# Patient Record
Sex: Female | Born: 1975
Health system: Southern US, Community
[De-identification: ages and names within clinical notes are randomized; demographics above are authoritative.]

## PROBLEM LIST (undated history)

## (undated) DIAGNOSIS — G709 Myoneural disorder, unspecified: Secondary | ICD-10-CM

## (undated) DIAGNOSIS — N926 Irregular menstruation, unspecified: Secondary | ICD-10-CM

## (undated) DIAGNOSIS — Z8489 Family history of other specified conditions: Secondary | ICD-10-CM

## (undated) DIAGNOSIS — E282 Polycystic ovarian syndrome: Secondary | ICD-10-CM

## (undated) DIAGNOSIS — IMO0002 Reserved for concepts with insufficient information to code with codable children: Secondary | ICD-10-CM

## (undated) DIAGNOSIS — E249 Cushing's syndrome, unspecified: Secondary | ICD-10-CM

## (undated) DIAGNOSIS — R000 Tachycardia, unspecified: Secondary | ICD-10-CM

## (undated) DIAGNOSIS — L03119 Cellulitis of unspecified part of limb: Secondary | ICD-10-CM

## (undated) DIAGNOSIS — F419 Anxiety disorder, unspecified: Secondary | ICD-10-CM

## (undated) DIAGNOSIS — C541 Malignant neoplasm of endometrium: Secondary | ICD-10-CM

## (undated) DIAGNOSIS — I10 Essential (primary) hypertension: Secondary | ICD-10-CM

## (undated) HISTORY — DX: Irregular menstruation, unspecified: N92.6

## (undated) HISTORY — DX: Cushing's syndrome, unspecified: E24.9

## (undated) HISTORY — DX: Polycystic ovarian syndrome: E28.2

## (undated) HISTORY — DX: Tachycardia, unspecified: R00.0

## (undated) HISTORY — DX: Reserved for concepts with insufficient information to code with codable children: IMO0002

## (undated) HISTORY — PX: DILATION AND CURETTAGE OF UTERUS: SHX78

## (undated) HISTORY — PX: ABDOMINAL HYSTERECTOMY: SHX81

## (undated) HISTORY — DX: Malignant neoplasm of endometrium: C54.1

---

## 1998-01-09 DIAGNOSIS — R87619 Unspecified abnormal cytological findings in specimens from cervix uteri: Secondary | ICD-10-CM

## 1998-01-09 DIAGNOSIS — IMO0002 Reserved for concepts with insufficient information to code with codable children: Secondary | ICD-10-CM

## 1998-01-09 HISTORY — DX: Reserved for concepts with insufficient information to code with codable children: IMO0002

## 1998-01-09 HISTORY — DX: Unspecified abnormal cytological findings in specimens from cervix uteri: R87.619

## 2000-07-16 ENCOUNTER — Other Ambulatory Visit: Admission: RE | Admit: 2000-07-16 | Discharge: 2000-07-16 | Payer: Self-pay | Admitting: Obstetrics and Gynecology

## 2001-09-02 ENCOUNTER — Other Ambulatory Visit: Admission: RE | Admit: 2001-09-02 | Discharge: 2001-09-02 | Payer: Self-pay | Admitting: Obstetrics and Gynecology

## 2001-10-09 ENCOUNTER — Emergency Department (HOSPITAL_COMMUNITY): Admission: EM | Admit: 2001-10-09 | Discharge: 2001-10-09 | Payer: Self-pay | Admitting: Emergency Medicine

## 2003-05-25 ENCOUNTER — Other Ambulatory Visit: Admission: RE | Admit: 2003-05-25 | Discharge: 2003-05-25 | Payer: Self-pay | Admitting: Obstetrics and Gynecology

## 2003-07-24 ENCOUNTER — Inpatient Hospital Stay (HOSPITAL_COMMUNITY): Admission: AD | Admit: 2003-07-24 | Discharge: 2003-07-28 | Payer: Self-pay | Admitting: Family Medicine

## 2003-08-03 ENCOUNTER — Encounter: Admission: RE | Admit: 2003-08-03 | Discharge: 2003-08-27 | Payer: Self-pay | Admitting: Family Medicine

## 2003-08-04 ENCOUNTER — Encounter: Admission: RE | Admit: 2003-08-04 | Discharge: 2003-08-04 | Payer: Self-pay | Admitting: Family Medicine

## 2003-08-05 ENCOUNTER — Encounter: Admission: RE | Admit: 2003-08-05 | Discharge: 2003-08-05 | Payer: Self-pay | Admitting: Family Medicine

## 2003-08-17 ENCOUNTER — Encounter: Admission: RE | Admit: 2003-08-17 | Discharge: 2003-08-17 | Payer: Self-pay | Admitting: Family Medicine

## 2003-09-09 ENCOUNTER — Encounter: Admission: RE | Admit: 2003-09-09 | Discharge: 2003-09-09 | Payer: Self-pay | Admitting: Family Medicine

## 2003-12-21 ENCOUNTER — Ambulatory Visit: Payer: Self-pay | Admitting: Family Medicine

## 2004-03-22 ENCOUNTER — Ambulatory Visit: Payer: Self-pay | Admitting: Family Medicine

## 2004-05-13 ENCOUNTER — Ambulatory Visit: Payer: Self-pay | Admitting: Family Medicine

## 2004-05-14 ENCOUNTER — Encounter (INDEPENDENT_AMBULATORY_CARE_PROVIDER_SITE_OTHER): Payer: Self-pay | Admitting: *Deleted

## 2004-12-21 ENCOUNTER — Ambulatory Visit: Payer: Self-pay | Admitting: Family Medicine

## 2005-05-15 ENCOUNTER — Ambulatory Visit: Payer: Self-pay | Admitting: Family Medicine

## 2006-03-08 DIAGNOSIS — J45909 Unspecified asthma, uncomplicated: Secondary | ICD-10-CM | POA: Insufficient documentation

## 2006-03-08 DIAGNOSIS — E282 Polycystic ovarian syndrome: Secondary | ICD-10-CM | POA: Insufficient documentation

## 2006-03-08 DIAGNOSIS — E669 Obesity, unspecified: Secondary | ICD-10-CM | POA: Insufficient documentation

## 2006-03-08 DIAGNOSIS — E739 Lactose intolerance, unspecified: Secondary | ICD-10-CM | POA: Insufficient documentation

## 2006-03-09 ENCOUNTER — Encounter (INDEPENDENT_AMBULATORY_CARE_PROVIDER_SITE_OTHER): Payer: Self-pay | Admitting: *Deleted

## 2009-01-22 ENCOUNTER — Emergency Department (HOSPITAL_COMMUNITY): Admission: EM | Admit: 2009-01-22 | Discharge: 2009-01-22 | Payer: Self-pay | Admitting: Emergency Medicine

## 2009-11-15 ENCOUNTER — Encounter (INDEPENDENT_AMBULATORY_CARE_PROVIDER_SITE_OTHER): Payer: Self-pay | Admitting: Internal Medicine

## 2009-11-15 ENCOUNTER — Inpatient Hospital Stay (HOSPITAL_COMMUNITY)
Admission: EM | Admit: 2009-11-15 | Discharge: 2009-11-20 | Payer: Self-pay | Source: Home / Self Care | Admitting: Emergency Medicine

## 2009-11-16 ENCOUNTER — Encounter (INDEPENDENT_AMBULATORY_CARE_PROVIDER_SITE_OTHER): Payer: Self-pay | Admitting: Internal Medicine

## 2009-11-16 ENCOUNTER — Ambulatory Visit: Payer: Self-pay | Admitting: Surgery

## 2010-03-22 LAB — CBC
HCT: 39.4 % (ref 36.0–46.0)
HCT: 40.1 % (ref 36.0–46.0)
HCT: 40.2 % (ref 36.0–46.0)
HCT: 42.6 % (ref 36.0–46.0)
Hemoglobin: 13 g/dL (ref 12.0–15.0)
Hemoglobin: 13 g/dL (ref 12.0–15.0)
Hemoglobin: 14.5 g/dL (ref 12.0–15.0)
Hemoglobin: 15.9 g/dL — ABNORMAL HIGH (ref 12.0–15.0)
MCHC: 32 g/dL (ref 30.0–36.0)
MCHC: 32.4 g/dL (ref 30.0–36.0)
MCHC: 32.7 g/dL (ref 30.0–36.0)
MCHC: 33.1 g/dL (ref 30.0–36.0)
MCHC: 33.8 g/dL (ref 30.0–36.0)
MCV: 100 fL (ref 78.0–100.0)
MCV: 100.3 fL — ABNORMAL HIGH (ref 78.0–100.0)
MCV: 99.8 fL (ref 78.0–100.0)
Platelets: 231 10*3/uL (ref 150–400)
Platelets: 238 10*3/uL (ref 150–400)
Platelets: 297 10*3/uL (ref 150–400)
Platelets: 326 10*3/uL (ref 150–400)
RBC: 4.03 MIL/uL (ref 3.87–5.11)
RDW: 13.1 % (ref 11.5–15.5)
RDW: 13.1 % (ref 11.5–15.5)
RDW: 13.2 % (ref 11.5–15.5)
RDW: 13.3 % (ref 11.5–15.5)
RDW: 13.4 % (ref 11.5–15.5)
WBC: 10.2 10*3/uL (ref 4.0–10.5)
WBC: 11 10*3/uL — ABNORMAL HIGH (ref 4.0–10.5)
WBC: 8.3 10*3/uL (ref 4.0–10.5)

## 2010-03-22 LAB — GLUCOSE, CAPILLARY
Glucose-Capillary: 180 mg/dL — ABNORMAL HIGH (ref 70–99)
Glucose-Capillary: 203 mg/dL — ABNORMAL HIGH (ref 70–99)
Glucose-Capillary: 210 mg/dL — ABNORMAL HIGH (ref 70–99)
Glucose-Capillary: 222 mg/dL — ABNORMAL HIGH (ref 70–99)
Glucose-Capillary: 232 mg/dL — ABNORMAL HIGH (ref 70–99)
Glucose-Capillary: 233 mg/dL — ABNORMAL HIGH (ref 70–99)
Glucose-Capillary: 234 mg/dL — ABNORMAL HIGH (ref 70–99)
Glucose-Capillary: 239 mg/dL — ABNORMAL HIGH (ref 70–99)
Glucose-Capillary: 284 mg/dL — ABNORMAL HIGH (ref 70–99)
Glucose-Capillary: 288 mg/dL — ABNORMAL HIGH (ref 70–99)

## 2010-03-22 LAB — CULTURE, BLOOD (ROUTINE X 2)
Culture  Setup Time: 201111072321
Culture  Setup Time: 201111072323
Culture  Setup Time: 201111100604
Culture: NO GROWTH

## 2010-03-22 LAB — BASIC METABOLIC PANEL
BUN: 5 mg/dL — ABNORMAL LOW (ref 6–23)
BUN: 6 mg/dL (ref 6–23)
BUN: 6 mg/dL (ref 6–23)
BUN: 6 mg/dL (ref 6–23)
CO2: 29 mEq/L (ref 19–32)
Calcium: 8.7 mg/dL (ref 8.4–10.5)
Calcium: 9.8 mg/dL (ref 8.4–10.5)
Chloride: 102 mEq/L (ref 96–112)
Creatinine, Ser: 0.64 mg/dL (ref 0.4–1.2)
Creatinine, Ser: 0.7 mg/dL (ref 0.4–1.2)
Creatinine, Ser: 0.73 mg/dL (ref 0.4–1.2)
GFR calc Af Amer: >60 mL/min (ref >60)
GFR calc Af Amer: >60 mL/min (ref >60)
GFR calc non Af Amer: >60 mL/min (ref >60)
GFR calc non Af Amer: >60 mL/min (ref >60)
GFR calc non Af Amer: >60 mL/min (ref >60)
GFR calc non Af Amer: >60 mL/min (ref >60)
Glucose, Bld: 229 mg/dL — ABNORMAL HIGH (ref 70–99)
Glucose, Bld: 251 mg/dL — ABNORMAL HIGH (ref 70–99)
Glucose, Bld: 258 mg/dL — ABNORMAL HIGH (ref 70–99)
Glucose, Bld: 379 mg/dL — ABNORMAL HIGH (ref 70–99)
Potassium: 4.2 mEq/L (ref 3.5–5.1)
Potassium: 4.3 mEq/L (ref 3.5–5.1)
Potassium: 4.8 mEq/L (ref 3.5–5.1)
Sodium: 134 mEq/L — ABNORMAL LOW (ref 135–145)
Sodium: 139 mEq/L (ref 135–145)

## 2010-03-22 LAB — HEMOGLOBIN A1C
Hgb A1c MFr Bld: 10.4 % — ABNORMAL HIGH (ref <5.7)
Mean Plasma Glucose: 252 mg/dL — ABNORMAL HIGH (ref <117)

## 2010-03-22 LAB — DIFFERENTIAL
Basophils Absolute: 0 10*3/uL (ref 0.0–0.1)
Basophils Relative: 0 % (ref 0–1)
Monocytes Relative: 7 % (ref 3–12)
Neutro Abs: 9 10*3/uL — ABNORMAL HIGH (ref 1.7–7.7)
Neutrophils Relative %: 79 % — ABNORMAL HIGH (ref 43–77)

## 2010-03-22 LAB — URINALYSIS, ROUTINE W REFLEX MICROSCOPIC
Bilirubin Urine: NEGATIVE
Glucose, UA: >1000 mg/dL — AB
Ketones, ur: NEGATIVE mg/dL
Leukocytes, UA: NEGATIVE
Nitrite: NEGATIVE
Specific Gravity, Urine: 1.014 (ref 1.005–1.030)
pH: 6 (ref 5.0–8.0)

## 2010-03-22 LAB — HEPATIC FUNCTION PANEL
Bilirubin, Direct: <0.1 mg/dL (ref 0.0–0.3)
Total Bilirubin: 0.4 mg/dL (ref 0.3–1.2)

## 2010-03-22 LAB — URINE MICROSCOPIC-ADD ON

## 2010-03-22 LAB — SEDIMENTATION RATE: Sed Rate: 80 mm/hr — ABNORMAL HIGH (ref 0–22)

## 2010-03-22 LAB — LIPID PANEL
LDL Cholesterol: 123 mg/dL — ABNORMAL HIGH (ref 0–99)
Triglycerides: 231 mg/dL — ABNORMAL HIGH (ref <150)
VLDL: 46 mg/dL — ABNORMAL HIGH (ref 0–40)

## 2010-03-27 LAB — BASIC METABOLIC PANEL
Calcium: 9.4 mg/dL (ref 8.4–10.5)
Creatinine, Ser: 0.79 mg/dL (ref 0.4–1.2)
GFR calc Af Amer: >60 mL/min (ref >60)
GFR calc non Af Amer: >60 mL/min (ref >60)

## 2010-05-27 NOTE — Discharge Summary (Signed)
NAME:  Kelli Allen, NIEMAN NO.:  [id number]   MEDICAL RECORD NO.:  [id number]                   PATIENT TYPE:  INP   LOCATION:  5011                                 FACILITY:  [facility]   PHYSICIAN:  Ace Gins, MD                  DATE OF BIRTH:  20-Jul-1975   DATE OF ADMISSION:  07/24/2003  DATE OF DISCHARGE:  07/28/2003                                 DISCHARGE SUMMARY   Dictated by Towana Badger, acting intern for Dr. Larina Bras, [facility].   DISCHARGE DIAGNOSES:  1. Right lower extremity cellulitis.  2. Non-insulin-dependent diabetes mellitus.   DISCHARGE MEDICATIONS:  1. Augmentin 875 mg/125 mg p.o. q.12h. with food X7 days.  2. Metformin 500 mg p.o. twice a day.  3. Enalapril 5 mg p.o. daily.   FOLLOW UP:  1. Appointment scheduled with Dr. Larina Bras in the family medicine clinic for     2:00 P.M. on August 05, 2003.  2. Evaluation of right lower extremity cellulitis.  3. Diabetes education for newly diagnosed non-insulin-dependent diabetes     mellitus.   HOSPITAL COURSE:  This patient is a 35 year old white lady who presented to  the emergency room with right lower extremity redness and swelling x3 days.  She was diagnosed with cellulitis and treated with intravenous amoxicillin.  Incidental to this admission was the finding of elevated blood glucose.  In  discussion with the patient, it was discovered that the patient had a prior  diagnosis of pre-diabetes with recommendations from her primary medical  doctor one year ago to begin oral hypoglycemics.  At this time the patient  was started on metformin oral therapy and put on an insulin sliding scale  for coverage while in the hospital.  The patient showed improvement in the  extent of erythema and the degree of pain surrounding her right lower  extremity lesion and was changed over to p.o. antibiotics for discharge and  follow up with the family medicine clinic.  The  patient was discharged  afebrile and hemodynamically stable with no complaints.   DISCHARGE LABORATORY DATA:  CBC:  White blood cell count 7.1, hemoglobin  13.2, hematocrit 38.4, platelet count 292,000.  BMET:  Sodium 138, potassium  4.0, chloride 107, cO2 25, BUN 8, creatinine 0.8, glucose 113, calcium 9.2.   DISCHARGE INSTRUCTIONS:  1. Medications:  See discharge medications above.  2. Pain management:  Over the counter Tylenol or ibuprofen as needed, per     instructions on bottle.  3. Activity:  The patient was recommended to ambulate as able.  She may     return to work after several days of rest and recuperation, as able.  4. Diet:  Diet per diabetes educator.  5. Wound care:  No scratching or excessive manipulation of the skin.     Elevate leg if swelling increases.  6. Special instructions:  None.  7. Follow  up:  As noted above, patient has a follow up appointment with Dr.     Larina Bras for August 05, 2003 at 2:00 P.M. here at the family  medicine clinic.     Patient was further instructed to return to the emergency department if     new onset fever or increased pain, swelling or redness of the affected     limb.      Rodolph Bong, M.D.                        Ace Gins, MD    AK/MEDQ  D:  07/28/2003  T:  07/28/2003  Job:  [id number]

## 2010-10-11 ENCOUNTER — Emergency Department (HOSPITAL_BASED_OUTPATIENT_CLINIC_OR_DEPARTMENT_OTHER)
Admission: EM | Admit: 2010-10-11 | Discharge: 2010-10-11 | Disposition: A | Discharge status: Home / Self Care | Payer: Managed Care, Other (non HMO) | Attending: Emergency Medicine | Admitting: Emergency Medicine

## 2010-10-11 ENCOUNTER — Encounter: Payer: Self-pay | Admitting: *Deleted

## 2010-10-11 DIAGNOSIS — E119 Type 2 diabetes mellitus without complications: Secondary | ICD-10-CM | POA: Insufficient documentation

## 2010-10-11 DIAGNOSIS — N898 Other specified noninflammatory disorders of vagina: Secondary | ICD-10-CM | POA: Insufficient documentation

## 2010-10-11 DIAGNOSIS — I10 Essential (primary) hypertension: Secondary | ICD-10-CM

## 2010-10-11 DIAGNOSIS — N939 Abnormal uterine and vaginal bleeding, unspecified: Secondary | ICD-10-CM

## 2010-10-11 DIAGNOSIS — Z794 Long term (current) use of insulin: Secondary | ICD-10-CM | POA: Insufficient documentation

## 2010-10-11 DIAGNOSIS — R000 Tachycardia, unspecified: Secondary | ICD-10-CM | POA: Insufficient documentation

## 2010-10-11 LAB — URINALYSIS, ROUTINE W REFLEX MICROSCOPIC
Bilirubin Urine: NEGATIVE
Glucose, UA: >1000 mg/dL — AB
Hgb urine dipstick: NEGATIVE
Ketones, ur: 15 mg/dL — AB
Leukocytes, UA: NEGATIVE
Nitrite: NEGATIVE
Protein, ur: NEGATIVE mg/dL
Specific Gravity, Urine: 1.031 — ABNORMAL HIGH (ref 1.005–1.030)
Urobilinogen, UA: 0.2 mg/dL (ref 0.0–1.0)
pH: 6 (ref 5.0–8.0)

## 2010-10-11 LAB — CBC
HCT: 43.5 % (ref 36.0–46.0)
Hemoglobin: 14.7 g/dL (ref 12.0–15.0)
MCH: 32.7 pg (ref 26.0–34.0)
MCHC: 33.8 g/dL (ref 30.0–36.0)
MCV: 96.9 fL (ref 78.0–100.0)
RDW: 13.7 % (ref 11.5–15.5)

## 2010-10-11 LAB — WET PREP, GENITAL
Trich, Wet Prep: NONE SEEN
Yeast Wet Prep HPF POC: NONE SEEN

## 2010-10-11 LAB — URINE MICROSCOPIC-ADD ON

## 2010-10-11 LAB — PREGNANCY, URINE: Preg Test, Ur: NEGATIVE

## 2010-10-11 LAB — GLUCOSE, CAPILLARY

## 2010-10-11 NOTE — ED Notes (Signed)
Called to triage by registration for family complaint.

## 2010-10-11 NOTE — ED Provider Notes (Signed)
History     CSN: [id number] Arrival date & time: 10/11/2010  8:41 PM  Chief Complaint  Patient presents with  . Vaginal Bleeding    (Consider location/radiation/quality/duration/timing/severity/associated sxs/prior treatment) HPI Comments: Pt states that she has heavy bleeding for a week that ended 2 days ago:pt states that she had clots and tissue and she was talking to some friends tonight and she became worried that she may have misscarried:pt states that she has been seen having problems with her bp and heart rate and she was put on procardia last week and she is to see them again next week  Patient is a 35 y.o. female presenting with vaginal bleeding. The history is provided by the patient. No language interpreter was used.  Vaginal Bleeding This is a new problem. The current episode started in the past 7 days. The problem has been resolved. Pertinent negatives include no abdominal pain, fever or urinary symptoms. The symptoms are aggravated by nothing. She has tried nothing for the symptoms.    Past Medical History  Diagnosis Date  . Diabetes mellitus   . Polycystic ovarian syndrome     History reviewed. No pertinent past surgical history.  No family history on file.  History  Substance Use Topics  . Smoking status: Never Smoker   . Smokeless tobacco: Not on file  . Alcohol Use: No    OB History    Grav Para Term Preterm Abortions TAB SAB Ect Mult Living                  Review of Systems  Constitutional: Negative for fever.  Gastrointestinal: Negative for abdominal pain.  Genitourinary: Positive for vaginal bleeding.  All other systems reviewed and are negative.    Allergies  Review of patient's allergies indicates not on file.  Home Medications   Current Outpatient Rx  Name Route Sig Dispense Refill  . INSULIN ASPART PROT & ASPART (70-30) 100 UNIT/ML [place] SUSP Subcutaneous Inject into the skin daily with breakfast.      . INSULIN GLARGINE 100 UNIT/ML [place]  SOLN Subcutaneous Inject 55 Units into the skin at bedtime.      Marland Kitchen METFORMIN HCL 500 MG (OSM) PO TB24 Oral Take 500 mg by mouth 2 (two) times daily with a meal.      . NIFEDIPINE CR OSMOTIC 60 MG PO TB24 Oral Take 60 mg by mouth daily.        BP 154/87  Pulse 120  Temp(Src) 98.4 F (36.9 C) (Oral)  Resp 20  SpO2 100%  Physical Exam  Nursing note and vitals reviewed. Constitutional: She is oriented to person, place, and time. She appears well-developed and well-nourished.  HENT:  Head: Normocephalic and atraumatic.  Eyes: Pupils are equal, round, and reactive to light.  Neck: Normal range of motion.  Cardiovascular: Normal rate and regular rhythm.   Pulmonary/Chest: Effort normal and breath sounds normal.  Abdominal: Soft. Bowel sounds are normal.  Musculoskeletal: Normal range of motion.  Neurological: She is alert and oriented to person, place, and time.  Skin: Skin is warm and dry.  Psychiatric: She has a normal mood and affect.    ED Course  Procedures (including critical care time)  Labs Reviewed  URINALYSIS, ROUTINE W REFLEX MICROSCOPIC - Abnormal; Notable for the following:    Specific Gravity, Urine 1.031 (*)    Glucose, UA >1000 (*)    Ketones, ur 15 (*)    All other components within normal limits  WET PREP,  GENITAL - Abnormal; Notable for the following:    Clue Cells, Wet Prep MODERATE (*)    WBC, Wet Prep HPF POC MANY (*)    All other components within normal limits  GLUCOSE, CAPILLARY - Abnormal; Notable for the following:    Glucose-Capillary 265 (*)    All other components within normal limits  CBC - Abnormal; Notable for the following:    WBC 11.9 (*)    All other components within normal limits  PREGNANCY, URINE  URINE MICROSCOPIC-ADD ON  GC/CHLAMYDIA PROBE AMP, GENITAL  POCT CBG MONITORING   No results found.   No diagnosis found.    MDM  Pt heart rate has been this at baseline per pt and she has been on the procardia and is to be rechecked  by pcp next week:discussed with pt that there was no way to know if she miscarried with certainty but her pregnancy test is negative and that means that even if she did the products have cleared        Teressa Lower, NP 10/18/10 1347

## 2010-10-11 NOTE — ED Notes (Signed)
Pt. 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 Was passing "tissue" off and on with her more heavier than normal period.  Pt.reports the NSG instructor told the students/friends to bring her to the ED immediately.  Pt. In no distress and taking her meds as ordered.

## 2010-10-11 NOTE — ED Notes (Signed)
Pt. In no distress.  Pt. Vitals retaken to assure the Heart rate and B/P are WNL.  NO noted shortness of breath in Pt.  Pt. Reports to RN she has been on a trip over the wk end.  Pt. Reports her

## 2010-10-11 NOTE — ED Notes (Signed)
States she thinks she is having a miscarriage. Has not had a positive pregnancy test. Heavy vaginal bleeding x 1 week. Stopped yesterday.

## 2010-10-11 NOTE — ED Notes (Signed)
Pt. Has noted edema look to eyelids bilat.  Pt. Also has large face with noted edematous look

## 2010-10-13 LAB — GC/CHLAMYDIA PROBE AMP, GENITAL
Chlamydia, DNA Probe: NEGATIVE
GC Probe Amp, Genital: NEGATIVE

## 2010-10-20 NOTE — ED Provider Notes (Signed)
Medical screening examination/treatment/procedure(s) were performed by non-physician practitioner and as supervising physician I was immediately available for consultation/collaboration.  Cyndra Numbers, MD 10/20/10 [phone]

## 2011-02-25 ENCOUNTER — Emergency Department (HOSPITAL_BASED_OUTPATIENT_CLINIC_OR_DEPARTMENT_OTHER)
Admission: EM | Admit: 2011-02-25 | Discharge: 2011-02-25 | Disposition: A | Discharge status: Home / Self Care | Payer: PRIVATE HEALTH INSURANCE | Source: Home / Self Care | Attending: Emergency Medicine | Admitting: Emergency Medicine

## 2011-02-25 ENCOUNTER — Encounter (HOSPITAL_BASED_OUTPATIENT_CLINIC_OR_DEPARTMENT_OTHER): Payer: Self-pay | Admitting: *Deleted

## 2011-02-25 DIAGNOSIS — E669 Obesity, unspecified: Secondary | ICD-10-CM | POA: Insufficient documentation

## 2011-02-25 DIAGNOSIS — Z9119 Patient's noncompliance with other medical treatment and regimen: Secondary | ICD-10-CM | POA: Insufficient documentation

## 2011-02-25 DIAGNOSIS — L02419 Cutaneous abscess of limb, unspecified: Secondary | ICD-10-CM | POA: Insufficient documentation

## 2011-02-25 DIAGNOSIS — Z79899 Other long term (current) drug therapy: Secondary | ICD-10-CM | POA: Insufficient documentation

## 2011-02-25 DIAGNOSIS — Z91199 Patient's noncompliance with other medical treatment and regimen due to unspecified reason: Secondary | ICD-10-CM | POA: Insufficient documentation

## 2011-02-25 DIAGNOSIS — L0390 Cellulitis, unspecified: Secondary | ICD-10-CM

## 2011-02-25 DIAGNOSIS — E119 Type 2 diabetes mellitus without complications: Secondary | ICD-10-CM | POA: Insufficient documentation

## 2011-02-25 HISTORY — DX: Cellulitis of unspecified part of limb: L03.119

## 2011-02-25 LAB — URINE MICROSCOPIC-ADD ON

## 2011-02-25 LAB — URINALYSIS, ROUTINE W REFLEX MICROSCOPIC
Bilirubin Urine: NEGATIVE
Glucose, UA: >1000 mg/dL — AB
Hgb urine dipstick: NEGATIVE
Ketones, ur: 15 mg/dL — AB
Protein, ur: NEGATIVE mg/dL
Urobilinogen, UA: 0.2 mg/dL (ref 0.0–1.0)

## 2011-02-25 LAB — BASIC METABOLIC PANEL
CO2: 26 mEq/L (ref 19–32)
Chloride: 95 mEq/L — ABNORMAL LOW (ref 96–112)
Creatinine, Ser: 0.7 mg/dL (ref 0.50–1.10)
GFR calc Af Amer: >90 mL/min (ref >90)
Sodium: 132 mEq/L — ABNORMAL LOW (ref 135–145)

## 2011-02-25 LAB — PREGNANCY, URINE: Preg Test, Ur: NEGATIVE

## 2011-02-25 LAB — GLUCOSE, CAPILLARY: Glucose-Capillary: 279 mg/dL — ABNORMAL HIGH (ref 70–99)

## 2011-02-25 MED ORDER — SODIUM CHLORIDE 0.9 % IV BOLUS (SEPSIS)
1000.0000 mL | Freq: Once | INTRAVENOUS | Status: AC
  Administered 2011-02-25: 1000 mL via INTRAVENOUS

## 2011-02-25 MED ORDER — VANCOMYCIN HCL IN DEXTROSE 1-5 GM/200ML-% IV SOLN
1000.0000 mg | Freq: Once | INTRAVENOUS | Status: AC
  Administered 2011-02-25: 1000 mg via INTRAVENOUS
  Filled 2011-02-25: qty 200

## 2011-02-25 MED ORDER — MORPHINE SULFATE 4 MG/ML IJ SOLN
4.0000 mg | Freq: Once | INTRAMUSCULAR | Status: AC
  Administered 2011-02-25: 4 mg via INTRAVENOUS
  Filled 2011-02-25: qty 1

## 2011-02-25 MED ORDER — PIPERACILLIN-TAZOBACTAM 3.375 G IVPB
3.3750 g | Freq: Once | INTRAVENOUS | Status: AC
  Administered 2011-02-25: 3.375 g via INTRAVENOUS

## 2011-02-25 MED ORDER — AMOXICILLIN-POT CLAVULANATE 875-125 MG PO TABS: 1.0000 | ORAL_TABLET | Freq: Two times a day (BID) | ORAL | Status: DC

## 2011-02-25 MED ORDER — HYDROCODONE-ACETAMINOPHEN 5-325 MG PO TABS: 2.0000 | ORAL_TABLET | ORAL | Status: AC | PRN

## 2011-02-25 MED ORDER — ACETAMINOPHEN 500 MG PO TABS
1000.0000 mg | ORAL_TABLET | Freq: Once | ORAL | Status: AC
  Administered 2011-02-25: 1000 mg via ORAL
  Filled 2011-02-25: qty 2

## 2011-02-25 MED ORDER — PIPERACILLIN-TAZOBACTAM 3.375 G IVPB
3.3750 g | Freq: Once | INTRAVENOUS | Status: DC
  Filled 2011-02-25: qty 50

## 2011-02-25 NOTE — ED Notes (Signed)
Pt states she has a hx of cellulitis and "knows this is the same". Past hx of virus which led to vomiting which led to 4 day hospitalization with cellulitis. "sugar up d/t not taking Lantus for 4 days-unable to get" 256 this a.m. Right lower leg red and warm to touch.

## 2011-02-25 NOTE — ED Notes (Signed)
Dr Ethelda Chick to reassess the patient.

## 2011-02-25 NOTE — ED Notes (Signed)
MD at bedside. 

## 2011-02-25 NOTE — ED Notes (Signed)
Skin marked to show area of cellulitis inflammation

## 2011-02-25 NOTE — ED Provider Notes (Signed)
History    Scribed for Doug Sou, MD, the patient was seen in room MH06/MH06. This chart was scribed by Katha Cabal.   CSN: [id number]  Arrival date & time 02/25/11  1604   First MD Initiated Contact with Patient 02/25/11 1807      Chief Complaint  Patient presents with  . Leg Pain    (Consider location/radiation/quality/duration/timing/severity/associated sxs/prior treatment) HPI Kelli Allen is a 36 y.o. female who presents to the Emergency Department complaining of acute onset of right lower extremity cellulitis that is gradual worsening.  Patient reports tenderness down the leg. Erythema began about 2 PM this afternoon around toes.  Pain rated 5/10.   Pain described as stinging.  Symptoms are associated with chills.  Symptoms are not associated with fever.    Patient states she was hurting in groin earlier today.  ASA taken for pain.  Patient reports similar symptoms with prior cellulitis episodes.  Patient with diabetes mellitus but missed [place] dosage the last 4 days because she didn't go pick up medication.  Patient taking Metformin and beta blocker.   Patient reports blood sugar was 256 this morning and has been running about 160 fasting.     Past Medical History  Diagnosis Date  . Diabetes mellitus   . Polycystic ovarian syndrome   . Cellulitis of lower leg     History reviewed. No pertinent past surgical history.  History reviewed. No pertinent family history.  History  Substance Use Topics  . Smoking status: Never Smoker   . Smokeless tobacco: Not on file  . Alcohol Use: Yes, occasionally     OB History    Grav Para Term Preterm Abortions TAB SAB Ect Mult Living                  Review of Systems  Constitutional: Negative.   HENT: Negative.   Respiratory: Negative.   Cardiovascular: Negative.   Gastrointestinal: Negative.   Musculoskeletal: Negative.        Leg pain   Skin: Negative.   Neurological: Negative.   Hematological: Negative.     Psychiatric/Behavioral: Negative.     Allergies  Review of patient's allergies indicates no known allergies.  Home Medications   Current Outpatient Rx  Name Route Sig Dispense Refill  . INSULIN ASPART PROT & ASPART (70-30) 100 UNIT/ML [place] SUSP Subcutaneous Inject 30 Units into the skin daily with breakfast.     . INSULIN GLARGINE 100 UNIT/ML [place] SOLN Subcutaneous Inject 60 Units into the skin at bedtime.     Marland Kitchen LABETALOL HCL 200 MG PO TABS Oral Take 200 mg by mouth 2 (two) times daily.    Marland Kitchen METFORMIN HCL ER (OSM) 500 MG PO TB24 Oral Take 500 mg by mouth 2 (two) times daily with a meal.      . OVER THE COUNTER MEDICATION Oral Take 1 packet by mouth daily. Nutralite Vitamins      BP 146/63  Pulse 124  Temp(Src) 101.2 F (38.4 C) (Oral)  Resp 19  Ht 5\' 5"  (1.651 m)  Wt 290 lb (131.543 kg)  BMI 48.26 kg/m2  SpO2 96%  LMP 02/24/2011  Physical Exam  Nursing note and vitals reviewed. Constitutional: She appears well-developed and well-nourished.  HENT:  Head: Normocephalic and atraumatic.  Mouth/Throat: Mucous membranes are dry.  Eyes: Conjunctivae are normal. Pupils are equal, round, and reactive to light.  Neck: Neck supple. No tracheal deviation present. No thyromegaly present.  Cardiovascular: Regular rhythm.  Tachycardia present.  No murmur heard. Pulmonary/Chest: Effort normal and breath sounds normal. No respiratory distress. She has no wheezes. She has no rales.  Abdominal: Soft. Bowel sounds are normal. She exhibits no distension. There is no tenderness.       obese  Musculoskeletal: Normal range of motion. She exhibits no edema and no tenderness.       Right lower extremity has 20 cm area of redness and warmth, stocking glove fashion around calf and shin, thigh is minimally tender  Neurological: She is alert. Coordination normal.  Skin: Skin is warm and dry. No rash noted.  Psychiatric: She has a normal mood and affect.    ED Course  Procedures (including critical  care time)   DIAGNOSTIC STUDIES: Oxygen Saturation is 97% on room air, normal by my interpretation.     COORDINATION OF CARE: 6:20 PM  Physical exam complete.   6:30 PM  Vancomycin, Zosyn, Morphine and IV fluids.   6:45 PM  IV fluid bolus.  9:30 PM  Recheck.  Patient is feeling better.  Redness at has not worsened.  Plan to discharge patient.  Patient agrees with plan.       LABS / RADIOLOGY:   Labs Reviewed  URINALYSIS, ROUTINE W REFLEX MICROSCOPIC - Abnormal; Notable for the following:    Glucose, UA >1000 (*)    Ketones, ur 15 (*)    All other components within normal limits  GLUCOSE, CAPILLARY - Abnormal; Notable for the following:    Glucose-Capillary 349 (*)    All other components within normal limits  BASIC METABOLIC PANEL - Abnormal; Notable for the following:    Sodium 132 (*)    Chloride 95 (*)    Glucose, Bld 333 (*)    Calcium 10.7 (*)    All other components within normal limits  GLUCOSE, CAPILLARY - Abnormal; Notable for the following:    Glucose-Capillary 279 (*)    All other components within normal limits  PREGNANCY, URINE  URINE MICROSCOPIC-ADD ON   No results found.   No diagnosis found.    MDM  Note elevated pulse is baseline for this patient she reports that she has a resting tachycardia at baseline. other contributing factor likely is a fever. Patient reports she has done well with Augmentin in the past for cellulitis. She will get her prescription for insulin fell tonight and begin to take it tonight.. Plan patient to return tomorrow for reexamination. If condition worsens we'll hospitalize Diagnosis #1 cellulitis of right leg #2 hyperglycemia #3 medication noncompliance   I personally performed the services described in this documentation, which was scribed in my presence. The recorded information has been reviewed and considered.  Doug Sou, MD 02/25/11 2139

## 2011-02-25 NOTE — ED Notes (Signed)
I completed CBG check and got reading of 279 mg./dcltr.

## 2011-02-25 NOTE — Discharge Instructions (Signed)
Cellulitis Cellulitis is an infection of the tissue under the skin. The infected area is usually red and tender. This is caused by germs. These germs enter the body through cuts or sores. This usually happens in the arms or lower legs. HOME CARE   Take your medicine as told. Finish it even if you start to feel better.   If the infection is on the arm or leg, keep it raised (elevated).   Use a warm cloth on the infected area several times a day.   See your doctor for a follow-up visit as told.  GET HELP RIGHT AWAY IF:   You are tired or confused.   You throw up (vomit).   You have watery poop (diarrhea).   You feel ill and have muscle aches.   You have a fever.  MAKE SURE YOU:   Understand these instructions.   Will watch your condition.   Will get help right away if you are not doing well or get worse.  Document Released: 06/14/2007 Document Revised: 09/07/2010 Document Reviewed: 11/27/2008 [facility] Patient Information 2012 [place], [place].  Return tomorrow for reexamination. Take Tylenol for mild pain or pain the medication prescribed for bad pain. Return sooner if your condition worsens for any reason. Take your insulin as prescribed

## 2011-02-26 ENCOUNTER — Encounter (HOSPITAL_BASED_OUTPATIENT_CLINIC_OR_DEPARTMENT_OTHER): Payer: Self-pay | Admitting: *Deleted

## 2011-02-26 ENCOUNTER — Inpatient Hospital Stay (HOSPITAL_BASED_OUTPATIENT_CLINIC_OR_DEPARTMENT_OTHER)
Admission: EM | Admit: 2011-02-26 | Discharge: 2011-03-02 | DRG: 603 | Disposition: A | Discharge status: Home / Self Care | Payer: PRIVATE HEALTH INSURANCE | Attending: Internal Medicine | Admitting: Internal Medicine

## 2011-02-26 DIAGNOSIS — Z6841 Body Mass Index (BMI) 40.0 and over, adult: Secondary | ICD-10-CM

## 2011-02-26 DIAGNOSIS — E282 Polycystic ovarian syndrome: Secondary | ICD-10-CM | POA: Diagnosis present

## 2011-02-26 DIAGNOSIS — I10 Essential (primary) hypertension: Secondary | ICD-10-CM | POA: Diagnosis present

## 2011-02-26 DIAGNOSIS — L259 Unspecified contact dermatitis, unspecified cause: Secondary | ICD-10-CM | POA: Diagnosis present

## 2011-02-26 DIAGNOSIS — Z79899 Other long term (current) drug therapy: Secondary | ICD-10-CM

## 2011-02-26 DIAGNOSIS — IMO0001 Reserved for inherently not codable concepts without codable children: Secondary | ICD-10-CM | POA: Diagnosis present

## 2011-02-26 DIAGNOSIS — Z794 Long term (current) use of insulin: Secondary | ICD-10-CM

## 2011-02-26 DIAGNOSIS — L03119 Cellulitis of unspecified part of limb: Secondary | ICD-10-CM

## 2011-02-26 DIAGNOSIS — L02419 Cutaneous abscess of limb, unspecified: Principal | ICD-10-CM | POA: Diagnosis present

## 2011-02-26 DIAGNOSIS — E119 Type 2 diabetes mellitus without complications: Secondary | ICD-10-CM | POA: Diagnosis present

## 2011-02-26 DIAGNOSIS — J45909 Unspecified asthma, uncomplicated: Secondary | ICD-10-CM | POA: Diagnosis present

## 2011-02-26 DIAGNOSIS — E782 Mixed hyperlipidemia: Secondary | ICD-10-CM | POA: Diagnosis present

## 2011-02-26 HISTORY — DX: Essential (primary) hypertension: I10

## 2011-02-26 LAB — GLUCOSE, CAPILLARY: Glucose-Capillary: 245 mg/dL — ABNORMAL HIGH (ref 70–99)

## 2011-02-26 LAB — BASIC METABOLIC PANEL
BUN: 10 mg/dL (ref 6–23)
Chloride: 100 mEq/L (ref 96–112)
GFR calc Af Amer: >90 mL/min (ref >90)
GFR calc non Af Amer: >90 mL/min (ref >90)
Potassium: 4.1 mEq/L (ref 3.5–5.1)
Sodium: 136 mEq/L (ref 135–145)

## 2011-02-26 LAB — DIFFERENTIAL
Band Neutrophils: 8 % (ref 0–10)
Basophils Absolute: 0 10*3/uL (ref 0.0–0.1)
Basophils Relative: 0 % (ref 0–1)
Eosinophils Absolute: 0 10*3/uL (ref 0.0–0.7)
Eosinophils Relative: 0 % (ref 0–5)
Metamyelocytes Relative: 0 %
Myelocytes: 0 %
Neutro Abs: 15 10*3/uL — ABNORMAL HIGH (ref 1.7–7.7)
Neutrophils Relative %: 84 % — ABNORMAL HIGH (ref 43–77)
Promyelocytes Absolute: 0 %

## 2011-02-26 LAB — CBC
Hemoglobin: 14 g/dL (ref 12.0–15.0)
MCH: 33.2 pg (ref 26.0–34.0)
MCHC: 33.7 g/dL (ref 30.0–36.0)
MCV: 98.3 fL (ref 78.0–100.0)
RBC: 4.22 MIL/uL (ref 3.87–5.11)

## 2011-02-26 MED ORDER — PIPERACILLIN-TAZOBACTAM 3.375 G IVPB
3.3750 g | Freq: Once | INTRAVENOUS | Status: AC
  Administered 2011-02-26: 3.375 g via INTRAVENOUS
  Filled 2011-02-26: qty 50

## 2011-02-26 MED ORDER — SODIUM CHLORIDE 0.9 % IV SOLN
1000.0000 mL | INTRAVENOUS | Status: DC
  Administered 2011-02-26: 1000 mL via INTRAVENOUS

## 2011-02-26 MED ORDER — ONDANSETRON HCL 4 MG/2ML IJ SOLN
4.0000 mg | Freq: Once | INTRAMUSCULAR | Status: DC
  Filled 2011-02-26: qty 2

## 2011-02-26 MED ORDER — ACETAMINOPHEN 325 MG PO TABS: 650.0000 mg | ORAL_TABLET | Freq: Four times a day (QID) | ORAL | Status: DC | PRN

## 2011-02-26 MED ORDER — SODIUM CHLORIDE 0.9 % IV SOLN
INTRAVENOUS | Status: DC
  Administered 2011-02-27: 12:00:00 via INTRAVENOUS

## 2011-02-26 MED ORDER — INSULIN ASPART PROT & ASPART (70-30 MIX) 100 UNIT/ML ~~LOC~~ SUSP
30.0000 [IU] | Freq: Every day | SUBCUTANEOUS | Status: DC
  Administered 2011-02-27 – 2011-02-28 (×2): 30 [IU] via SUBCUTANEOUS
  Filled 2011-02-26: qty 3

## 2011-02-26 MED ORDER — ACETAMINOPHEN 325 MG PO TABS
650.0000 mg | ORAL_TABLET | Freq: Four times a day (QID) | ORAL | Status: DC | PRN
  Administered 2011-02-26: 650 mg via ORAL
  Filled 2011-02-26: qty 2

## 2011-02-26 MED ORDER — HYDROMORPHONE HCL PF 1 MG/ML IJ SOLN: 0.5000 mg | INTRAMUSCULAR | Status: DC | PRN

## 2011-02-26 MED ORDER — ENOXAPARIN SODIUM 40 MG/0.4ML ~~LOC~~ SOLN
40.0000 mg | SUBCUTANEOUS | Status: DC
Start: 2011-02-27 — End: 2011-03-02
  Administered 2011-02-27 – 2011-03-02 (×4): 40 mg via SUBCUTANEOUS
  Filled 2011-02-26 (×4): qty 0.4

## 2011-02-26 MED ORDER — VANCOMYCIN HCL IN DEXTROSE 1-5 GM/200ML-% IV SOLN
1000.0000 mg | Freq: Once | INTRAVENOUS | Status: AC
  Administered 2011-02-26: 1000 mg via INTRAVENOUS
  Filled 2011-02-26: qty 200

## 2011-02-26 MED ORDER — HYDRALAZINE HCL 20 MG/ML IJ SOLN
10.0000 mg | INTRAMUSCULAR | Status: DC | PRN
  Filled 2011-02-26: qty 0.5

## 2011-02-26 MED ORDER — ONDANSETRON HCL 4 MG/2ML IJ SOLN: 4.0000 mg | Freq: Four times a day (QID) | INTRAMUSCULAR | Status: DC | PRN

## 2011-02-26 MED ORDER — LABETALOL HCL 200 MG PO TABS
200.0000 mg | ORAL_TABLET | Freq: Two times a day (BID) | ORAL | Status: DC
  Administered 2011-02-27 – 2011-03-02 (×8): 200 mg via ORAL
  Filled 2011-02-26 (×9): qty 1

## 2011-02-26 MED ORDER — HYDROCODONE-ACETAMINOPHEN 5-325 MG PO TABS
2.0000 | ORAL_TABLET | ORAL | Status: DC | PRN
  Administered 2011-02-27 – 2011-03-01 (×9): 2 via ORAL
  Administered 2011-03-01 – 2011-03-02 (×3): 1 via ORAL
  Filled 2011-02-26 (×3): qty 2
  Filled 2011-02-26: qty 1
  Filled 2011-02-26 (×5): qty 2
  Filled 2011-02-26 (×2): qty 1
  Filled 2011-02-26: qty 2

## 2011-02-26 MED ORDER — OXYCODONE-ACETAMINOPHEN 5-325 MG PO TABS
2.0000 | ORAL_TABLET | Freq: Once | ORAL | Status: AC
  Administered 2011-02-26: 2 via ORAL
  Filled 2011-02-26: qty 2

## 2011-02-26 MED ORDER — ACETAMINOPHEN 650 MG RE SUPP: 650.0000 mg | Freq: Four times a day (QID) | RECTAL | Status: DC | PRN

## 2011-02-26 MED ORDER — INSULIN ASPART 100 UNIT/ML ~~LOC~~ SOLN
0.0000 [IU] | Freq: Three times a day (TID) | SUBCUTANEOUS | Status: DC
  Filled 2011-02-26: qty 3

## 2011-02-26 MED ORDER — INSULIN GLARGINE 100 UNIT/ML ~~LOC~~ SOLN
60.0000 [IU] | Freq: Every day | SUBCUTANEOUS | Status: DC
  Filled 2011-02-26: qty 3

## 2011-02-26 MED ORDER — SODIUM CHLORIDE 0.9 % IV BOLUS (SEPSIS)
1000.0000 mL | Freq: Once | INTRAVENOUS | Status: AC
  Administered 2011-02-26: 1000 mL via INTRAVENOUS

## 2011-02-26 MED ORDER — ONDANSETRON HCL 4 MG PO TABS: 4.0000 mg | ORAL_TABLET | Freq: Four times a day (QID) | ORAL | Status: DC | PRN

## 2011-02-26 MED ORDER — HYDROMORPHONE HCL PF 1 MG/ML IJ SOLN: 1.0000 mg | INTRAMUSCULAR | Status: DC | PRN

## 2011-02-26 NOTE — ED Notes (Signed)
The patient's CBG was 245. 

## 2011-02-26 NOTE — ED Provider Notes (Signed)
Presents for recheck of cellulitis of right leg. Patient seen here yesterday received treatment with vancomycin and Zosyn. Prescription for Augmentin which he had filled she presents today as leg is more reddened and reddened area has spread beyond yesterday  Kelli Sou, MD 02/26/11 1720

## 2011-02-26 NOTE — ED Provider Notes (Signed)
History     CSN: [id number]  Arrival date & time 02/26/11  1520   First MD Initiated Contact with Patient 02/26/11 1605      Chief Complaint  Patient presents with  . Wound Check    (Consider location/radiation/quality/duration/timing/severity/associated sxs/prior treatment) Patient is a 36 y.o. female presenting with leg pain. The history is provided by the patient. No language interpreter was used.  Leg Pain  The incident occurred 2 days ago. The incident occurred at home. There was no injury mechanism. The pain is present in the right leg. The quality of the pain is described as aching and burning. The pain is at a severity of 7/10. The pain is moderate. The pain has been constant since onset. Pertinent negatives include no numbness. She reports no foreign bodies present. She has tried elevation for the symptoms. The treatment provided no relief.  Pt was seen hereyesterday by Dr. Ethelda Chick.   Pt was given Iv antibiotics.  Pt here for recheck.  Pt reports increased redness. Pt reports she ha a fever last pm.  Pt reports she soaked the sheets with sweat.  Past Medical History  Diagnosis Date  . Diabetes mellitus   . Polycystic ovarian syndrome   . Cellulitis of lower leg     History reviewed. No pertinent past surgical history.  History reviewed. No pertinent family history.  History  Substance Use Topics  . Smoking status: Never Smoker   . Smokeless tobacco: Not on file  . Alcohol Use: No    OB History    Grav Para Term Preterm Abortions TAB SAB Ect Mult Living                  Review of Systems  Constitutional: Positive for fever.  Skin: Positive for wound.  Neurological: Negative for numbness.  All other systems reviewed and are negative.    Allergies  Review of patient's allergies indicates no known allergies.  Home Medications   Current Outpatient Rx  Name Route Sig Dispense Refill  . AMOXICILLIN-POT CLAVULANATE 875-125 MG PO TABS Oral Take 1 tablet  by mouth every 12 (twelve) hours. 14 tablet 0  . HYDROCODONE-ACETAMINOPHEN 5-325 MG PO TABS Oral Take 2 tablets by mouth every 4 (four) hours as needed for pain. 16 tablet 0  . INSULIN ASPART PROT & ASPART (70-30) 100 UNIT/ML [place] SUSP Subcutaneous Inject 30 Units into the skin daily with breakfast.     . INSULIN GLARGINE 100 UNIT/ML [place] SOLN Subcutaneous Inject 60 Units into the skin at bedtime.     Marland Kitchen LABETALOL HCL 200 MG PO TABS Oral Take 200 mg by mouth 2 (two) times daily.    Marland Kitchen METFORMIN HCL ER (OSM) 500 MG PO TB24 Oral Take 500 mg by mouth 2 (two) times daily with a meal.      . OVER THE COUNTER MEDICATION Oral Take 1 packet by mouth daily. Nutralite Vitamins      BP 165/82  Pulse 107  Temp(Src) 98.2 F (36.8 C) (Oral)  Resp 18  Ht 5\' 5"  (1.651 m)  Wt 290 lb (131.543 kg)  BMI 48.26 kg/m2  SpO2 98%  LMP 02/24/2011  Physical Exam  Nursing note and vitals reviewed. Constitutional: She appears well-developed and well-nourished.  HENT:  Head: Normocephalic and atraumatic.  Mouth/Throat: Oropharynx is clear and moist.  Eyes: Pupils are equal, round, and reactive to light.  Neck: Normal range of motion.  Cardiovascular: Normal rate and normal heart sounds.   Pulmonary/Chest: Effort normal.  Abdominal: Soft.  Musculoskeletal: She exhibits edema and tenderness.       celuulitis extendeding beyond marked area,  Erythematous streak going up right inner thigh.  Neurological: She is alert.  Skin: There is erythema.  Psychiatric: She has a normal mood and affect.    ED Course  Procedures (including critical care time)  Labs Reviewed  GLUCOSE, CAPILLARY - Abnormal; Notable for the following:    Glucose-Capillary 245 (*)    All other components within normal limits  CBC - Abnormal; Notable for the following:    WBC 16.3 (*)    All other components within normal limits  DIFFERENTIAL - Abnormal; Notable for the following:    Neutrophils Relative 84 (*)    Lymphocytes Relative 3 (*)     Neutro Abs 15.0 (*)    Lymphs Abs 0.5 (*)    All other components within normal limits  BASIC METABOLIC PANEL - Abnormal; Notable for the following:    Glucose, Bld 262 (*)    All other components within normal limits   No results found.   No diagnosis found.    MDM  Pt given 2nd dosage of iV antibiotics,  Labs obtained.  Dr. Ethelda Chick in to see and examine.    I spoke with Dr. Gentry Fitz to admit.     Langston Masker, [place] 02/26/11 2014  Langston Masker, [place] 02/26/11 2021

## 2011-02-26 NOTE — ED Notes (Signed)
Here yesterday for cellulitis of right lower ext. Area marked. Pt states feels better but redness has gotten worse.

## 2011-02-27 ENCOUNTER — Encounter (HOSPITAL_COMMUNITY): Payer: Self-pay | Admitting: Internal Medicine

## 2011-02-27 LAB — CBC
MCH: 33 pg (ref 26.0–34.0)
MCHC: 33.2 g/dL (ref 30.0–36.0)
MCV: 99.3 fL (ref 78.0–100.0)
Platelets: 198 10*3/uL (ref 150–400)
Platelets: 200 10*3/uL (ref 150–400)
RBC: 4.06 MIL/uL (ref 3.87–5.11)
RDW: 13.6 % (ref 11.5–15.5)
RDW: 13.7 % (ref 11.5–15.5)
WBC: 14.1 10*3/uL — ABNORMAL HIGH (ref 4.0–10.5)
WBC: 12.8 10*3/uL — ABNORMAL HIGH (ref 4.0–10.5)

## 2011-02-27 LAB — COMPREHENSIVE METABOLIC PANEL
ALT: 33 U/L (ref 0–35)
AST: 27 U/L (ref 0–37)
Albumin: 3.1 g/dL — ABNORMAL LOW (ref 3.5–5.2)
CO2: 25 mEq/L (ref 19–32)
Chloride: 101 mEq/L (ref 96–112)
Creatinine, Ser: 0.58 mg/dL (ref 0.50–1.10)
GFR calc non Af Amer: >90 mL/min (ref >90)
Sodium: 136 mEq/L (ref 135–145)
Total Bilirubin: 0.4 mg/dL (ref 0.3–1.2)

## 2011-02-27 LAB — CK: Total CK: 35 U/L (ref 7–177)

## 2011-02-27 LAB — DIFFERENTIAL
Basophils Absolute: 0 10*3/uL (ref 0.0–0.1)
Basophils Relative: 0 % (ref 0–1)
Lymphocytes Relative: 10 % — ABNORMAL LOW (ref 12–46)
Monocytes Absolute: 0.7 10*3/uL (ref 0.1–1.0)
Neutro Abs: 10.8 10*3/uL — ABNORMAL HIGH (ref 1.7–7.7)

## 2011-02-27 LAB — GLUCOSE, CAPILLARY
Glucose-Capillary: 202 mg/dL — ABNORMAL HIGH (ref 70–99)
Glucose-Capillary: 198 mg/dL — ABNORMAL HIGH (ref 70–99)
Glucose-Capillary: 252 mg/dL — ABNORMAL HIGH (ref 70–99)

## 2011-02-27 LAB — HEMOGLOBIN A1C: Hgb A1c MFr Bld: 10 % — ABNORMAL HIGH (ref <5.7)

## 2011-02-27 LAB — CREATININE, SERUM
Creatinine, Ser: 0.64 mg/dL (ref 0.50–1.10)
GFR calc Af Amer: >90 mL/min (ref >90)

## 2011-02-27 MED ORDER — INSULIN GLARGINE 100 UNIT/ML ~~LOC~~ SOLN
65.0000 [IU] | Freq: Every day | SUBCUTANEOUS | Status: DC
  Administered 2011-02-27 – 2011-02-28 (×2): 65 [IU] via SUBCUTANEOUS

## 2011-02-27 MED ORDER — PIPERACILLIN-TAZOBACTAM 3.375 G IVPB
3.3750 g | Freq: Three times a day (TID) | INTRAVENOUS | Status: DC
  Administered 2011-02-27 – 2011-03-01 (×7): 3.375 g via INTRAVENOUS
  Filled 2011-02-27 (×9): qty 50

## 2011-02-27 MED ORDER — SODIUM CHLORIDE 0.9 % IV SOLN
1250.0000 mg | Freq: Two times a day (BID) | INTRAVENOUS | Status: DC
  Administered 2011-02-27 – 2011-03-01 (×5): 1250 mg via INTRAVENOUS
  Filled 2011-02-27 (×6): qty 1250

## 2011-02-27 MED ORDER — ALBUTEROL SULFATE HFA 108 (90 BASE) MCG/ACT IN AERS
2.0000 | INHALATION_SPRAY | RESPIRATORY_TRACT | Status: DC | PRN
  Filled 2011-02-27: qty 6.7

## 2011-02-27 MED ORDER — VANCOMYCIN HCL 1000 MG IV SOLR
1500.0000 mg | Freq: Once | INTRAVENOUS | Status: AC
  Administered 2011-02-27: 1500 mg via INTRAVENOUS
  Filled 2011-02-27: qty 1500

## 2011-02-27 MED ORDER — INSULIN ASPART 100 UNIT/ML ~~LOC~~ SOLN
0.0000 [IU] | Freq: Three times a day (TID) | SUBCUTANEOUS | Status: DC
  Administered 2011-02-27: 4 [IU] via SUBCUTANEOUS
  Administered 2011-02-27: 7 [IU] via SUBCUTANEOUS
  Administered 2011-02-28: 11 [IU] via SUBCUTANEOUS
  Administered 2011-02-28: 7 [IU] via SUBCUTANEOUS
  Administered 2011-02-28: 4 [IU] via SUBCUTANEOUS
  Administered 2011-03-01 (×2): 7 [IU] via SUBCUTANEOUS
  Administered 2011-03-01: 4 [IU] via SUBCUTANEOUS
  Administered 2011-03-02: 7 [IU] via SUBCUTANEOUS
  Administered 2011-03-02: 3 [IU] via SUBCUTANEOUS

## 2011-02-27 MED ORDER — INSULIN ASPART 100 UNIT/ML ~~LOC~~ SOLN
0.0000 [IU] | Freq: Every day | SUBCUTANEOUS | Status: DC
  Administered 2011-02-27: 3 [IU] via SUBCUTANEOUS
  Administered 2011-02-28 – 2011-03-01 (×2): 2 [IU] via SUBCUTANEOUS

## 2011-02-27 NOTE — ED Provider Notes (Signed)
Medical screening examination/treatment/procedure(s) were conducted as a shared visit with non-physician practitioner(s) and myself.  I personally evaluated the patient during the encounter  Doug Sou, MD 02/27/11 [phone]

## 2011-02-27 NOTE — Progress Notes (Signed)
ANTIBIOTIC CONSULT NOTE - INITIAL  Pharmacy Consult for Vancocin/Zosyn Indication: cellulitis  No Known Allergies  Patient Measurements: Height: 5\' 5"  (165.1 cm) Weight: 295 lb 10.2 oz (134.1 kg) IBW/kg (Calculated) : 57   Vital Signs: Temp: 98.2 F (36.8 C) (02/17 2202) Temp src: Oral (02/17 1528) BP: 142/89 mmHg (02/17 2202) Pulse Rate: 109  (02/17 2202)  Labs:  Basename 02/26/11 1750 02/25/11 1830  WBC 16.3* --  HGB 14.0 --  PLT 207 --  LABCREA -- --  CREATININE 0.60 0.70   Estimated Creatinine Clearance: 133.5 ml/min (by C-G formula based on Cr of 0.6).  Microbiology: No results found for this or any previous visit (from the past 720 hour(s)).  Medical History: Past Medical History  Diagnosis Date  . Diabetes mellitus   . Polycystic ovarian syndrome   . Cellulitis of lower leg   . Hypertension   . Asthma    Medications:  Prescriptions prior to admission  Medication Sig Dispense Refill  . amoxicillin-clavulanate (AUGMENTIN) 875-125 MG per tablet Take 1 tablet by mouth every 12 (twelve) hours.  14 tablet  0  . HYDROcodone-acetaminophen (NORCO) 5-325 MG per tablet Take 2 tablets by mouth every 4 (four) hours as needed for pain.  16 tablet  0  . insulin aspart protamine-insulin aspart (NOVOLOG 70/30) (70-30) 100 UNIT/ML injection Inject 30 Units into the skin daily with breakfast.       . insulin glargine (LANTUS) 100 UNIT/ML injection Inject 60 Units into the skin at bedtime.       Marland Kitchen labetalol (NORMODYNE) 200 MG tablet Take 200 mg by mouth 2 (two) times daily.      . metformin (FORTAMET) 500 MG (OSM) 24 hr tablet Take 500 mg by mouth 2 (two) times daily with a meal.        . OVER THE COUNTER MEDICATION Take 1 packet by mouth daily. Nutralite Vitamins       Scheduled:    . enoxaparin  40 mg Subcutaneous Q24H  . insulin aspart  0-9 Units Subcutaneous TID WC  . insulin aspart protamine-insulin aspart  30 Units Subcutaneous Q breakfast  . insulin glargine  60  Units Subcutaneous QHS  . labetalol  200 mg Oral BID  . oxyCODONE-acetaminophen  2 tablet Oral Once  . piperacillin-tazobactam (ZOSYN)  IV  3.375 g Intravenous Once  . piperacillin-tazobactam (ZOSYN)  IV  3.375 g Intravenous Q8H  . sodium chloride  1,000 mL Intravenous Once  . vancomycin  1,250 mg Intravenous Q12H  . vancomycin  1,500 mg Intravenous Once  . vancomycin  1,000 mg Intravenous Once  . DISCONTD: ondansetron  4 mg Intravenous Once   Assessment: 35yo female seen yesterday in ED for cellulitis and discharged on Augmentin, PMH including recurrent cellulitis and noncompliance with DM treatment, returns today with worsening cellulitis, now to begin IV ABX.  Goal of Therapy:  Vancomycin trough level 10-15 mcg/ml  Plan:  Rec'd vanc 1g and Zosyn 3.375g IV in ED.  Will give additional 1500mg  vanc to complete load then begin vancomycin 1250mg  IV Q12H and Zosyn 3/375g IV Q8H.  Monitor CBC, Cx, levels prn.  Colleen Can PharmD BCPS 02/27/2011,12:25 AM

## 2011-02-27 NOTE — Progress Notes (Signed)
I have reviewed and discussed the care of this patient in detail with the NP including pertinent patient records, physical exam findings and data. I agree with details of this encounter.  

## 2011-02-27 NOTE — Progress Notes (Signed)
Inpatient Diabetes Program Recommendations  AACE/ADA: New Consensus Statement on Inpatient Glycemic Control (2009)  Target Ranges:  Prepandial:   less than 140 mg/dL      Peak postprandial:   less than 180 mg/dL (1-2 hours)      Critically ill patients:  140 - 180 mg/dL   Reason for Visit: Hyperglycemia  Results for KATORA, FINI (MRN [id number]) as of 02/27/2011 15:57  Ref. Range 02/27/2011 00:37  Hemoglobin A1C Latest Range: <5.7 % 10.0 (H)  Results for PRESCIOUS, HURLESS (MRN [id number]) as of 02/27/2011 15:57  Ref. Range 02/27/2011 12:36  Glucose-Capillary Latest Range: 70-99 mg/dL 272 (H)    Inpatient Diabetes Program Recommendations Outpatient Referral: OP Diabetes Educ consult for HgbA1C > 7.  Note: Will follow.

## 2011-02-27 NOTE — Plan of Care (Signed)
Problem: Phase I Progression Outcomes Goal: Initial discharge plan identified Outcome: Completed/Met Date Met:  02/27/11 Return home with spouse when medically cleared

## 2011-02-27 NOTE — Progress Notes (Signed)
INITIAL ADULT NUTRITION ASSESSMENT Date: 02/27/2011   Time: 10:10 AM Reason for Assessment: Nutrition Risk  ASSESSMENT: Female 36 y.o.  Dx: Right lower extremity pain and swelling  Hx:  Past Medical History  Diagnosis Date  . Diabetes mellitus   . Polycystic ovarian syndrome   . Cellulitis of lower leg   . Hypertension   . Asthma     Related Meds:     . enoxaparin  40 mg Subcutaneous Q24H  . insulin aspart  0-20 Units Subcutaneous TID WC  . insulin aspart  0-5 Units Subcutaneous QHS  . insulin aspart protamine-insulin aspart  30 Units Subcutaneous Q breakfast  . insulin glargine  65 Units Subcutaneous QHS  . labetalol  200 mg Oral BID  . oxyCODONE-acetaminophen  2 tablet Oral Once  . piperacillin-tazobactam (ZOSYN)  IV  3.375 g Intravenous Once  . piperacillin-tazobactam (ZOSYN)  IV  3.375 g Intravenous Q8H  . sodium chloride  1,000 mL Intravenous Once  . vancomycin  1,250 mg Intravenous Q12H  . vancomycin  1,500 mg Intravenous Once  . vancomycin  1,000 mg Intravenous Once  . DISCONTD: insulin aspart  0-9 Units Subcutaneous TID WC  . DISCONTD: insulin glargine  60 Units Subcutaneous QHS  . DISCONTD: ondansetron  4 mg Intravenous Once     Ht: 5\' 5"  (165.1 cm)  Wt: 295 lb 10.2 oz (134.1 kg)  Ideal Wt: 56.8 kg % Ideal Wt: 236%  Usual Wt:  Wt Readings from Last 3 Encounters:  02/26/11 295 lb 10.2 oz (134.1 kg)  02/25/11 290 lb (131.543 kg)    % Usual Wt: 102%  Body mass index is 49.20 kg/(m^2). This meets criteria for morbid obesity  Food/Nutrition Related Hx: Nothing identified on Nutrition Risk assessment, but RD consult needed was identified as yes, no comment. Patient reports normal appetite PTA, no weight loss, no dysphagia.   Labs:  CMP     Component Value Date/Time   NA 136 02/27/2011 0503   K 3.8 02/27/2011 0503   CL 101 02/27/2011 0503   CO2 25 02/27/2011 0503   GLUCOSE 206* 02/27/2011 0503   BUN 7 02/27/2011 0503   CREATININE 0.58 02/27/2011 0503     CALCIUM 9.5 02/27/2011 0503   PROT 6.8 02/27/2011 0503   ALBUMIN 3.1* 02/27/2011 0503   AST 27 02/27/2011 0503   ALT 33 02/27/2011 0503   ALKPHOS 66 02/27/2011 0503   BILITOT 0.4 02/27/2011 0503   GFRNONAA >90 02/27/2011 0503   GFRAA >90 02/27/2011 0503     Diet Order: Carb Control  Supplements/Tube Feeding: none  IVF:    sodium chloride Last Rate: 100 mL/hr at 02/27/11 0000  DISCONTD: sodium chloride Last Rate: 1,000 mL (02/26/11 2215)    Estimated Nutritional Needs:   Kcal: 1900-2100 Protein: 85-95 gm  Fluid: 1.9 - 2.1 L   Patient reports she does not need an RD. No questions about diet.   NUTRITION DIAGNOSIS: No Nutrition DX at this time  EDUCATION NEEDS: -No education needs identified at this time  INTERVENTION: No nutrition interventions at this time.   Dietitian [phone]  DOCUMENTATION CODES Per approved criteria  -Morbid Obesity    Kelli Allen 02/27/2011, 10:10 AM

## 2011-02-27 NOTE — H&P (Signed)
Kelli Allen is an 36 y.o. female.   PCP - Presently none. Chief Complaint: Right lower extremity pain and swelling. HPI: 36 year-old female history of diabetes mellitus type 2, hypertension, asthma and eczema and polycystic ovarian disease presented to the ER 2 days ago with a sudden onset of groin pain and erythema of the right lower extremity. Patient also had subjective feeling of fever and chills. In the ER at [facility] patient was given IV antibiotics (vancomycin and Zosyn) one dose and was sent home on by mouth antibiotics. Patient was asked to followup next day. Patient followed up last evening. Her cellulitis had worsened and had gone beyond the markings. Patient has been admitted for further workup and management. Patient denies any nausea vomiting abdominal pain dysuria discharges chest pain. Patient's asthma is usually controlled by when necessary albuterol which patient uses once or twice a month. Patient did use it yesterday once.  Past Medical History  Diagnosis Date  . Diabetes mellitus   . Polycystic ovarian syndrome   . Cellulitis of lower leg   . Hypertension   . Asthma     History reviewed. No pertinent past surgical history.  Family History  Problem Relation Age of Onset  . Diabetes type II Mother   . Coronary artery disease Mother   . Diabetes type II Father   . Coronary artery disease Father    Social History:  reports that she has never smoked. She does not have any smokeless tobacco history on file. She reports that she does not drink alcohol or use illicit drugs.  Allergies: No Known Allergies  Medications Prior to Admission  Medication Dose Route Frequency Provider Last Rate Last Dose  . 0.9 %  sodium chloride infusion   Intravenous Continuous Eduard Clos, MD      . acetaminophen (TYLENOL) tablet 650 mg  650 mg Oral Q6H PRN Eduard Clos, MD       Or  . acetaminophen (TYLENOL) suppository 650 mg  650 mg Rectal Q6H PRN Eduard Clos, MD      . acetaminophen (TYLENOL) tablet 1,000 mg  1,000 mg Oral Once Doug Sou, MD   1,000 mg at 02/25/11 2145  . enoxaparin (LOVENOX) injection 40 mg  40 mg Subcutaneous Q24H Eduard Clos, MD      . hydrALAZINE (APRESOLINE) injection 10 mg  10 mg Intravenous Q4H PRN Eduard Clos, MD      . HYDROcodone-acetaminophen (NORCO) 5-325 MG per tablet 2 tablet  2 tablet Oral Q4H PRN Eduard Clos, MD      . HYDROmorphone (DILAUDID) injection 0.5 mg  0.5 mg Intravenous Q4H PRN Eduard Clos, MD      . insulin aspart (novoLOG) injection 0-9 Units  0-9 Units Subcutaneous TID WC Eduard Clos, MD      . insulin aspart protamine-insulin aspart (NOVOLOG 70/30) injection 30 Units  30 Units Subcutaneous Q breakfast Eduard Clos, MD      . insulin glargine (LANTUS) injection 60 Units  60 Units Subcutaneous QHS Eduard Clos, MD      . labetalol (NORMODYNE) tablet 200 mg  200 mg Oral BID Eduard Clos, MD      . morphine 4 MG/ML injection 4 mg  4 mg Intravenous Once Doug Sou, MD   4 mg at 02/25/11 1836  . ondansetron (ZOFRAN) tablet 4 mg  4 mg Oral Q6H PRN Eduard Clos, MD  Or  . ondansetron (ZOFRAN) injection 4 mg  4 mg Intravenous Q6H PRN Eduard Clos, MD      . oxyCODONE-acetaminophen (PERCOCET) 5-325 MG per tablet 2 tablet  2 tablet Oral Once [place], [place]   2 tablet at 02/26/11 1814  . piperacillin-tazobactam (ZOSYN) IVPB 3.375 g  3.375 g Intravenous Once Doug Sou, MD   3.375 g at 02/25/11 1948  . piperacillin-tazobactam (ZOSYN) IVPB 3.375 g  3.375 g Intravenous Once Langston Masker, [place]   3.375 g at 02/26/11 1922  . sodium chloride 0.9 % bolus 1,000 mL  1,000 mL Intravenous Once Doug Sou, MD   1,000 mL at 02/25/11 1836  . sodium chloride 0.9 % bolus 1,000 mL  1,000 mL Intravenous Once Doug Sou, MD   1,000 mL at 02/25/11 1958  . sodium chloride 0.9 % bolus 1,000 mL  1,000 mL Intravenous Once  Langston Masker, PA   1,000 mL at 02/26/11 1756  . vancomycin (VANCOCIN) IVPB 1000 mg/200 mL premix  1,000 mg Intravenous Once Doug Sou, MD   1,000 mg at 02/25/11 1836  . vancomycin (VANCOCIN) IVPB 1000 mg/200 mL premix  1,000 mg Intravenous Once [place], [place]   1,000 mg at 02/26/11 1756  . DISCONTD: 0.9 %  sodium chloride infusion  1,000 mL Intravenous Continuous Langston Masker, [place] 125 mL/hr at 02/26/11 2215 1,000 mL at 02/26/11 2215  . DISCONTD: acetaminophen (TYLENOL) tablet 650 mg  650 mg Oral Q6H PRN Langston Masker, [place]   650 mg at 02/26/11 2211  . DISCONTD: HYDROmorphone (DILAUDID) injection 1 mg  1 mg Intravenous Q4H PRN Langston Masker, [place]      . DISCONTD: ondansetron [facility]) injection 4 mg  4 mg Intravenous Once Langston Masker, [place]      . DISCONTD: piperacillin-tazobactam (ZOSYN) IVPB 3.375 g  3.375 g Intravenous Once Doug Sou, MD       Medications Prior to Admission  Medication Sig Dispense Refill  . amoxicillin-clavulanate (AUGMENTIN) 875-125 MG per tablet Take 1 tablet by mouth every 12 (twelve) hours.  14 tablet  0  . HYDROcodone-acetaminophen (NORCO) 5-325 MG per tablet Take 2 tablets by mouth every 4 (four) hours as needed for pain.  16 tablet  0  . insulin aspart protamine-insulin aspart (NOVOLOG 70/30) (70-30) 100 UNIT/ML injection Inject 30 Units into the skin daily with breakfast.       . insulin glargine (LANTUS) 100 UNIT/ML injection Inject 60 Units into the skin at bedtime.       Marland Kitchen labetalol (NORMODYNE) 200 MG tablet Take 200 mg by mouth 2 (two) times daily.      . metformin (FORTAMET) 500 MG (OSM) 24 hr tablet Take 500 mg by mouth 2 (two) times daily with a meal.        . OVER THE COUNTER MEDICATION Take 1 packet by mouth daily. Nutralite Vitamins        Results for orders placed during the hospital encounter of 02/26/11 (from the past 48 hour(s))  GLUCOSE, CAPILLARY     Status: Abnormal   Collection Time   02/26/11  4:32 PM      Component Value Range Comment    Glucose-Capillary 245 (*) 70 - 99 (mg/dL)    Comment 1 Notify RN      Comment 2 Documented in Chart     CBC     Status: Abnormal   Collection Time   02/26/11  5:50 PM      Component Value Range Comment  WBC 16.3 (*) 4.0 - 10.5 (K/uL)    RBC 4.22  3.87 - 5.11 (MIL/uL)    Hemoglobin 14.0  12.0 - 15.0 (g/dL)    HCT 01.0  27.2 - 53.6 (%)    MCV 98.3  78.0 - 100.0 (fL)    MCH 33.2  26.0 - 34.0 (pg)    MCHC 33.7  30.0 - 36.0 (g/dL)    RDW 64.4  03.4 - 74.2 (%)    Platelets 207  150 - 400 (K/uL)   DIFFERENTIAL     Status: Abnormal   Collection Time   02/26/11  5:50 PM      Component Value Range Comment   Neutrophils Relative 84 (*) 43 - 77 (%)    Lymphocytes Relative 3 (*) 12 - 46 (%)    Monocytes Relative 5  3 - 12 (%)    Eosinophils Relative 0  0 - 5 (%)    Basophils Relative 0  0 - 1 (%)    Band Neutrophils 8  0 - 10 (%)    Metamyelocytes Relative 0      Myelocytes 0      Promyelocytes Absolute 0      Blasts 0      nRBC 0  0 (/100 WBC)    Neutro Abs 15.0 (*) 1.7 - 7.7 (K/uL)    Lymphs Abs 0.5 (*) 0.7 - 4.0 (K/uL)    Monocytes Absolute 0.8  0.1 - 1.0 (K/uL)    Eosinophils Absolute 0.0  0.0 - 0.7 (K/uL)    Basophils Absolute 0.0  0.0 - 0.1 (K/uL)    WBC Morphology MILD LEFT SHIFT (1-5% METAS, OCC MYELO, OCC BANDS)     BASIC METABOLIC PANEL     Status: Abnormal   Collection Time   02/26/11  5:50 PM      Component Value Range Comment   Sodium 136  135 - 145 (mEq/L)    Potassium 4.1  3.5 - 5.1 (mEq/L)    Chloride 100  96 - 112 (mEq/L)    CO2 24  19 - 32 (mEq/L)    Glucose, Bld 262 (*) 70 - 99 (mg/dL)    BUN 10  6 - 23 (mg/dL)    Creatinine, Ser 5.95  0.50 - 1.10 (mg/dL)    Calcium 63.8  8.4 - 10.5 (mg/dL)    GFR calc non Af Amer >90  >90 (mL/min)    GFR calc Af Amer >90  >90 (mL/min)    No results found.  Review of Systems  Constitutional: Positive for fever and chills.  HENT: Negative.   Eyes: Negative.   Respiratory: Negative.   Cardiovascular: Negative.     Gastrointestinal: Negative.   Genitourinary: Negative.   Musculoskeletal:       Right lower extremity erythema and pain and swelling.  Skin: Negative.   Neurological: Negative.   Endo/Heme/Allergies: Negative.   Psychiatric/Behavioral: Negative.     Blood pressure 142/89, pulse 109, temperature 98.2 F (36.8 C), temperature source Oral, resp. rate 18, height 5\' 5"  (1.651 m), weight 134.1 kg (295 lb 10.2 oz), last menstrual period 02/24/2011, SpO2 95.00%. Physical Exam  Constitutional: She is oriented to person, place, and time. She appears well-developed and well-nourished. No distress.  HENT:  Head: Normocephalic and atraumatic.  Right Ear: External ear normal.  Left Ear: External ear normal.  Nose: Nose normal.  Mouth/Throat: Oropharynx is clear and moist. No oropharyngeal exudate.  Eyes: Conjunctivae are normal. Pupils are equal, round, and reactive to  light. Right eye exhibits no discharge. Left eye exhibits no discharge. No scleral icterus.  Neck: Normal range of motion. Neck supple.  Cardiovascular: Normal rate, regular rhythm and normal heart sounds.   Respiratory: Effort normal and breath sounds normal. No respiratory distress. She has no wheezes. She has no rales.  GI: Soft. Bowel sounds are normal. She exhibits no distension. There is no tenderness. There is no rebound.  Musculoskeletal:       Erythema and swelling of the right lower extremity extending from the ankle to knee. Able to move extremities.   Neurological: She is alert and oriented to person, place, and time.       Moves upper and lower extremities 5/5.  Skin: Skin is warm. She is not diaphoretic. There is erythema (of the right lower extremity.).  Psychiatric: Her behavior is normal.     Assessment/Plan #1. Cellulitis of the right lower extremity - at this time we will continue with vancomycin and Zosyn. Patient has got good movements of her extremities. We will closely observe for any complications. Patient  did have a history of cellulitis in the same leg previously twice. The last one was in November 2011. #2. Diabetes mellitus type 2 - continue her home dose of medications. Check CBG with sliding scale coverage. #3. History of hypertension - continue home medications. I have placed patient on when necessary hydralazine. #4. History of asthma and eczema - continue albuterol when necessary for asthma. #5. Chronic facial rash - patient states she's been having this rash for over 3 years. Further workup as outpatient.  CODE STATUS - full code.  Nickolai Rinks N. 02/27/2011, 12:16 AM

## 2011-02-27 NOTE — Progress Notes (Signed)
PATIENT DETAILS Name: Kelli Allen Age: 36 y.o. Sex: female Date of Birth: July 31, 1975 Admit Date: 02/26/2011 PCP: none POA:   CONSULTS: None  Subjective: Feels like swelling in right leg has gone down. Several previous episodes of cellulitis in affected extremity. Pt is trying to establish primary care with [place]. Her previous PCP has moved.   Objective: Vital signs in last 24 hours: Temp:  [98 F (36.7 C)-98.2 F (36.8 C)] 98 F (36.7 C) (02/18 0542) Pulse Rate:  [98-109] 100  (02/18 0912) Resp:  [18] 18  (02/18 0542) BP: (122-165)/(81-97) 141/82 mmHg (02/18 0912) SpO2:  [95 %-98 %] 97 % (02/18 0542) Weight:  [131.543 kg (290 lb)-134.1 kg (295 lb 10.2 oz)] 134.1 kg (295 lb 10.2 oz) (02/17 2202) Weight change:  Last BM Date: 02/25/11  Intake/Output from previous day: No intake or output data in the 24 hours ending 02/27/11 0917   Physical Exam:  Gen:  Awake, alert Cardiovascular:  S1S2 RRR, no m/r/g,  Respiratory: CTAB, no w/r/c, no increased wob Gastrointestinal: abdomen obese, soft, NT, BS+ Extremities: erythema and mild swelling to right lower extremity extending just beyond marked line. 2+ DP pulse. MAE x 4.    Lab Results:  Lab 02/27/11 0503 02/27/11 0037 02/26/11 1750  HGB 13.3 13.4 14.0  HCT 40.1 40.3 41.5  WBC 12.8* 14.1* 16.3*  PLT 200 198 207     Lab 02/27/11 0503 02/27/11 0037 02/26/11 1750 02/25/11 1830  NA 136 -- 136 132*  K 3.8 -- 4.1 --  CL 101 -- 100 95*  CO2 25 -- 24 26  GLUCOSE 206* -- 262* 333*  BUN 7 -- 10 12  CREATININE 0.58 0.64 0.60 0.70  CALCIUM 9.5 -- 10.5 10.7*  MG -- -- -- --  PHOS -- -- -- --    Studies/Results: No results found.  Medications: Scheduled Meds:   . enoxaparin  40 mg Subcutaneous Q24H  . insulin aspart  0-9 Units Subcutaneous TID WC  . insulin aspart protamine-insulin aspart  30 Units Subcutaneous Q breakfast  . insulin glargine  60 Units Subcutaneous QHS  . labetalol  200 mg Oral BID  .  oxyCODONE-acetaminophen  2 tablet Oral Once  . piperacillin-tazobactam (ZOSYN)  IV  3.375 g Intravenous Once  . piperacillin-tazobactam (ZOSYN)  IV  3.375 g Intravenous Q8H  . sodium chloride  1,000 mL Intravenous Once  . vancomycin  1,250 mg Intravenous Q12H  . vancomycin  1,500 mg Intravenous Once  . vancomycin  1,000 mg Intravenous Once  . DISCONTD: ondansetron  4 mg Intravenous Once   Continuous Infusions:   . sodium chloride 100 mL/hr at 02/27/11 0000  . DISCONTD: sodium chloride 1,000 mL (02/26/11 2215)   PRN Meds:.acetaminophen, acetaminophen, albuterol, hydrALAZINE, HYDROcodone-acetaminophen, HYDROmorphone, ondansetron (ZOFRAN) IV, ondansetron, DISCONTD: acetaminophen, DISCONTD:  HYDROmorphone (DILAUDID) injection Antibiotics: Anti-infectives     Start     Dose/Rate Route Frequency Ordered Stop   02/27/11 0800   vancomycin (VANCOCIN) 1,250 mg in sodium chloride 0.9 % 250 mL IVPB        1,250 mg 166.7 mL/hr over 90 Minutes Intravenous Every 12 hours 02/27/11 0025     02/27/11 0400  piperacillin-tazobactam (ZOSYN) IVPB 3.375 g       3.375 g 12.5 mL/hr over 240 Minutes Intravenous Every 8 hours 02/27/11 0025     02/27/11 0030   vancomycin (VANCOCIN) 1,500 mg in sodium chloride 0.9 % 500 mL IVPB        1,500 mg 250 mL/hr  over 120 Minutes Intravenous  Once 02/27/11 0025 02/27/11 0237   02/26/11 1700   vancomycin (VANCOCIN) IVPB 1000 mg/200 mL premix        1,000 mg 200 mL/hr over 60 Minutes Intravenous  Once 02/26/11 1654 02/26/11 1856   02/26/11 1700  piperacillin-tazobactam (ZOSYN) IVPB 3.375 g       3.375 g 12.5 mL/hr over 240 Minutes Intravenous  Once 02/26/11 1654 02/26/11 2322           Assessment/Plan:  1. RLE cellulitis: Continue empiric Vancomycin and Zosyn. It seems like erythema has improved somewhat from admission. She is afebrile and NT appearing with decreasing WBC. Can likely narrow antibiotic coverage in the next 1-2 days.   2. DM2: prior to admit,  pt was taking 70/30 AND Lantus. Will check A1C. D/c 70/30 and increase Lantus dose for now. Will change SSI to resistant scale.   3. Hx htn: reasonable control on home meds. Continue home Labetalol with prn Hydralazine.  4. PCOS: Pt is actually on Metformin for this. Continue at discharge.  5. Hx asthma: Stable.  6. Prophylaxis: on SQ Lovenox  7. Dispo: home when medically stable. Pt is trying to arrange primary care with [place].   Cordelia Pen, NP-C [facility]  pgr [phone]    LOS: 1 day    02/27/2011, 9:17 AM

## 2011-02-28 LAB — BASIC METABOLIC PANEL
Calcium: 9.7 mg/dL (ref 8.4–10.5)
Creatinine, Ser: 0.62 mg/dL (ref 0.50–1.10)
GFR calc non Af Amer: >90 mL/min (ref >90)
Sodium: 139 mEq/L (ref 135–145)

## 2011-02-28 LAB — GLUCOSE, CAPILLARY
Glucose-Capillary: 277 mg/dL — ABNORMAL HIGH (ref 70–99)
Glucose-Capillary: 235 mg/dL — ABNORMAL HIGH (ref 70–99)

## 2011-02-28 LAB — CBC
MCH: 33.1 pg (ref 26.0–34.0)
MCHC: 33.3 g/dL (ref 30.0–36.0)
MCV: 99.5 fL (ref 78.0–100.0)
Platelets: 211 10*3/uL (ref 150–400)
RDW: 13.6 % (ref 11.5–15.5)

## 2011-02-28 MED ORDER — CAMPHOR-MENTHOL 0.5-0.5 % EX LOTN
TOPICAL_LOTION | CUTANEOUS | Status: DC | PRN
  Filled 2011-02-28: qty 222

## 2011-02-28 MED ORDER — INSULIN ASPART 100 UNIT/ML ~~LOC~~ SOLN
15.0000 [IU] | Freq: Three times a day (TID) | SUBCUTANEOUS | Status: DC
  Administered 2011-02-28 – 2011-03-02 (×6): 15 [IU] via SUBCUTANEOUS

## 2011-02-28 NOTE — Progress Notes (Signed)
Utilization review completed.  

## 2011-02-28 NOTE — Progress Notes (Signed)
   CARE MANAGEMENT NOTE 02/28/2011  Patient:  Kelli Allen, Kelli Allen   Account Number:  [id number]  Date Initiated:  02/27/2011  Documentation initiated by:  Letha Cape  Subjective/Objective Assessment:   dx cellulits of leg  admit- lives with spouse, pta independent.     Action/Plan:   continue with proression of care   Anticipated DC Date:  02/28/2011   Anticipated DC Plan:  HOME/SELF CARE      DC Planning Services  CM consult      Choice offered to / List presented to:             Status of service:  Completed, signed off Medicare Important Message given?   (If response is "NO", the following Medicare IM given date fields will be blank) Date Medicare IM given:   Date Additional Medicare IM given:    Discharge Disposition:  HOME/SELF CARE  Per UR Regulation:    Comments:  PCP NONE-Pt states she willl Call Talmage Coin and make her an appt.  02/28/11 12:20 Letha Cape RN, BSN [phone] Patient is for possible dc today, patient wanted Dr. Arthur Holms for PCP , but he is not taking any new patients at this time.  Patient then decided that she will call Talmage Coin (endocronologist) to make her an appt.  She states she will call before she is discharged.  02/27/11 14:48 Letha Cape RN, BSN [phone] Patient lives with spouse, pta independent.  Patient has medication coverage.  Patient does not have a PCP but states she would like to get an appt with Dr. Arthur Holms at [phone].

## 2011-02-28 NOTE — Progress Notes (Signed)
Patient ID: Kelli Allen, female   DOB: Oct 17, 1975, 36 y.o.   MRN: [id number]    PATIENT DETAILS Name: Kelli Allen Age: 36 y.o. Sex: female Date of Birth: 11/21/1975 Admit Date: 02/26/2011 PCP: none POA:   CONSULTS:  Subjective: RT Leg still swollen and erythematous, but feels better.  Patient having difficulty finding PCP.  Objective: Vital signs in last 24 hours: Temp:  [97.7 F (36.5 C)-98.3 F (36.8 C)] 98.3 F (36.8 C) (02/19 1345) Pulse Rate:  [94-105] 105  (02/19 1345) Resp:  [18-20] 20  (02/19 1345) BP: (121-168)/(68-84) 121/68 mmHg (02/19 1345) SpO2:  [94 %-98 %] 94 % (02/19 1345) Weight change:  Last BM Date: 02/27/11  Intake/Output from previous day:  Intake/Output Summary (Last 24 hours) at 02/28/11 1420 Last data filed at 02/28/11 1300  Gross per 24 hour  Intake 1437.34 ml  Output   3450 ml  Net -2012.66 ml     Physical Exam:  Gen:  Awake, alert, morbidly obese. Cardiovascular:  S1S2 RRR, no m/r/g,  Respiratory: CTAB, no w/r/c, no increased wob Gastrointestinal: abdomen obese, soft, NT, BS+ Extremities: erythema and mild swelling to right lower extremity extending just beyond marked line. 1+ DP pulse.   Lab Results:  Lab 02/28/11 0642 02/27/11 0503 02/27/11 0037  HGB 13.3 13.3 13.4  HCT 40.0 40.1 40.3  WBC 11.0* 12.8* 14.1*  PLT 211 200 198     Lab 02/28/11 0642 02/27/11 0503 02/27/11 0037 02/26/11 1750 02/25/11 1830  NA 139 136 -- 136 132*  K 4.1 3.8 -- -- --  CL 104 101 -- 100 95*  CO2 25 25 -- 24 26  GLUCOSE 220* 206* -- 262* 333*  BUN 6 7 -- 10 12  CREATININE 0.62 0.58 0.64 0.60 0.70  CALCIUM 9.7 9.5 -- 10.5 10.7*  MG -- -- -- -- --  PHOS -- -- -- -- --    Studies/Results: ABI pending   Medications: Scheduled Meds:    . enoxaparin  40 mg Subcutaneous Q24H  . insulin aspart  0-20 Units Subcutaneous TID WC  . insulin aspart  0-5 Units Subcutaneous QHS  . insulin aspart  15 Units Subcutaneous TID WC  . insulin glargine   65 Units Subcutaneous QHS  . labetalol  200 mg Oral BID  . piperacillin-tazobactam (ZOSYN)  IV  3.375 g Intravenous Q8H  . vancomycin  1,250 mg Intravenous Q12H  . DISCONTD: insulin aspart protamine-insulin aspart  30 Units Subcutaneous Q breakfast   Continuous Infusions:    . sodium chloride 10 mL/hr at 02/27/11 1628   PRN Meds:.acetaminophen, acetaminophen, albuterol, camphor-menthol, hydrALAZINE, HYDROcodone-acetaminophen, HYDROmorphone, ondansetron (ZOFRAN) IV, ondansetron Antibiotics: Anti-infectives     Start     Dose/Rate Route Frequency Ordered Stop   02/27/11 0800   vancomycin (VANCOCIN) 1,250 mg in sodium chloride 0.9 % 250 mL IVPB        1,250 mg 166.7 mL/hr over 90 Minutes Intravenous Every 12 hours 02/27/11 0025     02/27/11 0400   piperacillin-tazobactam (ZOSYN) IVPB 3.375 g        3.375 g 12.5 mL/hr over 240 Minutes Intravenous Every 8 hours 02/27/11 0025     02/27/11 0030   vancomycin (VANCOCIN) 1,500 mg in sodium chloride 0.9 % 500 mL IVPB        1,500 mg 250 mL/hr over 120 Minutes Intravenous  Once 02/27/11 0025 02/27/11 0237   02/26/11 1700   vancomycin (VANCOCIN) IVPB 1000 mg/200 mL premix  1,000 mg 200 mL/hr over 60 Minutes Intravenous  Once 02/26/11 1654 02/26/11 1856   02/26/11 1700   piperacillin-tazobactam (ZOSYN) IVPB 3.375 g        3.375 g 12.5 mL/hr over 240 Minutes Intravenous  Once 02/26/11 1654 02/26/11 2322           Assessment/Plan:  1. RLE cellulitis: Continue empiric Vancomycin and Zosyn. It seems like erythema has improved somewhat from admission. She is afebrile and NT appearing with decreasing WBC. Can likely narrow antibiotic coverage to Augmentin & Doxy PO on 2/20.  Checking ABIs to rule out vascular deficiency  2. DM2: Hgb A1C is 10.0.  Per DM Coordinator recommendations will adjust meal coverage Novolog and qhs Lantus and eliminate 70/30. Continue SSI - Correction.  3. Hx htn: reasonable control on home meds. Continue  home Labetalol with prn Hydralazine.  Slightly elevated today.  WIll monitor and consider adding Norvasc if not improved 2/20.  4. PCOS: Pt is actually on Metformin for this.  Check fasting lipid panel.  5. Hx asthma: Stable.  6. Prophylaxis: on SQ Lovenox  7. Dispo: home when medically stable likely Thursday (2/21). Pt is trying to arrange primary care with [facility].   [place], [place] [phone] [facility]    LOS: 2 days    02/28/2011, 2:20 PM

## 2011-02-28 NOTE — Progress Notes (Signed)
Inpatient Diabetes Program Recommendations  AACE/ADA: New Consensus Statement on Inpatient Glycemic Control (2009)  Target Ranges:  Prepandial:   less than 140 mg/dL      Peak postprandial:   less than 180 mg/dL (1-2 hours)      Critically ill patients:  140 - 180 mg/dL   Reason for Visit: Hyperglycemia  Results for DENISA, ENTERLINE (MRN [id number]) as of 02/28/2011 13:30  Ref. Range 02/27/2011 12:36 02/27/2011 17:55 02/27/2011 21:24 02/28/2011 07:37 02/28/2011 11:48  Glucose-Capillary Latest Range: 70-99 mg/dL 409 (H) 811 (H) 914 (H) 221 (H) 183 (H)    Inpatient Diabetes Program Recommendations Insulin - Basal: Lantus 65 units QHS Insulin - Meal Coverage: Add Novolog 15 units tidwc for meal coverage insulin Oral Agents: consider increasing metformin to 1000 mg bid Outpatient Referral: OP Diabetes Educ consult for HgbA1C > 7.  Note: Pt in process of finding new PCP to manage diabetes.  States she is willing to check blood sugars 4 times per day at home.  Ready to start new weight loss program and control blood sugars. Stated, "don't fuss at me about missing my Lantus" prior to hospitalization.  Continue to educate on importance of controlling blood sugars to prevent long-term complications. Discussed HgbA1C with pt.  Will follow.

## 2011-02-28 NOTE — Progress Notes (Signed)
I have reviewed and discussed the care of this patient in detail with the PA-Cincluding pertinent patient records, physical exam findings and data. I agree with details of this encounter.

## 2011-03-01 LAB — LIPID PANEL
LDL Cholesterol: 118 mg/dL — ABNORMAL HIGH (ref 0–99)
Total CHOL/HDL Ratio: 5.2 RATIO
VLDL: 41 mg/dL — ABNORMAL HIGH (ref 0–40)

## 2011-03-01 LAB — CBC
Hemoglobin: 13 g/dL (ref 12.0–15.0)
MCH: 32 pg (ref 26.0–34.0)
MCHC: 31.9 g/dL (ref 30.0–36.0)
Platelets: 251 10*3/uL (ref 150–400)
RBC: 4.06 MIL/uL (ref 3.87–5.11)

## 2011-03-01 MED ORDER — AMOXICILLIN-POT CLAVULANATE 875-125 MG PO TABS: 1.0000 | ORAL_TABLET | Freq: Two times a day (BID) | ORAL | Status: DC

## 2011-03-01 MED ORDER — OMEGA-3-ACID ETHYL ESTERS 1 G PO CAPS: 1.0000 g | ORAL_CAPSULE | Freq: Two times a day (BID) | ORAL | Status: DC

## 2011-03-01 MED ORDER — INSULIN GLARGINE 100 UNIT/ML ~~LOC~~ SOLN: 70.0000 [IU] | Freq: Every day | SUBCUTANEOUS | Status: DC

## 2011-03-01 MED ORDER — AMOXICILLIN-POT CLAVULANATE 875-125 MG PO TABS
1.0000 | ORAL_TABLET | Freq: Two times a day (BID) | ORAL | Status: DC
  Administered 2011-03-01 – 2011-03-02 (×2): 1 via ORAL
  Filled 2011-03-01 (×4): qty 1

## 2011-03-01 MED ORDER — OMEGA-3-ACID ETHYL ESTERS 1 G PO CAPS
1.0000 g | ORAL_CAPSULE | Freq: Two times a day (BID) | ORAL | Status: DC
  Administered 2011-03-01 – 2011-03-02 (×3): 1 g via ORAL
  Filled 2011-03-01 (×4): qty 1

## 2011-03-01 MED ORDER — DOXYCYCLINE HYCLATE 100 MG PO TABS
100.0000 mg | ORAL_TABLET | Freq: Two times a day (BID) | ORAL | Status: DC
  Administered 2011-03-01 – 2011-03-02 (×2): 100 mg via ORAL
  Filled 2011-03-01 (×3): qty 1

## 2011-03-01 MED ORDER — INSULIN ASPART 100 UNIT/ML ~~LOC~~ SOLN: 15.0000 [IU] | Freq: Three times a day (TID) | SUBCUTANEOUS | Status: DC

## 2011-03-01 MED ORDER — INSULIN GLARGINE 100 UNIT/ML ~~LOC~~ SOLN
70.0000 [IU] | Freq: Every day | SUBCUTANEOUS | Status: DC
  Administered 2011-03-01: 70 [IU] via SUBCUTANEOUS

## 2011-03-01 MED ORDER — DOXYCYCLINE HYCLATE 100 MG PO TABS: 100.0000 mg | ORAL_TABLET | Freq: Two times a day (BID) | ORAL | Status: AC

## 2011-03-01 NOTE — Progress Notes (Signed)
Patient ID: Kelli Allen, female   DOB: 1975/06/21, 36 y.o.   MRN: [id number]    PATIENT DETAILS Name: Kelli Allen Age: 36 y.o. Sex: female Date of Birth: 06-Nov-1975 Admit Date: 02/26/2011 PCP: none POA:   CONSULTS:  None.  Subjective: Patient's right leg is without pain.  Patient is complaining of Headache (helped with vicodin), and the start of her period.  Objective: Vital signs in last 24 hours: Temp:  [97.8 F (36.6 C)-99 F (37.2 C)] 97.8 F (36.6 C) (02/20 0608) Pulse Rate:  [91-109] 109  (02/20 0912) Resp:  [20-21] 21  (02/20 0608) BP: (120-170)/(68-77) 120/77 mmHg (02/20 0912) SpO2:  [94 %-96 %] 95 % (02/20 4098) Weight change:  Last BM Date: 02/28/11  Intake/Output from previous day:  Intake/Output Summary (Last 24 hours) at 03/01/11 1305 Last data filed at 03/01/11 0943  Gross per 24 hour  Intake    950 ml  Output      0 ml  Net    950 ml     Physical Exam:  Gen:  Awake, alert, morbidly obese. Cheerful Cardiovascular:  S1S2 RRR, no m/r/g,  Respiratory: CTAB, no w/r/c, no increased wob Gastrointestinal: abdomen obese, soft, NT, BS+ Extremities: erythema and mild swelling to right lower extremity improved today. Receeding within the original outlined area.  Lab Results:  Lab 03/01/11 0559 02/28/11 0642 02/27/11 0503  HGB 13.0 13.3 13.3  HCT 40.7 40.0 40.1  WBC 14.7* 11.0* 12.8*  PLT 251 211 200     Lab 02/28/11 0642 02/27/11 0503 02/27/11 0037 02/26/11 1750 02/25/11 1830  NA 139 136 -- 136 132*  K 4.1 3.8 -- -- --  CL 104 101 -- 100 95*  CO2 25 25 -- 24 26  GLUCOSE 220* 206* -- 262* 333*  BUN 6 7 -- 10 12  CREATININE 0.62 0.58 0.64 0.60 0.70  CALCIUM 9.7 9.5 -- 10.5 10.7*  MG -- -- -- -- --  PHOS -- -- -- -- --    Studies/Results: ABI pending   Medications: Scheduled Meds:    . amoxicillin-clavulanate  1 tablet Oral BID WC  . doxycycline  100 mg Oral Q12H  . enoxaparin  40 mg Subcutaneous Q24H  . insulin aspart  0-20 Units  Subcutaneous TID WC  . insulin aspart  0-5 Units Subcutaneous QHS  . insulin aspart  15 Units Subcutaneous TID WC  . insulin glargine  70 Units Subcutaneous QHS  . labetalol  200 mg Oral BID  . omega-3 acid ethyl esters  1 g Oral BID  . DISCONTD: insulin aspart protamine-insulin aspart  30 Units Subcutaneous Q breakfast  . DISCONTD: insulin glargine  65 Units Subcutaneous QHS  . DISCONTD: piperacillin-tazobactam (ZOSYN)  IV  3.375 g Intravenous Q8H  . DISCONTD: vancomycin  1,250 mg Intravenous Q12H   Continuous Infusions:    . DISCONTD: sodium chloride 10 mL/hr at 02/27/11 1628   PRN Meds:.acetaminophen, acetaminophen, albuterol, camphor-menthol, HYDROcodone-acetaminophen, ondansetron, DISCONTD: hydrALAZINE, DISCONTD: HYDROmorphone, DISCONTD: ondansetron (ZOFRAN) IV Antibiotics: Anti-infectives     Start     Dose/Rate Route Frequency Ordered Stop   03/01/11 2200   doxycycline (VIBRA-TABS) tablet 100 mg        100 mg Oral Every 12 hours 03/01/11 1157     03/01/11 1700  amoxicillin-clavulanate (AUGMENTIN) 875-125 MG per tablet 1 tablet       1 tablet Oral 2 times daily with meals 03/01/11 1157     02/27/11 0800   vancomycin (VANCOCIN) 1,250 mg in  sodium chloride 0.9 % 250 mL IVPB  Status:  Discontinued        1,250 mg 166.7 mL/hr over 90 Minutes Intravenous Every 12 hours 02/27/11 0025 03/01/11 1157   02/27/11 0400   piperacillin-tazobactam (ZOSYN) IVPB 3.375 g  Status:  Discontinued        3.375 g 12.5 mL/hr over 240 Minutes Intravenous Every 8 hours 02/27/11 0025 03/01/11 1157   02/27/11 0030   vancomycin (VANCOCIN) 1,500 mg in sodium chloride 0.9 % 500 mL IVPB        1,500 mg 250 mL/hr over 120 Minutes Intravenous  Once 02/27/11 0025 02/27/11 0237   02/26/11 1700   vancomycin (VANCOCIN) IVPB 1000 mg/200 mL premix        1,000 mg 200 mL/hr over 60 Minutes Intravenous  Once 02/26/11 1654 02/26/11 1856   02/26/11 1700  piperacillin-tazobactam (ZOSYN) IVPB 3.375 g       3.375  g 12.5 mL/hr over 240 Minutes Intravenous  Once 02/26/11 1654 02/26/11 2322           Assessment/Plan:  1. RLE cellulitis: Discontinue Vancomycin and Zosyn.  Erythema has improved  from admission. She is afebrile and NT appearing. Changed antibiotic coverage to Augmentin & Doxy PO on 2/20.  Checking ABIs to rule out vascular deficiency  2. DM2: Hgb A1C is 10.0.  Per DM Coordinator recommendations will adjust meal coverage Novolog and qhs Lantus and eliminate 70/30. Continue SSI - Correction.  Increased Lantus to 70 units qhs on 2/20 as cbgs are approximately 200.  3. Hx htn: reasonable control on home meds. Continue home Labetalol with prn Hydralazine.    4. PCOS: Pt is actually on Metformin for this.    5.  High Cholesterol  (triglyercides 200+) will start Lovaza.  6. Hx asthma: Stable.  7. Prophylaxis: on SQ Lovenox  8. Dispo: home when medically stable likely Thursday (2/21). Pt is trying to arrange primary care with [facility].   [place], [place] [phone] [facility]   Patient seen and examined by me.  Hope to D/C tomm after ABI and see how patient tolerates PO antibiotics.  Marlin Canary   LOS: 3 days    03/01/2011, 1:05 PM

## 2011-03-01 NOTE — Progress Notes (Signed)
In-patient Diabetes Recommendations Results for Kelli Allen, Kelli Allen (MRN [id number]) as of 03/01/2011 15:18  Ref. Range 02/28/2011 16:55 02/28/2011 21:36 03/01/2011 08:03 03/01/2011 12:15  Glucose-Capillary Latest Range: 70-99 mg/dL 086 (H) 578 (H) 469 (H) 208 (H)    Increase Novolog to 20 units tidwc for meal coverage insulin.  When metformin is restarted, increase to 1000 mg bid.  Please order OP Diabetes Education consult.  Will follow.  Thanks. Ailene Ards, RD, LDN, CDE

## 2011-03-02 DIAGNOSIS — M79609 Pain in unspecified limb: Secondary | ICD-10-CM

## 2011-03-02 DIAGNOSIS — E782 Mixed hyperlipidemia: Secondary | ICD-10-CM | POA: Diagnosis present

## 2011-03-02 LAB — CBC
HCT: 37.8 % (ref 36.0–46.0)
MCHC: 33.3 g/dL (ref 30.0–36.0)
MCV: 99 fL (ref 78.0–100.0)
Platelets: 255 10*3/uL (ref 150–400)
RDW: 13.4 % (ref 11.5–15.5)
WBC: 12.2 10*3/uL — ABNORMAL HIGH (ref 4.0–10.5)

## 2011-03-02 LAB — GLUCOSE, CAPILLARY
Glucose-Capillary: 201 mg/dL — ABNORMAL HIGH (ref 70–99)
Glucose-Capillary: 142 mg/dL — ABNORMAL HIGH (ref 70–99)

## 2011-03-02 MED ORDER — AMOXICILLIN-POT CLAVULANATE 875-125 MG PO TABS: 1.0000 | ORAL_TABLET | Freq: Two times a day (BID) | ORAL | Status: AC

## 2011-03-02 NOTE — Progress Notes (Signed)
IV previously removed.  Discharge instructions, prescriptions, and follow-up appointments given.  All belongings sent with pt and all questions answered.  Pt declined transport by wheelchair, and traveled home with friends by private vehicle.

## 2011-03-02 NOTE — Discharge Summary (Signed)
Patient ID: Kelli Allen MRN: [id number] DOB/AGE: 1975-10-24 36 y.o.  Admit date: 02/26/2011 Discharge date: 03/02/2011  Primary Care Physician: Arranging PCP follow up with [facility]. Discharge Diagnoses:    Present on Admission:  .Cellulitis, leg .Diabetes mellitus .HTN (hypertension) .POLYCYSTIC OVARY .Asthma *High Cholesterol with High Triglycerides    Medication List  As of 03/02/2011 12:19 PM   STOP taking these medications         insulin aspart protamine-insulin aspart (70-30) 100 UNIT/ML injection         TAKE these medications         amoxicillin-clavulanate 875-125 MG per tablet   Commonly known as: AUGMENTIN   Take 1 tablet by mouth every 12 (twelve) hours.      doxycycline 100 MG tablet   Commonly known as: VIBRA-TABS   Take 1 tablet (100 mg total) by mouth every 12 (twelve) hours.      HYDROcodone-acetaminophen 5-325 MG per tablet   Commonly known as: NORCO   Take 2 tablets by mouth every 4 (four) hours as needed for pain.      insulin aspart 100 UNIT/ML injection   Commonly known as: novoLOG   Inject 15 Units into the skin 3 (three) times daily with meals.      insulin glargine 100 UNIT/ML injection   Commonly known as: LANTUS   Inject 70 Units into the skin at bedtime.      labetalol 200 MG tablet   Commonly known as: NORMODYNE   Take 200 mg by mouth 2 (two) times daily.      metformin 500 MG (OSM) 24 hr tablet   Commonly known as: FORTAMET   Take 500 mg by mouth 2 (two) times daily with a meal.      omega-3 acid ethyl esters 1 G capsule   Commonly known as: LOVAZA   Take 1 capsule (1 g total) by mouth 2 (two) times daily.      OVER THE COUNTER MEDICATION   Take 1 packet by mouth daily. Nutralite Vitamins            Consults:  None.  Brief H and P: From the admission note:  HPI: 36 year-old female history of diabetes mellitus type 2, hypertension, asthma and eczema and polycystic ovarian disease presented to the ER 2  days ago with a sudden onset of groin pain and erythema of the right lower extremity. Patient also had subjective feeling of fever and chills. In the ER at [facility] patient was given IV antibiotics (vancomycin and Zosyn) one dose and was sent home on by mouth antibiotics. Patient was asked to followup next day. Patient followed up last evening. Her cellulitis had worsened and had gone beyond the markings. Patient has been admitted for further workup and management.  Right Lower Extremity Cellulitis.  This is the 3rd episode of right lower extremity cellulitis.  Ms. Arca was admitted and continued on IV Vancomycin and Zosyn for 5 days.  Slowly the erythema and swelling in her RLE began to improve and she was changed to oral augmentin and doxycycline.  She will be on antibiotics for a total of 14 days.  Pulses in her RLE were palpable but weak.  ABIs were ordered and completed just before discharge on 2/21.  She understands she needs PCP follow up for her RLE cellulitis.  Diabetes.  Ms Lites's hemoglobin A1c during this hospitalization was 10.  At the time of admission she was on both Lantus  and 70/30 as well as metformin. She was seen by the diabetic coordinators who recommended discontinuing 70/30 and instituting meal coverage with NovoLog in concert with Lantus at bedtime.  The patient's CBGs were brought down into the 150-220 range.  She will need to have focused followup to manage her diabetes long-term.  The patient was offered outpatient diabetic education but refused it politely as she had been through a course before.  Hypertension.  Blood pressures stayed on the high side during this hospitalization with a systolic blood pressure between 130 and 160. She was not started on new blood pressure medication, but rather maintained on 200 mg of labetalol twice a day.  PCOS.  She was maintained on Metformin.  Outpatient follow up with a PCP / Endocrinologist was strongly  encouraged.   Physical Exam on Discharge: General: Alert, awake, oriented x3, in no acute distress. HEENT: No bruits, no goiter. Heart: Regular rate and rhythm, without murmurs, rubs, gallops. Lungs: Clear to auscultation bilaterally. Abdomen: Soft, nontender, nondistended, positive bowel sounds. Extremities: No clubbing cyanosis or edema with positive pedal pulses. Neuro: Grossly intact, nonfocal.  Filed Vitals:   03/01/11 0912 03/01/11 1700 03/01/11 2201 03/02/11 0554  BP: 120/77 158/94 136/81 133/84  Pulse: 109 93 95 92  Temp:  97.4 F (36.3 C) 98 F (36.7 C) 97.6 F (36.4 C)  TempSrc:   Oral Oral  Resp:  18 20 22   Height:      Weight:      SpO2:  95% 97% 97%     Intake/Output Summary (Last 24 hours) at 03/02/11 1205 Last data filed at 03/02/11 1003  Gross per 24 hour  Intake   1020 ml  Output      0 ml  Net   1020 ml    Basic Metabolic Panel:  Lab 02/28/11 1308 02/27/11 0503  NA 139 136  K 4.1 3.8  CL 104 101  CO2 25 25  GLUCOSE 220* 206*  BUN 6 7  CREATININE 0.62 0.58  CALCIUM 9.7 9.5  MG -- --  PHOS -- --   Liver Function Tests:  Lab 02/27/11 0503  AST 27  ALT 33  ALKPHOS 66  BILITOT 0.4  PROT 6.8  ALBUMIN 3.1*   No results found for this basename: LIPASE:2,AMYLASE:2 in the last 168 hours No results found for this basename: AMMONIA:2 in the last 168 hours CBC:  Lab 03/02/11 0638 03/01/11 0559 02/27/11 0503 02/26/11 1750  WBC 12.2* 14.7* -- --  NEUTROABS -- -- 10.8* 15.0*  HGB 12.6 13.0 -- --  HCT 37.8 40.7 -- --  MCV 99.0 100.2* -- --  PLT 255 251 -- --   Cardiac Enzymes:  Lab 02/27/11 0037  CKTOTAL 35  CKMB --  CKMBINDEX --  TROPONINI --   CBG:  Lab 03/02/11 1159 03/02/11 0743 03/01/11 2155 03/01/11 1703 03/01/11 1215 03/01/11 0803  GLUCAP 142* 201* 235* 226* 208* 182*   Hemoglobin A1C:  Lab 02/27/11 0037  HGBA1C 10.0*   Fasting Lipid Panel:  Lab 03/01/11 0559  CHOL 197  HDL 38*  LDLCALC 118*  TRIG 206*  CHOLHDL  5.2  LDLDIRECT --    Significant Diagnostic Studies:   Ankle Brachial Indices completed 2/21.  Please follow up on results.      Disposition and Follow-up: Stable for discharge to home with PCP follow up.  [facility] has been called by this PA-c.  The patient is to be seen by Dr. Everardo All if he has an  opening available with in 30 days (as he is both a PCP and Endocrinologist).  Otherwise she is to see Dr. Yetta Allen as he has an appointment available 3/20.  The [facility] will contact the patient directly to schedule the appointment.  Discharge Orders    Future Orders Please Complete By Expires   Diet - low sodium heart healthy      Increase activity slowly      Discharge instructions      Comments:   See Physician to follow up on:   1.  Rt Leg Cellulitis 2.  Ankle Brachial Index Testing Results 3.  Diabetic control 4.  PCOS Management 5. High Cholesterol and high triglycerides     Follow-up Information    Follow up with Romero Belling, MD. (Dr. George Hugh office will call you with an appointment to be seen in 1 month or less.  If Dr. Everardo All is unavailable you may see Dr. Yetta Allen.)    Contact information:   [address] [facility] [address] [place] [address] [phone]           Time spent on Discharge: 40 min.  SignedStephani Police 03/02/2011, 12:05 PM [phone]

## 2011-03-02 NOTE — Progress Notes (Signed)
VASCULAR LAB PRELIMINARY  PRELIMINARY  PRELIMINARY  PRELIMINARY  ARTERIAL  ABI completed:    RIGHT    LEFT    PRESSURE WAVEFORM  PRESSURE WAVEFORM  BRACHIAL 156  Triphasic BRACHIAL 165 Triphasic  DP 163 Triphasic DP 186 Triphasic  PT 187 Triphasic PT 189 Triphasic                         RIGHT LEFT  ABI 1.13 1.15   ABIs and Doppler waveforms are normal bilaterally at rest   Asta Corbridge D, RVS  03/02/2011, 1:21 PM

## 2011-03-02 NOTE — Discharge Summary (Signed)
Patient seen and examined by me.  Plan to discharge today.  Patient needs close follow up to ensure resolution of cellulitis, tight BS monitoring.  Marlin Canary, DO

## 2011-09-27 ENCOUNTER — Encounter: Payer: Self-pay | Admitting: Obstetrics and Gynecology

## 2011-09-27 ENCOUNTER — Ambulatory Visit (INDEPENDENT_AMBULATORY_CARE_PROVIDER_SITE_OTHER): Payer: BC Managed Care – PPO | Admitting: Obstetrics and Gynecology

## 2011-09-27 ENCOUNTER — Telehealth: Payer: Self-pay | Admitting: Obstetrics and Gynecology

## 2011-09-27 VITALS — BP 132/76 | Ht 65.0 in | Wt 300.0 lb

## 2011-09-27 DIAGNOSIS — E282 Polycystic ovarian syndrome: Secondary | ICD-10-CM

## 2011-09-27 LAB — POCT URINE PREGNANCY: Preg Test, Ur: NEGATIVE

## 2011-09-27 LAB — POCT WET PREP (WET MOUNT)
Clue Cells Wet Prep Whiff POC: NEGATIVE
PH, VAGINAL: 5.5

## 2011-09-27 MED ORDER — FLUCONAZOLE 150 MG PO TABS: 150.0000 mg | ORAL_TABLET | Freq: Once | ORAL | Status: DC

## 2011-09-27 NOTE — Patient Instructions (Signed)
Polycystic Ovarian Syndrome Polycystic ovarian syndrome is a condition with a number of problems. One problem is with the ovaries. The ovaries are organs located in the female pelvis, on each side of the uterus. Usually, during the menstrual cycle, an egg is released from 1 ovary every month. This is called ovulation. When the egg is fertilized, it goes into the womb (uterus), which allows for the growth of a baby. The egg travels from the ovary through the fallopian tube to the uterus. The ovaries also make the hormones estrogen and progesterone. These hormones help the development of a woman's breasts, body shape, and body hair. They also regulate the menstrual cycle and pregnancy. Sometimes, cysts form in the ovaries. A cyst is a fluid-filled sac. On the ovary, different types of cysts can form. The most common type of ovarian cyst is called a functional or ovulation cyst. It is normal, and often forms during the normal menstrual cycle. Each month, a woman's ovaries grow tiny cysts that hold the eggs. When an egg is fully grown, the sac breaks open. This releases the egg. Then, the sac which released the egg from the ovary dissolves. In one type of functional cyst, called a follicle cyst, the sac does not break open to release the egg. It may actually continue to grow. This type of cyst usually disappears within 1 to 3 months.  One type of cyst problem with the ovaries is called Polycystic Ovarian Syndrome (PCOS). In this condition, many follicle cysts form, but do not rupture and produce an egg. This health problem can affect the following:  Menstrual cycle.   Heart.   Obesity.   Cancer of the uterus.   Fertility.   Blood vessels.   Hair growth (face and body) or baldness.   Hormones.   Appearance.   High blood pressure.   Stroke.   Insulin production.   Inflammation of the liver.   Elevated blood cholesterol and triglycerides.  CAUSES   No one knows the exact cause of PCOS.    Women with PCOS often have a mother or sister with PCOS. There is not yet enough proof to say this is inherited.   Many women with PCOS have a weight problem.   Researchers are looking at the relationship between PCOS and the body's ability to make insulin. Insulin is a hormone that regulates the change of sugar, starches, and other food into energy for the body's use, or for storage. Some women with PCOS make too much insulin. It is possible that the ovaries react by making too many female hormones, called androgens. This can lead to acne, excessive hair growth, weight gain, and ovulation problems.   Too much production of luteinizing hormone (LH) from the pituitary gland in the brain stimulates the ovary to produce too much female hormone (androgen).  SYMPTOMS   Infrequent or no menstrual periods, and/or irregular bleeding.   Inability to get pregnant (infertility), because of not ovulating.   Increased growth of hair on the face, chest, stomach, back, thumbs, thighs, or toes.   Acne, oily skin, or dandruff.   Pelvic pain.   Weight gain or obesity, usually carrying extra weight around the waist.   Type 2 diabetes (this is the diabetes that usually does not need insulin).   High cholesterol.   High blood pressure.   Female-pattern baldness or thinning hair.   Patches of thickened and dark brown or black skin on the neck, arms, breasts, or thighs.   Skin tags,   or tiny excess flaps of skin, in the armpits or neck area.   Sleep apnea (excessive snoring and breathing stops at times while asleep).   Deepening of the voice.   Gestational diabetes when pregnant.   Increased risk of miscarriage with pregnancy.  DIAGNOSIS  There is no single test to diagnose PCOS.   Your caregiver will:   Take a medical history.   Perform a pelvic exam.   Perform an ultrasound.   Check your female and female hormone levels.   Measure glucose or sugar levels in the blood.   Do other blood  tests.   If you are producing too many female hormones, your caregiver will make sure it is from PCOS. At the physical exam, your caregiver will want to evaluate the areas of increased hair growth. Try to allow natural hair growth for a few days before the visit.   During a pelvic exam, the ovaries may be enlarged or swollen by the increased number of small cysts. This can be seen more easily by vaginal ultrasound or screening, to examine the ovaries and lining of the uterus (endometrium) for cysts. The uterine lining may become thicker, if there has not been a regular period.  TREATMENT  Because there is no cure for PCOS, it needs to be managed to prevent problems. Treatments are based on your symptoms. Treatment is also based on whether you want to have a baby or whether you need contraception.  Treatment may include:  Progesterone hormone, to start a menstrual period.   Birth control pills, to make you have regular menstrual periods.   Medicines to make you ovulate, if you want to get pregnant.   Medicines to control your insulin.   Medicine to control your blood pressure.   Medicine and diet, to control your high cholesterol and triglycerides in your blood.   Surgery, making small holes in the ovary, to decrease the amount of female hormone production. This is done through a long, lighted tube (laparoscope), placed into the pelvis through a tiny incision in the lower abdomen.  Your caregiver will go over some of the choices with you. WOMEN WITH PCOS HAVE THESE CHARACTERISTICS:  High levels of female hormones called androgens.   An irregular or no menstrual cycle.   May have many small cysts in their ovaries.  PCOS is the most common hormonal reproductive problem in women of childbearing age. WHY DO WOMEN WITH PCOS HAVE TROUBLE WITH THEIR MENSTRUAL CYCLE? Each month, about 20 eggs start to mature in the ovaries. As one egg grows and matures, the follicle breaks open to release the egg,  so it can travel through the fallopian tube for fertilization. When the single egg leaves the follicle, ovulation takes place. In women with PCOS, the ovary does not make all of the hormones it needs for any of the eggs to fully mature. They may start to grow and accumulate fluid, but no one egg becomes large enough. Instead, some may remain as cysts. Since no egg matures or is released, ovulation does not occur and the hormone progesterone is not made. Without progesterone, a woman's menstrual cycle is irregular or absent. Also, the cysts produce female hormones, which continue to prevent ovulation.  Document Released: 04/21/2004 Document Revised: 12/15/2010 Document Reviewed: 11/13/2008 ExitCare Patient Information 2012 [employer]. 

## 2011-09-27 NOTE — Addendum Note (Signed)
Addended by: Rolla Plate on: 09/27/2011 04:20 PM   Modules accepted: Orders

## 2011-09-27 NOTE — Progress Notes (Signed)
Color: yellow Odor: no Itching:yes Thin:yes Thick:no Fever:no Dyspareunia:no Hx PID:no HX STD:no Pelvic Pain:no Desires Gc/CT:no Desires HIV,RPR,HbsAG:no  Pt states had some spotting 09/11/11 but before then no cycle for 3 months and not a normal cycle sine 10/2010.  Pt with known PCOS.  She also c/o itching.  She took diflucan but still has sxs BP 132/76  Ht 5\' 5"  (1.651 m)  Wt 300 lb (136.079 kg)  BMI 49.92 kg/m2  LMP 09/11/2011 Physical Examination: General appearance - alert, well appearing, and in no distress Heart - normal rate and regular rhythm Abdomen - soft, nontender, nondistended, no masses or organomegaly Pelvic - normal external genitalia, vulva, vagina, cervix, uterus and adnexa scant spotting, WET MOUNT done - results: vaginal pH is 5.5 with.  occ red blood cells seen PCOS Amenorrhea Pt usually does not have a withdrawal with provera.  Lo/lo estrin given.  Call with menses to schedule SHG/EMBx cytotec 4-6 hrs before procedure Check upt today

## 2011-10-25 ENCOUNTER — Telehealth: Payer: Self-pay | Admitting: Obstetrics and Gynecology

## 2011-10-25 MED ORDER — NORGESTIMATE-ETH ESTRADIOL 0.25-35 MG-MCG PO TABS: 1.0000 | ORAL_TABLET | Freq: Every day | ORAL | Status: DC

## 2011-10-25 NOTE — Telephone Encounter (Signed)
Lm on vm tcb rgd msg 

## 2011-10-25 NOTE — Telephone Encounter (Signed)
Spoke with p t rgd msg pt states on lo lo estrin still spotting wants to if she to continue lo Loestrin or schd sonohyst advised pt per ND try ortho cyclen for 7 days to see if bleeding stops in still bleeding call office to schd sonohyst pt voice understanding

## 2011-11-13 ENCOUNTER — Telehealth: Payer: Self-pay

## 2011-11-13 NOTE — Telephone Encounter (Signed)
Lm on vm tcb rgd msg 

## 2011-11-13 NOTE — Telephone Encounter (Signed)
Spoke with pt rgd msg informed pt per ND will still do sonohyt on Thursday while bleeding pt voice understanding

## 2011-11-16 ENCOUNTER — Other Ambulatory Visit: Payer: Self-pay | Admitting: Obstetrics and Gynecology

## 2011-11-16 ENCOUNTER — Ambulatory Visit (INDEPENDENT_AMBULATORY_CARE_PROVIDER_SITE_OTHER): Payer: BC Managed Care – PPO | Admitting: Obstetrics and Gynecology

## 2011-11-16 ENCOUNTER — Ambulatory Visit (INDEPENDENT_AMBULATORY_CARE_PROVIDER_SITE_OTHER): Payer: BC Managed Care – PPO

## 2011-11-16 VITALS — BP 130/90 | Ht 65.0 in | Wt 292.0 lb

## 2011-11-16 DIAGNOSIS — N926 Irregular menstruation, unspecified: Secondary | ICD-10-CM

## 2011-11-16 DIAGNOSIS — E282 Polycystic ovarian syndrome: Secondary | ICD-10-CM

## 2011-11-16 MED ORDER — NORGESTIMATE-ETH ESTRADIOL 0.25-35 MG-MCG PO TABS: 1.0000 | ORAL_TABLET | Freq: Every day | ORAL | Status: DC

## 2011-11-16 NOTE — Patient Instructions (Signed)
Endometrial Biopsy This is a test in which a tissue sample (a biopsy) is taken from inside the uterus (womb). It is then looked at by a specialist under a microscope to see if the tissue is normal or abnormal. The endometrium is the lining of the uterus. This test helps determine where you are in your menstrual cycle and how hormone levels are affecting the lining of the uterus. Another use for this test is to diagnose endometrial cancer, tuberculosis, polyps, or inflammatory conditions and to evaluate uterine bleeding. PREPARATION FOR TEST No preparation or fasting is necessary. NORMAL FINDINGS No pathologic conditions. Presence of "secretory-type" endometrium 3 to 5 days before to normal menstruation. Ranges for normal findings may vary among different laboratories and hospitals. You should always check with your doctor after having lab work or other tests done to discuss the meaning of your test results and whether your values are considered within normal limits. MEANING OF TEST  Your caregiver will go over the test results with you and discuss the importance and meaning of your results, as well as treatment options and the need for additional tests if necessary. OBTAINING THE TEST RESULTS It is your responsibility to obtain your test results. Ask the lab or department performing the test when and how you will get your results. Document Released: 04/28/2004 Document Revised: 03/20/2011 Document Reviewed: 12/06/2007 ExitCare Patient Information 2013 [employer].  

## 2011-11-16 NOTE — Progress Notes (Signed)
SHG/EMBX:  The patient was consented for both procedures.  She was placed in dorsal lithotomy position and speculum placed in the vagina.  The cervix was cleansed with three betadine swabs.  The endometrial pipet was placed in the the endometrial cavity through the cervix.  The uterus did sound to 8cm.  The pipet was removed and specimen was sent to pathology.  The sonohysterography catheter was then placed through the cervix and vaginal probe placed back in the vagina. US findings 8.04 cm by 4.35 cm with normal appearing adnexa 1.2 cm polyp seen Pt given options of obs, ocps, d&C  Pt chose to stay on orthocylen and repeat US in three months.

## 2011-12-11 ENCOUNTER — Ambulatory Visit (INDEPENDENT_AMBULATORY_CARE_PROVIDER_SITE_OTHER): Payer: BC Managed Care – PPO | Admitting: Obstetrics and Gynecology

## 2011-12-11 ENCOUNTER — Encounter: Payer: Self-pay | Admitting: Obstetrics and Gynecology

## 2011-12-11 VITALS — BP 132/82 | Wt 293.0 lb

## 2011-12-11 DIAGNOSIS — C549 Malignant neoplasm of corpus uteri, unspecified: Secondary | ICD-10-CM

## 2011-12-11 DIAGNOSIS — C541 Malignant neoplasm of endometrium: Secondary | ICD-10-CM

## 2011-12-11 NOTE — Progress Notes (Signed)
Discuss lab results  Pt told her Endometrial bx was sig for endometrial cancer She understands the dx and will be referred to GYN/ONC She does want to maintain childbearing F/u 4-6 weeks

## 2011-12-12 ENCOUNTER — Telehealth: Payer: Self-pay

## 2011-12-12 NOTE — Telephone Encounter (Signed)
Referral to gyn/onc pt has appt 12/20/11 at 11:30 pt voice understanding

## 2011-12-15 ENCOUNTER — Telehealth: Payer: Self-pay

## 2011-12-15 NOTE — Telephone Encounter (Signed)
Tc from pt c/o still having vaginal itching wants rx for diflucan pt states was told can call back to get rx if itching not resolved

## 2011-12-16 NOTE — Telephone Encounter (Signed)
Pt may have diflucan 150 mg PO times one.  Disp #1 with one refill

## 2011-12-18 ENCOUNTER — Other Ambulatory Visit: Payer: Self-pay

## 2011-12-19 ENCOUNTER — Other Ambulatory Visit: Payer: Self-pay

## 2011-12-19 MED ORDER — FLUCONAZOLE 150 MG PO TABS: 150.0000 mg | ORAL_TABLET | Freq: Once | ORAL | Status: DC

## 2011-12-19 NOTE — Telephone Encounter (Signed)
Lm for pt to call back, sent diflucan to [facility] on [address].

## 2011-12-20 ENCOUNTER — Telehealth: Payer: Self-pay

## 2011-12-20 ENCOUNTER — Encounter: Payer: Self-pay | Admitting: Gynecologic Oncology

## 2011-12-20 ENCOUNTER — Ambulatory Visit: Payer: BC Managed Care – PPO | Attending: Gynecologic Oncology | Admitting: Gynecologic Oncology

## 2011-12-20 ENCOUNTER — Ambulatory Visit: Payer: BC Managed Care – PPO | Admitting: Lab

## 2011-12-20 VITALS — BP 136/84 | HR 80 | Temp 98.2°F | Resp 16 | Ht 65.0 in | Wt 291.0 lb

## 2011-12-20 DIAGNOSIS — Z801 Family history of malignant neoplasm of trachea, bronchus and lung: Secondary | ICD-10-CM | POA: Insufficient documentation

## 2011-12-20 DIAGNOSIS — Z7982 Long term (current) use of aspirin: Secondary | ICD-10-CM | POA: Insufficient documentation

## 2011-12-20 DIAGNOSIS — C541 Malignant neoplasm of endometrium: Secondary | ICD-10-CM

## 2011-12-20 DIAGNOSIS — E282 Polycystic ovarian syndrome: Secondary | ICD-10-CM | POA: Insufficient documentation

## 2011-12-20 DIAGNOSIS — N840 Polyp of corpus uteri: Secondary | ICD-10-CM | POA: Insufficient documentation

## 2011-12-20 DIAGNOSIS — I10 Essential (primary) hypertension: Secondary | ICD-10-CM | POA: Insufficient documentation

## 2011-12-20 DIAGNOSIS — C549 Malignant neoplasm of corpus uteri, unspecified: Secondary | ICD-10-CM | POA: Insufficient documentation

## 2011-12-20 DIAGNOSIS — E119 Type 2 diabetes mellitus without complications: Secondary | ICD-10-CM | POA: Insufficient documentation

## 2011-12-20 DIAGNOSIS — Z79899 Other long term (current) drug therapy: Secondary | ICD-10-CM | POA: Insufficient documentation

## 2011-12-20 HISTORY — DX: Malignant neoplasm of endometrium: C54.1

## 2011-12-20 LAB — BUN AND CREATININE (CC13)
BUN: 13 mg/dL (ref 7.0–26.0)
Creatinine: 0.7 mg/dL (ref 0.6–1.1)

## 2011-12-20 NOTE — Telephone Encounter (Signed)
Tc to pt, advised that diflucan sent to pharmacy. Pt voiced understanding.

## 2011-12-20 NOTE — Patient Instructions (Signed)
Keep MRI appointment on 12/18 at 08:00

## 2011-12-20 NOTE — Progress Notes (Signed)
Consult Note: Gyn-Onc  Kelli Allen 36 y.o. female  CC:  Chief Complaint  Patient presents with  . Endometrial cancer    New Consult    HPI: Patient is seen today in consultation at the request of Dr. Normand Sloop.  Patient is a 36 year old gravida 0 para 0 who remembers having irregular periods in which he passed some tissue in October of 2012. She was seen by Dr. Normand Sloop and apparently Dr. Normand Sloop wanted to perform a history of sonogram but the patient declined. She was seen in followup with Dr. Normand Sloop in September was started on birth control pills. In October or November she had some spotting and it was fairly regular for a few days. She ultimately underwent a hysteroscopy D&C that Dr. Normand Sloop and 1 he to perform and underwent an endometrial biopsy on November 7. [name] 41-year-old a polyp. The uterus measures 8.04 x 4.3 cm with normal appearing adnexa. There was a 1.2 cm polyp noted. She had an endometrial biopsy that revealed a grade 1 endometrial carcinoma arising atypical complex hyperplasia. The patient wishes to retain her fertility and subsequent comes in today still see [place].  She states that this past weekend she feels that she might have had a menstrual cycle on her birth control pills and she's had no bleeding since that time. She has a change about bladder habits or any abdominal or pelvic pain. She comes comes accompanied today by a friend as her husband is working out of town. She states that starting at the age of 26 she was told she had a hormonal imbalance secondary to polycystic ovarian syndrome and gained a large amount of weight at that time. She states  that her cycles have been irregular since that time. As she became older she was told that she needed to lose weight and her cycles would become normal but that was never the case. She does have diabetes and hypertension is under the care of Dr. Doreatha Martin and Dr. Corwin Levins for this. Her last A1c is 7.9  down from 9 last year. She's lost 12 pounds in the last 3 months without trying". There have been medication changes during that time. Her sugars run about 150-180. She states that every time she tries to lose weight she gained weight. She does not exercise. However, she has made a conscious effort yourself in situations where she does get more " movement". She has to walk a quarter mouth her desk, she has a second story apartment.   Review of Systems: 10 point review of systems is negative.  Current Meds:  Outpatient Encounter Prescriptions as of 12/20/2011  Medication Sig Dispense Refill  . Canagliflozin (INVOKANA) 300 MG TABS Take by mouth.      . doxycycline (VIBRAMYCIN) 100 MG capsule Take 100 mg by mouth 2 (two) times daily.      . fluconazole (DIFLUCAN) 150 MG tablet Take 1 tablet (150 mg total) by mouth once.  1 tablet  1  . gabapentin (NEURONTIN) 100 MG capsule Take 100 mg by mouth 3 (three) times daily.      . insulin aspart (NOVOLOG) 100 UNIT/ML injection Inject 15 Units into the skin 3 (three) times daily with meals.  1 vial  0  . insulin glargine (LANTUS) 100 UNIT/ML injection Inject 70 Units into the skin at bedtime.  10 mL  0  . labetalol (NORMODYNE) 200 MG tablet Take 200 mg by mouth 2 (two) times daily.      Marland Kitchen  Liraglutide (VICTOZA) 18 MG/3ML SOLN Inject into the skin.      Marland Kitchen lisinopril-hydrochlorothiazide (PRINZIDE,ZESTORETIC) 20-12.5 MG per tablet Take 1 tablet by mouth daily.      . metformin (FORTAMET) 500 MG (OSM) 24 hr tablet Take 500 mg by mouth 2 (two) times daily with a meal.        . norgestimate-ethinyl estradiol (ORTHO-CYCLEN, 28,) 0.25-35 MG-MCG tablet Take 1 tablet by mouth daily.  1 Package  0  . OVER THE COUNTER MEDICATION Take 1 packet by mouth daily. Nutralite Vitamins      . norgestimate-ethinyl estradiol (ORTHO-CYCLEN, 28,) 0.25-35 MG-MCG tablet Take 1 tablet by mouth daily.  1 Package  11  . omega-3 acid ethyl esters (LOVAZA) 1 G capsule Take 1 capsule (1 g  total) by mouth 2 (two) times daily.  30 capsule  0    Allergy: No Known Allergies  Social Hx:  She denies tobacco. She drinks alcohol socially she has a drug use. Her husband is with an Facilities manager but she is married. She works in Clinical biochemist for [employer].  History   Social History  . Marital Status: Married    Spouse Name: N/A    Number of Children: N/A  . Years of Education: N/A   Occupational History  . Not on file.   Social History Main Topics  . Smoking status: Never Smoker   . Smokeless tobacco: Not on file  . Alcohol Use: Yes     Comment: social  . Drug Use: No  . Sexually Active: Yes    Birth Control/ Protection: Condom, Pill     Comment: orthocyclen    Other Topics Concern  . Not on file   Social History Narrative  . No narrative on file    Past Surgical Hx: No past surgical history on file.  Past Medical Hx:  Past Medical History  Diagnosis Date  . Diabetes mellitus   . Polycystic ovarian syndrome   . Cellulitis of lower leg   . Hypertension   . Asthma   . Irregular menstrual bleeding   . PCOS (polycystic ovarian syndrome)   . Tachycardia   . Abnormal Pap smear 2000  . Endometrial ca 12/20/2011    Family Hx: She is one of 7 girls. Although 2 of her sisters have depression. She has one sister with mitochondrial myopathy. Her paternal grandfather died of lung cancer she was a smoker. Maternal grandmother may have had bone "cancer" Family History  Problem Relation Age of Onset  . Diabetes type II Mother   . Coronary artery disease Mother   . Diabetes type II Father   . Coronary artery disease Father   . Heart disease Neg Hx     Vitals:  Blood pressure 136/84, pulse 80, temperature 98.2 F (36.8 C), temperature source Oral, resp. rate 16, height 5\' 5"  (1.651 m), weight 291 lb (131.997 kg), last menstrual period 12/15/2011.  Physical Exam:  Well-nourished well-developed female in no acute distress. BMI 38.5  Neck: Supple,  no lymphadenopathy, no thyromegaly.  Lungs: Distant breath sounds. Otherwise clear to auscultation.  Cardiovascular: Regular rate and rhythm.  Abdomen: Morbidly obese. Soft, nondistended, but no palpable masses or hepatosplenomegaly but exam is limited by habitus.  Groins: No lymphadenopathy.  Extremities: 1+ nonpitting edema equal bilaterally.  Pelvic: Normal external female genitalia. Vagina is well epithelialized. The cervix is nulliparous. There's no gross visible lesions. Bimanual examination is limited by her habitus. The uterus is not  Enlarged, however I cannot  distinctly palpate the uterus. There are no adnexal masses.  Assessment/Plan: 36 year old gravida 0 with a grade 1 endometrial carcinoma as well as an endometrial polyp on sonohysterogram. The patient wishes to maintain fertility. I discussed with her the standard of care for the treatment of endometrial cancer would be a hysterectomy. However should she wished to retain fertility that can be considered. However several consecutive steps need to occur:  #1 we need to obtain an MRI of the abdomen and pelvis. If there is no evidence of deep myometrial invasion on MRI and no lymphadenopathy he can then consider going to  #2 undergoing a D&C Mirena placement.  If Dr. Normand Sloop is available we would ask if she were able to do this in the operating room for easy in scheduling for the patient's benefit. She would then have to undergo endometrial biopsies every 3 months. I discussed with her that we would allow one years time for her to reverse this process prior needing to consider definitive surgical management. During that time she was told that she would need to make a concerted effort to lose weight and to begin exercise. Such that she will be healthier and potentially be a lower risk pregnancy should she be able to conceive. I discussed with her that while our success rates to reversing the cancer are quite good, pregnancy rates are  quite low as many times in women who have these conditions have a difficult time conceiving.  Her questions regarding this were elicited in addition her satisfaction. She does understand that if the MRI shows invasion or if the biopsies do not normalize that we would have to consider surgery at that time and she's willing to consider that she just wanted to proceed with any options that preserve fertility at this time. She scheduled for an MRI December 18 at 8:00. She will call if she has any questions prior to that time. We will call her with the results of her MRI.  Tenea Sens A., MD 12/20/2011, 1:49 PM

## 2011-12-27 ENCOUNTER — Ambulatory Visit (HOSPITAL_COMMUNITY)
Admission: RE | Admit: 2011-12-27 | Discharge: 2011-12-27 | Disposition: A | Discharge status: Home / Self Care | Payer: BC Managed Care – PPO | Source: Ambulatory Visit | Attending: Gynecologic Oncology | Admitting: Gynecologic Oncology

## 2011-12-27 ENCOUNTER — Telehealth: Payer: Self-pay | Admitting: Gynecologic Oncology

## 2011-12-27 DIAGNOSIS — C541 Malignant neoplasm of endometrium: Secondary | ICD-10-CM

## 2011-12-27 DIAGNOSIS — C549 Malignant neoplasm of corpus uteri, unspecified: Secondary | ICD-10-CM | POA: Insufficient documentation

## 2011-12-27 MED ORDER — GADOBENATE DIMEGLUMINE 529 MG/ML IV SOLN
20.0000 mL | Freq: Once | INTRAVENOUS | Status: AC | PRN
  Administered 2011-12-27: 20 mL via INTRAVENOUS

## 2011-12-27 NOTE — Telephone Encounter (Signed)
Spoke with patient regarding her MRI findings. There is no evidence of any extrauterine disease. In addition, there was no evidence of myometrial invasion. She very much still desires to maintain fertility an attempt conception. We will contact Dr. Redmond Baseman office regarding obtaining an appointment for a D&C with IUD placement.

## 2012-01-02 ENCOUNTER — Telehealth: Payer: Self-pay | Admitting: Obstetrics and Gynecology

## 2012-01-02 NOTE — Telephone Encounter (Signed)
LEFT MESSAGE  ON VM CONCERNING F/U ON DR. Duard Brady FINDINGS. INFORMED PT THAT ND WILL F/U THAT VISIT ON 01-22-12.

## 2012-01-08 ENCOUNTER — Telehealth: Payer: Self-pay

## 2012-01-08 NOTE — Telephone Encounter (Signed)
Message copied by Rolla Plate on Mon Jan 08, 2012  8:50 AM ------      Message from: Jaymes Graff      Created: Sun Jan 07, 2012  2:22 PM       Please schedule pt for mirena IUD in the office under US guidance with cervical block for 30 min.  Nothing double booked with it

## 2012-01-08 NOTE — Telephone Encounter (Signed)
Spoke with pt rgd scheduling  iud insertion under u/s guidance pt states she suppose to have d&c with iud insertion advised pt will consult with nd and call her back pt voic understanding

## 2012-01-11 NOTE — Telephone Encounter (Signed)
Tell her I will schedule with Adrianne

## 2012-01-11 NOTE — Telephone Encounter (Signed)
Spoke with pt rgd msg informed surgery coordinator will cal to schd d&C and iud insertion pt voice understanding

## 2012-01-16 ENCOUNTER — Telehealth: Payer: Self-pay | Admitting: Obstetrics and Gynecology

## 2012-01-16 ENCOUNTER — Encounter (HOSPITAL_COMMUNITY): Payer: Self-pay | Admitting: Pharmacist

## 2012-01-16 NOTE — Telephone Encounter (Signed)
TC to patient, to discuss surgery. Patient has concerns about about Vulva irritation. Patient states it "itches like fire" and feels like little bumps.  Skin peels a lot in that area.  Patient wanted to make sure she didn't need to be treated for ?infection prior to surgery. -Adrianne Pridgen

## 2012-01-18 ENCOUNTER — Telehealth: Payer: Self-pay | Admitting: Obstetrics and Gynecology

## 2012-01-18 ENCOUNTER — Other Ambulatory Visit: Payer: Self-pay | Admitting: Obstetrics and Gynecology

## 2012-01-18 NOTE — Telephone Encounter (Signed)
D&C Hysteroscopy and Mirena IUD insertion scheduled for 01/24/12 @ 1:00pm with ND. [employer] effective 05/10/11.  Plan pays 80/20 after a $1,000 deductible. -Adrianne Pridgen

## 2012-01-19 ENCOUNTER — Other Ambulatory Visit: Payer: Self-pay | Admitting: Obstetrics and Gynecology

## 2012-01-19 ENCOUNTER — Ambulatory Visit (INDEPENDENT_AMBULATORY_CARE_PROVIDER_SITE_OTHER): Payer: BC Managed Care – PPO | Admitting: Obstetrics and Gynecology

## 2012-01-19 ENCOUNTER — Encounter: Payer: Self-pay | Admitting: Obstetrics and Gynecology

## 2012-01-19 VITALS — BP 112/68 | HR 100 | Temp 98.0°F | Wt 283.0 lb

## 2012-01-19 DIAGNOSIS — N762 Acute vulvitis: Secondary | ICD-10-CM

## 2012-01-19 DIAGNOSIS — C549 Malignant neoplasm of corpus uteri, unspecified: Secondary | ICD-10-CM

## 2012-01-19 DIAGNOSIS — C541 Malignant neoplasm of endometrium: Secondary | ICD-10-CM

## 2012-01-19 DIAGNOSIS — N760 Acute vaginitis: Secondary | ICD-10-CM

## 2012-01-19 MED ORDER — CLOBETASOL PROPIONATE 0.05 % EX CREA: TOPICAL_CREAM | Freq: Two times a day (BID) | CUTANEOUS | Status: DC

## 2012-01-19 NOTE — Patient Instructions (Signed)
Intrauterine Device Information  An intrauterine device (IUD) is inserted into your uterus and prevents pregnancy. There are 2 types of IUDs available:  · Copper IUD. This type of IUD is wrapped in copper wire and is placed inside the uterus. Copper makes the uterus and fallopian tubes produce a fluid that kills sperm. The copper IUD can stay in place for 10 years.  · Hormone IUD. This type of IUD contains the hormone progestin (synthetic progesterone). The hormone thickens the cervical mucus and prevents sperm from entering the uterus, and it also thins the uterine lining to prevent implantation of a fertilized egg. The hormone can weaken or kill the sperm that get into the uterus. The hormone IUD can stay in place for 5 years.  Your caregiver will make sure you are a good candidate for a contraceptive IUD. Discuss with your caregiver the possible side effects.  ADVANTAGES  · It is highly effective, reversible, long-acting, and low maintenance.  · There are no estrogen-related side effects.  · An IUD can be used when breastfeeding.  · It is not associated with weight gain.  · It works immediately after insertion.  · The copper IUD does not interfere with your female hormones.  · The progesterone IUD can make heavy menstrual periods lighter.  · The progesterone IUD can be used for 5 years.  · The copper IUD can be used for 10 years.  DISADVANTAGES  · The progesterone IUD can be associated with irregular bleeding patterns.  · The copper IUD can make your menstrual flow heavier and more painful.  · You may experience cramping and vaginal bleeding after insertion.  Document Released: 11/30/2003 Document Revised: 03/20/2011 Document Reviewed: 04/30/2010  ExitCare® Patient Information ©2013 [employer].

## 2012-01-19 NOTE — Progress Notes (Signed)
Pre-op Hysteroscopy D&C scheduled for 01/24/12  Pt would like labia area checked.  Pt still c/o itching BP 112/68  Pulse 100  Temp 98 F (36.7 C) (Oral)  Wt 283 lb (128.368 kg)  LMP 01/17/2012 Physical Examination: General appearance - alert, well appearing, and in no distress Chest - clear to auscultation, no wheezes, rales or rhonchi, symmetric air entry Heart - normal rate and regular rhythm Abdomen - soft, nontender, nondistended, no masses or organomegaly Pelvic - VULVA: normal appearing vulva with no masses, tenderness or lesions, vulvar lesion on the mons about 2 cm in size, another lesion on righ labia majora c/w skin tag, VAGINA: vaginal tenderness just above the clitoral hood, vaginal erythema just above the clitoral hood, CERVIX: normal appearing cervix without discharge or lesions, UTERUS: uterus is normal size, shape, consistency and nontender, ADNEXA: normal adnexa in size, nontender and no masses Vulvar lesions plan to do bxs with surgery recuurent yeast infections.  Did a yeast cx Plan D&C hysteroscopic removal of polyp and placement of mirena.  Pt has endometrial cancer and declnes a hysterectomy.  It is controversial to do hysteroscopy but the GYN ONC recommends it to help in removing the polyp which is the most like source of the cancer.   Pt agrees with the plan

## 2012-01-20 LAB — FUNGAL STAIN: Smear Result: NONE SEEN

## 2012-01-22 ENCOUNTER — Encounter: Payer: BC Managed Care – PPO | Admitting: Obstetrics and Gynecology

## 2012-01-22 ENCOUNTER — Encounter (HOSPITAL_COMMUNITY)
Admission: RE | Admit: 2012-01-22 | Discharge: 2012-01-22 | Disposition: A | Discharge status: Home / Self Care | Payer: BC Managed Care – PPO | Source: Ambulatory Visit | Attending: Obstetrics and Gynecology | Admitting: Obstetrics and Gynecology

## 2012-01-22 ENCOUNTER — Encounter (HOSPITAL_COMMUNITY): Payer: Self-pay

## 2012-01-22 LAB — CBC
Hemoglobin: 15.3 g/dL — ABNORMAL HIGH (ref 12.0–15.0)
MCH: 32.9 pg (ref 26.0–34.0)
MCHC: 33 g/dL (ref 30.0–36.0)
MCV: 99.6 fL (ref 78.0–100.0)
Platelets: 300 10*3/uL (ref 150–400)

## 2012-01-22 LAB — BASIC METABOLIC PANEL
Calcium: 10.4 mg/dL (ref 8.4–10.5)
Creatinine, Ser: 0.73 mg/dL (ref 0.50–1.10)
GFR calc non Af Amer: >90 mL/min (ref >90)
Glucose, Bld: 132 mg/dL — ABNORMAL HIGH (ref 70–99)
Sodium: 138 mEq/L (ref 135–145)

## 2012-01-22 NOTE — Patient Instructions (Addendum)
18 Kelli Allen  01/22/2012   Your procedure is scheduled on:  01/24/12  Enter through the Main Entrance of [facility] at 1130 AM.  Pick up the phone at the desk and dial [phone].   Call this number if you have problems the morning of surgery: [phone]   Remember:   Do not eat food:After Midnight.  Do not drink clear liquids: until 9AM day of surgery  Take these medicines the morning of surgery with A SIP OF WATER: Blood pressure medication. Hold Metformin 28yrs, take usual dinner insulin, and 1/2 dose of Bedtime insulin   Do not wear jewelry, make-up or nail polish.  Do not wear lotions, powders, or perfumes. You may wear deodorant.  Do not shave 48 hours prior to surgery.  Do not bring valuables to the hospital.  Contacts, dentures or bridgework may not be worn into surgery.  Leave suitcase in the car. After surgery it may be brought to your room.  For patients admitted to the hospital, checkout time is 11:00 AM the day of discharge.   Patients discharged the day of surgery will not be allowed to drive home.  Name and phone number of your driver: undecided  Special Instructions: Shower using CHG 2 nights before surgery and the night before surgery.  If you shower the day of surgery use CHG.  Use special wash - you have one bottle of CHG for all showers.  You should use approximately 1/3 of the bottle for each shower.   Please read over the following fact sheets that you were given: Surgical Site Infection Prevention

## 2012-01-24 ENCOUNTER — Encounter (HOSPITAL_COMMUNITY)
Admission: RE | Disposition: A | Discharge status: Home / Self Care | Payer: Self-pay | Source: Ambulatory Visit | Attending: Obstetrics and Gynecology

## 2012-01-24 ENCOUNTER — Ambulatory Visit (HOSPITAL_COMMUNITY): Payer: BC Managed Care – PPO | Admitting: Anesthesiology

## 2012-01-24 ENCOUNTER — Encounter (HOSPITAL_COMMUNITY): Payer: Self-pay | Admitting: Anesthesiology

## 2012-01-24 ENCOUNTER — Ambulatory Visit (HOSPITAL_COMMUNITY)
Admission: RE | Admit: 2012-01-24 | Discharge: 2012-01-24 | Disposition: A | Discharge status: Home / Self Care | Payer: BC Managed Care – PPO | Source: Ambulatory Visit | Attending: Obstetrics and Gynecology | Admitting: Obstetrics and Gynecology

## 2012-01-24 DIAGNOSIS — C541 Malignant neoplasm of endometrium: Secondary | ICD-10-CM

## 2012-01-24 DIAGNOSIS — Z30430 Encounter for insertion of intrauterine contraceptive device: Secondary | ICD-10-CM | POA: Insufficient documentation

## 2012-01-24 DIAGNOSIS — N840 Polyp of corpus uteri: Secondary | ICD-10-CM | POA: Insufficient documentation

## 2012-01-24 DIAGNOSIS — N949 Unspecified condition associated with female genital organs and menstrual cycle: Secondary | ICD-10-CM | POA: Insufficient documentation

## 2012-01-24 DIAGNOSIS — N938 Other specified abnormal uterine and vaginal bleeding: Secondary | ICD-10-CM | POA: Insufficient documentation

## 2012-01-24 DIAGNOSIS — Z01818 Encounter for other preprocedural examination: Secondary | ICD-10-CM | POA: Insufficient documentation

## 2012-01-24 DIAGNOSIS — C549 Malignant neoplasm of corpus uteri, unspecified: Secondary | ICD-10-CM | POA: Insufficient documentation

## 2012-01-24 DIAGNOSIS — Z01812 Encounter for preprocedural laboratory examination: Secondary | ICD-10-CM | POA: Insufficient documentation

## 2012-01-24 HISTORY — PX: HYSTEROSCOPY WITH D & C: SHX1775

## 2012-01-24 HISTORY — PX: INTRAUTERINE DEVICE (IUD) INSERTION: SHX5877

## 2012-01-24 LAB — GLUCOSE, CAPILLARY: Glucose-Capillary: 136 mg/dL — ABNORMAL HIGH (ref 70–99)

## 2012-01-24 LAB — HCG, QUANTITATIVE, PREGNANCY: hCG, Beta Chain, Quant, S: <1 m[IU]/mL (ref <5)

## 2012-01-24 SURGERY — DILATATION AND CURETTAGE /HYSTEROSCOPY
Anesthesia: General | Site: Uterus | Wound class: Clean Contaminated

## 2012-01-24 MED ORDER — ONDANSETRON HCL 4 MG/2ML IJ SOLN
INTRAMUSCULAR | Status: AC
  Filled 2012-01-24: qty 2

## 2012-01-24 MED ORDER — PROPOFOL 10 MG/ML IV EMUL
INTRAVENOUS | Status: DC | PRN
  Administered 2012-01-24: 100 mg via INTRAVENOUS

## 2012-01-24 MED ORDER — LEVONORGESTREL 20 MCG/24HR IU IUD
INTRAUTERINE_SYSTEM | Freq: Once | INTRAUTERINE | Status: AC
  Administered 2012-01-24: 1 via INTRAUTERINE

## 2012-01-24 MED ORDER — FENTANYL CITRATE 0.05 MG/ML IJ SOLN: 25.0000 ug | INTRAMUSCULAR | Status: DC | PRN

## 2012-01-24 MED ORDER — LIDOCAINE HCL (CARDIAC) 20 MG/ML IV SOLN
INTRAVENOUS | Status: AC
  Filled 2012-01-24: qty 5

## 2012-01-24 MED ORDER — FENTANYL CITRATE 0.05 MG/ML IJ SOLN
INTRAMUSCULAR | Status: AC
  Filled 2012-01-24: qty 2

## 2012-01-24 MED ORDER — LIDOCAINE HCL 2 % IJ SOLN
INTRAMUSCULAR | Status: DC | PRN
  Administered 2012-01-24: 20 mL
  Administered 2012-01-24: 10 mL

## 2012-01-24 MED ORDER — LACTATED RINGERS IV SOLN
INTRAVENOUS | Status: DC | PRN
  Administered 2012-01-24 (×2): via INTRAVENOUS

## 2012-01-24 MED ORDER — MIDAZOLAM HCL 2 MG/2ML IJ SOLN
INTRAMUSCULAR | Status: AC
  Filled 2012-01-24: qty 2

## 2012-01-24 MED ORDER — LACTATED RINGERS IV SOLN
INTRAVENOUS | Status: DC
  Administered 2012-01-24: 12:00:00 via INTRAVENOUS

## 2012-01-24 MED ORDER — PROPOFOL 10 MG/ML IV EMUL
INTRAVENOUS | Status: AC
  Filled 2012-01-24: qty 20

## 2012-01-24 MED ORDER — SILVER NITRATE-POT NITRATE 75-25 % EX MISC
CUTANEOUS | Status: DC | PRN
  Administered 2012-01-24: 2

## 2012-01-24 MED ORDER — LIDOCAINE HCL 2 % IJ SOLN
INTRAMUSCULAR | Status: AC
  Filled 2012-01-24: qty 20

## 2012-01-24 MED ORDER — IBUPROFEN 600 MG PO TABS: 600.0000 mg | ORAL_TABLET | Freq: Four times a day (QID) | ORAL | Status: DC | PRN

## 2012-01-24 MED ORDER — HYDROCODONE-ACETAMINOPHEN 5-500 MG PO TABS: 1.0000 | ORAL_TABLET | Freq: Four times a day (QID) | ORAL | Status: DC | PRN

## 2012-01-24 MED ORDER — FENTANYL CITRATE 0.05 MG/ML IJ SOLN
INTRAMUSCULAR | Status: DC | PRN
  Administered 2012-01-24: 100 ug via INTRAVENOUS
  Administered 2012-01-24: 50 ug via INTRAVENOUS

## 2012-01-24 MED ORDER — SODIUM CHLORIDE 0.9 % IR SOLN
Status: DC | PRN
  Administered 2012-01-24: 3000 mL

## 2012-01-24 MED ORDER — DEXAMETHASONE SODIUM PHOSPHATE 10 MG/ML IJ SOLN
INTRAMUSCULAR | Status: AC
  Filled 2012-01-24: qty 1

## 2012-01-24 MED ORDER — LIDOCAINE HCL (CARDIAC) 20 MG/ML IV SOLN
INTRAVENOUS | Status: DC | PRN
  Administered 2012-01-24: 100 mg via INTRAVENOUS

## 2012-01-24 MED ORDER — MEPERIDINE HCL 25 MG/ML IJ SOLN: 6.2500 mg | INTRAMUSCULAR | Status: DC | PRN

## 2012-01-24 MED ORDER — KETOROLAC TROMETHAMINE 30 MG/ML IJ SOLN
15.0000 mg | Freq: Once | INTRAMUSCULAR | Status: AC | PRN
  Administered 2012-01-24: 30 mg via INTRAVENOUS

## 2012-01-24 MED ORDER — GLYCOPYRROLATE 0.2 MG/ML IJ SOLN
INTRAMUSCULAR | Status: AC
  Filled 2012-01-24: qty 1

## 2012-01-24 MED ORDER — HYDROCODONE-ACETAMINOPHEN 5-325 MG PO TABS
1.0000 | ORAL_TABLET | Freq: Once | ORAL | Status: AC
  Administered 2012-01-24: 1 via ORAL

## 2012-01-24 MED ORDER — GLYCOPYRROLATE 0.2 MG/ML IJ SOLN
INTRAMUSCULAR | Status: DC | PRN
  Administered 2012-01-24: 0.2 mg via INTRAVENOUS

## 2012-01-24 MED ORDER — HYDROCODONE-ACETAMINOPHEN 5-325 MG PO TABS
ORAL_TABLET | ORAL | Status: AC
  Filled 2012-01-24: qty 1

## 2012-01-24 MED ORDER — PROMETHAZINE HCL 25 MG/ML IJ SOLN: 6.2500 mg | INTRAMUSCULAR | Status: DC | PRN

## 2012-01-24 MED ORDER — KETOROLAC TROMETHAMINE 30 MG/ML IJ SOLN
INTRAMUSCULAR | Status: AC
  Administered 2012-01-24: 30 mg via INTRAVENOUS
  Filled 2012-01-24: qty 1

## 2012-01-24 MED ORDER — MIDAZOLAM HCL 5 MG/5ML IJ SOLN
INTRAMUSCULAR | Status: DC | PRN
  Administered 2012-01-24: 2 mg via INTRAVENOUS

## 2012-01-24 MED ORDER — MIDAZOLAM HCL 2 MG/2ML IJ SOLN: 0.5000 mg | Freq: Once | INTRAMUSCULAR | Status: DC | PRN

## 2012-01-24 SURGICAL SUPPLY — 23 items
ABLATOR ENDOMETRIAL BIPOLAR (ABLATOR) IMPLANT
BLADE INCISOR TRUC PLUS 2.9 (ABLATOR) ×1 IMPLANT
CANISTER SUCTION 2500CC (MISCELLANEOUS) ×2 IMPLANT
CATH ROBINSON RED A/P 16FR (CATHETERS) ×2 IMPLANT
CLOTH BEACON ORANGE TIMEOUT ST (SAFETY) ×2 IMPLANT
CONTAINER PREFILL 10% NBF 60ML (FORM) ×4 IMPLANT
DRESSING TELFA 8X3 (GAUZE/BANDAGES/DRESSINGS) ×2 IMPLANT
ELECT REM PT RETURN 9FT ADLT (ELECTROSURGICAL) ×2
ELECTRODE REM PT RTRN 9FT ADLT (ELECTROSURGICAL) ×1 IMPLANT
GLOVE BIO SURGEON STRL SZ 6.5 (GLOVE) ×2 IMPLANT
GLOVE BIOGEL PI IND STRL 7.0 (GLOVE) ×1 IMPLANT
GLOVE BIOGEL PI INDICATOR 7.0 (GLOVE) ×1
GOWN STRL REIN XL XLG (GOWN DISPOSABLE) ×4 IMPLANT
INCISOR TRUC PLUS BLADE 2.9 (ABLATOR) ×2
KIT HYSTEROSCOPY TRUCLEAR (ABLATOR) ×2 IMPLANT
LOOP ANGLED CUTTING 22FR (CUTTING LOOP) IMPLANT
PACK HYSTEROSCOPY LF (CUSTOM PROCEDURE TRAY) ×2 IMPLANT
PAD OB MATERNITY 4.3X12.25 (PERSONAL CARE ITEMS) ×2 IMPLANT
SUT VICRYL 3 0 RAPIDE (SUTURE) ×2 IMPLANT
SUT VICRYL RAPIDE 3 0 (SUTURE) ×2 IMPLANT
SYR 20CC LL (SYRINGE) ×2 IMPLANT
TOWEL OR 17X24 6PK STRL BLUE (TOWEL DISPOSABLE) ×4 IMPLANT
WATER STERILE IRR 1000ML POUR (IV SOLUTION) ×2 IMPLANT

## 2012-01-24 NOTE — Anesthesia Preprocedure Evaluation (Addendum)
Anesthesia Evaluation  Patient identified by MRN, date of birth, ID band Patient awake    Reviewed: Allergy & Precautions, H&P , Patient's Chart, lab work & pertinent test results, reviewed documented beta blocker date and time   History of Anesthesia Complications Negative for: history of anesthetic complications  Airway Mallampati: I TM Distance: >3 FB Neck ROM: full    Dental No notable dental hx. (+) Teeth Intact   Pulmonary asthma (last inhaler use 3-4 days ago, no hospitalizations) ,  breath sounds clear to auscultation  Pulmonary exam normal       Cardiovascular Exercise Tolerance: Good hypertension, On Home Beta Blockers Rhythm:regular Rate:Normal     Neuro/Psych Diabetic neuropathy - toes/feet negative psych ROS   GI/Hepatic negative GI ROS, Neg liver ROS,   Endo/Other  diabetes (178), Type 2, Insulin DependentMorbid obesityPCOS  Renal/GU negative Renal ROS  Female GU complaint     Musculoskeletal   Abdominal   Peds  Hematology negative hematology ROS (+)   Anesthesia Other Findings Diabetes mellitus     Polycystic ovarian syndrome        Cellulitis of lower leg     Hypertension        Asthma     Irregular menstrual bleeding        PCOS (polycystic ovarian syndrome)     Tachycardia        Abnormal Pap smear 2000   Endometrial ca 12/20/2011    Reproductive/Obstetrics negative OB ROS                          Anesthesia Physical Anesthesia Plan  ASA: III  Anesthesia Plan: General LMA   Post-op Pain Management:    Induction:   Airway Management Planned:   Additional Equipment:   Intra-op Plan:   Post-operative Plan:   Informed Consent: I have reviewed the patients History and Physical, chart, labs and discussed the procedure including the risks, benefits and alternatives for the proposed anesthesia with the patient or authorized representative who has indicated his/her  understanding and acceptance.   Dental Advisory Given  Plan Discussed with: CRNA, Surgeon and Anesthesiologist  Anesthesia Plan Comments:         Anesthesia Quick Evaluation

## 2012-01-24 NOTE — Op Note (Signed)
Pre op DX: Endometrial Cancer vulvar masses and vaginal thickening   Post Op [id number]:OZHY    PHYSICIAN : Novaleigh Kohlman Procedure:  D&C hysteroscopy removal of endometrial polyps, insertion of mirena IUD and vulvar and vaginal bxs   ASSISTANTS: none   ANESTHESIA:   General LMA and paracervical block  ESTIMATED BLOOD LOSS: minimal  LOCAL MEDICATIONS USED:  LIDOCAINE 30CC  SPECIMEN:  Source of Specimen:  endometrial curettings, vulvar and vaginal biopsies  DISPOSITION OF SPECIMEN:  PATHOLOGY  COUNTS Correct:  YES    DICTATION #: The patient was taken to the operating room and prepped and draped in a normal sterile fashion. An in out catheter was used to drain the bladder.   A bivalve speculum was placed into the vagina and anterior lip of the cervix was grasped with a single-tooth tenaculum.  20 cc of 2% lidocaine was used for cervical block.  the cervix was then dilated with Shawnie Pons dilators up to 21. The hysteroscope was placed into the uterine cavity. The  entire uterus and both ostia were visualized.  There was polyploid calcified tissue all over the uterine cavity.  True clear was used to remove the polypoid tissue.   Hyseroscope was then removed from the uterus. A sharp curettage was then done with a curette and endometrial curettings were obtained. The endometrial curettings were sent to pathology. The uterus did sound to 8 cm.  mirena IUD was placed without difficulty.  Lidocaine was placed on the left vaginal wall, both labium majorum and the mons.  The vagina and skin masses were all biopsied with the knife.  All lesions were excised entirely.  Hemostasis was obtained with interruped 3-0 vicryl suture.  there were no polyps or submucosal fibroids or endometrial masses seen. The tenaculum was removed from the cervix and hemostasis was noted.   PLAN OF CARE: discharge to home  PATIENT DISPOSITION:  PACU - hemodynamically stable.

## 2012-01-24 NOTE — H&P (Signed)
  Kelli Allen [id number] y.o. female. Who presents with heavy and irreg vaginal bleeding for over a year. Sometimes heavy and sometimes light .  She has uses 1 pads/ tampons every 2-3 hours while menstruating.  She denies any CP or SOB.  Nothing makes it better.  Nothing makes it worse.  No.dysmenorrhea.  Pt diagnosed with endometrial cancer and declined hysterectomy.  She was seen by [facility] and they recommend polypectomy with mirena placement Pertinent Gynecological History: Contraception: Education given regarding options for contraception, including barrier methods. Blood transfusions: none Sexually transmitted diseases: none Previous GYN Procedures: none Last mammogram:  Last pap: normal Date: WNL 2013 OB History: G0   Menstrual History: Menarche age: 31 No LMP recorded.    Past Medical History  Diagnosis Date  . Diabetes mellitus   . Polycystic ovarian syndrome   . Cellulitis of lower leg   . Hypertension   . Asthma   . Irregular menstrual bleeding   . PCOS (polycystic ovarian syndrome)   . Tachycardia   . Abnormal Pap smear 2000  . Endometrial ca 12/20/2011   Past Surgical History  Procedure Date  . No past surgeries    Current facility-administered medications:lactated ringers infusion, , Intravenous, Continuous, Dana Allan, MD, Last Rate: 125 mL/hr at 01/24/12 1227 No Known Allergies Review of Systems - Negative except as above   Physical Exam  BP 138/91  Pulse 96  Temp 97.9 F (36.6 C) (Oral)  Resp 18  Ht 5\' 5"  (1.651 m)  Wt 290 lb (131.543 kg)  BMI 48.26 kg/m2  SpO2 97% Constitutional: She appears well-developed and well-nourished.  HENT:  Head: Normocephalic.  Eyes: Pupils are equal, round, and reactive to light.  Neck: Normal range of motion. Neck supple.  Cardiovascular: Regular rhythm.   Respiratory: Effort normal and breath sounds normal.  GI: Soft.  Genitourinary:large lesion on the mons.  Skin tag on vagina and thickening of the skin over the  clitoral hood. Normal size uterus and adnexa Musculoskeletal: Normal range of motion.  Neurological: She is alert.  Skin: Skin is warm.  Psychiatric: She has a normal mood and affect.  Results for orders placed during the hospital encounter of 01/24/12 (from the past 72 hour(s))  HCG, QUANTITATIVE, PREGNANCY     Status: Normal   Collection Time   01/24/12 11:45 AM      Component Value Range Comment   hCG, Beta Chain, Quant, S <1  <5 mIU/mL   GLUCOSE, CAPILLARY     Status: Abnormal   Collection Time   01/24/12 12:04 PM      Component Value Range Comment   Glucose-Capillary 178 (*) 70 - 99 mg/dL    [place] [id number]  Length5 Ovaries no abnormal masses  Assessment/Plan: Plan D&C hysteroscopy, polypectomy and vulvar and vaginal bxs.   .  Pt chose surgery.  Plan D&C hysteroscopy polypectomy.  Risks are but not limited to bleeding, infection, scarring of the uterus and perforation.     Zong Mcquarrie A 11/28/2010, 11:41 AM

## 2012-01-24 NOTE — Anesthesia Postprocedure Evaluation (Signed)
Anesthesia Post Note  Patient: Kelli Allen  Procedure(s) Performed: Procedure(s) (LRB): DILATATION AND CURETTAGE /HYSTEROSCOPY (N/A) INTRAUTERINE DEVICE (IUD) INSERTION (N/A)  Anesthesia type: General  Patient location: PACU  Post pain: Pain level controlled  Post assessment: Post-op Vital signs reviewed  Last Vitals:  Filed Vitals:   01/24/12 1530  BP:   Pulse: 89  Temp: 36.5 C  Resp: 20    Post vital signs: Reviewed  Level of consciousness: sedated  Complications: No apparent anesthesia complications

## 2012-01-24 NOTE — Transfer of Care (Signed)
Immediate Anesthesia Transfer of Care Note  Patient: Kelli Allen  Procedure(s) Performed: Procedure(s) (LRB) with comments: DILATATION AND CURETTAGE /HYSTEROSCOPY (N/A) - with vulvar biopsies  INTRAUTERINE DEVICE (IUD) INSERTION (N/A)  Patient Location: PACU  Anesthesia Type:General  Level of Consciousness: awake and alert   Airway & Oxygen Therapy: Patient Spontanous Breathing and Patient connected to nasal cannula oxygen  Post-op Assessment: Report given to PACU RN and Post -op Vital signs reviewed and stable  Post vital signs: Reviewed and stable  Complications: No apparent anesthesia complications

## 2012-01-24 NOTE — Interval H&P Note (Signed)
History and Physical Interval Note:  01/24/2012 1:11 PM  Kelli Allen  has presented today for surgery, with the diagnosis of Endometrial Cancer  The various methods of treatment have been discussed with the patient and family. After consideration of risks, benefits and other options for treatment, the patient has consented to  Procedure(s) (LRB) with comments: DILATATION AND CURETTAGE /HYSTEROSCOPY (N/A) INTRAUTERINE DEVICE (IUD) INSERTION (N/A) as a surgical intervention .  The patient's history has been reviewed, patient examined, no change in status, stable for surgery.  I have reviewed the patient's chart and labs.  Questions were answered to the patient's satisfaction.     [id number] A  Date of Initial H&P: 01/2012  History reviewed, patient examined, no change in status, stable for surgery.

## 2012-01-25 ENCOUNTER — Encounter (HOSPITAL_COMMUNITY): Payer: Self-pay | Admitting: Obstetrics and Gynecology

## 2012-01-26 ENCOUNTER — Telehealth: Payer: Self-pay | Admitting: Obstetrics and Gynecology

## 2012-01-26 MED ORDER — LIDOCAINE 0.5 % EX GEL: 0.5000 "application " | CUTANEOUS | Status: DC | PRN

## 2012-01-26 NOTE — Telephone Encounter (Signed)
Tc to pt per telephone call. Pt states,"feels a stich that is causing burning and irritation inside the vagina". No fever. Consulted with ND, pt may have Lidocaine jelly. Pt agrees.

## 2012-01-26 NOTE — Telephone Encounter (Signed)
Pt is having pain during urination and wants to know what she get to relief. Pt had surgery 1/15.

## 2012-01-31 ENCOUNTER — Telehealth: Payer: Self-pay | Admitting: Obstetrics and Gynecology

## 2012-01-31 NOTE — Telephone Encounter (Signed)
Pt called, states had procedure w/ ND on 01/24/12, since then has been having some reoccuring yeast infections and had a Diflucan and went ahead and took it, has not had any problems from taking it but wanted to make a note that it was taken.  The other concern the pt had was that she looked into her My Chart and saw that her WBC count was @ 14 when the normal range is 4-10.5.  Pt states she has a hx of cellulitis on her leg and has been hospitalized in the past for it when her WBC count was @ 16.  Pt wanted to be proactive and if she needs to be put on an antibx or see her PCP about it she would like to do that to avoid hospitalization again.  Pt advised will make ND aware of concerns, will call back with any response.  Pt has a f/u appt w/ ND on Tuesday 02/06/12.

## 2012-02-01 NOTE — Telephone Encounter (Signed)
Tc to pt regarding ND comments below.  Gave pt path results, pt states she will discuss any further questions w/ ND @ her appt on Tuesday 02/06/12.

## 2012-02-01 NOTE — Telephone Encounter (Signed)
Tell pt her WBC may be elevated and nothing is wrong. If she has hany signs of infection (temperature elevation, or redness anywhere please let me know) Please give her the path results.  Thank you

## 2012-02-05 ENCOUNTER — Encounter: Payer: BC Managed Care – PPO | Admitting: Obstetrics and Gynecology

## 2012-02-06 ENCOUNTER — Ambulatory Visit: Payer: BC Managed Care – PPO | Admitting: Obstetrics and Gynecology

## 2012-02-06 ENCOUNTER — Encounter: Payer: Self-pay | Admitting: Obstetrics and Gynecology

## 2012-02-06 VITALS — BP 128/86 | Temp 97.9°F | Wt 291.0 lb

## 2012-02-06 DIAGNOSIS — C541 Malignant neoplasm of endometrium: Secondary | ICD-10-CM

## 2012-02-06 NOTE — Progress Notes (Signed)
DATE OF SURGERY: 01/24/12  TYPE OF SURGERY: Hysteroscopy D&C, mirena insertion  PAIN:Yes mild cramping VAG BLEEDING: yes spotting started today  VAG DISCHARGE: yes NORMAL GI FUNCTN: yes NORMAL GU FUNCTN: yes  Diagnosis 1. Endometrium, curettage, with polyp - ENDOMETRIOID CARCINOMA, FIGO GRADE I, ARISING IN A BACKGROUND OF ATYPICAL COMPLEX HYPERPLASIA. 2. Vagina, biopsy, left wall - FIBROEPITHELIAL POLYP. 3. Perineum, biopsy, left - BENIGN MELANOCYTIC NEVUS, COMPOUND TYPE. 4. Labium, biopsy, right majora - BENIGN MELANOCYTIC NEVUS, COMPOUND TYPE. 5. Vulva, biopsy, mons mass - GENITAL MELANOCYTIC NEVUS, COMPOUND TYPE. - BILATERAL MARGINS INVOLVED. BP 128/86  Temp 97.9 F (36.6 C) (Oral)  Wt 291 lb (131.997 kg)  LMP 01/17/2012 Physical Examination: General appearance - alert, well appearing, and in no distress Abdomen - soft, nontender, nondistended, no masses or organomegaly Pelvic - normal external genitalia, vulva, vagina, cervix, uterus and adnexa.  String is visualized Pt healing well Pathology reviewed Plan EMBX in three months Pt may consider hysterectomy

## 2012-05-08 ENCOUNTER — Ambulatory Visit: Payer: BC Managed Care – PPO | Attending: Gynecologic Oncology | Admitting: Gynecologic Oncology

## 2012-05-08 ENCOUNTER — Encounter: Payer: Self-pay | Admitting: Gynecologic Oncology

## 2012-05-08 VITALS — BP 170/88 | HR 80 | Temp 98.5°F | Resp 22 | Ht 65.0 in | Wt 292.0 lb

## 2012-05-08 DIAGNOSIS — E282 Polycystic ovarian syndrome: Secondary | ICD-10-CM | POA: Insufficient documentation

## 2012-05-08 DIAGNOSIS — Z975 Presence of (intrauterine) contraceptive device: Secondary | ICD-10-CM | POA: Insufficient documentation

## 2012-05-08 DIAGNOSIS — C549 Malignant neoplasm of corpus uteri, unspecified: Secondary | ICD-10-CM | POA: Insufficient documentation

## 2012-05-08 DIAGNOSIS — C541 Malignant neoplasm of endometrium: Secondary | ICD-10-CM

## 2012-05-08 DIAGNOSIS — I10 Essential (primary) hypertension: Secondary | ICD-10-CM | POA: Insufficient documentation

## 2012-05-08 DIAGNOSIS — E119 Type 2 diabetes mellitus without complications: Secondary | ICD-10-CM | POA: Insufficient documentation

## 2012-05-08 NOTE — Patient Instructions (Signed)
Endometrial Biopsy This is a test in which a tissue sample (a biopsy) is taken from inside the uterus (womb). It is then looked at by a specialist under a microscope to see if the tissue is normal or abnormal. The endometrium is the lining of the uterus. This test helps determine where you are in your menstrual cycle and how hormone levels are affecting the lining of the uterus. Another use for this test is to diagnose endometrial cancer, tuberculosis, polyps, or inflammatory conditions and to evaluate uterine bleeding. PREPARATION FOR TEST No preparation or fasting is necessary. NORMAL FINDINGS No pathologic conditions. Presence of "secretory-type" endometrium 3 to 5 days before to normal menstruation. Ranges for normal findings may vary among different laboratories and hospitals. You should always check with your doctor after having lab work or other tests done to discuss the meaning of your test results and whether your values are considered within normal limits. MEANING OF TEST  Your caregiver will go over the test results with you and discuss the importance and meaning of your results, as well as treatment options and the need for additional tests if necessary. OBTAINING THE TEST RESULTS It is your responsibility to obtain your test results. Ask the lab or department performing the test when and how you will get your results. Document Released: 04/28/2004 Document Revised: 03/20/2011 Document Reviewed: 12/06/2007 ExitCare Patient Information 2013 [employer].  

## 2012-05-08 NOTE — Progress Notes (Signed)
Consult Note: Gyn-Onc  Kelli Allen 37 y.o. female  CC:  Chief Complaint  Patient presents with  . endometrial cancer    HPI:  Patient is a 37 year old gravida 0 para 0 who remembers having irregular periods in which he passed some tissue in October of 2012. She was seen by Dr. Normand Sloop and apparently Dr. Normand Sloop wanted to perform a history of sonogram but the patient declined. She was seen in followup with Dr. Normand Sloop in September was started on birth control pills. In October or November she had some spotting and it was fairly regular for a few days. She ultimately underwent a hysteroscopy D&C that Dr. Normand Sloop and 1 he to perform and underwent an endometrial biopsy on November 7. [name] 69-year-old a polyp. The uterus measures 8.04 x 4.3 cm with normal appearing adnexa. There was a 1.2 cm polyp noted. She had an endometrial biopsy that revealed a grade 1 endometrial carcinoma arising atypical complex hyperplasia. The patient wishes to retain her fertility and subsequent comes in today still see [place].  She underwent a D&C IUD placement with Dr. Normand Sloop on January 15. Pathology revealed an endometrioid adenocarcinoma grade 1 arising in a background of atypical complex hyperplasia. She also had some benign melanotic nevi removed at that time.  She got a second opinion with Dr. Huntley Dec who told her that the most definitive treatment would be hysterectomy. She comes in today for followup. She continues to struggle with the decision to undergo a hysterectomy. She comes accompanied by 2 friends today. She has brought down her A1c to 7.  We spent about 10 minutes discussing hysterectomy as the most definitive treatment for her condition. Also discussed with her that she currently has an IUD in place. We could do a biopsy today as the IUD was placed approximately 3 months to see if there's been any progression of her disease. If it appears that the IUD is helping her with her endometrial  cancer she could continue this for up to a year and if there's no evidence of hyperplasia or cancer at that time she could then seek assistance of infertility specialist. Alternatively if the biopsy today again shows endometrial cancer that might help her be able to come to grips better with the decision of having a hysterectomy as she will know that she has pursued all of the conservative treatment options without success. After our discussion she wishes to proceed with an endometrial biopsy today.   Review of Systems:   Current Meds:  Outpatient Encounter Prescriptions as of 05/08/2012  Medication Sig Dispense Refill  . Albuterol Sulfate (PROAIR HFA IN) Inhale into the lungs.      Marland Kitchen atorvastatin (LIPITOR) 10 MG tablet Take 10 mg by mouth daily.      . Canagliflozin (INVOKANA) 300 MG TABS Take by mouth every morning.       . gabapentin (NEURONTIN) 100 MG capsule Take 100 mg by mouth 3 (three) times daily.      . insulin glargine (LANTUS) 100 UNIT/ML injection Inject 80 Units into the skin at bedtime.      Marland Kitchen labetalol (NORMODYNE) 200 MG tablet Take 200 mg by mouth 2 (two) times daily.      . Liraglutide (VICTOZA) 18 MG/3ML SOLN Inject 1.8 mg into the skin daily.       Marland Kitchen lisinopril-hydrochlorothiazide (PRINZIDE,ZESTORETIC) 20-12.5 MG per tablet Take 1 tablet by mouth at bedtime.       . metformin (FORTAMET) 500 MG (OSM) 24  hr tablet Take 1,000 mg by mouth 2 (two) times daily with a meal.       . clobetasol cream (TEMOVATE) 0.05 % Apply topically 2 (two) times daily.  30 g  0  . HYDROcodone-acetaminophen (VICODIN) 5-500 MG per tablet Take 1 tablet by mouth every 6 (six) hours as needed for pain.  30 tablet  0  . ibuprofen (ADVIL,MOTRIN) 600 MG tablet Take 1 tablet (600 mg total) by mouth every 6 (six) hours as needed for pain.  30 tablet  0  . insulin aspart (NOVOLOG) 100 UNIT/ML injection Inject 20 Units into the skin 3 (three) times daily with meals.      . Lidocaine 0.5 % GEL Apply 0.5  application topically as needed.  1 Tube  0  . omega-3 acid ethyl esters (LOVAZA) 1 G capsule Take 1 g by mouth 2 (two) times daily. Nutralite Brand.      Marland Kitchen OVER THE COUNTER MEDICATION Take 1 packet by mouth daily. Nutralite Vitamins      . OVER THE COUNTER MEDICATION Take 3 tablets by mouth daily. Tri-Iron  Folic Acid Vit C 30mg  Fe 10mg       . OVER THE COUNTER MEDICATION Take 6 tablets by mouth daily. Slimmetry Diet supplement Green Tea 540mg /2tabs Yerbamate Extract 150mg /day Charletta Cousin Tree extract 160mg  Cloleus Foskelii Ext 50mg       . OVER THE COUNTER MEDICATION Take 1 tablet by mouth daily. Glucose Health Cromium Piccolate       No facility-administered encounter medications on file as of 05/08/2012.    Allergy: No Known Allergies  Social Hx:  She denies tobacco. She drinks alcohol socially she has a drug use. Her husband is with an Facilities manager but she is married. She works in Clinical biochemist for [employer].  History   Social History  . Marital Status: Married    Spouse Name: N/A    Number of Children: N/A  . Years of Education: N/A   Occupational History  . Not on file.   Social History Main Topics  . Smoking status: Never Smoker   . Smokeless tobacco: Not on file  . Alcohol Use: Yes     Comment: social  . Drug Use: No  . Sexually Active: Yes    Birth Control/ Protection: IUD     Comment: mirena    Other Topics Concern  . Not on file   Social History Narrative  . No narrative on file    Past Surgical Hx:  Past Surgical History  Procedure Laterality Date  . No past surgeries    . Hysteroscopy w/d&c  01/24/2012    Procedure: DILATATION AND CURETTAGE /HYSTEROSCOPY;  Surgeon: Michael Litter, MD;  Location: [facility];  Service: Gynecology;  Laterality: N/A;  with vulvar biopsies   . Intrauterine device (iud) insertion  01/24/2012    Procedure: INTRAUTERINE DEVICE (IUD) INSERTION;  Surgeon: Michael Litter, MD;  Location: [facility];  Service:  Gynecology;  Laterality: N/A;    Past Medical Hx:  Past Medical History  Diagnosis Date  . Diabetes mellitus   . Polycystic ovarian syndrome   . Cellulitis of lower leg   . Hypertension   . Asthma   . Irregular menstrual bleeding   . PCOS (polycystic ovarian syndrome)   . Tachycardia   . Abnormal Pap smear 2000  . Endometrial ca 12/20/2011    Family Hx: She is one of 7 girls. Although 2 of her sisters have depression. She has one sister  with mitochondrial myopathy. Her paternal grandfather died of lung cancer she was a smoker. Maternal grandmother may have had bone "cancer" Family History  Problem Relation Age of Onset  . Diabetes type II Mother   . Coronary artery disease Mother   . Diabetes type II Father   . Coronary artery disease Father   . Heart disease Sister     Vitals:  Blood pressure 170/88, pulse 80, temperature 98.5 F (36.9 C), temperature source Oral, resp. rate 22, height 5\' 5"  (1.651 m), weight 292 lb (132.45 kg).  Physical Exam:  Well-nourished well-developed female in no acute distress. BMI 38.5   Abdomen: Morbidly obese. Soft, nondistended, but no palpable masses or hepatosplenomegaly but exam is limited by habitus.  Extremities: 1+ nonpitting edema equal bilaterally.  Pelvic: Normal external female genitalia. Vagina is well epithelialized. The cervix is nulliparous. There's no gross visible lesions. IUD string is visualized. After obtaining her verbal consent the anterior lip of the cervix was grasped with a single-tooth tenaculum. Endometrial biopsy x2 was performed without difficulty. The uterus sounded to 7-1/2 cm. She tolerated it well. The tenaculum was removed and there is no active bleeding from the tenaculum sites. Bimanual examination is limited by her habitus. The uterus is not  Enlarged, however I cannot distinctly palpate the uterus. There are no adnexal masses.  Assessment/Plan: 37 year old gravida 0 with a grade 1 endometrial carcinoma as  well as an endometrial polyp on sonohysterogram. The patient wishes to maintain fertility. I discussed with her the standard of care for the treatment of endometrial cancer would be a hysterectomy.   She had an MRI of the abdomen and pelvis. There was no evidence of deep myometrial invasion on MRI and no lymphadenopathy was noted.  She then underwent a D&C with IUD placement. We'll contact her with the results of the biopsy next week and discuss the results with her so that she can make a treatment decision.    Candelario Steppe A., MD 05/08/2012, 1:42 PM

## 2012-05-10 ENCOUNTER — Telehealth: Payer: Self-pay | Admitting: *Deleted

## 2012-05-10 NOTE — Telephone Encounter (Signed)
Left msg with bx results and recapped Dr Denman George recommendations. While writing this pt called back and information given personally.  She will consider her options and call us with decision next week.

## 2012-05-17 ENCOUNTER — Telehealth: Payer: Self-pay | Admitting: Gynecologic Oncology

## 2012-05-17 NOTE — Telephone Encounter (Signed)
Returned patient's call and LM. PG

## 2012-05-17 NOTE — Telephone Encounter (Signed)
TC to patient. She understands that there is hyperplasia with a focus suspicious for cancer. She may or may not want definitive surgery. She understands that she needs to loose weight. She will reconsider surgery surgery and call us back. PG

## 2012-06-04 ENCOUNTER — Ambulatory Visit: Payer: BC Managed Care – PPO | Admitting: Gynecologic Oncology

## 2012-06-20 ENCOUNTER — Encounter: Payer: Self-pay | Admitting: Gynecologic Oncology

## 2012-06-20 ENCOUNTER — Ambulatory Visit: Payer: BC Managed Care – PPO | Attending: Gynecologic Oncology | Admitting: Gynecologic Oncology

## 2012-06-20 VITALS — BP 120/80 | HR 70 | Temp 97.9°F | Resp 18 | Ht 65.0 in | Wt 289.2 lb

## 2012-06-20 DIAGNOSIS — Z79899 Other long term (current) drug therapy: Secondary | ICD-10-CM | POA: Insufficient documentation

## 2012-06-20 DIAGNOSIS — C549 Malignant neoplasm of corpus uteri, unspecified: Secondary | ICD-10-CM | POA: Insufficient documentation

## 2012-06-20 DIAGNOSIS — E119 Type 2 diabetes mellitus without complications: Secondary | ICD-10-CM | POA: Insufficient documentation

## 2012-06-20 DIAGNOSIS — E282 Polycystic ovarian syndrome: Secondary | ICD-10-CM | POA: Insufficient documentation

## 2012-06-20 DIAGNOSIS — C541 Malignant neoplasm of endometrium: Secondary | ICD-10-CM

## 2012-06-20 DIAGNOSIS — Z6838 Body mass index (BMI) 38.0-38.9, adult: Secondary | ICD-10-CM | POA: Insufficient documentation

## 2012-06-20 DIAGNOSIS — Z794 Long term (current) use of insulin: Secondary | ICD-10-CM | POA: Insufficient documentation

## 2012-06-20 DIAGNOSIS — Z975 Presence of (intrauterine) contraceptive device: Secondary | ICD-10-CM | POA: Insufficient documentation

## 2012-06-20 DIAGNOSIS — I10 Essential (primary) hypertension: Secondary | ICD-10-CM | POA: Insufficient documentation

## 2012-06-20 NOTE — Progress Notes (Signed)
Consult Note: Gyn-Onc  Kelli Allen 37 y.o. female  CC:  Chief Complaint  Patient presents with  . Endometrial Cancer    Follow up    HPI:  Patient is a 37 year old gravida 0 para 0 who remembers having irregular periods in which he passed some tissue in October of 2012. She was seen by Dr. Normand Sloop and apparently Dr. Normand Sloop wanted to perform a history of sonogram but the patient declined. She was seen in followup with Dr. Normand Sloop in September was started on birth control pills. In October or November she had some spotting and it was fairly regular for a few days. She ultimately underwent a hysteroscopy D&C that Dr. Normand Sloop and 1 he to perform and underwent an endometrial biopsy on November 7. [name] 25-year-old a polyp. The uterus measures 8.04 x 4.3 cm with normal appearing adnexa. There was a 1.2 cm polyp noted. She had an endometrial biopsy that revealed a grade 1 endometrial carcinoma arising atypical complex hyperplasia. The patient wishes to retain her fertility and subsequent comes in today still see [place].  She underwent a D&C IUD placement with Dr. Normand Sloop on January 15. Pathology revealed an endometrioid adenocarcinoma grade 1 arising in a background of atypical complex hyperplasia. She also had some benign melanotic nevi removed at that time.  She got a second opinion with Dr. Huntley Dec who told her that the most definitive treatment would be hysterectomy. She comes in today for followup. She continues to struggle with the decision to undergo a hysterectomy. She comes accompanied by 2 friends today. She has brought down her A1c to 7.  We spent about 10 minutes discussing hysterectomy as the most definitive treatment for her condition. Also discussed with her that she currently has an IUD in place. We could do a biopsy today as the IUD was placed approximately 3 months to see if there's been any progression of her disease. If it appears that the IUD is helping her with her  endometrial cancer she could continue this for up to a year and if there's no evidence of hyperplasia or cancer at that time she could then seek assistance of infertility specialist. Alternatively if the biopsy today again shows endometrial cancer that might help her be able to come to grips better with the decision of having a hysterectomy as she will know that she has pursued all of the conservative treatment options without success. After our discussion she wishes to proceed with an endometrial biopsy today.   Review of Systems: I performed an endometrial biopsy on her in April that revealed persistent adenocarcinoma. She decided she wanted to proceed with hysterectomy. She and I spoke in May and she was insistent on a hysterectomy. We discussed close regarding control of her sugars as well as weight loss in anticipation of surgery. She stated that she felt that she could lose 20 pounds in a month. She comes in today for followup she has only lost 3 pounds since I last saw her. She states that her diet was off 1500 calories and she has seen her endocrinologist was dropped to 1200 calories. He recommended that she decrease eating processed foods and increasing her exercise. She's walking but amount half per day. Her last A1c was 7.1. Her sugars are 70-115. Per her endocrinologist if her A1c was less than 7.5 it would be okay for her to move forward with surgery.  Current Meds:  Outpatient Encounter Prescriptions as of 06/20/2012  Medication Sig Dispense Refill  .  Albuterol Sulfate (PROAIR HFA IN) Inhale into the lungs.      Marland Kitchen atorvastatin (LIPITOR) 10 MG tablet Take 10 mg by mouth daily.      . Canagliflozin (INVOKANA) 300 MG TABS Take by mouth every morning.       . clobetasol cream (TEMOVATE) 0.05 % Apply topically 2 (two) times daily.  30 g  0  . gabapentin (NEURONTIN) 100 MG capsule Take 100 mg by mouth 3 (three) times daily.      Marland Kitchen HYDROcodone-acetaminophen (VICODIN) 5-500 MG per tablet Take 1  tablet by mouth every 6 (six) hours as needed for pain.  30 tablet  0  . ibuprofen (ADVIL,MOTRIN) 600 MG tablet Take 1 tablet (600 mg total) by mouth every 6 (six) hours as needed for pain.  30 tablet  0  . insulin aspart (NOVOLOG) 100 UNIT/ML injection Inject 20 Units into the skin 3 (three) times daily with meals.      . insulin glargine (LANTUS) 100 UNIT/ML injection Inject 80 Units into the skin at bedtime.      Marland Kitchen labetalol (NORMODYNE) 200 MG tablet Take 200 mg by mouth 2 (two) times daily.      . Lidocaine 0.5 % GEL Apply 0.5 application topically as needed.  1 Tube  0  . Liraglutide (VICTOZA) 18 MG/3ML SOLN Inject 1.8 mg into the skin daily.       Marland Kitchen lisinopril-hydrochlorothiazide (PRINZIDE,ZESTORETIC) 20-12.5 MG per tablet Take 1 tablet by mouth at bedtime.       . metformin (FORTAMET) 500 MG (OSM) 24 hr tablet Take 1,000 mg by mouth 2 (two) times daily with a meal.       . omega-3 acid ethyl esters (LOVAZA) 1 G capsule Take 1 g by mouth 2 (two) times daily. Nutralite Brand.      Marland Kitchen OVER THE COUNTER MEDICATION Take 1 packet by mouth daily. Nutralite Vitamins      . OVER THE COUNTER MEDICATION Take 3 tablets by mouth daily. Tri-Iron  Folic Acid Vit C 30mg  Fe 10mg       . OVER THE COUNTER MEDICATION Take 6 tablets by mouth daily. Slimmetry Diet supplement Green Tea 540mg /2tabs Yerbamate Extract 150mg /day Charletta Cousin Tree extract 160mg  Cloleus Foskelii Ext 50mg       . OVER THE COUNTER MEDICATION Take 1 tablet by mouth daily. Glucose Health Cromium Piccolate       No facility-administered encounter medications on file as of 06/20/2012.    Allergy: No Known Allergies  Social Hx:  She denies tobacco. She drinks alcohol socially she has a drug use. Her husband is with an Facilities manager but she is married. She works in Clinical biochemist for [employer].  History   Social History  . Marital Status: Married    Spouse Name: N/A    Number of Children: N/A  . Years of Education: N/A    Occupational History  . Not on file.   Social History Main Topics  . Smoking status: Never Smoker   . Smokeless tobacco: Not on file  . Alcohol Use: Yes     Comment: social  . Drug Use: No  . Sexually Active: Yes    Birth Control/ Protection: IUD     Comment: mirena    Other Topics Concern  . Not on file   Social History Narrative  . No narrative on file    Past Surgical Hx:  Past Surgical History  Procedure Laterality Date  . No past surgeries    .  Hysteroscopy w/d&c  01/24/2012    Procedure: DILATATION AND CURETTAGE /HYSTEROSCOPY;  Surgeon: Michael Litter, MD;  Location: [facility];  Service: Gynecology;  Laterality: N/A;  with vulvar biopsies   . Intrauterine device (iud) insertion  01/24/2012    Procedure: INTRAUTERINE DEVICE (IUD) INSERTION;  Surgeon: Michael Litter, MD;  Location: [facility];  Service: Gynecology;  Laterality: N/A;    Past Medical Hx:  Past Medical History  Diagnosis Date  . Diabetes mellitus   . Polycystic ovarian syndrome   . Cellulitis of lower leg   . Hypertension   . Asthma   . Irregular menstrual bleeding   . PCOS (polycystic ovarian syndrome)   . Tachycardia   . Abnormal Pap smear 2000  . Endometrial ca 12/20/2011    Family Hx: She is one of 7 girls. Although 2 of her sisters have depression. She has one sister with mitochondrial myopathy. Her paternal grandfather died of lung cancer she was a smoker. Maternal grandmother may have had bone "cancer" Family History  Problem Relation Age of Onset  . Diabetes type II Mother   . Coronary artery disease Mother   . Diabetes type II Father   . Coronary artery disease Father   . Heart disease Sister     Vitals:  Blood pressure 120/80, pulse 70, temperature 97.9 F (36.6 C), temperature source Oral, resp. rate 18, height 5\' 5"  (1.651 m), weight 289 lb 3.2 oz (131.18 kg).  Physical Exam:  Well-nourished well-developed female in no acute distress. BMI 38.5   Assessment/Plan: 37 year old  gravida 0 with a grade 1 endometrial carcinoma as well as an endometrial polyp on sonohysterogram. The patient wishes to maintain fertility. I discussed with her the standard of care for the treatment of endometrial cancer would be a hysterectomy.   She had an MRI of the abdomen and pelvis. There was no evidence of deep myometrial invasion on MRI and no lymphadenopathy was noted.  She then underwent a D&C with IUD placement. April biopsy revealed:  Diagnosis Endometrium, biopsy - COMPLEX ATYPICAL HYPERPLASIA WITH MICROSCOPIC FOCI SUSPICIOUS FOR RESIDUAL WELLDIFFERENTIATED ADENOCARCINOMA.  She was just proceed with definitive treatment. She certainly scheduled for surgery on June 24. That'll involve a robotic hysterectomy BSO and possible lymph nodes. She understands risk for laparotomy based on her morbid obesity.    Cleda Mccreedy A., MD 06/20/2012, 4:42 PM

## 2012-06-21 ENCOUNTER — Encounter (HOSPITAL_COMMUNITY): Payer: Self-pay | Admitting: Pharmacy Technician

## 2012-06-27 NOTE — Patient Instructions (Addendum)
20 Kelli Allen  06/27/2012   Your procedure is scheduled on: 07/02/12  Report to [facility] at 0515 AM.  Call this number if you have problems the morning of surgery [phone]: [phone]   Remember: clear liquids for 24 hours pre-op   Do not eat food or drink liquids After Midnight.     Take these medicines the morning of surgery with A SIP OF WATER: lipitor, labetalol   Do not wear jewelry, make-up or nail polish.  Do not wear lotions, powders, or perfumes. You may wear deodorant.  Do not shave 48 hours prior to surgery. Men may shave face and neck.  Do not bring valuables to the hospital.  Contacts, dentures or bridgework may not be worn into surgery.  Leave suitcase in the car. After surgery it may be brought to your room.  For patients admitted to the hospital, checkout time is 11:00 AM the day of discharge.    Please read over the following fact sheets that you were given: MRSA Information, incentive spirometry fact sheet, blood fact sheet, clear liquids fact sheet  Birdie Sons, RN  pre op nurse call if needed [phone]    FAILURE TO FOLLOW THESE INSTRUCTIONS MAY RESULT IN CANCELLATION OF YOUR SURGERY   Patient Signature: ___________________________________________

## 2012-06-28 ENCOUNTER — Encounter (HOSPITAL_COMMUNITY): Payer: Self-pay

## 2012-06-28 ENCOUNTER — Encounter: Payer: Self-pay | Admitting: Gynecologic Oncology

## 2012-06-28 ENCOUNTER — Encounter (HOSPITAL_COMMUNITY)
Admission: RE | Admit: 2012-06-28 | Discharge: 2012-06-28 | Disposition: A | Discharge status: Home / Self Care | Payer: BC Managed Care – PPO | Source: Ambulatory Visit | Attending: Gynecologic Oncology | Admitting: Gynecologic Oncology

## 2012-06-28 ENCOUNTER — Ambulatory Visit (HOSPITAL_COMMUNITY)
Admission: RE | Admit: 2012-06-28 | Discharge: 2012-06-28 | Disposition: A | Discharge status: Home / Self Care | Payer: BC Managed Care – PPO | Source: Ambulatory Visit | Attending: Gynecologic Oncology | Admitting: Gynecologic Oncology

## 2012-06-28 DIAGNOSIS — Z01810 Encounter for preprocedural cardiovascular examination: Secondary | ICD-10-CM | POA: Insufficient documentation

## 2012-06-28 DIAGNOSIS — C549 Malignant neoplasm of corpus uteri, unspecified: Secondary | ICD-10-CM | POA: Insufficient documentation

## 2012-06-28 DIAGNOSIS — Z01818 Encounter for other preprocedural examination: Secondary | ICD-10-CM | POA: Insufficient documentation

## 2012-06-28 HISTORY — DX: Family history of other specified conditions: Z84.89

## 2012-06-28 LAB — COMPREHENSIVE METABOLIC PANEL
AST: 18 U/L (ref 0–37)
Albumin: 4.2 g/dL (ref 3.5–5.2)
Alkaline Phosphatase: 45 U/L (ref 39–117)
Chloride: 100 mEq/L (ref 96–112)
Potassium: 4.3 mEq/L (ref 3.5–5.1)
Sodium: 140 mEq/L (ref 135–145)
Total Bilirubin: 0.3 mg/dL (ref 0.3–1.2)
Total Protein: 7.3 g/dL (ref 6.0–8.3)

## 2012-06-28 LAB — CBC WITH DIFFERENTIAL/PLATELET
Basophils Absolute: 0.1 10*3/uL (ref 0.0–0.1)
Basophils Relative: 1 % (ref 0–1)
Hemoglobin: 15.3 g/dL — ABNORMAL HIGH (ref 12.0–15.0)
MCHC: 33.5 g/dL (ref 30.0–36.0)
Neutro Abs: 8 10*3/uL — ABNORMAL HIGH (ref 1.7–7.7)
Neutrophils Relative %: 70 % (ref 43–77)
Platelets: 312 10*3/uL (ref 150–400)
RDW: 13.6 % (ref 11.5–15.5)

## 2012-06-28 LAB — ABO/RH: ABO/RH(D): A POS

## 2012-06-28 NOTE — Progress Notes (Signed)
06/28/12 0835  OBSTRUCTIVE SLEEP APNEA  Have you ever been diagnosed with sleep apnea through a sleep study? No  Do you snore loudly (loud enough to be heard through closed doors)?  1  Do you often feel tired, fatigued, or sleepy during the daytime? 1  Has anyone observed you stop breathing during your sleep? 1  Do you have, or are you being treated for high blood pressure? 1  BMI more than 35 kg/m2? 1  Age over 39 years old? 0  Neck circumference greater than 40 cm/18 inches? 1  Gender: 0  Obstructive Sleep Apnea Score 6  Score 4 or greater  Results sent to PCP

## 2012-07-01 ENCOUNTER — Telehealth: Payer: Self-pay | Admitting: Gynecologic Oncology

## 2012-07-01 NOTE — Telephone Encounter (Signed)
Telephone call to check on pre-operative status.  Patient complaint with pre-operative instructions.  Reinforced NPO after midnight.  No questions or concerns voiced.  Instructed to call for any needs. 

## 2012-07-02 ENCOUNTER — Encounter (HOSPITAL_COMMUNITY)
Admission: RE | Disposition: A | Discharge status: Home / Self Care | Payer: Self-pay | Source: Ambulatory Visit | Attending: Obstetrics & Gynecology

## 2012-07-02 ENCOUNTER — Encounter (HOSPITAL_COMMUNITY): Payer: Self-pay | Admitting: *Deleted

## 2012-07-02 ENCOUNTER — Ambulatory Visit (HOSPITAL_COMMUNITY)
Admission: RE | Admit: 2012-07-02 | Discharge: 2012-07-03 | Disposition: A | Discharge status: Home / Self Care | Payer: BC Managed Care – PPO | Source: Ambulatory Visit | Attending: Obstetrics & Gynecology | Admitting: Obstetrics & Gynecology

## 2012-07-02 ENCOUNTER — Encounter (HOSPITAL_COMMUNITY): Payer: Self-pay | Admitting: Certified Registered Nurse Anesthetist

## 2012-07-02 ENCOUNTER — Ambulatory Visit (HOSPITAL_COMMUNITY): Payer: BC Managed Care – PPO | Admitting: Certified Registered Nurse Anesthetist

## 2012-07-02 DIAGNOSIS — Z794 Long term (current) use of insulin: Secondary | ICD-10-CM | POA: Insufficient documentation

## 2012-07-02 DIAGNOSIS — Z79899 Other long term (current) drug therapy: Secondary | ICD-10-CM | POA: Insufficient documentation

## 2012-07-02 DIAGNOSIS — C541 Malignant neoplasm of endometrium: Secondary | ICD-10-CM

## 2012-07-02 DIAGNOSIS — Z8542 Personal history of malignant neoplasm of other parts of uterus: Secondary | ICD-10-CM | POA: Diagnosis present

## 2012-07-02 DIAGNOSIS — D259 Leiomyoma of uterus, unspecified: Secondary | ICD-10-CM | POA: Insufficient documentation

## 2012-07-02 DIAGNOSIS — C549 Malignant neoplasm of corpus uteri, unspecified: Secondary | ICD-10-CM | POA: Insufficient documentation

## 2012-07-02 DIAGNOSIS — E119 Type 2 diabetes mellitus without complications: Secondary | ICD-10-CM | POA: Insufficient documentation

## 2012-07-02 DIAGNOSIS — I10 Essential (primary) hypertension: Secondary | ICD-10-CM | POA: Insufficient documentation

## 2012-07-02 DIAGNOSIS — J45909 Unspecified asthma, uncomplicated: Secondary | ICD-10-CM | POA: Insufficient documentation

## 2012-07-02 HISTORY — PX: ROBOTIC ASSISTED TOTAL HYSTERECTOMY WITH BILATERAL SALPINGO OOPHERECTOMY: SHX6086

## 2012-07-02 LAB — GLUCOSE, CAPILLARY
Glucose-Capillary: 145 mg/dL — ABNORMAL HIGH (ref 70–99)
Glucose-Capillary: 117 mg/dL — ABNORMAL HIGH (ref 70–99)

## 2012-07-02 LAB — TYPE AND SCREEN: ABO/RH(D): A POS

## 2012-07-02 SURGERY — ROBOTIC ASSISTED TOTAL HYSTERECTOMY WITH BILATERAL SALPINGO OOPHORECTOMY
Anesthesia: General | Laterality: Bilateral | Wound class: Clean Contaminated

## 2012-07-02 MED ORDER — HYDROMORPHONE HCL PF 1 MG/ML IJ SOLN
0.5000 mg | INTRAMUSCULAR | Status: DC | PRN
  Administered 2012-07-02 (×2): 0.5 mg via INTRAVENOUS

## 2012-07-02 MED ORDER — MEPERIDINE HCL 50 MG/ML IJ SOLN: 6.2500 mg | INTRAMUSCULAR | Status: DC | PRN

## 2012-07-02 MED ORDER — ZOLPIDEM TARTRATE 5 MG PO TABS: 5.0000 mg | ORAL_TABLET | Freq: Every evening | ORAL | Status: DC | PRN

## 2012-07-02 MED ORDER — PROMETHAZINE HCL 25 MG/ML IJ SOLN: 6.2500 mg | INTRAMUSCULAR | Status: DC | PRN

## 2012-07-02 MED ORDER — STERILE WATER FOR IRRIGATION IR SOLN
Status: DC | PRN
  Administered 2012-07-02: 3000 mL

## 2012-07-02 MED ORDER — GABAPENTIN 300 MG PO CAPS
300.0000 mg | ORAL_CAPSULE | Freq: Every day | ORAL | Status: DC
  Administered 2012-07-02: 300 mg via ORAL
  Filled 2012-07-02 (×2): qty 1

## 2012-07-02 MED ORDER — GLYCOPYRROLATE 0.2 MG/ML IJ SOLN
INTRAMUSCULAR | Status: DC | PRN
  Administered 2012-07-02: .8 mg via INTRAVENOUS

## 2012-07-02 MED ORDER — KCL IN DEXTROSE-NACL 20-5-0.45 MEQ/L-%-% IV SOLN
INTRAVENOUS | Status: DC
  Administered 2012-07-02 – 2012-07-03 (×2): via INTRAVENOUS
  Filled 2012-07-02 (×4): qty 1000

## 2012-07-02 MED ORDER — OXYCODONE HCL 5 MG/5ML PO SOLN: 5.0000 mg | Freq: Once | ORAL | Status: DC | PRN

## 2012-07-02 MED ORDER — LIRAGLUTIDE 18 MG/3ML ~~LOC~~ SOLN: 1.8000 mg | Freq: Every day | SUBCUTANEOUS | Status: DC

## 2012-07-02 MED ORDER — INSULIN ASPART 100 UNIT/ML ~~LOC~~ SOLN
30.0000 [IU] | Freq: Three times a day (TID) | SUBCUTANEOUS | Status: DC
  Administered 2012-07-02 – 2012-07-03 (×3): 30 [IU] via SUBCUTANEOUS

## 2012-07-02 MED ORDER — FENTANYL CITRATE 0.05 MG/ML IJ SOLN
INTRAMUSCULAR | Status: DC | PRN
  Administered 2012-07-02 (×5): 50 ug via INTRAVENOUS

## 2012-07-02 MED ORDER — ACETAMINOPHEN 10 MG/ML IV SOLN
1000.0000 mg | Freq: Four times a day (QID) | INTRAVENOUS | Status: AC
  Administered 2012-07-02 – 2012-07-03 (×3): 1000 mg via INTRAVENOUS
  Filled 2012-07-02 (×3): qty 100

## 2012-07-02 MED ORDER — ONDANSETRON HCL 4 MG PO TABS: 4.0000 mg | ORAL_TABLET | Freq: Four times a day (QID) | ORAL | Status: DC | PRN

## 2012-07-02 MED ORDER — DEXAMETHASONE SODIUM PHOSPHATE 10 MG/ML IJ SOLN
INTRAMUSCULAR | Status: DC | PRN
  Administered 2012-07-02: 10 mg via INTRAVENOUS

## 2012-07-02 MED ORDER — OXYCODONE HCL 5 MG PO TABS: 5.0000 mg | ORAL_TABLET | Freq: Once | ORAL | Status: DC | PRN

## 2012-07-02 MED ORDER — METFORMIN HCL ER 500 MG PO TB24: 1000.0000 mg | ORAL_TABLET | Freq: Two times a day (BID) | ORAL | Status: DC

## 2012-07-02 MED ORDER — ROCURONIUM BROMIDE 100 MG/10ML IV SOLN
INTRAVENOUS | Status: DC | PRN
  Administered 2012-07-02: 40 mg via INTRAVENOUS
  Administered 2012-07-02: 10 mg via INTRAVENOUS

## 2012-07-02 MED ORDER — NEOSTIGMINE METHYLSULFATE 1 MG/ML IJ SOLN
INTRAMUSCULAR | Status: DC | PRN
Start: 2012-07-02 — End: 2012-07-02
  Administered 2012-07-02: 5 mg via INTRAVENOUS

## 2012-07-02 MED ORDER — ONDANSETRON HCL 4 MG/2ML IJ SOLN
INTRAMUSCULAR | Status: DC | PRN
  Administered 2012-07-02: 4 mg via INTRAVENOUS

## 2012-07-02 MED ORDER — KETAMINE HCL 10 MG/ML IJ SOLN
INTRAMUSCULAR | Status: DC | PRN
  Administered 2012-07-02: 20 mg via INTRAVENOUS

## 2012-07-02 MED ORDER — LISINOPRIL 20 MG PO TABS
20.0000 mg | ORAL_TABLET | Freq: Every day | ORAL | Status: DC
  Administered 2012-07-02: 20 mg via ORAL
  Filled 2012-07-02 (×2): qty 1

## 2012-07-02 MED ORDER — ACETAMINOPHEN 10 MG/ML IV SOLN
INTRAVENOUS | Status: DC | PRN
  Administered 2012-07-02: 1000 mg via INTRAVENOUS

## 2012-07-02 MED ORDER — PROPOFOL 10 MG/ML IV BOLUS
INTRAVENOUS | Status: DC | PRN
  Administered 2012-07-02: 150 mg via INTRAVENOUS

## 2012-07-02 MED ORDER — ONDANSETRON HCL 4 MG/2ML IJ SOLN: 4.0000 mg | Freq: Four times a day (QID) | INTRAMUSCULAR | Status: DC | PRN

## 2012-07-02 MED ORDER — HYDROMORPHONE HCL PF 1 MG/ML IJ SOLN
INTRAMUSCULAR | Status: DC | PRN
  Administered 2012-07-02 (×2): 1 mg via INTRAVENOUS

## 2012-07-02 MED ORDER — LABETALOL HCL 200 MG PO TABS
200.0000 mg | ORAL_TABLET | Freq: Two times a day (BID) | ORAL | Status: DC
  Administered 2012-07-02: 200 mg via ORAL
  Filled 2012-07-02 (×3): qty 1

## 2012-07-02 MED ORDER — SUCCINYLCHOLINE CHLORIDE 20 MG/ML IJ SOLN
INTRAMUSCULAR | Status: DC | PRN
  Administered 2012-07-02: 100 mg via INTRAVENOUS

## 2012-07-02 MED ORDER — LACTATED RINGERS IV SOLN
INTRAVENOUS | Status: DC | PRN
  Administered 2012-07-02: 07:00:00 via INTRAVENOUS

## 2012-07-02 MED ORDER — METFORMIN HCL ER 500 MG PO TB24
1000.0000 mg | ORAL_TABLET | Freq: Two times a day (BID) | ORAL | Status: DC
  Administered 2012-07-03: 1000 mg via ORAL
  Filled 2012-07-02 (×3): qty 2

## 2012-07-02 MED ORDER — MIDAZOLAM HCL 5 MG/5ML IJ SOLN
INTRAMUSCULAR | Status: DC | PRN
  Administered 2012-07-02: 2 mg via INTRAVENOUS

## 2012-07-02 MED ORDER — CANAGLIFLOZIN 300 MG PO TABS: ORAL_TABLET | Freq: Every morning | ORAL | Status: DC

## 2012-07-02 MED ORDER — LISINOPRIL-HYDROCHLOROTHIAZIDE 20-12.5 MG PO TABS: 1.0000 | ORAL_TABLET | Freq: Every day | ORAL | Status: DC

## 2012-07-02 MED ORDER — OXYCODONE-ACETAMINOPHEN 5-325 MG PO TABS
1.0000 | ORAL_TABLET | ORAL | Status: DC | PRN
  Administered 2012-07-02 – 2012-07-03 (×3): 2 via ORAL
  Filled 2012-07-02 (×3): qty 2

## 2012-07-02 MED ORDER — HYDROMORPHONE HCL PF 1 MG/ML IJ SOLN: 0.2500 mg | INTRAMUSCULAR | Status: DC | PRN

## 2012-07-02 MED ORDER — ATORVASTATIN CALCIUM 10 MG PO TABS
10.0000 mg | ORAL_TABLET | Freq: Every morning | ORAL | Status: DC
  Filled 2012-07-02: qty 1

## 2012-07-02 MED ORDER — LACTATED RINGERS IR SOLN
Status: DC | PRN
  Administered 2012-07-02: 1000 mL

## 2012-07-02 MED ORDER — DEXTROSE 5 % IV SOLN
3.0000 g | INTRAVENOUS | Status: AC
  Administered 2012-07-02: 3 g via INTRAVENOUS

## 2012-07-02 MED ORDER — INSULIN GLARGINE 100 UNIT/ML ~~LOC~~ SOLN
80.0000 [IU] | Freq: Every day | SUBCUTANEOUS | Status: DC
  Administered 2012-07-02: 80 [IU] via SUBCUTANEOUS
  Filled 2012-07-02 (×2): qty 0.8

## 2012-07-02 MED ORDER — ACETAMINOPHEN 10 MG/ML IV SOLN: 1000.0000 mg | Freq: Once | INTRAVENOUS | Status: DC | PRN

## 2012-07-02 MED ORDER — LIDOCAINE HCL (CARDIAC) 20 MG/ML IV SOLN
INTRAVENOUS | Status: DC | PRN
  Administered 2012-07-02: 100 mg via INTRAVENOUS

## 2012-07-02 MED ORDER — LABETALOL HCL 5 MG/ML IV SOLN
INTRAVENOUS | Status: DC | PRN
  Administered 2012-07-02: 5 mg via INTRAVENOUS

## 2012-07-02 MED ORDER — HYDROCHLOROTHIAZIDE 12.5 MG PO CAPS
12.5000 mg | ORAL_CAPSULE | Freq: Every day | ORAL | Status: DC
  Administered 2012-07-02: 12.5 mg via ORAL
  Filled 2012-07-02 (×2): qty 1

## 2012-07-02 MED ORDER — INSULIN ASPART 100 UNIT/ML ~~LOC~~ SOLN
0.0000 [IU] | Freq: Three times a day (TID) | SUBCUTANEOUS | Status: DC
  Administered 2012-07-02 – 2012-07-03 (×2): 4 [IU] via SUBCUTANEOUS

## 2012-07-02 MED ORDER — INSULIN ASPART 100 UNIT/ML ~~LOC~~ SOLN
0.0000 [IU] | SUBCUTANEOUS | Status: DC
  Administered 2012-07-02: 3 [IU] via SUBCUTANEOUS

## 2012-07-02 MED ORDER — LIRAGLUTIDE 18 MG/3ML ~~LOC~~ SOPN: 1.8000 mg | PEN_INJECTOR | Freq: Every morning | SUBCUTANEOUS | Status: DC

## 2012-07-02 SURGICAL SUPPLY — 54 items
BENZOIN TINCTURE PRP APPL 2/3 (GAUZE/BANDAGES/DRESSINGS) ×2 IMPLANT
CHLORAPREP W/TINT 26ML (MISCELLANEOUS) ×2 IMPLANT
CLOTH BEACON ORANGE TIMEOUT ST (SAFETY) ×2 IMPLANT
CORD HIGH FREQUENCY UNIPOLAR (ELECTROSURGICAL) ×2 IMPLANT
CORDS BIPOLAR (ELECTRODE) ×2 IMPLANT
COVER MAYO STAND STRL (DRAPES) ×2 IMPLANT
COVER SURGICAL LIGHT HANDLE (MISCELLANEOUS) ×2 IMPLANT
COVER TIP SHEARS 8 DVNC (MISCELLANEOUS) ×1 IMPLANT
COVER TIP SHEARS 8MM DA VINCI (MISCELLANEOUS) ×1
DRAPE LG THREE QUARTER DISP (DRAPES) ×4 IMPLANT
DRAPE SURG IRRIG POUCH 19X23 (DRAPES) ×2 IMPLANT
DRAPE TABLE BACK 44X90 PK DISP (DRAPES) ×4 IMPLANT
DRAPE UTILITY XL STRL (DRAPES) ×2 IMPLANT
DRAPE WARM FLUID 44X44 (DRAPE) ×2 IMPLANT
DRSG TEGADERM 2-3/8X2-3/4 SM (GAUZE/BANDAGES/DRESSINGS) ×2 IMPLANT
DRSG TEGADERM 4X4.75 (GAUZE/BANDAGES/DRESSINGS) ×2 IMPLANT
DRSG TEGADERM 6X8 (GAUZE/BANDAGES/DRESSINGS) ×4 IMPLANT
ELECT REM PT RETURN 9FT ADLT (ELECTROSURGICAL) ×2
ELECTRODE REM PT RTRN 9FT ADLT (ELECTROSURGICAL) ×1 IMPLANT
GLOVE BIO SURGEON STRL SZ 6.5 (GLOVE) ×4 IMPLANT
GLOVE BIO SURGEON STRL SZ7.5 (GLOVE) IMPLANT
GLOVE BIOGEL PI IND STRL 6 (GLOVE) ×1 IMPLANT
GLOVE BIOGEL PI IND STRL 7.0 (GLOVE) ×2 IMPLANT
GLOVE BIOGEL PI INDICATOR 6 (GLOVE) ×1
GLOVE BIOGEL PI INDICATOR 7.0 (GLOVE) ×2
GLOVE ECLIPSE 6.5 STRL STRAW (GLOVE) ×4 IMPLANT
GLOVE SURG SS PI 6.0 STRL IVOR (GLOVE) ×2 IMPLANT
GOWN PREVENTION PLUS XLARGE (GOWN DISPOSABLE) IMPLANT
GOWN STRL NON-REIN LRG LVL3 (GOWN DISPOSABLE) ×10 IMPLANT
HOLDER FOLEY CATH W/STRAP (MISCELLANEOUS) ×2 IMPLANT
KIT ACCESSORY DA VINCI DISP (KITS) ×1
KIT ACCESSORY DVNC DISP (KITS) ×1 IMPLANT
MANIPULATOR UTERINE 4.5 ZUMI (MISCELLANEOUS) ×2 IMPLANT
OCCLUDER COLPOPNEUMO (BALLOONS) ×2 IMPLANT
PACK LAPAROSCOPY W LONG (CUSTOM PROCEDURE TRAY) ×2 IMPLANT
POUCH SPECIMEN RETRIEVAL 10MM (ENDOMECHANICALS) IMPLANT
SET TUBE IRRIG SUCTION NO TIP (IRRIGATION / IRRIGATOR) ×2 IMPLANT
SHEET LAVH (DRAPES) ×2 IMPLANT
SOLUTION ELECTROLUBE (MISCELLANEOUS) ×2 IMPLANT
SPONGE LAP 18X18 X RAY DECT (DISPOSABLE) IMPLANT
STRIP CLOSURE SKIN 1/2X4 (GAUZE/BANDAGES/DRESSINGS) ×4 IMPLANT
SUT VIC AB 0 CT1 27 (SUTURE) ×2
SUT VIC AB 0 CT1 27XBRD ANTBC (SUTURE) ×2 IMPLANT
SUT VIC AB 4-0 PS2 27 (SUTURE) ×4 IMPLANT
SUT VICRYL 0 UR6 27IN ABS (SUTURE) ×2 IMPLANT
SYR 50ML LL SCALE MARK (SYRINGE) ×2 IMPLANT
SYR BULB IRRIGATION 50ML (SYRINGE) IMPLANT
TOWEL OR 17X26 10 PK STRL BLUE (TOWEL DISPOSABLE) ×4 IMPLANT
TRAP SPECIMEN MUCOUS 40CC (MISCELLANEOUS) IMPLANT
TRAY FOLEY CATH 14FRSI W/METER (CATHETERS) ×2 IMPLANT
TROCAR 12M 150ML BLUNT (TROCAR) ×2 IMPLANT
TROCAR XCEL 12X100 BLDLESS (ENDOMECHANICALS) ×2 IMPLANT
TROCAR XCEL BLADELESS 5X75MML (TROCAR) ×2 IMPLANT
WATER STERILE IRR 1500ML POUR (IV SOLUTION) IMPLANT

## 2012-07-02 NOTE — Preoperative (Signed)
Beta Blockers   Reason not to administer Beta Blockers:Not Applicable Pt took Beta Blocker this morning 07-02-12

## 2012-07-02 NOTE — Anesthesia Postprocedure Evaluation (Signed)
Anesthesia Post Note  Patient: Kelli Allen  Procedure(s) Performed: Procedure(s) (LRB): ROBOTIC ASSISTED TOTAL HYSTERECTOMY WITH BILATERAL SALPINGO OOPHORECTOMY  (Bilateral)  Anesthesia type: General  Patient location: PACU  Post pain: Pain level controlled  Post assessment: Post-op Vital signs reviewed  Last Vitals: BP 148/83  Pulse 81  Temp(Src) 36.7 C (Oral)  Resp 14  SpO2 95%  Post vital signs: Reviewed  Level of consciousness: sedated  Complications: No apparent anesthesia complications

## 2012-07-02 NOTE — Transfer of Care (Signed)
Immediate Anesthesia Transfer of Care Note  Patient: Kelli Allen  Procedure(s) Performed: Procedure(s) (LRB): ROBOTIC ASSISTED TOTAL HYSTERECTOMY WITH BILATERAL SALPINGO OOPHORECTOMY  (Bilateral)  Patient Location: PACU  Anesthesia Type: General  Level of Consciousness: sedated, patient cooperative and responds to stimulaton  Airway & Oxygen Therapy: Patient Spontanous Breathing and Patient connected to face mask oxgen  Post-op Assessment: Report given to PACU RN and Post -op Vital signs reviewed and stable  Post vital signs: Reviewed and stable  Complications: No apparent anesthesia complications

## 2012-07-02 NOTE — Interval H&P Note (Signed)
History and Physical Interval Note:  07/02/2012 7:09 AM  Kelli Allen  has presented today for surgery, with the diagnosis of Endometrial cancer  The various methods of treatment have been discussed with the patient and family. After consideration of risks, benefits and other options for treatment, the patient has consented to  Procedure(s): ROBOTIC ASSISTED TOTAL HYSTERECTOMY WITH BILATERAL SALPINGO OOPHORECTOMY POSSIBLE LYMPH NODE DISECTION (Bilateral) as a surgical intervention .  The patient's history has been reviewed, patient examined, no change in status, stable for surgery.  I have reviewed the patient's chart and labs.  Questions were answered to the patient's satisfaction.  She understands the risks of surgery and the risks of needing to proceed with laparotomy.   Taiven Greenley A.

## 2012-07-02 NOTE — Anesthesia Preprocedure Evaluation (Addendum)
Anesthesia Evaluation  Patient identified by MRN, date of birth, ID band Patient awake    Reviewed: Allergy & Precautions, H&P , Patient's Chart, lab work & pertinent test results, reviewed documented beta blocker date and time   History of Anesthesia Complications Negative for: history of anesthetic complications  Airway Mallampati: I TM Distance: >3 FB Neck ROM: full    Dental no notable dental hx. (+) Teeth Intact and Dental Advisory Given   Pulmonary asthma (last inhaler use 3-4 days ago, no hospitalizations) ,  breath sounds clear to auscultation  Pulmonary exam normal       Cardiovascular Exercise Tolerance: Good hypertension, On Home Beta Blockers and Pt. on medications Rhythm:regular Rate:Normal     Neuro/Psych Diabetic neuropathy - toes/feet negative psych ROS   GI/Hepatic negative GI ROS, Neg liver ROS,   Endo/Other  diabetes (178), Type 2, Insulin DependentMorbid obesityPCOS  Renal/GU negative Renal ROS  Female GU complaint     Musculoskeletal   Abdominal   Peds  Hematology negative hematology ROS (+)   Anesthesia Other Findings Diabetes mellitus     Polycystic ovarian syndrome        Cellulitis of lower leg     Hypertension        Asthma     Irregular menstrual bleeding        PCOS (polycystic ovarian syndrome)     Tachycardia        Abnormal Pap smear 2000   Endometrial ca 12/20/2011    Reproductive/Obstetrics negative OB ROS                           Anesthesia Physical  Anesthesia Plan  ASA: III  Anesthesia Plan: General   Post-op Pain Management:    Induction: Intravenous  Airway Management Planned: Oral ETT  Additional Equipment:   Intra-op Plan:   Post-operative Plan: Extubation in OR  Informed Consent: I have reviewed the patients History and Physical, chart, labs and discussed the procedure including the risks, benefits and alternatives for the proposed  anesthesia with the patient or authorized representative who has indicated his/her understanding and acceptance.   Dental advisory given  Plan Discussed with: CRNA  Anesthesia Plan Comments:         Anesthesia Quick Evaluation

## 2012-07-02 NOTE — Op Note (Signed)
PATIENT: Rateel Beldin Bonk DATE OF BIRTH: 03/28/1975 ENCOUNTER DATE: 07/02/2012   Preop Diagnosis: grade 1 endometrioid adenocarcinoma.   Postoperative Diagnosis: same.   Surgery: Total robotic hysterectomy, bilateral salpingo-oophorectomy  Surgeons:  Jiovanny Burdell A. Duard Brady, MD; Antionette Char, MD   Assistant: None  Anesthesia: General   Estimated blood loss: 50 ml   IVF: 1500 ml   Urine output: 100 ml   Complications: None   Pathology: Uterus, cervix, bilateral tubes and ovaries  Operative findings: Significant intraabdominal and mesenteric adiposity. Normal uterus and adnexa otherwise.  Frozen section revealed a grade 1 endometrial adenocarcinoma with minimal myometrial invasion.   Procedure: The patient was identified in the preoperative holding area. Informed consent was signed on the chart. Patient was seen history was reviewed and exam was performed.   The patient was then taken to the operating room and placed in the supine position with SCD hose on. General anesthesia was then induced without difficulty. She was then placed in the dorsolithotomy position. Her arms were tucked at her side with appropriate precautions on the gel pad and sleds. Shoulder blocks were then placed in the usual fashion with appropriate precautions. A OG-tube was placed to suction. First timeout was performed to confirm the patient, procedure, antibiotic, allergy status, estimated blood loss and OR time. The perineum was then prepped in the usual fashion with Betadine. A 14 French Foley was inserted into the bladder under sterile conditions. A sterile speculum was placed in the vagina. The cervix was without lesions. The cervix was grasped with a single-tooth tenaculum. The dilator without difficulty. A ZUMI with a medium Koe ring was placed without difficulty. The abdomen was then prepped with 2 Chlor prep sponges per protocol.   Patient was then draped after the prep was dried. Second timeout was  performed to confirm the above. After again confirming OG tube placement and it was to suction. A stab-wound was made in left upper quadrant 2 cm below the costal margin on the left in the midclavicular line. A 5 mm operative report was used to assure intra-abdominal placement. The abdomen was insufflated. At this point all points during the procedure the patient's intra-abdominal pressure was not increased over 13 mm of mercury. After insufflation was complete, the patient was placed in deep Trendelenburg position. 25 cm above the pubic symphysis that area was marked the camera port. Bilateral robotic ports were marked 10 cm from the midline incision at approximately 5 angle. The fourth arm was marked medial and superior to the ASIS.  Under direct visualization each of the trochars was placed into the abdomen. The small bowel was folded on its mesentery to allow visualization to the pelvis. However, due to obesity, visualization was very difficult. The 5 mm LUQ port was then converted to a 10/12 port under direct visualization.  After assuring adequate visualization, the robot was then docked in the usual fashion. Under direct visualization the robotic instruments replaced.   The round ligament on the patient's right side was transected with monopolar cautery. The anterior and posterior leaves of the broad ligament were then taken down in the usual fashion.  A window was made between the IP and the ureter. The IP was coagulated with bipolar cautery and transected. The posterior leaf of the broad ligament was taken down to the level of the KOH ring. The bladder flap was created using meticulous dissection and pinpoint cautery. The uterine vessels were coagulated with bipolar cautery. The uterine vessels were then transected and the C  loop was created. The same procedure was performed on the patient's left side.   The pneumo-occulder in the vagina was then insufflated. The colpotomy was then created in the usual  fashion. The specimen was then delivered to the vagina and sent for frozen section there was some bleeding noted on the uterine vessels on her left side. These were coagulated with bipolar cautery.  The frozen section returned as minimal myometrial invasion of grade 1 lesion. The decision was made to stop the procedure is no lymphadenectomy was indicated and visualization was very limited to perform the lymphadenectomy.  The vaginal cuff was closed with a running 0 Vicryl on CT 1 suture x2 met in the midline. The abdomen and pelvis were copiously irrigated and noted to be hemostatic. The robotic instruments were removed under direct visualization as were the robotic trochars. The pneumoperitoneum was removed. The patient was then taken out of the Trendelenburg position. Using of 0 Vicryl on a UR 6 needle the midline port fascia was closed after grasping it with allis clamps. The subcutaneous tissues of the port in the left upper quadrant ports were reapproximated. The skin was closed using 4-0 Vicryl. Steri-Strips and benzoin were applied. The pneumo occluded balloon was removed from the vagina. The vagina was swabbed and noted to be hemostatic.   All instrument needle and Ray-Tec counts were correct x2. The patient tolerated the procedure well and was taken to the recovery room in stable condition. This is Cleda Mccreedy dictating an operative note on patient SESILIA POUCHER.

## 2012-07-02 NOTE — H&P (View-Only) (Signed)
Consult Note: Gyn-Onc  Kelli Allen 37 y.o. female  CC:  Chief Complaint  Patient presents with  . Endometrial Cancer    Follow up    HPI:  Patient is a 37-year-old gravida 0 para 0 who remembers having irregular periods in which he passed some tissue in October of 2012. She was seen by Dr. Dillard and apparently Dr. Dillard wanted to perform a history of sonogram but the patient declined. She was seen in followup with Dr. Dillard in September was started on birth control pills. In October or November she had some spotting and it was fairly regular for a few days. She ultimately underwent a hysteroscopy D&C that Dr. Dillard and 1 he to perform and underwent an endometrial biopsy on November 7. [name] 37-year-old a polyp. The uterus measures 8.04 x 4.3 cm with normal appearing adnexa. There was a 1.2 cm polyp noted. She had an endometrial biopsy that revealed a grade 1 endometrial carcinoma arising atypical complex hyperplasia. The patient wishes to retain her fertility and subsequent comes in today still see us.  She underwent a D&C IUD placement with Dr. Dillard on January 15. Pathology revealed an endometrioid adenocarcinoma grade 1 arising in a background of atypical complex hyperplasia. She also had some benign melanotic nevi removed at that time.  She got a second opinion with Dr. Tomlin who told her that the most definitive treatment would be hysterectomy. She comes in today for followup. She continues to struggle with the decision to undergo a hysterectomy. She comes accompanied by 2 friends today. She has brought down her A1c to 7.  We spent about 10 minutes discussing hysterectomy as the most definitive treatment for her condition. Also discussed with her that she currently has an IUD in place. We could do a biopsy today as the IUD was placed approximately 3 months to see if there's been any progression of her disease. If it appears that the IUD is helping her with her  endometrial cancer she could continue this for up to a year and if there's no evidence of hyperplasia or cancer at that time she could then seek assistance of infertility specialist. Alternatively if the biopsy today again shows endometrial cancer that might help her be able to come to grips better with the decision of having a hysterectomy as she will know that she has pursued all of the conservative treatment options without success. After our discussion she wishes to proceed with an endometrial biopsy today.   Review of Systems: I performed an endometrial biopsy on her in April that revealed persistent adenocarcinoma. She decided she wanted to proceed with hysterectomy. She and I spoke in May and she was insistent on a hysterectomy. We discussed close regarding control of her sugars as well as weight loss in anticipation of surgery. She stated that she felt that she could lose 20 pounds in a month. She comes in today for followup she has only lost 3 pounds since I last saw her. She states that her diet was off 1500 calories and she has seen her endocrinologist was dropped to 1200 calories. He recommended that she decrease eating processed foods and increasing her exercise. She's walking but amount half per day. Her last A1c was 7.1. Her sugars are 70-115. Per her endocrinologist if her A1c was less than 7.5 it would be okay for her to move forward with surgery.  Current Meds:  Outpatient Encounter Prescriptions as of 06/20/2012  Medication Sig Dispense Refill  .   Albuterol Sulfate (PROAIR HFA IN) Inhale into the lungs.      . atorvastatin (LIPITOR) 10 MG tablet Take 10 mg by mouth daily.      . Canagliflozin (INVOKANA) 300 MG TABS Take by mouth every morning.       . clobetasol cream (TEMOVATE) 0.05 % Apply topically 2 (two) times daily.  30 g  0  . gabapentin (NEURONTIN) 100 MG capsule Take 100 mg by mouth 3 (three) times daily.      . HYDROcodone-acetaminophen (VICODIN) 5-500 MG per tablet Take 1  tablet by mouth every 6 (six) hours as needed for pain.  30 tablet  0  . ibuprofen (ADVIL,MOTRIN) 600 MG tablet Take 1 tablet (600 mg total) by mouth every 6 (six) hours as needed for pain.  30 tablet  0  . insulin aspart (NOVOLOG) 100 UNIT/ML injection Inject 20 Units into the skin 3 (three) times daily with meals.      . insulin glargine (LANTUS) 100 UNIT/ML injection Inject 80 Units into the skin at bedtime.      . labetalol (NORMODYNE) 200 MG tablet Take 200 mg by mouth 2 (two) times daily.      . Lidocaine 0.5 % GEL Apply 0.5 application topically as needed.  1 Tube  0  . Liraglutide (VICTOZA) 18 MG/3ML SOLN Inject 1.8 mg into the skin daily.       . lisinopril-hydrochlorothiazide (PRINZIDE,ZESTORETIC) 20-12.5 MG per tablet Take 1 tablet by mouth at bedtime.       . metformin (FORTAMET) 500 MG (OSM) 24 hr tablet Take 1,000 mg by mouth 2 (two) times daily with a meal.       . omega-3 acid ethyl esters (LOVAZA) 1 G capsule Take 1 g by mouth 2 (two) times daily. Nutralite Brand.      . OVER THE COUNTER MEDICATION Take 1 packet by mouth daily. Nutralite Vitamins      . OVER THE COUNTER MEDICATION Take 3 tablets by mouth daily. Tri-Iron  Folic Acid 200mcg Vit C 30mg Fe 10mg      . OVER THE COUNTER MEDICATION Take 6 tablets by mouth daily. Slimmetry Diet supplement Green Tea 540mg/2tabs Yerbamate Extract 150mg/day Birch Tree extract 160mg Cloleus Foskelii Ext 50mg      . OVER THE COUNTER MEDICATION Take 1 tablet by mouth daily. Glucose Health Cromium Piccolate       No facility-administered encounter medications on file as of 06/20/2012.    Allergy: No Known Allergies  Social Hx:  She denies tobacco. She drinks alcohol socially she has a drug use. Her husband is with an accountant electrician but she is married. 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  History   Social History  . Marital Status: Married    Spouse Name: N/A    Number of Children: N/A  . Years of Education: N/A    Occupational History  . Not on file.   Social History Main Topics  . Smoking status: Never Smoker   . Smokeless tobacco: Not on file  . Alcohol Use: Yes     Comment: social  . Drug Use: No  . Sexually Active: Yes    Birth Control/ Protection: IUD     Comment: mirena    Other Topics Concern  . Not on file   Social History Narrative  . No narrative on file    Past Surgical Hx:  Past Surgical History  Procedure Laterality Date  . No past surgeries    .   Hysteroscopy w/d&c  01/24/2012    Procedure: DILATATION AND CURETTAGE /HYSTEROSCOPY;  Surgeon: Naima A Dillard, MD;  Location: [facility];  Service: Gynecology;  Laterality: N/A;  with vulvar biopsies   . Intrauterine device (iud) insertion  01/24/2012    Procedure: INTRAUTERINE DEVICE (IUD) INSERTION;  Surgeon: Naima A Dillard, MD;  Location: [facility];  Service: Gynecology;  Laterality: N/A;    Past Medical Hx:  Past Medical History  Diagnosis Date  . Diabetes mellitus   . Polycystic ovarian syndrome   . Cellulitis of lower leg   . Hypertension   . Asthma   . Irregular menstrual bleeding   . PCOS (polycystic ovarian syndrome)   . Tachycardia   . Abnormal Pap smear 2000  . Endometrial ca 12/20/2011    Family Hx: She is one of 7 girls. Although 2 of her sisters have depression. She has one sister with mitochondrial myopathy. Her paternal grandfather died of lung cancer she was a smoker. Maternal grandmother may have had bone "cancer" Family History  Problem Relation Age of Onset  . Diabetes type II Mother   . Coronary artery disease Mother   . Diabetes type II Father   . Coronary artery disease Father   . Heart disease Sister     Vitals:  Blood pressure 120/80, pulse 70, temperature 97.9 F (36.6 C), temperature source Oral, resp. rate 18, height 5' 5" (1.651 m), weight 289 lb 3.2 oz (131.18 kg).  Physical Exam:  Well-nourished well-developed female in no acute distress. BMI 38.5   Assessment/Plan: 36-year-old  gravida 0 with a grade 1 endometrial carcinoma as well as an endometrial polyp on sonohysterogram. The patient wishes to maintain fertility. I discussed with her the standard of care for the treatment of endometrial cancer would be a hysterectomy.   She had an MRI of the abdomen and pelvis. There was no evidence of deep myometrial invasion on MRI and no lymphadenopathy was noted.  She then underwent a D&C with IUD placement. April biopsy revealed:  Diagnosis Endometrium, biopsy - COMPLEX ATYPICAL HYPERPLASIA WITH MICROSCOPIC FOCI SUSPICIOUS FOR RESIDUAL WELLDIFFERENTIATED ADENOCARCINOMA.  She was just proceed with definitive treatment. She certainly scheduled for surgery on June 24. That'll involve a robotic hysterectomy BSO and possible lymph nodes. She understands risk for laparotomy based on her morbid obesity.    Samyia Motter A., MD 06/20/2012, 4:42 PM   

## 2012-07-03 ENCOUNTER — Encounter (HOSPITAL_COMMUNITY): Payer: Self-pay | Admitting: Gynecologic Oncology

## 2012-07-03 DIAGNOSIS — Z8542 Personal history of malignant neoplasm of other parts of uterus: Secondary | ICD-10-CM | POA: Diagnosis present

## 2012-07-03 LAB — BASIC METABOLIC PANEL
Chloride: 103 mEq/L (ref 96–112)
Creatinine, Ser: 0.7 mg/dL (ref 0.50–1.10)
GFR calc Af Amer: >90 mL/min (ref >90)
Potassium: 3.8 mEq/L (ref 3.5–5.1)
Sodium: 139 mEq/L (ref 135–145)

## 2012-07-03 LAB — CBC
HCT: 43.3 % (ref 36.0–46.0)
Hemoglobin: 14 g/dL (ref 12.0–15.0)
MCH: 32.4 pg (ref 26.0–34.0)
MCHC: 32.3 g/dL (ref 30.0–36.0)
MCV: 100.2 fL — ABNORMAL HIGH (ref 78.0–100.0)
Platelets: 257 10*3/uL (ref 150–400)
RBC: 4.32 MIL/uL (ref 3.87–5.11)
RDW: 13.3 % (ref 11.5–15.5)
WBC: 13.4 10*3/uL — ABNORMAL HIGH (ref 4.0–10.5)

## 2012-07-03 LAB — GLUCOSE, CAPILLARY: Glucose-Capillary: 188 mg/dL — ABNORMAL HIGH (ref 70–99)

## 2012-07-03 MED ORDER — OXYCODONE-ACETAMINOPHEN 5-325 MG PO TABS: 1.0000 | ORAL_TABLET | ORAL | Status: DC | PRN

## 2012-07-03 NOTE — Discharge Summary (Signed)
Physician Discharge Summary  Patient ID: Kelli Allen MRN: [id number] DOB/AGE: 1975-01-12 37 y.o.  Admit date: 07/02/2012 Discharge date: 07/03/2012  Admission Diagnoses: Active Problems:   Endometrial cancer  Discharge Diagnoses:  Active Problems:   Endometrial cancer   Discharged Condition: good  Hospital Course: On 07/02/2012, the patient underwent the following: Procedure(s): ROBOTIC ASSISTED TOTAL HYSTERECTOMY WITH BILATERAL SALPINGO OOPHORECTOMY .   The postoperative course was uneventful.  She was discharged to home on postoperative day 1 tolerating a regular diet.  Consults: None  Significant Diagnostic Studies: none  Treatments: surgery: see above  Discharge Exam: Blood pressure 123/75, pulse 89, temperature 97.7 F (36.5 C), temperature source Oral, resp. rate 18, height 5\' 5"  (1.651 m), weight 290 lb 0.6 oz (131.561 kg), SpO2 95.00%. General appearance: alert GI: soft, non-tender; bowel sounds normal; no masses,  no organomegaly Extremities: extremities normal, atraumatic, no cyanosis or edema Incision/Wound:  C/D/I  Disposition: 01-Home or Self Care  Discharge Orders   Future Orders Complete By Expires     Call MD for:  extreme fatigue  As directed     Call MD for:  persistant dizziness or light-headedness  As directed     Call MD for:  persistant nausea and vomiting  As directed     Call MD for:  redness, tenderness, or signs of infection (pain, swelling, redness, odor or green/yellow discharge around incision site)  As directed     Call MD for:  severe uncontrolled pain  As directed     Call MD for:  temperature >100.4  As directed     Diet Carb Modified  As directed     Discharge wound care:  As directed     Comments:      Keep clean and dry    Driving Restrictions  As directed     Comments:      No driving while taking narcotics    Increase activity slowly  As directed     Lifting restrictions  As directed     Comments:      No lifting > 5 lbs  for 6 weeks    May shower / Bathe  As directed     Comments:      No tub baths for 6 weeks    May walk up steps  As directed     Sexual Activity Restrictions  As directed     Comments:      No intercourse for 8 weeks        Medication List    TAKE these medications       oxyCODONE-acetaminophen 5-325 MG per tablet  Commonly known as:  PERCOCET/ROXICET  Take 1-2 tablets by mouth every 4 (four) hours as needed.      ASK your doctor about these medications       atorvastatin 10 MG tablet  Commonly known as:  LIPITOR  Take 10 mg by mouth every morning.     gabapentin 100 MG capsule  Commonly known as:  NEURONTIN  Take 300 mg by mouth at bedtime.     insulin aspart 100 UNIT/ML injection  Commonly known as:  novoLOG  Inject 30 Units into the skin 3 (three) times daily with meals.     insulin glargine 100 UNIT/ML injection  Commonly known as:  LANTUS  Inject 80 Units into the skin at bedtime.     INVOKANA 300 MG Tabs  Generic drug:  Canagliflozin  Take by mouth every morning.  labetalol 200 MG tablet  Commonly known as:  NORMODYNE  Take 200 mg by mouth 2 (two) times daily.     lisinopril-hydrochlorothiazide 20-12.5 MG per tablet  Commonly known as:  PRINZIDE,ZESTORETIC  Take 1 tablet by mouth at bedtime.     metformin 500 MG (OSM) 24 hr tablet  Commonly known as:  FORTAMET  Take 1,000 mg by mouth 2 (two) times daily with a meal.     NON FORMULARY  Take 1 capsule by mouth 2 (two) times daily. nutralite omega 3     NON FORMULARY  Take 1 tablet by mouth 2 (two) times daily. nutralite clear now to clear up rosacia     OVER THE COUNTER MEDICATION  Take 1 packet by mouth 2 (two) times daily. Nutralite multi  Vitamins     OVER THE COUNTER MEDICATION  Take 3 tablets by mouth 2 (two) times daily. Tri-Iron   Folic Acid  Vit C 30mg   Fe 10mg      OVER THE COUNTER MEDICATION  Take 1 tablet by mouth 2 (two) times daily. Glucose Health  Cromium Piccolate      VICTOZA 18 MG/3ML Soln injection  Generic drug:  Liraglutide  Inject 1.8 mg into the skin daily.           Follow-up Information   Call GEHRIG,PAOLA A., MD.   Contact information:   [address] [place] [address] [phone]       Signed: Roseanna Rainbow 07/03/2012, 8:37 AM

## 2012-07-04 MED FILL — Heparin Sodium (Porcine) Inj 1000 Unit/ML: INTRAMUSCULAR | Qty: 1 | Status: AC

## 2012-07-08 ENCOUNTER — Telehealth: Payer: Self-pay | Admitting: Gynecologic Oncology

## 2012-07-08 NOTE — Telephone Encounter (Signed)
Message left asking the patient to please call the office with an update on her post-operative status.

## 2012-07-10 ENCOUNTER — Telehealth: Payer: Self-pay | Admitting: Gynecologic Oncology

## 2012-07-10 DIAGNOSIS — R109 Unspecified abdominal pain: Secondary | ICD-10-CM

## 2012-07-10 MED ORDER — OXYCODONE-ACETAMINOPHEN 5-325 MG PO TABS: 1.0000 | ORAL_TABLET | Freq: Four times a day (QID) | ORAL | Status: DC | PRN

## 2012-07-10 NOTE — Telephone Encounter (Signed)
Returned call to patient about requesting additional pain medication.  Reporting moderate abdominal pain intermittently.  Stating "I strained a muscle in my stomach last night."  Patient to come to the office to pick up a new prescription for Percocet one to two tablets Q6H as needed for moderate to severe pain, #30, 0 refills.  No other concerns voiced.  Instructed to call for any issues or concerns.

## 2012-07-18 ENCOUNTER — Ambulatory Visit: Payer: BC Managed Care – PPO | Attending: Gynecologic Oncology | Admitting: Gynecologic Oncology

## 2012-07-18 ENCOUNTER — Encounter: Payer: Self-pay | Admitting: Gynecologic Oncology

## 2012-07-18 VITALS — BP 108/74 | HR 68 | Temp 98.5°F | Resp 16 | Ht 65.0 in | Wt 285.6 lb

## 2012-07-18 DIAGNOSIS — C541 Malignant neoplasm of endometrium: Secondary | ICD-10-CM

## 2012-07-18 NOTE — Progress Notes (Signed)
Follow Up Note: Gyn-Onc  Kelli Allen 37 y.o. female  CC:  Chief Complaint  Patient presents with  . Endometrial cancer    Follow up    HPI:  Kelli Allen is a 37 year old, gravida 0 para 0, who was referred by Dr. Normand Sloop for endometrial cancer.  On 07/02/12, she underwent a total robotic hysterectomy, bilateral salpingo-oophorectomy with Dr. Duard Brady.  Final pathology revealed - ENDOMETRIOID CARCINOMA, FIGO GRADE I.  - BACKGROUND ATYPICAL COMPLEX HYPERPLASIA AND A ENDOMETRIAL POLYP. PLEASE SEE COMMENT. - BILATERAL OVARIES AND FALLOPIAN TUBES: NO EVIDENCE OF ATYPIA OR MALIGNANCY.  - CERVIX: BENIGN SQUAMOUS MUCOSA AND ENDOCERVICAL MUCOSA, NO DYSPLASIA OR MALIGNANCY.  - LEIOMYOMA with microsatellite stability.  Her post-operative course was uneventful.    Interval History:  She presents today for post-operative follow up.  She reports doing very well since surgery.  She describes the expected post operative status.  Adequate PO intake reported.  Bowels and bladder functioning without difficulty.  Pain minimal.  She plans to return to work at 6 weeks post-op but may call to go to work earlier as part-time.    Review of Systems  Constitutional: Feels well.  No fever, chills, weakness, or fatigue.  Cardiovascular: No chest pain, shortness of breath, or edema.  Pulmonary: No cough or wheeze.  Gastrointestinal: No nausea, vomiting, or diarrhea. No bright red blood per rectum or change in bowel movement.  No abdominal pain.  Genitourinary: No frequency, urgency, or dysuria. No vaginal bleeding or discharge.  Musculoskeletal: No myalgia or joint pain. Neurologic: No weakness, numbness, or change in gait.  Psychology: No depression, anxiety, or insomnia.  Current Meds:  Outpatient Encounter Prescriptions as of 07/18/2012  Medication Sig Dispense Refill  . atorvastatin (LIPITOR) 10 MG tablet Take 10 mg by mouth  every morning.       . Canagliflozin (INVOKANA) 300 MG TABS Take by mouth every morning.       . gabapentin (NEURONTIN) 100 MG capsule Take 300 mg by mouth at bedtime.       . insulin aspart (NOVOLOG) 100 UNIT/ML injection Inject 30 Units into the skin 3 (three) times daily with meals.      . insulin glargine (LANTUS) 100 UNIT/ML injection Inject 80 Units into the skin at bedtime.      Marland Kitchen labetalol (NORMODYNE) 200 MG tablet Take 200 mg by mouth 2 (two) times daily.      . Liraglutide (VICTOZA) 18 MG/3ML SOLN Inject 1.8 mg into the skin daily.       Marland Kitchen lisinopril-hydrochlorothiazide (PRINZIDE,ZESTORETIC) 20-12.5 MG per tablet Take 1 tablet by mouth at bedtime.       . metformin (FORTAMET) 500 MG (OSM) 24 hr tablet Take 1,000 mg by mouth 2 (two) times daily with a meal.       . NON FORMULARY Take 1 capsule by mouth 2 (two) times daily. nutralite omega 3      . NON FORMULARY Take 1 tablet by mouth 2 (two) times daily. nutralite clear now to clear up rosacia      . OVER THE COUNTER MEDICATION Take 1 packet by mouth 2 (two) times daily. Nutralite multi  Vitamins      . OVER THE COUNTER MEDICATION Take 3 tablets by mouth 2 (two) times daily. Tri-Iron  Folic Acid Vit C 30mg  Fe 10mg       . OVER THE COUNTER MEDICATION Take 1 tablet by mouth 2 (two) times daily. Glucose Health Cromium Piccolate      .  oxyCODONE-acetaminophen (PERCOCET/ROXICET) 5-325 MG per tablet Take 1-2 tablets by mouth every 6 (six) hours as needed (moderate to severe pain).  30 tablet  0   No facility-administered encounter medications on file as of 07/18/2012.    Allergy:  Allergies  Allergen Reactions  . Antibacterial Hand Soap (Triclosan)     Dry, itchy patches on skin    Social Hx:   History   Social History  . Marital Status: Married    Spouse Name: N/A    Number of Children: N/A  . Years of Education: N/A   Occupational History  . Not on file.   Social History Main Topics  . Smoking status: Never Smoker    . Smokeless tobacco: Never Used  . Alcohol Use: Yes     Comment: rare  . Drug Use: No  . Sexually Active: Yes    Birth Control/ Protection: IUD     Comment: mirena    Other Topics Concern  . Not on file   Social History Narrative  . No narrative on file    Past Surgical Hx:  Past Surgical History  Procedure Laterality Date  . Hysteroscopy w/d&c  01/24/2012    Procedure: DILATATION AND CURETTAGE /HYSTEROSCOPY;  Surgeon: Michael Litter, MD;  Location: [facility];  Service: Gynecology;  Laterality: N/A;  with vulvar biopsies   . Intrauterine device (iud) insertion  01/24/2012    Procedure: INTRAUTERINE DEVICE (IUD) INSERTION;  Surgeon: Michael Litter, MD;  Location: [facility];  Service: Gynecology;  Laterality: N/A;  . Dilation and curettage of uterus    . Robotic assisted total hysterectomy with bilateral salpingo oopherectomy Bilateral 07/02/2012    Procedure: ROBOTIC ASSISTED TOTAL HYSTERECTOMY WITH BILATERAL SALPINGO OOPHORECTOMY ;  Surgeon: Rejeana Brock A. Duard Brady, MD;  Location: [facility];  Service: Gynecology;  Laterality: Bilateral;    Past Medical Hx:  Past Medical History  Diagnosis Date  . Diabetes mellitus   . Cellulitis of lower leg     x3 on right leg  . Hypertension   . Irregular menstrual bleeding   . PCOS (polycystic ovarian syndrome)   . Tachycardia   . Abnormal Pap smear 2000  . Endometrial ca 12/20/2011  . Asthma     no issues in years  . Family history of anesthesia complication     sister had hard time waking up and problems breathing    Family Hx:  Family History  Problem Relation Age of Onset  . Diabetes type II Mother   . Coronary artery disease Mother   . Diabetes type II Father   . Coronary artery disease Father   . Heart disease Sister     Vitals:  Blood pressure 108/74, pulse 68, temperature 98.5 F (36.9 C), temperature source Oral, resp. rate 16, height 5\' 5"  (1.651 m), weight 285 lb 9.6 oz (129.547 kg), last menstrual period  01/17/2012.  Physical Exam: General: Well developed, well nourished female in no acute distress. Alert and oriented x 3.  Neck: Supple without any enlargements.  Lymph node survey: No cervical, supraclavicular, or inguinal adenopathy.  Cardiovascular: Regular rate and rhythm. S1 and S2 normal.  Lungs: Clear to auscultation bilaterally. No wheezes/crackles/rhonchi noted.  Skin: No rashes or lesions present. Back: No CVA tenderness.  Abdomen: Abdomen soft, non-tender and obese. Active bowel sounds in all quadrants. No evidence of a fluid wave or abdominal masses.  Lap sites well healed.  Exposed suture removed from right lateral lap site with difficulty.  No drainage or  erythema noted.  No evidence of herniation.  Extremities: No bilateral cyanosis or clubbing.  1+ non-pitting edema noted-not a new finding for patient.   Assessment/Plan:  37 year old s/p total robotic hysterectomy, bilateral salpingo-oophorectomy with Dr. Duard Brady on 07/02/12.  Final pathology revealed - ENDOMETRIOID CARCINOMA, FIGO GRADE I.  - BACKGROUND ATYPICAL COMPLEX HYPERPLASIA AND A ENDOMETRIAL POLYP. PLEASE SEE COMMENT. - BILATERAL OVARIES AND FALLOPIAN TUBES: NO EVIDENCE OF ATYPIA OR MALIGNANCY.  - CERVIX: BENIGN SQUAMOUS MUCOSA AND ENDOCERVICAL MUCOSA, NO DYSPLASIA OR MALIGNANCY.  - LEIOMYOMA with microsatellite stability.  She is doing well post-operatively.  She is advised to follow up with Dr. Duard Brady in August as scheduled for 8 week post-operative check.  She is advised to call for any questions or concerns.  Reportable signs and symptoms reviewed.  The patient was reviewed with Dr. Duard Brady.    Deidre Carino DEAL, NP 07/18/2012, 4:21 PM

## 2012-07-18 NOTE — Patient Instructions (Signed)
Doing well.  Plan to follow up as scheduled.  Please contact the office for any questions or concerns.

## 2012-07-26 ENCOUNTER — Encounter: Payer: Self-pay | Admitting: Gynecologic Oncology

## 2012-08-13 ENCOUNTER — Telehealth: Payer: Self-pay | Admitting: Gynecologic Oncology

## 2012-08-13 NOTE — Telephone Encounter (Signed)
Patient called with complaints of light vaginal spotting.  She reports picking up her family members small child last pm and noticed light vaginal spotting.  Instructed to monitor and call the office if the spotting persisted or increased over the next one to two days.  Reportable signs and symptoms reviewed.

## 2012-08-22 ENCOUNTER — Encounter: Payer: Self-pay | Admitting: Gynecologic Oncology

## 2012-08-22 ENCOUNTER — Ambulatory Visit: Payer: BC Managed Care – PPO | Attending: Gynecologic Oncology | Admitting: Gynecologic Oncology

## 2012-08-22 VITALS — BP 128/60 | HR 88 | Temp 97.5°F | Resp 18 | Ht 65.0 in | Wt 286.9 lb

## 2012-08-22 DIAGNOSIS — C549 Malignant neoplasm of corpus uteri, unspecified: Secondary | ICD-10-CM | POA: Insufficient documentation

## 2012-08-22 DIAGNOSIS — Z9079 Acquired absence of other genital organ(s): Secondary | ICD-10-CM | POA: Insufficient documentation

## 2012-08-22 DIAGNOSIS — C541 Malignant neoplasm of endometrium: Secondary | ICD-10-CM

## 2012-08-22 DIAGNOSIS — Z79899 Other long term (current) drug therapy: Secondary | ICD-10-CM | POA: Insufficient documentation

## 2012-08-22 DIAGNOSIS — E119 Type 2 diabetes mellitus without complications: Secondary | ICD-10-CM | POA: Insufficient documentation

## 2012-08-22 DIAGNOSIS — Z794 Long term (current) use of insulin: Secondary | ICD-10-CM | POA: Insufficient documentation

## 2012-08-22 DIAGNOSIS — Z90710 Acquired absence of both cervix and uterus: Secondary | ICD-10-CM | POA: Insufficient documentation

## 2012-08-22 DIAGNOSIS — I10 Essential (primary) hypertension: Secondary | ICD-10-CM | POA: Insufficient documentation

## 2012-08-22 NOTE — Progress Notes (Signed)
Consult Note: Gyn-Onc  Kelli Allen 37 y.o. female  CC:  Chief Complaint  Patient presents with  . Endometrial cancer    Follow up    HPI:  Patient is a 37 year old gravida 0 para 0 who remembers having irregular periods in which he passed some tissue in October of 2012. She was seen by Dr. Normand Sloop and apparently Dr. Normand Sloop wanted to perform a history of sonogram but the patient declined. She was seen in followup with Dr. Normand Sloop in September was started on birth control pills. In October or November she had some spotting and it was fairly regular for a few days. She ultimately underwent a hysteroscopy D&C that Dr. Normand Sloop and 1 he to perform and underwent an endometrial biopsy on November 7. [name] 45-year-old a polyp. The uterus measures 8.04 x 4.3 cm with normal appearing adnexa. There was a 1.2 cm polyp noted. She had an endometrial biopsy that revealed a grade 1 endometrial carcinoma arising atypical complex hyperplasia. The patient wished initially to retain her fertility and subsequent comes in today still see [place].  She underwent a D&C IUD placement with Dr. Normand Sloop on January 15. Pathology revealed an endometrioid adenocarcinoma grade 1 arising in a background of atypical complex hyperplasia. She also had some benign melanotic nevi removed at that time.  She got a second opinion with Dr. Huntley Dec who told her that the most definitive treatment would be hysterectomy. She comes in today for followup. She continues to struggle with the decision to undergo a hysterectomy. She comes accompanied by 2 friends today. She has brought down her A1c to 7.  Endometrial biopsy was performed April 2014 seconds initial complex atypical hyperplasia with her with a questionable small focus of grade 1 endometrial cancer. She opted for definitive surgery and on 07/02/2012 showed a total robotic hysterectomy bilateral salpingo-oophorectomy. Operative findings included significant  intra-abdominal and mesenteric adiposity. The uterus and adnexa otherwise were normal. Frozen section revealed a grade 1 lesion with minimal myometrial invasion.   Final pathology revealed: ADDITIONAL INFORMATION: The tumor block was sent to [facility] for MSI testing. IHC report: No loss of expression of MSH2, MSH6, PMS2 and MLH1 with appropriate control.  FINAL DIAGNOSIS Diagnosis Uterus +/- tubes/ovaries, neoplastic, with cervix - ENDOMETRIOID CARCINOMA, FIGO GRADE I. - BACKGROUND ATYPICAL COMPLEX HYPERPLASIA AND A ENDOMETRIAL POLYP. PLEASE SEE COMMENT. - BILATERAL OVARIES AND FALLOPIAN TUBES: NO EVIDENCE OF ATYPIA OR MALIGNANCY. - CERVIX: BENIGN SQUAMOUS MUCOSA AND ENDOCERVICAL MUCOSA, NO DYSPLASIA OR MALIGNANCY. - LEIOMYOMA. Microscopic Comment UTERUS Specimen: Uterus, bilateral ovaries and fallopian tubes. Procedure: Total hysterectomy and bilateral salpingo-oophorectomy Lymph node sampling performed: No. Specimen integrity: Intact. Maximum tumor size (cm): Multiple foci, largest one measures 0.8 cm. Histologic type: Endometrioid carcinoma Grade: FIGO grade I. Myometrial invasion: 0 cm where myometrium is 2.6 cm in thickness   Microscopic Comment(continued) Cervical stromal involvement: No. Extent of involvement of other organs: No. Lymph vascular invasion: Not identified. Peritoneal washings: N/A Lymph nodes: number examined N/A ; number positive - N/A TNM code: pT1a, pNX FIGO Stage (based on pathologic findings, needs clinical correlation): IA  Review of Systems: She comes in today for her postoperative check. She will do spotting. She's been back to work for 4 weeks. The first 3 weeks she worked half a day and this past week still working every day all day. Her stepchildren her been with her for the past month. She's had no pain no change in her bowel bladder habits. She does complain of  some external genitalia irritation.  That is a long-standing problem.  Current Meds:   Outpatient Encounter Prescriptions as of 08/22/2012  Medication Sig Dispense Refill  . atorvastatin (LIPITOR) 10 MG tablet Take 10 mg by mouth every morning.       . Canagliflozin (INVOKANA) 300 MG TABS Take by mouth every morning.       . gabapentin (NEURONTIN) 100 MG capsule Take 300 mg by mouth at bedtime.       . insulin aspart (NOVOLOG) 100 UNIT/ML injection Inject 30 Units into the skin 3 (three) times daily with meals.      . insulin glargine (LANTUS) 100 UNIT/ML injection Inject 80 Units into the skin at bedtime.      Marland Kitchen labetalol (NORMODYNE) 200 MG tablet Take 200 mg by mouth 2 (two) times daily.      . Liraglutide (VICTOZA) 18 MG/3ML SOLN Inject 1.8 mg into the skin daily.       Marland Kitchen lisinopril-hydrochlorothiazide (PRINZIDE,ZESTORETIC) 20-12.5 MG per tablet Take 1 tablet by mouth at bedtime.       . metformin (FORTAMET) 500 MG (OSM) 24 hr tablet Take 1,000 mg by mouth 2 (two) times daily with a meal.       . NON FORMULARY Take 1 capsule by mouth 2 (two) times daily. nutralite omega 3      . NON FORMULARY Take 1 tablet by mouth 2 (two) times daily. nutralite clear now to clear up rosacia      . OVER THE COUNTER MEDICATION Take 1 packet by mouth 2 (two) times daily. Nutralite multi  Vitamins      . OVER THE COUNTER MEDICATION Take 3 tablets by mouth 2 (two) times daily. Tri-Iron  Folic Acid Vit C 30mg  Fe 10mg       . OVER THE COUNTER MEDICATION Take 1 tablet by mouth 2 (two) times daily. Glucose Health Cromium Piccolate      . oxyCODONE-acetaminophen (PERCOCET/ROXICET) 5-325 MG per tablet Take 1-2 tablets by mouth every 6 (six) hours as needed (moderate to severe pain).  30 tablet  0   No facility-administered encounter medications on file as of 08/22/2012.    Allergy:  Allergies  Allergen Reactions  . Antibacterial Hand Soap [Triclosan]     Dry, itchy patches on skin    Social Hx:  She denies tobacco. She drinks alcohol socially she has a drug use. Her husband is with an  Facilities manager but she is married. She works in Clinical biochemist for [employer].  History   Social History  . Marital Status: Married    Spouse Name: N/A    Number of Children: N/A  . Years of Education: N/A   Occupational History  . Not on file.   Social History Main Topics  . Smoking status: Never Smoker   . Smokeless tobacco: Never Used  . Alcohol Use: Yes     Comment: rare  . Drug Use: No  . Sexual Activity: Yes    Birth Control/ Protection: IUD     Comment: mirena    Other Topics Concern  . Not on file   Social History Narrative  . No narrative on file    Past Surgical Hx:  Past Surgical History  Procedure Laterality Date  . Hysteroscopy w/d&c  01/24/2012    Procedure: DILATATION AND CURETTAGE /HYSTEROSCOPY;  Surgeon: Michael Litter, MD;  Location: [facility];  Service: Gynecology;  Laterality: N/A;  with vulvar biopsies   . Intrauterine device (iud) insertion  01/24/2012    Procedure: INTRAUTERINE DEVICE (IUD) INSERTION;  Surgeon: Michael Litter, MD;  Location: [facility];  Service: Gynecology;  Laterality: N/A;  . Dilation and curettage of uterus    . Robotic assisted total hysterectomy with bilateral salpingo oopherectomy Bilateral 07/02/2012    Procedure: ROBOTIC ASSISTED TOTAL HYSTERECTOMY WITH BILATERAL SALPINGO OOPHORECTOMY ;  Surgeon: Rejeana Brock A. Duard Brady, MD;  Location: [facility];  Service: Gynecology;  Laterality: Bilateral;    Past Medical Hx:  Past Medical History  Diagnosis Date  . Diabetes mellitus   . Cellulitis of lower leg     x3 on right leg  . Hypertension   . Irregular menstrual bleeding   . PCOS (polycystic ovarian syndrome)   . Tachycardia   . Abnormal Pap smear 2000  . Endometrial ca 12/20/2011  . Asthma     no issues in years  . Family history of anesthesia complication     sister had hard time waking up and problems breathing    Family Hx: She is one of 7 girls. Although 2 of her sisters have depression. She has one sister with  mitochondrial myopathy. Her paternal grandfather died of lung cancer she was a smoker. Maternal grandmother may have had bone "cancer" Family History  Problem Relation Age of Onset  . Diabetes type II Mother   . Coronary artery disease Mother   . Diabetes type II Father   . Coronary artery disease Father   . Heart disease Sister     Vitals:  Blood pressure 128/60, pulse 88, temperature 97.5 F (36.4 C), temperature source Oral, resp. rate 18, height 5\' 5"  (1.651 m), weight 286 lb 14.4 oz (130.137 kg), last menstrual period 01/17/2012.  Physical Exam:  Well-nourished well-developed female in no acute distress.   Abdomen: Morbidly obese. Well-healed surgical incisions. Abdomen is soft and nontender.  Pelvic: External genitalia notable shows minimal erythema along the labia majora and the midline. There's no visible lesions or discharge. Vaginal cuff is visualized is intact is healing well. Bimanual examination reveals no masses nodularity or fluctuance. However, exam is limited by habitus.   Assessment/Plan: 37 year old gravida 0 with a grade 1 endometrial carcinoma as well as an endometrial polyp on sonohysterogram. She initially wanted to preserve fertility and underwent a D&C and IUD placement. Repeat biopsy showed persistent disease and she underwent definitive treatment June 24. She's doing very well postoperatively. She released from her postoperative restrictions. Return to see me in 6 months. After that we'll begin alternating appointments and visits with Dr. Eduardo Osier A., MD 08/22/2012, 12:25 PM

## 2012-08-22 NOTE — Patient Instructions (Signed)
Hysterectomy °Care After °Refer to this sheet in the next few weeks. These instructions provide you with information on caring for yourself after your procedure. Your caregiver may also give you more specific instructions. Your treatment has been planned according to current medical practices, but problems sometimes occur. Call your caregiver if you have any problems or questions after your procedure. °HOME CARE INSTRUCTIONS  °Healing will take time. You may have discomfort, tenderness, swelling, and bruising at the surgical site for about 2 weeks. This is normal and will get better as time goes on. °· Only take over-the-counter or prescription medicines for pain, discomfort, or fever as directed by your caregiver. °· Do not take aspirin. It can cause bleeding. °· Do not drive when taking pain medicine. °· Follow your caregiver's advice regarding exercise, lifting, driving, and general activities. °· Resume your usual diet as directed and allowed. °· Get plenty of rest and sleep. °· Do not douche, use tampons, or have sexual intercourse for at least 6 weeks or until your caregiver gives you permission. °· Change your bandages (dressings) as directed by your caregiver. °· Monitor your temperature. °· Take showers instead of baths for 2 to 3 weeks. °· Do not drink alcohol until your caregiver gives you permission. °· If you are constipated, you may take a mild laxative with your caregiver's permission. Bran foods may help with constipation problems. Drinking enough fluids to keep your urine clear or pale yellow may help as well. °· Try to have someone home with you for 1 or 2 weeks to help around the house. °· Keep all of your follow-up appointments as directed by your caregiver. °SEEK MEDICAL CARE IF:  °· You have swelling, redness, or increasing pain in the surgical cut (incision) area. °· You have pus coming from the incision. °· You notice a bad smell coming from the incision or dressing. °· You have swelling,  redness, or pain around the intravenous (IV) site. °· Your incision breaks open. °· You feel dizzy or lightheaded. °· You have pain or bleeding when you urinate. °· You have persistent diarrhea. °· You have persistent nausea and vomiting. °· You have abnormal vaginal discharge. °· You have a rash. °· You have any type of abnormal reaction or develop an allergy to your medicine. °· Your pain is not controlled with your prescribed medicine. °SEEK IMMEDIATE MEDICAL CARE IF:  °· You have a fever. °· You have severe abdominal pain. °· You have chest pain. °· You have shortness of breath. °· You faint. °· You have pain, swelling, or redness of your leg. °· You have heavy vaginal bleeding with blood clots. °MAKE SURE YOU: °· Understand these instructions. °· Will watch your condition. °· Will get help right away if you are not doing well or get worse. °Document Released: 07/15/2004 Document Revised: 03/20/2011 Document Reviewed: 08/12/2010 °Exit[employer]® Patient Information ©2014 [employer]. ° °

## 2012-08-28 ENCOUNTER — Ambulatory Visit: Payer: BC Managed Care – PPO | Admitting: Gynecologic Oncology

## 2012-10-24 ENCOUNTER — Emergency Department (HOSPITAL_BASED_OUTPATIENT_CLINIC_OR_DEPARTMENT_OTHER)
Admission: EM | Admit: 2012-10-24 | Discharge: 2012-10-24 | Disposition: A | Discharge status: Home / Self Care | Payer: BC Managed Care – PPO | Attending: Emergency Medicine | Admitting: Emergency Medicine

## 2012-10-24 ENCOUNTER — Encounter (HOSPITAL_BASED_OUTPATIENT_CLINIC_OR_DEPARTMENT_OTHER): Payer: Self-pay | Admitting: Emergency Medicine

## 2012-10-24 DIAGNOSIS — Z8542 Personal history of malignant neoplasm of other parts of uterus: Secondary | ICD-10-CM | POA: Insufficient documentation

## 2012-10-24 DIAGNOSIS — L02419 Cutaneous abscess of limb, unspecified: Secondary | ICD-10-CM | POA: Insufficient documentation

## 2012-10-24 DIAGNOSIS — E119 Type 2 diabetes mellitus without complications: Secondary | ICD-10-CM | POA: Insufficient documentation

## 2012-10-24 DIAGNOSIS — Z794 Long term (current) use of insulin: Secondary | ICD-10-CM | POA: Insufficient documentation

## 2012-10-24 DIAGNOSIS — Z79899 Other long term (current) drug therapy: Secondary | ICD-10-CM | POA: Insufficient documentation

## 2012-10-24 DIAGNOSIS — L0390 Cellulitis, unspecified: Secondary | ICD-10-CM

## 2012-10-24 DIAGNOSIS — IMO0001 Reserved for inherently not codable concepts without codable children: Secondary | ICD-10-CM | POA: Insufficient documentation

## 2012-10-24 DIAGNOSIS — E282 Polycystic ovarian syndrome: Secondary | ICD-10-CM | POA: Insufficient documentation

## 2012-10-24 DIAGNOSIS — I10 Essential (primary) hypertension: Secondary | ICD-10-CM | POA: Insufficient documentation

## 2012-10-24 DIAGNOSIS — J45909 Unspecified asthma, uncomplicated: Secondary | ICD-10-CM | POA: Insufficient documentation

## 2012-10-24 LAB — CBC WITH DIFFERENTIAL/PLATELET
Basophils Absolute: 0.1 10*3/uL (ref 0.0–0.1)
Eosinophils Relative: 2 % (ref 0–5)
Lymphocytes Relative: 17 % (ref 12–46)
Neutro Abs: 8.5 10*3/uL — ABNORMAL HIGH (ref 1.7–7.7)
Platelets: 269 10*3/uL (ref 150–400)
RDW: 13.5 % (ref 11.5–15.5)
WBC: 11.7 10*3/uL — ABNORMAL HIGH (ref 4.0–10.5)

## 2012-10-24 LAB — BASIC METABOLIC PANEL
CO2: 27 mEq/L (ref 19–32)
Calcium: 10.5 mg/dL (ref 8.4–10.5)
Chloride: 101 mEq/L (ref 96–112)
GFR calc Af Amer: >90 mL/min (ref >90)
Sodium: 139 mEq/L (ref 135–145)

## 2012-10-24 MED ORDER — CLINDAMYCIN HCL 150 MG PO CAPS: 300.0000 mg | ORAL_CAPSULE | Freq: Four times a day (QID) | ORAL | Status: DC

## 2012-10-24 MED ORDER — CLINDAMYCIN PHOSPHATE 900 MG/50ML IV SOLN
900.0000 mg | Freq: Once | INTRAVENOUS | Status: AC
  Administered 2012-10-24: 900 mg via INTRAVENOUS
  Filled 2012-10-24: qty 50

## 2012-10-24 NOTE — ED Notes (Signed)
Pt reports redness on right lower leg and states she thinks she has cellulitis.

## 2012-10-24 NOTE — ED Provider Notes (Signed)
CSN: [id number]     Arrival date & time 10/24/12  1258 History   First MD Initiated Contact with Patient 10/24/12 1315     Chief Complaint  Patient presents with  . Rash   (Consider location/radiation/quality/duration/timing/severity/associated sxs/prior Treatment) HPI Comments: Patient presents to the ER for evaluation of cellulitis. Patient reports that she has had multiple episodes of cellulitis on the leg in the past. She had onset of flulike myalgias this morning followed by outbreak of erythematous rash on the right ankle. She says this is the area that the cellulitis always starts on and symptoms currently are identical. She has not documented any fevers. Pain in the area is mild.  Patient is a 37 y.o. female presenting with rash.  Rash Associated symptoms: myalgias     Past Medical History  Diagnosis Date  . Diabetes mellitus   . Cellulitis of lower leg     x3 on right leg  . Hypertension   . Irregular menstrual bleeding   . PCOS (polycystic ovarian syndrome)   . Tachycardia   . Abnormal Pap smear 2000  . Endometrial ca 12/20/2011  . Asthma     no issues in years  . Family history of anesthesia complication     sister had hard time waking up and problems breathing   Past Surgical History  Procedure Laterality Date  . Hysteroscopy w/d&c  01/24/2012    Procedure: DILATATION AND CURETTAGE /HYSTEROSCOPY;  Surgeon: Michael Litter, MD;  Location: [facility];  Service: Gynecology;  Laterality: N/A;  with vulvar biopsies   . Intrauterine device (iud) insertion  01/24/2012    Procedure: INTRAUTERINE DEVICE (IUD) INSERTION;  Surgeon: Michael Litter, MD;  Location: [facility];  Service: Gynecology;  Laterality: N/A;  . Dilation and curettage of uterus    . Robotic assisted total hysterectomy with bilateral salpingo oopherectomy Bilateral 07/02/2012    Procedure: ROBOTIC ASSISTED TOTAL HYSTERECTOMY WITH BILATERAL SALPINGO OOPHORECTOMY ;  Surgeon: Rejeana Brock A. Duard Brady, MD;  Location: [facility];   Service: Gynecology;  Laterality: Bilateral;   Family History  Problem Relation Age of Onset  . Diabetes type II Mother   . Coronary artery disease Mother   . Diabetes type II Father   . Coronary artery disease Father   . Heart disease Sister    History  Substance Use Topics  . Smoking status: Never Smoker   . Smokeless tobacco: Never Used  . Alcohol Use: Yes     Comment: rare   OB History   Grav Para Term Preterm Abortions TAB SAB Ect Mult Living   0              Review of Systems  Musculoskeletal: Positive for myalgias.  Skin: Positive for rash.  All other systems reviewed and are negative.    Allergies  Antibacterial hand soap  Home Medications   Current Outpatient Rx  Name  Route  Sig  Dispense  Refill  . atorvastatin (LIPITOR) 10 MG tablet   Oral   Take 10 mg by mouth every morning.          . Canagliflozin (INVOKANA) 300 MG TABS   Oral   Take by mouth every morning.          . gabapentin (NEURONTIN) 100 MG capsule   Oral   Take 300 mg by mouth at bedtime.          . insulin aspart (NOVOLOG) 100 UNIT/ML injection   Subcutaneous   Inject 30 Units  into the skin 3 (three) times daily with meals.         . insulin glargine (LANTUS) 100 UNIT/ML injection   Subcutaneous   Inject 80 Units into the skin at bedtime.         Marland Kitchen labetalol (NORMODYNE) 200 MG tablet   Oral   Take 200 mg by mouth 2 (two) times daily.         . Liraglutide (VICTOZA) 18 MG/3ML SOLN   Subcutaneous   Inject 1.8 mg into the skin daily.          Marland Kitchen lisinopril-hydrochlorothiazide (PRINZIDE,ZESTORETIC) 20-12.5 MG per tablet   Oral   Take 1 tablet by mouth at bedtime.          . metformin (FORTAMET) 500 MG (OSM) 24 hr tablet   Oral   Take 1,000 mg by mouth 2 (two) times daily with a meal.          . NON FORMULARY   Oral   Take 1 capsule by mouth 2 (two) times daily. nutralite omega 3         . NON FORMULARY   Oral   Take 1 tablet by mouth 2 (two) times  daily. nutralite clear now to clear up rosacia         . OVER THE COUNTER MEDICATION   Oral   Take 1 packet by mouth 2 (two) times daily. Nutralite multi  Vitamins         . OVER THE COUNTER MEDICATION   Oral   Take 3 tablets by mouth 2 (two) times daily. Tri-Iron  Folic Acid Vit C 30mg  Fe 10mg          . OVER THE COUNTER MEDICATION   Oral   Take 1 tablet by mouth 2 (two) times daily. Glucose Health Cromium Piccolate          BP 145/104  Pulse 108  Temp(Src) 98.4 F (36.9 C) (Oral)  Resp 18  Ht 5\' 5"  (1.651 m)  Wt 289 lb (131.09 kg)  BMI 48.09 kg/m2  SpO2 98%  LMP 01/17/2012 Physical Exam  Constitutional: She is oriented to person, place, and time. She appears well-developed and well-nourished. No distress.  HENT:  Head: Normocephalic and atraumatic.  Right Ear: Hearing normal.  Left Ear: Hearing normal.  Nose: Nose normal.  Mouth/Throat: Oropharynx is clear and moist and mucous membranes are normal.  Eyes: Conjunctivae and EOM are normal. Pupils are equal, round, and reactive to light.  Neck: Normal range of motion. Neck supple.  Cardiovascular: Regular rhythm, S1 normal and S2 normal.  Exam reveals no gallop and no friction rub.   No murmur heard. Pulmonary/Chest: Effort normal and breath sounds normal. No respiratory distress. She exhibits no tenderness.  Abdominal: Soft. Normal appearance and bowel sounds are normal. There is no hepatosplenomegaly. There is no tenderness. There is no rebound, no guarding, no tenderness at McBurney's point and negative Murphy's sign. No hernia.  Musculoskeletal: Normal range of motion.  Neurological: She is alert and oriented to person, place, and time. She has normal strength. No cranial nerve deficit or sensory deficit. Coordination normal. GCS eye subscore is 4. GCS verbal subscore is 5. GCS motor subscore is 6.  Skin: Skin is warm, dry and intact. No rash noted. No cyanosis.     Psychiatric: She has a normal mood  and affect. Her speech is normal and behavior is normal. Thought content normal.    ED Course  Procedures (including critical  care time) Labs Review Labs Reviewed  CBC WITH DIFFERENTIAL  BASIC METABOLIC PANEL   Imaging Review No results found.  EKG Interpretation   None       MDM  Diagnosis: Cellulitis  Patient presents with what appears to be very early cellulitis of the leg. She is a very small area of erythema which just came out this morning. She is afebrile. Patient has had multiple episodes of this in the past. It is reasonable to initiate the patient on antibiotic coverage and watch closely. She is very familiar with the cellulitis process and will return immediately if her symptoms worsen.    Gilda Crease, MD 10/24/12 [phone]

## 2012-10-24 NOTE — ED Notes (Signed)
Waiting for IV anitibiotics to infuse

## 2012-10-25 ENCOUNTER — Emergency Department (HOSPITAL_BASED_OUTPATIENT_CLINIC_OR_DEPARTMENT_OTHER)
Admission: EM | Admit: 2012-10-25 | Discharge: 2012-10-25 | Disposition: A | Discharge status: Home / Self Care | Payer: BC Managed Care – PPO | Attending: Emergency Medicine | Admitting: Emergency Medicine

## 2012-10-25 ENCOUNTER — Encounter (HOSPITAL_BASED_OUTPATIENT_CLINIC_OR_DEPARTMENT_OTHER): Payer: Self-pay | Admitting: Emergency Medicine

## 2012-10-25 DIAGNOSIS — R42 Dizziness and giddiness: Secondary | ICD-10-CM | POA: Insufficient documentation

## 2012-10-25 DIAGNOSIS — E119 Type 2 diabetes mellitus without complications: Secondary | ICD-10-CM | POA: Insufficient documentation

## 2012-10-25 DIAGNOSIS — L02419 Cutaneous abscess of limb, unspecified: Secondary | ICD-10-CM | POA: Insufficient documentation

## 2012-10-25 DIAGNOSIS — R21 Rash and other nonspecific skin eruption: Secondary | ICD-10-CM | POA: Insufficient documentation

## 2012-10-25 DIAGNOSIS — J45909 Unspecified asthma, uncomplicated: Secondary | ICD-10-CM | POA: Insufficient documentation

## 2012-10-25 DIAGNOSIS — R5381 Other malaise: Secondary | ICD-10-CM | POA: Insufficient documentation

## 2012-10-25 DIAGNOSIS — Z79899 Other long term (current) drug therapy: Secondary | ICD-10-CM | POA: Insufficient documentation

## 2012-10-25 DIAGNOSIS — Z8542 Personal history of malignant neoplasm of other parts of uterus: Secondary | ICD-10-CM | POA: Insufficient documentation

## 2012-10-25 DIAGNOSIS — L0390 Cellulitis, unspecified: Secondary | ICD-10-CM

## 2012-10-25 DIAGNOSIS — I10 Essential (primary) hypertension: Secondary | ICD-10-CM | POA: Insufficient documentation

## 2012-10-25 DIAGNOSIS — Z794 Long term (current) use of insulin: Secondary | ICD-10-CM | POA: Insufficient documentation

## 2012-10-25 DIAGNOSIS — Z792 Long term (current) use of antibiotics: Secondary | ICD-10-CM | POA: Insufficient documentation

## 2012-10-25 DIAGNOSIS — Z8742 Personal history of other diseases of the female genital tract: Secondary | ICD-10-CM | POA: Insufficient documentation

## 2012-10-25 MED ORDER — VANCOMYCIN HCL IN DEXTROSE 1-5 GM/200ML-% IV SOLN
1000.0000 mg | Freq: Once | INTRAVENOUS | Status: AC
  Administered 2012-10-25: 1000 mg via INTRAVENOUS
  Filled 2012-10-25: qty 200

## 2012-10-25 NOTE — ED Notes (Signed)
Pt reports that cellulitis site is worse, more red, larger, and now itching.

## 2012-10-25 NOTE — ED Notes (Signed)
Recheck of cellulitis on right ankle per DC orders from yesterday.

## 2012-10-25 NOTE — ED Provider Notes (Signed)
CSN: [id number]     Arrival date & time 10/25/12  1454 History   First MD Initiated Contact with Patient 10/25/12 1528     Chief Complaint  Patient presents with  . Follow-up   (Consider location/radiation/quality/duration/timing/severity/associated sxs/prior Treatment) HPI Comments: Patient presents with a recheck of a cellulitis in her right ankle. She was seen here yesterday for the same and started on clindamycin. She is an insulin-dependent diabetic and has had multiple episodes of cellulitis to her right lower leg in the past. She's had to be hospitalized in the past IV antibiotics. She states that the area is slightly more painful than it was yesterday and there is a very slight extension of the redness. She denies any streaking up her leg. She denies he fevers. She denies any chills. She denies he nausea or vomiting. She has been taking the clindamycin. Her tetanus shot is up-to-date.   Past Medical History  Diagnosis Date  . Diabetes mellitus   . Cellulitis of lower leg     x3 on right leg  . Hypertension   . Irregular menstrual bleeding   . PCOS (polycystic ovarian syndrome)   . Tachycardia   . Abnormal Pap smear 2000  . Endometrial ca 12/20/2011  . Asthma     no issues in years  . Family history of anesthesia complication     sister had hard time waking up and problems breathing   Past Surgical History  Procedure Laterality Date  . Hysteroscopy w/d&c  01/24/2012    Procedure: DILATATION AND CURETTAGE /HYSTEROSCOPY;  Surgeon: Michael Litter, MD;  Location: [facility];  Service: Gynecology;  Laterality: N/A;  with vulvar biopsies   . Intrauterine device (iud) insertion  01/24/2012    Procedure: INTRAUTERINE DEVICE (IUD) INSERTION;  Surgeon: Michael Litter, MD;  Location: [facility];  Service: Gynecology;  Laterality: N/A;  . Dilation and curettage of uterus    . Robotic assisted total hysterectomy with bilateral salpingo oopherectomy Bilateral 07/02/2012    Procedure: ROBOTIC  ASSISTED TOTAL HYSTERECTOMY WITH BILATERAL SALPINGO OOPHORECTOMY ;  Surgeon: Rejeana Brock A. Duard Brady, MD;  Location: [facility];  Service: Gynecology;  Laterality: Bilateral;   Family History  Problem Relation Age of Onset  . Diabetes type II Mother   . Coronary artery disease Mother   . Diabetes type II Father   . Coronary artery disease Father   . Heart disease Sister    History  Substance Use Topics  . Smoking status: Never Smoker   . Smokeless tobacco: Never Used  . Alcohol Use: Yes     Comment: rare   OB History   Grav Para Term Preterm Abortions TAB SAB Ect Mult Living   0              Review of Systems  Constitutional: Positive for fatigue. Negative for fever, chills and diaphoresis.  HENT: Negative for congestion, rhinorrhea and sneezing.   Eyes: Negative.   Respiratory: Negative for cough, chest tightness and shortness of breath.   Cardiovascular: Negative for chest pain and leg swelling.  Gastrointestinal: Negative for nausea, vomiting, abdominal pain, diarrhea and blood in stool.  Genitourinary: Negative for frequency, hematuria, flank pain and difficulty urinating.  Musculoskeletal: Negative for arthralgias and back pain.  Skin: Positive for rash.  Neurological: Positive for light-headedness. Negative for dizziness, speech difficulty, weakness, numbness and headaches.    Allergies  Antibacterial hand soap  Home Medications   Current Outpatient Rx  Name  Route  Sig  Dispense  Refill  . clindamycin (CLEOCIN) 150 MG capsule   Oral   Take 2 capsules (300 mg total) by mouth every 6 (six) hours.   80 capsule   0   . atorvastatin (LIPITOR) 10 MG tablet   Oral   Take 10 mg by mouth every morning.          . Canagliflozin (INVOKANA) 300 MG TABS   Oral   Take by mouth every morning.          . gabapentin (NEURONTIN) 100 MG capsule   Oral   Take 300 mg by mouth at bedtime.          . insulin aspart (NOVOLOG) 100 UNIT/ML injection   Subcutaneous   Inject 30  Units into the skin 3 (three) times daily with meals.         . insulin glargine (LANTUS) 100 UNIT/ML injection   Subcutaneous   Inject 80 Units into the skin at bedtime.         Marland Kitchen labetalol (NORMODYNE) 200 MG tablet   Oral   Take 200 mg by mouth 2 (two) times daily.         . Liraglutide (VICTOZA) 18 MG/3ML SOLN   Subcutaneous   Inject 1.8 mg into the skin daily.          Marland Kitchen lisinopril-hydrochlorothiazide (PRINZIDE,ZESTORETIC) 20-12.5 MG per tablet   Oral   Take 1 tablet by mouth at bedtime.          . metformin (FORTAMET) 500 MG (OSM) 24 hr tablet   Oral   Take 1,000 mg by mouth 2 (two) times daily with a meal.          . NON FORMULARY   Oral   Take 1 capsule by mouth 2 (two) times daily. nutralite omega 3         . NON FORMULARY   Oral   Take 1 tablet by mouth 2 (two) times daily. nutralite clear now to clear up rosacia         . OVER THE COUNTER MEDICATION   Oral   Take 1 packet by mouth 2 (two) times daily. Nutralite multi  Vitamins         . OVER THE COUNTER MEDICATION   Oral   Take 3 tablets by mouth 2 (two) times daily. Tri-Iron  Folic Acid Vit C 30mg  Fe 10mg          . OVER THE COUNTER MEDICATION   Oral   Take 1 tablet by mouth 2 (two) times daily. Glucose Health Cromium Piccolate          BP 124/62  Pulse 103  Temp(Src) 97.8 F (36.6 C) (Oral)  Resp 16  Ht 5\' 5"  (1.651 m)  Wt 289 lb (131.09 kg)  BMI 48.09 kg/m2  SpO2 97%  LMP 01/17/2012 Physical Exam  Constitutional: She is oriented to person, place, and time. She appears well-developed and well-nourished.  HENT:  Head: Normocephalic and atraumatic.  Eyes: Pupils are equal, round, and reactive to light.  Neck: Normal range of motion. Neck supple.  Cardiovascular: Normal rate, regular rhythm and normal heart sounds.   Pulmonary/Chest: Effort normal and breath sounds normal. No respiratory distress. She has no wheezes. She has no rales. She exhibits no tenderness.   Abdominal: Soft. Bowel sounds are normal. There is no tenderness. There is no rebound and no guarding.  Musculoskeletal: Normal range of motion. She exhibits no edema.  Lymphadenopathy:  She has no cervical adenopathy.  Neurological: She is alert and oriented to person, place, and time.  Skin: Skin is warm and dry. No rash noted.  There is a 3-4 cm area of erythema over the medial aspect of the right lower leg. I really don't see any extension past the marked area from yesterday.. There is a little bit of the redness abutting the marked line and patient states that this is a little bit more progressed than yesterday. There is no underlying induration or fluctuance. There is no streaking up the leg.  Psychiatric: She has a normal mood and affect.    ED Course  Procedures (including critical care time) Labs Review Labs Reviewed - No data to display Imaging Review No results found.  EKG Interpretation   None       MDM   1. Cellulitis     Patient has a small area of cellulitis to the right lower leg. He don't see any significant development from yesterday. She states it's a little bit worse than yesterday. Given this I did give her one dose of IV vancomycin and. I certainly do not feel that she currently needs hospitalization for ongoing IV antibiotics. There is no signs of underlying abscess. She doesn't have any systemic signs of illness or symptoms suggestive of a more significant underlying soft tissue infection. She is very familiar with the cellulitis and I feel a she's very appropriate to go home and return if she has any worsening symptoms. I advised her if she has ongoing improvement to continue antibiotics at home and to arrange follow with her primary care physician. Otherwise return here if she has any progression of the cellulitis.    Rolan Bucco, MD 10/25/12 [phone]

## 2013-02-25 ENCOUNTER — Telehealth: Payer: Self-pay | Admitting: *Deleted

## 2013-02-25 NOTE — Telephone Encounter (Signed)
Call to pt, lmovm pt has appt on 2/18.

## 2013-02-26 ENCOUNTER — Ambulatory Visit: Payer: BC Managed Care – PPO | Attending: Gynecologic Oncology | Admitting: Gynecologic Oncology

## 2013-02-26 ENCOUNTER — Encounter: Payer: Self-pay | Admitting: Gynecologic Oncology

## 2013-02-26 VITALS — BP 150/82 | HR 96 | Temp 98.2°F | Resp 18 | Ht 64.57 in | Wt 288.3 lb

## 2013-02-26 DIAGNOSIS — C541 Malignant neoplasm of endometrium: Secondary | ICD-10-CM

## 2013-02-26 DIAGNOSIS — B373 Candidiasis of vulva and vagina: Secondary | ICD-10-CM

## 2013-02-26 DIAGNOSIS — B3731 Acute candidiasis of vulva and vagina: Secondary | ICD-10-CM

## 2013-02-26 MED ORDER — FLUCONAZOLE 100 MG PO TABS: 100.0000 mg | ORAL_TABLET | Freq: Once | ORAL | Status: DC

## 2013-02-26 NOTE — Patient Instructions (Signed)
Doing well.  Plan to follow up in six months, August 2015, with a pap smear at that time or sooner if problems arise.  Please call in June or July to schedule an appointment for August 2015.  Please contact the office for any development of vaginal bleeding, abdominal pain, etc.

## 2013-02-28 ENCOUNTER — Encounter: Payer: Self-pay | Admitting: Gynecologic Oncology

## 2013-02-28 NOTE — Progress Notes (Addendum)
Follow Up Note: Gyn-Onc  Kelli Allen 38 y.o. female  CC:  Chief Complaint  Patient presents with  . Endometrial Cancer    HPI:  Kelli Allen is a 38 year old, gravida 0 para 0, who was referred by Dr. Charlesetta Garibaldi for endometrial cancer.  She reported having irregular periods in which she passed some tissue in October of 2012. She was seen by Dr. Charlesetta Garibaldi and apparently the patient declined testing recommended by Dr. Charlesetta Garibaldi. She was seen in followup with Dr. Charlesetta Garibaldi in September and was started on birth control pills. In October or November, she had some spotting and it was fairly regular for a few days. She ultimately underwent a hysteroscopy D&C by Dr. Charlesetta Garibaldi and underwent an endometrial biopsy on November 7.  The uterus measures 8.04 x 4.3 cm with normal appearing adnexa. There was a 1.2 cm polyp noted. She had an endometrial biopsy that revealed a grade 1 endometrial carcinoma arising atypical complex hyperplasia. The patient wished initially to retain her fertility and saw Dr. Alycia Rossetti initially on 12/20/11.   She underwent a D&C, IUD placement with Dr. Charlesetta Garibaldi on January 15. Pathology revealed an endometrioid adenocarcinoma grade 1 arising in a background of atypical complex hyperplasia. She also had some benign melanotic nevi removed at that time. She got a second opinion with Dr. Gertie Fey who told her that the most definitive treatment would be hysterectomy.  Endometrial biopsy was performed April 2014 resulting COMPLEX ATYPICAL HYPERPLASIA WITH MICROSCOPIC FOCI SUSPICIOUS FOR RESIDUAL WELL DIFFERENTIATED ADENOCARCINOMA.  She opted for definitive surgery and on 07/02/2012, she underwent a total robotic hysterectomy bilateral salpingo-oophorectomy. Operative findings included significant intra-abdominal and mesenteric adiposity. The uterus and adnexa otherwise were normal. Frozen section revealed a grade 1 lesion with minimal myometrial invasion.  Final pathology revealed:  ADDITIONAL INFORMATION:   The tumor block was sent to [facility] for MSI testing.  [facility] report: No loss of expression of MSH2, MSH6, PMS2 and MLH1 with appropriate control.  FINAL DIAGNOSIS  Diagnosis  Uterus +/- tubes/ovaries, neoplastic, with cervix  - ENDOMETRIOID CARCINOMA, FIGO GRADE I.  - BACKGROUND ATYPICAL COMPLEX HYPERPLASIA AND A ENDOMETRIAL POLYP. PLEASE SEE COMMENT.  - BILATERAL OVARIES AND FALLOPIAN TUBES: NO EVIDENCE OF ATYPIA OR MALIGNANCY.  - CERVIX: BENIGN SQUAMOUS MUCOSA AND ENDOCERVICAL MUCOSA, NO DYSPLASIA OR MALIGNANCY.  - LEIOMYOMA.  Microscopic Comment  UTERUS  Specimen: Uterus, bilateral ovaries and fallopian tubes. Procedure: Total hysterectomy and bilateral salpingo-oophorectomy.  Lymph node sampling performed: No. Specimen integrity: Intact. Maximum tumor size (cm): Multiple foci, largest one measures 0.8 cm. Histologic type: Endometrioid carcinoma.  Grade: FIGO grade I. Myometrial invasion: 0 cm where myometrium is 2.6 cm in thickness  Microscopic Comment(continued):  Cervical stromal involvement: No. Extent of involvement of other organs: No. Lymph vascular invasion: Not identified. Peritoneal washings: N/A.  Lymph nodes: number examined N/A ; number positive - N/A. TNM code: pT1a, pNX.  FIGO Stage (based on pathologic findings, needs clinical correlation): IA  Interval History:  She presents today for follow up.  She reports doing very well since her last visit.  She has been working and has plans for her step children to spend the summer with her and her husband.  Bowels and bladder functioning without difficulty.  She denies vaginal bleeding.  During review of systems, she reports "I know it is not going to come back."  She states she has tried to lose weight but when she begins exercising, etc her weight increases, which discourages her.  She has been seen  her endocrinologist for her increasing Hgb A1C, currently 7.7 from 7.4, but reports no change in her diet.  She will be seeing a new PCP  since her previous provider left the practice.  It has been over a year since having a physician breast examination and she voices concerns about an abrasion on her nipple that she would like to have assessed.  She states the area develops a scab and she picks it off.  She also feels that she may have a yeast infection but she is not sure how she could have one since she has had a hysterectomy.  She reports minimal external genitalia irritation as well.  No other concerns voiced.   Review of Systems  Constitutional: Feels well.  No fever, chills, weakness, change in appetite, unintentional weight loss or gain, or fatigue.  Cardiovascular: No chest pain, shortness of breath, or edema.  Pulmonary: No cough or wheeze.  Gastrointestinal: No nausea, vomiting, or diarrhea. No bright red blood per rectum or change in bowel movement.  No abdominal pain.  Genitourinary: No frequency, urgency, or dysuria. No vaginal bleeding.  Positive for white discharge at times that causes itching.  Musculoskeletal: No myalgia or joint pain. Neurologic: No weakness, numbness, or change in gait.  Psychology: No depression, anxiety, or insomnia.  Current Meds:  Outpatient Encounter Prescriptions as of 02/26/2013  Medication Sig  . atorvastatin (LIPITOR) 10 MG tablet Take 10 mg by mouth every morning.   . Canagliflozin (INVOKANA) 300 MG TABS Take by mouth every morning.   . clindamycin (CLEOCIN) 150 MG capsule Take 2 capsules (300 mg total) by mouth every 6 (six) hours.  . gabapentin (NEURONTIN) 100 MG capsule Take 300 mg by mouth at bedtime.   . insulin aspart (NOVOLOG) 100 UNIT/ML injection Inject 30 Units into the skin 3 (three) times daily with meals.  . insulin glargine (LANTUS) 100 UNIT/ML injection Inject 80 Units into the skin at bedtime.  Marland Kitchen labetalol (NORMODYNE) 200 MG tablet Take 200 mg by mouth 2 (two) times daily.  . Liraglutide (VICTOZA) 18 MG/3ML SOLN Inject 1.8 mg into the skin daily.   Marland Kitchen  lisinopril-hydrochlorothiazide (PRINZIDE,ZESTORETIC) 20-12.5 MG per tablet Take 1 tablet by mouth at bedtime.   . metformin (FORTAMET) 500 MG (OSM) 24 hr tablet Take 1,000 mg by mouth 2 (two) times daily with a meal.   . NON FORMULARY Take 1 capsule by mouth 2 (two) times daily. nutralite omega 3  . NON FORMULARY Take 1 tablet by mouth 2 (two) times daily. nutralite clear now to clear up rosacia  . ONE TOUCH ULTRA TEST test strip   . OVER THE COUNTER MEDICATION Take 1 packet by mouth 2 (two) times daily. Nutralite multi  Vitamins  . OVER THE COUNTER MEDICATION Take 3 tablets by mouth 2 (two) times daily. Tri-Iron  Folic Acid 734KAJ Vit C 71m Fe 123m . OVER THE COUNTER MEDICATION Take 1 tablet by mouth 2 (two) times daily. Glucose Health Cromium Piccolate  . PROAIR HFA 108 (90 BASE) MCG/ACT inhaler   . triamcinolone cream (KENALOG) 0.1 %   . fluconazole (DIFLUCAN) 100 MG tablet Take 1 tablet (100 mg total) by mouth once.  . [DISCONTINUED] azithromycin (ZITHROMAX) 250 MG tablet     Allergy:  Allergies  Allergen Reactions  . Antibacterial Hand Soap [Triclosan]     Dry, itchy patches on skin    Social Hx:   History   Social History  . Marital Status: Married    Spouse  Name: N/A    Number of Children: N/A  . Years of Education: N/A   Occupational History  . Not on file.   Social History Main Topics  . Smoking status: Never Smoker   . Smokeless tobacco: Never Used  . Alcohol Use: Yes     Comment: rare  . Drug Use: No  . Sexual Activity: Yes    Birth Control/ Protection: IUD     Comment: mirena    Other Topics Concern  . Not on file   Social History Narrative  . No narrative on file    Past Surgical Hx:  Past Surgical History  Procedure Laterality Date  . Hysteroscopy w/d&c  01/24/2012    Procedure: DILATATION AND CURETTAGE /HYSTEROSCOPY;  Surgeon: Betsy Coder, MD;  Location: [facility];  Service: Gynecology;  Laterality: N/A;  with vulvar biopsies   .  Intrauterine device (iud) insertion  01/24/2012    Procedure: INTRAUTERINE DEVICE (IUD) INSERTION;  Surgeon: Betsy Coder, MD;  Location: [facility];  Service: Gynecology;  Laterality: N/A;  . Dilation and curettage of uterus    . Robotic assisted total hysterectomy with bilateral salpingo oopherectomy Bilateral 07/02/2012    Procedure: ROBOTIC ASSISTED TOTAL HYSTERECTOMY WITH BILATERAL SALPINGO OOPHORECTOMY ;  Surgeon: Imagene Gurney A. Alycia Rossetti, MD;  Location: [facility];  Service: Gynecology;  Laterality: Bilateral;    Past Medical Hx:  Past Medical History  Diagnosis Date  . Diabetes mellitus   . Cellulitis of lower leg     x3 on right leg  . Hypertension   . Irregular menstrual bleeding   . PCOS (polycystic ovarian syndrome)   . Tachycardia   . Abnormal Pap smear 2000  . Endometrial ca 12/20/2011  . Asthma     no issues in years  . Family history of anesthesia complication     sister had hard time waking up and problems breathing    Family Hx:  Family History  Problem Relation Age of Onset  . Diabetes type II Mother   . Coronary artery disease Mother   . Diabetes type II Father   . Coronary artery disease Father   . Heart disease Sister     Vitals:  Blood pressure 150/82, pulse 96, temperature 98.2 F (36.8 C), temperature source Oral, resp. rate 18, height 5' 4.57" (1.64 m), weight 288 lb 4.8 oz (130.772 kg), last menstrual period 01/17/2012.  Physical Exam: General: Well developed, well nourished female in no acute distress. Alert and oriented x 3.  Head/Neck: Oropharynx clear.  Sclerae anicteric.  Supple without any enlargements.  Lymph node survey: No cervical, supraclavicular, or inguinal adenopathy.  Cardiovascular: Regular rate and rhythm. S1 and S2 normal.  Lungs: Clear to auscultation bilaterally. No wheezes/crackles/rhonchi noted.  Skin: No rashes or lesions present. Back: No CVA tenderness. Breasts: Inspection negative with no nodularity, masses, erythema, or discharge  noted bilaterally.  Healing abrasion to the right nipple at 3:00pm.  No signs of infection.  Advised to let the area heal and avoid removing the scab. Abdomen: Abdomen soft, non-tender and obese. Active bowel sounds in all quadrants. No evidence of a fluid wave or abdominal masses but limited due to habitus.  Lap sites well healed with no evidence of herniation.   Genitourinary (exam limited due to habitus):    Vulva/vagina: Mild erythema noted along the labia majora bilaterally.  No lesions or discharge.    Urethra: No lesions or masses.    Vagina: Vaginal cuff well healed, no lesions. No  palpable masses. No vaginal bleeding or drainage noted.  Rectal: Good tone, no masses, no cul de sac nodularity.  Extremities: No bilateral cyanosis or clubbing.  1+ non-pitting edema noted-not a new finding for patient.   Assessment/Plan:  38 year old with grade 1 endometrial carcinoma.  She initially wanted to preserve her fertility and opted for a D&C with IUD placement.  On repeat biopsy, persistent disease was still identified.  She underwent definitive treatment consisting of a total robotic hysterectomy, bilateral salpingo-oophorectomy with Dr. Alycia Rossetti on 07/02/12.  Final pathology revealed - ENDOMETRIOID CARCINOMA, FIGO GRADE I.  - BACKGROUND ATYPICAL COMPLEX HYPERPLASIA AND A ENDOMETRIAL POLYP. PLEASE SEE COMMENT. - BILATERAL OVARIES AND FALLOPIAN TUBES: NO EVIDENCE OF ATYPIA OR MALIGNANCY.  - CERVIX: BENIGN SQUAMOUS MUCOSA AND ENDOCERVICAL MUCOSA, NO DYSPLASIA OR MALIGNANCY.  - LEIOMYOMA with microsatellite stability.  She is advised to follow up in August 2015 with a pap smear at that time or sooner if needed.  She is given a refill on diflucan per her request and states that she has monistat external cream at home.  She is advised to call for any questions or concerns.  Encouraged to continue her weight loss efforts even if her weight increases in the beginning.  Reportable signs and symptoms  reviewed.   CROSS, MELISSA DEAL, NP 02/28/2013, 3:32 PM

## 2013-06-06 ENCOUNTER — Ambulatory Visit: Payer: BC Managed Care – PPO | Attending: Gynecology | Admitting: Gynecology

## 2013-06-06 ENCOUNTER — Encounter: Payer: Self-pay | Admitting: Gynecology

## 2013-06-06 VITALS — BP 147/75 | HR 92 | Temp 97.2°F | Resp 19 | Ht 65.0 in | Wt 293.4 lb

## 2013-06-06 DIAGNOSIS — I10 Essential (primary) hypertension: Secondary | ICD-10-CM | POA: Insufficient documentation

## 2013-06-06 DIAGNOSIS — E119 Type 2 diabetes mellitus without complications: Secondary | ICD-10-CM | POA: Insufficient documentation

## 2013-06-06 DIAGNOSIS — N898 Other specified noninflammatory disorders of vagina: Secondary | ICD-10-CM | POA: Insufficient documentation

## 2013-06-06 DIAGNOSIS — Z794 Long term (current) use of insulin: Secondary | ICD-10-CM | POA: Insufficient documentation

## 2013-06-06 DIAGNOSIS — C549 Malignant neoplasm of corpus uteri, unspecified: Secondary | ICD-10-CM | POA: Insufficient documentation

## 2013-06-06 DIAGNOSIS — C541 Malignant neoplasm of endometrium: Secondary | ICD-10-CM

## 2013-06-06 DIAGNOSIS — N939 Abnormal uterine and vaginal bleeding, unspecified: Secondary | ICD-10-CM

## 2013-06-06 DIAGNOSIS — Z79899 Other long term (current) drug therapy: Secondary | ICD-10-CM | POA: Insufficient documentation

## 2013-06-06 NOTE — Patient Instructions (Signed)
Follow up with Dr. Gehrig in 6 months. 

## 2013-06-06 NOTE — Progress Notes (Signed)
Consult Note: Gyn-Onc   Kelli Allen 38 y.o. female  Chief Complaint  Patient presents with  . lEndo ca    Follow up     Assessment : Recent history of brief episode of vaginal bleeding. I see no source on examination today.  Plan: The patient will return in 6 months for routine followup. She'll contact us if she has any further bleeding.  Interval History: The patient presents today because of recent onset of vaginal bleeding. This occurred shortly after having sexual relations. The bleeding stopped within 12 hours. She has not had any pain or any other pelvic symptoms.  Otherwise the patient's been her usual self.  HPI: Stage IA grade 1 endometrial carcinoma undergoing robotic assisted hysterectomy bilateral salpingo-oophorectomy on 07/02/2012. Patient an uncomplicated postoperative course. No adjuvant therapy was recommended.  Review of Systems:10 point review of systems is negative except as noted in interval history.   Vitals: Blood pressure 147/75, pulse 92, temperature 97.2 F (36.2 C), temperature source Oral, resp. rate 19, height 5\' 5"  (1.651 m), weight 293 lb 6.4 oz (133.085 kg), last menstrual period 01/17/2012.  Physical Exam: General : The patient is a healthy woman in no acute distress.  HEENT: normocephalic, extraoccular movements normal; neck is supple without thyromegally  Lynphnodes: Supraclavicular and inguinal nodes not enlarged  Abdomen: Soft, non-tender, no ascites, no organomegally, no masses, no hernias  Pelvic:  EGBUS: Normal female no evidence of trauma or bleeding site. Vagina: Normal, no lesions , there is no bleeding. The vagina is carefully inspected. The cuff is well healed. Urethra and Bladder: Normal, non-tender  Cervix: Surgically absent  Uterus: Surgically absent  Bi-manual examination: Non-tender; no adenxal masses or nodularity  Rectal: normal sphincter tone, no masses, no blood  Lower extremities: No edema or varicosities. Normal range  of motion      Allergies  Allergen Reactions  . Antibacterial Hand Soap [Triclosan]     Dry, itchy patches on skin    Past Medical History  Diagnosis Date  . Diabetes mellitus   . Cellulitis of lower leg     x3 on right leg  . Hypertension   . Irregular menstrual bleeding   . PCOS (polycystic ovarian syndrome)   . Tachycardia   . Abnormal Pap smear 2000  . Endometrial ca 12/20/2011  . Asthma     no issues in years  . Family history of anesthesia complication     sister had hard time waking up and problems breathing    Past Surgical History  Procedure Laterality Date  . Hysteroscopy w/d&c  01/24/2012    Procedure: DILATATION AND CURETTAGE /HYSTEROSCOPY;  Surgeon: Betsy Coder, MD;  Location: [facility];  Service: Gynecology;  Laterality: N/A;  with vulvar biopsies   . Intrauterine device (iud) insertion  01/24/2012    Procedure: INTRAUTERINE DEVICE (IUD) INSERTION;  Surgeon: Betsy Coder, MD;  Location: [facility];  Service: Gynecology;  Laterality: N/A;  . Dilation and curettage of uterus    . Robotic assisted total hysterectomy with bilateral salpingo oopherectomy Bilateral 07/02/2012    Procedure: ROBOTIC ASSISTED TOTAL HYSTERECTOMY WITH BILATERAL SALPINGO OOPHORECTOMY ;  Surgeon: Imagene Gurney A. Alycia Rossetti, MD;  Location: [facility];  Service: Gynecology;  Laterality: Bilateral;    Current Outpatient Prescriptions  Medication Sig Dispense Refill  . atorvastatin (LIPITOR) 10 MG tablet Take 10 mg by mouth every morning.       . Canagliflozin (INVOKANA) 300 MG TABS Take by mouth every morning.       Marland Kitchen  clindamycin (CLEOCIN) 150 MG capsule Take 2 capsules (300 mg total) by mouth every 6 (six) hours.  80 capsule  0  . fluconazole (DIFLUCAN) 100 MG tablet Take 1 tablet (100 mg total) by mouth once.  1 tablet  2  . gabapentin (NEURONTIN) 100 MG capsule Take 300 mg by mouth at bedtime.       . insulin aspart (NOVOLOG) 100 UNIT/ML injection Inject 30 Units into the skin 3 (three) times daily with  meals.      . insulin glargine (LANTUS) 100 UNIT/ML injection Inject 80 Units into the skin at bedtime.      Marland Kitchen labetalol (NORMODYNE) 200 MG tablet Take 200 mg by mouth 2 (two) times daily.      . Liraglutide (VICTOZA) 18 MG/3ML SOLN Inject 1.8 mg into the skin daily.       Marland Kitchen lisinopril-hydrochlorothiazide (PRINZIDE,ZESTORETIC) 20-12.5 MG per tablet Take 1 tablet by mouth at bedtime.       . metformin (FORTAMET) 500 MG (OSM) 24 hr tablet Take 1,000 mg by mouth 2 (two) times daily with a meal.       . NON FORMULARY Take 1 capsule by mouth 2 (two) times daily. nutralite omega 3      . NON FORMULARY Take 1 tablet by mouth 2 (two) times daily. nutralite clear now to clear up rosacia      . ONE TOUCH ULTRA TEST test strip       . OVER THE COUNTER MEDICATION Take 1 packet by mouth 2 (two) times daily. Nutralite multi  Vitamins      . OVER THE COUNTER MEDICATION Take 3 tablets by mouth 2 (two) times daily. Tri-Iron  Folic Acid [id number] Vit C 30mg  Fe 10mg       . OVER THE COUNTER MEDICATION Take 1 tablet by mouth 2 (two) times daily. Glucose Health Cromium Piccolate      . PROAIR HFA 108 (90 BASE) MCG/ACT inhaler       . triamcinolone cream (KENALOG) 0.1 %        No current facility-administered medications for this visit.    History   Social History  . Marital Status: Married    Spouse Name: N/A    Number of Children: N/A  . Years of Education: N/A   Occupational History  . Not on file.   Social History Main Topics  . Smoking status: Never Smoker   . Smokeless tobacco: Never Used  . Alcohol Use: Yes     Comment: rare  . Drug Use: No  . Sexual Activity: Yes    Birth Control/ Protection: IUD     Comment: mirena    Other Topics Concern  . Not on file   Social History Narrative  . No narrative on file    Family History  Problem Relation Age of Onset  . Diabetes type II Mother   . Coronary artery disease Mother   . Diabetes type II Father   . Coronary artery disease Father   .  Heart disease Sister       Alvino Chapel, MD 06/06/2013, 1:41 PM

## 2013-08-21 ENCOUNTER — Emergency Department (HOSPITAL_BASED_OUTPATIENT_CLINIC_OR_DEPARTMENT_OTHER): Payer: BC Managed Care – PPO

## 2013-08-21 ENCOUNTER — Inpatient Hospital Stay (HOSPITAL_BASED_OUTPATIENT_CLINIC_OR_DEPARTMENT_OTHER)
Admission: EM | Admit: 2013-08-21 | Discharge: 2013-08-26 | DRG: 871 | Disposition: A | Discharge status: Home / Self Care | Payer: BC Managed Care – PPO | Attending: Internal Medicine | Admitting: Internal Medicine

## 2013-08-21 ENCOUNTER — Encounter (HOSPITAL_BASED_OUTPATIENT_CLINIC_OR_DEPARTMENT_OTHER): Payer: Self-pay | Admitting: Emergency Medicine

## 2013-08-21 DIAGNOSIS — A419 Sepsis, unspecified organism: Principal | ICD-10-CM | POA: Diagnosis present

## 2013-08-21 DIAGNOSIS — S5010XA Contusion of unspecified forearm, initial encounter: Secondary | ICD-10-CM | POA: Diagnosis present

## 2013-08-21 DIAGNOSIS — W540XXA Bitten by dog, initial encounter: Secondary | ICD-10-CM | POA: Diagnosis present

## 2013-08-21 DIAGNOSIS — L03116 Cellulitis of left lower limb: Secondary | ICD-10-CM

## 2013-08-21 DIAGNOSIS — Z8249 Family history of ischemic heart disease and other diseases of the circulatory system: Secondary | ICD-10-CM | POA: Diagnosis not present

## 2013-08-21 DIAGNOSIS — IMO0001 Reserved for inherently not codable concepts without codable children: Secondary | ICD-10-CM | POA: Diagnosis present

## 2013-08-21 DIAGNOSIS — E282 Polycystic ovarian syndrome: Secondary | ICD-10-CM | POA: Diagnosis present

## 2013-08-21 DIAGNOSIS — E872 Acidosis, unspecified: Secondary | ICD-10-CM | POA: Diagnosis present

## 2013-08-21 DIAGNOSIS — Z794 Long term (current) use of insulin: Secondary | ICD-10-CM | POA: Diagnosis not present

## 2013-08-21 DIAGNOSIS — D72829 Elevated white blood cell count, unspecified: Secondary | ICD-10-CM | POA: Diagnosis present

## 2013-08-21 DIAGNOSIS — Z8542 Personal history of malignant neoplasm of other parts of uterus: Secondary | ICD-10-CM | POA: Diagnosis not present

## 2013-08-21 DIAGNOSIS — Z23 Encounter for immunization: Secondary | ICD-10-CM

## 2013-08-21 DIAGNOSIS — I890 Lymphedema, not elsewhere classified: Secondary | ICD-10-CM | POA: Diagnosis present

## 2013-08-21 DIAGNOSIS — J45909 Unspecified asthma, uncomplicated: Secondary | ICD-10-CM | POA: Diagnosis present

## 2013-08-21 DIAGNOSIS — L02419 Cutaneous abscess of limb, unspecified: Secondary | ICD-10-CM | POA: Diagnosis present

## 2013-08-21 DIAGNOSIS — E1165 Type 2 diabetes mellitus with hyperglycemia: Secondary | ICD-10-CM | POA: Diagnosis present

## 2013-08-21 DIAGNOSIS — L03119 Cellulitis of unspecified part of limb: Secondary | ICD-10-CM

## 2013-08-21 DIAGNOSIS — E739 Lactose intolerance, unspecified: Secondary | ICD-10-CM

## 2013-08-21 DIAGNOSIS — IMO0002 Reserved for concepts with insufficient information to code with codable children: Secondary | ICD-10-CM | POA: Diagnosis present

## 2013-08-21 DIAGNOSIS — Z833 Family history of diabetes mellitus: Secondary | ICD-10-CM

## 2013-08-21 DIAGNOSIS — Z9109 Other allergy status, other than to drugs and biological substances: Secondary | ICD-10-CM

## 2013-08-21 DIAGNOSIS — A46 Erysipelas: Secondary | ICD-10-CM | POA: Diagnosis present

## 2013-08-21 DIAGNOSIS — L03115 Cellulitis of right lower limb: Secondary | ICD-10-CM

## 2013-08-21 DIAGNOSIS — Z6841 Body Mass Index (BMI) 40.0 and over, adult: Secondary | ICD-10-CM | POA: Diagnosis not present

## 2013-08-21 DIAGNOSIS — E782 Mixed hyperlipidemia: Secondary | ICD-10-CM

## 2013-08-21 DIAGNOSIS — I10 Essential (primary) hypertension: Secondary | ICD-10-CM | POA: Diagnosis present

## 2013-08-21 DIAGNOSIS — M7989 Other specified soft tissue disorders: Secondary | ICD-10-CM | POA: Diagnosis present

## 2013-08-21 DIAGNOSIS — S51851D Open bite of right forearm, subsequent encounter: Secondary | ICD-10-CM

## 2013-08-21 DIAGNOSIS — E669 Obesity, unspecified: Secondary | ICD-10-CM

## 2013-08-21 DIAGNOSIS — W540XXD Bitten by dog, subsequent encounter: Secondary | ICD-10-CM

## 2013-08-21 DIAGNOSIS — R6520 Severe sepsis without septic shock: Secondary | ICD-10-CM

## 2013-08-21 DIAGNOSIS — S51851A Open bite of right forearm, initial encounter: Secondary | ICD-10-CM

## 2013-08-21 DIAGNOSIS — R6521 Severe sepsis with septic shock: Secondary | ICD-10-CM

## 2013-08-21 DIAGNOSIS — E1169 Type 2 diabetes mellitus with other specified complication: Secondary | ICD-10-CM

## 2013-08-21 DIAGNOSIS — C541 Malignant neoplasm of endometrium: Secondary | ICD-10-CM

## 2013-08-21 LAB — BASIC METABOLIC PANEL
Anion gap: 15 (ref 5–15)
BUN: 22 mg/dL (ref 6–23)
CALCIUM: 10.1 mg/dL (ref 8.4–10.5)
CO2: 24 meq/L (ref 19–32)
CREATININE: 0.8 mg/dL (ref 0.50–1.10)
Chloride: 101 mEq/L (ref 96–112)
GFR calc Af Amer: >90 mL/min (ref >90)
GLUCOSE: 293 mg/dL — AB (ref 70–99)
Potassium: 4 mEq/L (ref 3.7–5.3)
Sodium: 140 mEq/L (ref 137–147)

## 2013-08-21 LAB — URINE MICROSCOPIC-ADD ON

## 2013-08-21 LAB — CBC
HCT: 42.7 % (ref 36.0–46.0)
HEMATOCRIT: 41.3 % (ref 36.0–46.0)
HEMOGLOBIN: 14.5 g/dL (ref 12.0–15.0)
Hemoglobin: 13.1 g/dL (ref 12.0–15.0)
MCH: 34.5 pg — AB (ref 26.0–34.0)
MCH: 32.5 pg (ref 26.0–34.0)
MCHC: 34 g/dL (ref 30.0–36.0)
MCHC: 31.7 g/dL (ref 30.0–36.0)
MCV: 101.7 fL — AB (ref 78.0–100.0)
MCV: 102.5 fL — ABNORMAL HIGH (ref 78.0–100.0)
PLATELETS: 256 10*3/uL (ref 150–400)
Platelets: 236 10*3/uL (ref 150–400)
RBC: 4.2 MIL/uL (ref 3.87–5.11)
RBC: 4.03 MIL/uL (ref 3.87–5.11)
RDW: 13.7 % (ref 11.5–15.5)
RDW: 13.9 % (ref 11.5–15.5)
WBC: 17.1 10*3/uL — ABNORMAL HIGH (ref 4.0–10.5)
WBC: 20 10*3/uL — AB (ref 4.0–10.5)

## 2013-08-21 LAB — I-STAT CG4 LACTIC ACID, ED: LACTIC ACID, VENOUS: 4.16 mmol/L — AB (ref 0.5–2.2)

## 2013-08-21 LAB — CBG MONITORING, ED: Glucose-Capillary: 242 mg/dL — ABNORMAL HIGH (ref 70–99)

## 2013-08-21 LAB — LACTIC ACID, PLASMA: LACTIC ACID, VENOUS: 1.7 mmol/L (ref 0.5–2.2)

## 2013-08-21 LAB — GLUCOSE, CAPILLARY: Glucose-Capillary: 246 mg/dL — ABNORMAL HIGH (ref 70–99)

## 2013-08-21 LAB — URINALYSIS, ROUTINE W REFLEX MICROSCOPIC
BILIRUBIN URINE: NEGATIVE
HGB URINE DIPSTICK: NEGATIVE
KETONES UR: NEGATIVE mg/dL
Leukocytes, UA: NEGATIVE
Nitrite: NEGATIVE
PROTEIN: NEGATIVE mg/dL
Specific Gravity, Urine: 1.036 — ABNORMAL HIGH (ref 1.005–1.030)
Urobilinogen, UA: 0.2 mg/dL (ref 0.0–1.0)
pH: 6 (ref 5.0–8.0)

## 2013-08-21 LAB — CREATININE, SERUM
Creatinine, Ser: 0.86 mg/dL (ref 0.50–1.10)
GFR calc non Af Amer: 84 mL/min — ABNORMAL LOW (ref >90)

## 2013-08-21 LAB — PROCALCITONIN: Procalcitonin: 11.26 ng/mL

## 2013-08-21 LAB — MRSA PCR SCREENING: MRSA by PCR: NEGATIVE

## 2013-08-21 MED ORDER — SODIUM CHLORIDE 0.9 % IV SOLN
250.0000 mL | INTRAVENOUS | Status: DC | PRN
  Administered 2013-08-26: 250 mL via INTRAVENOUS

## 2013-08-21 MED ORDER — SODIUM CHLORIDE 0.9 % IV BOLUS (SEPSIS)
1000.0000 mL | Freq: Once | INTRAVENOUS | Status: AC
  Administered 2013-08-21: 1000 mL via INTRAVENOUS

## 2013-08-21 MED ORDER — INSULIN ASPART 100 UNIT/ML ~~LOC~~ SOLN: 0.0000 [IU] | Freq: Every day | SUBCUTANEOUS | Status: DC

## 2013-08-21 MED ORDER — ALBUTEROL SULFATE (2.5 MG/3ML) 0.083% IN NEBU: 2.5000 mg | INHALATION_SOLUTION | RESPIRATORY_TRACT | Status: DC | PRN

## 2013-08-21 MED ORDER — INSULIN ASPART 100 UNIT/ML ~~LOC~~ SOLN: 0.0000 [IU] | Freq: Three times a day (TID) | SUBCUTANEOUS | Status: DC

## 2013-08-21 MED ORDER — ACETAMINOPHEN 325 MG PO TABS
650.0000 mg | ORAL_TABLET | ORAL | Status: DC | PRN
  Administered 2013-08-21 – 2013-08-24 (×7): 650 mg via ORAL
  Filled 2013-08-21 (×7): qty 2

## 2013-08-21 MED ORDER — VANCOMYCIN HCL 10 G IV SOLR
1250.0000 mg | Freq: Two times a day (BID) | INTRAVENOUS | Status: DC
  Administered 2013-08-22 – 2013-08-24 (×5): 1250 mg via INTRAVENOUS
  Filled 2013-08-21 (×8): qty 1250

## 2013-08-21 MED ORDER — MORPHINE SULFATE 4 MG/ML IJ SOLN
6.0000 mg | Freq: Once | INTRAMUSCULAR | Status: DC
  Filled 2013-08-21: qty 2

## 2013-08-21 MED ORDER — TETANUS-DIPHTH-ACELL PERTUSSIS 5-2.5-18.5 LF-MCG/0.5 IM SUSP
0.5000 mL | Freq: Once | INTRAMUSCULAR | Status: DC
  Filled 2013-08-21: qty 0.5

## 2013-08-21 MED ORDER — VANCOMYCIN HCL 10 G IV SOLR
2000.0000 mg | Freq: Once | INTRAVENOUS | Status: DC
  Filled 2013-08-21: qty 2000

## 2013-08-21 MED ORDER — PIPERACILLIN-TAZOBACTAM 3.375 G IVPB
3.3750 g | Freq: Three times a day (TID) | INTRAVENOUS | Status: DC
  Administered 2013-08-22 – 2013-08-23 (×4): 3.375 g via INTRAVENOUS
  Filled 2013-08-21 (×7): qty 50

## 2013-08-21 MED ORDER — SODIUM CHLORIDE 0.9 % IV SOLN
INTRAVENOUS | Status: DC
  Administered 2013-08-21 – 2013-08-22 (×2): via INTRAVENOUS

## 2013-08-21 MED ORDER — INSULIN ASPART 100 UNIT/ML ~~LOC~~ SOLN
0.0000 [IU] | SUBCUTANEOUS | Status: DC
Start: 2013-08-21 — End: 2013-08-24
  Administered 2013-08-21: 7 [IU] via SUBCUTANEOUS
  Administered 2013-08-22 (×2): 4 [IU] via SUBCUTANEOUS
  Administered 2013-08-22: 7 [IU] via SUBCUTANEOUS
  Administered 2013-08-22 (×2): 4 [IU] via SUBCUTANEOUS
  Administered 2013-08-23: 3 [IU] via SUBCUTANEOUS
  Administered 2013-08-23: 7 [IU] via SUBCUTANEOUS
  Administered 2013-08-23: 3 [IU] via SUBCUTANEOUS
  Administered 2013-08-23 – 2013-08-24 (×4): 4 [IU] via SUBCUTANEOUS
  Administered 2013-08-24: 3 [IU] via SUBCUTANEOUS
  Administered 2013-08-24: 7 [IU] via SUBCUTANEOUS

## 2013-08-21 MED ORDER — ALBUTEROL SULFATE (2.5 MG/3ML) 0.083% IN NEBU
2.5000 mg | INHALATION_SOLUTION | Freq: Once | RESPIRATORY_TRACT | Status: AC
  Administered 2013-08-21: 2.5 mg via RESPIRATORY_TRACT
  Filled 2013-08-21: qty 3

## 2013-08-21 MED ORDER — HEPARIN SODIUM (PORCINE) 5000 UNIT/ML IJ SOLN
5000.0000 [IU] | Freq: Three times a day (TID) | INTRAMUSCULAR | Status: DC
  Administered 2013-08-21 – 2013-08-26 (×15): 5000 [IU] via SUBCUTANEOUS
  Filled 2013-08-21 (×20): qty 1

## 2013-08-21 MED ORDER — VANCOMYCIN HCL 500 MG IV SOLR
INTRAVENOUS | Status: AC
  Administered 2013-08-21: 500 mg
  Filled 2013-08-21: qty 2000

## 2013-08-21 MED ORDER — PIPERACILLIN-TAZOBACTAM 3.375 G IVPB 30 MIN
3.3750 g | Freq: Once | INTRAVENOUS | Status: AC
  Administered 2013-08-21: 3.375 g via INTRAVENOUS
  Filled 2013-08-21 (×2): qty 50

## 2013-08-21 NOTE — ED Notes (Signed)
Pt HR 117, BP 87/49. Dr. Mingo Amber notified. NS 1L Bolus infusing. Morphine not given at this time due to BP.

## 2013-08-21 NOTE — Progress Notes (Signed)
ANTIBIOTIC CONSULT NOTE - INITIAL  Pharmacy Consult:  Vancomycin / Zosyn Indication:  Sepsis  Allergies  Allergen Reactions  . Antibacterial Hand Soap [Triclosan]     Dry, itchy patches on skin    Patient Measurements: Height: 5' 4.5" (163.8 cm) Weight: 287 lb (130.182 kg) IBW/kg (Calculated) : 55.85  Vital Signs: Temp: 99.2 F (37.3 C) (08/13 1843) Temp src: Oral (08/13 1843) BP: 86/48 mmHg (08/13 2000) Pulse Rate: 111 (08/13 2000)  Labs:  Recent Labs  08/21/13 1650  WBC 17.1*  HGB 14.5  PLT 256  CREATININE 0.80   Estimated Creatinine Clearance: 127.6 ml/min (by C-G formula based on Cr of 0.8). No results found for this basename: VANCOTROUGH, VANCOPEAK, VANCORANDOM, GENTTROUGH, GENTPEAK, GENTRANDOM, TOBRATROUGH, TOBRAPEAK, TOBRARND, AMIKACINPEAK, AMIKACINTROU, AMIKACIN,  in the last 72 hours   Microbiology: No results found for this or any previous visit (from the past 720 hour(s)).  Medical History: Past Medical History  Diagnosis Date  . Diabetes mellitus   . Cellulitis of lower leg     x3 on right leg  . Hypertension   . Irregular menstrual bleeding   . PCOS (polycystic ovarian syndrome)   . Tachycardia   . Abnormal Pap smear 2000  . Endometrial ca 12/20/2011  . Asthma     no issues in years  . Family history of anesthesia complication     sister had hard time waking up and problems breathing      Assessment: 72 YOF presented to [facility] ED with recurrent right leg cellulitis, found to have severe sepsis and transferred to [facility].  Patient received vancomycin 2gm IV and Zosyn 3.375gm IV prior to transfer and to continue here.  Baseline labs reviewed.   Goal of Therapy:  Vancomycin trough level 15-20 mcg/ml   Plan:  - Vanc 1250mg  IV Q12H - Zosyn 3.375gm IV Q8H, 4 hr infusion, start tomorrow - Monitor renal fxn, clinical course, vanc trough at [facility]    Gibson Lad D. Mina Marble, PharmD, BCPS Pager:  [phone] 08/21/2013, 9:32 PM

## 2013-08-21 NOTE — ED Notes (Signed)
[facility] pharmacist contacted by charge nurse Sunday Spillers and dose 2,000mg  Vancomycin in 562mL NS via peripheral IV was OK'ed and rate was given as 2 1/2 hours to 3 hours. Vancoymycin will be initiated at 141mL/hr.

## 2013-08-21 NOTE — ED Notes (Signed)
Not given.  Will need to get at [facility] please

## 2013-08-21 NOTE — ED Notes (Signed)
Reports redness to right leg. Reports hx of cellulitis. Reports pain in groin as well

## 2013-08-21 NOTE — H&P (Signed)
PULMONARY / CRITICAL CARE MEDICINE   Name: Kelli Allen MRN: [id number] DOB: [birth year]    ADMISSION DATE:  08/21/2013 CONSULTATION DATE:  08/21/2013  REFERRING MD :  EDP  CHIEF COMPLAINT:  Recurrent cellulitis of right leg  INITIAL PRESENTATION: 38 y.o. F with hx of cellulitis in right leg brought to [facility] ED on 8/13 with recurrent cellulitis on same leg.  In ED, found to have severe sepsis.  PCCM was consulted for transfer to [facility].  STUDIES:  LE Duplex 8/13 >>> No DVT.  SIGNIFICANT EVENTS: 8/13 - presented to [facility] with redness and swelling of right leg and severe sepsis.   HISTORY OF PRESENT ILLNESS:  Kelli Allen is a 38 y.o. F with PMH as outlined below, which includes a history of recurrent cellulitis of the right lower extremity.  She presented to [facility] on 8/13 with redness and swelling of the right lower extremity again.  Symptoms began earlier in the day  Reported that this was the same presentation from last episode of cellulitis (August 2014, treated with Clindamycin at that time).  She also has hx of lymphedema. Of note, she reports that she was bitten by a puppy at the pet store 1 day ago.  Bite is on right hand, non-tender and asymptomatic. She denies fevers/chills/sweats, chest pain, SOB, N/V/D, abdominal pain. In ED, found to have severe sepsis (BP 86/48, WBC 17, lactate 4.16).   PCCM was consulted for request to transfer to [facility] for further evaluation.  PAST MEDICAL HISTORY :  Past Medical History  Diagnosis Date  . Diabetes mellitus   . Cellulitis of lower leg     x3 on right leg  . Hypertension   . Irregular menstrual bleeding   . PCOS (polycystic ovarian syndrome)   . Tachycardia   . Abnormal Pap smear 2000  . Endometrial ca 12/20/2011  . Asthma     no issues in years  . Family history of anesthesia complication     sister had hard time waking up and problems breathing   Past Surgical History  Procedure Laterality Date  . Hysteroscopy w/d&c  01/24/2012     Procedure: DILATATION AND CURETTAGE /HYSTEROSCOPY;  Surgeon: Betsy Coder, MD;  Location[facility];  Service: Gynecology;  Laterality: N/A;  with vulvar biopsies   . Intrauterine device (iud) insertion  01/24/2012    Procedure: INTRAUTERINE DEVICE (IUD) INSERTION;  Surgeon: Betsy Coder, MD;  Location: [facility];  Service: Gynecology;  Laterality: N/A;  . Dilation and curettage of uterus    . Robotic assisted total hysterectomy with bilateral salpingo oopherectomy Bilateral 07/02/2012    Procedure: ROBOTIC ASSISTED TOTAL HYSTERECTOMY WITH BILATERAL SALPINGO OOPHORECTOMY ;  Surgeon: Imagene Gurney A. Alycia Rossetti, MD;  Location: [facility];  Service: Gynecology;  Laterality: Bilateral;   Prior to Admission medications   Medication Sig Start Date End Date Taking? Authorizing Provider  atorvastatin (LIPITOR) 10 MG tablet Take 10 mg by mouth every morning.     Historical Provider, MD  Canagliflozin (INVOKANA) 300 MG TABS Take by mouth every morning.     Historical Provider, MD  clindamycin (CLEOCIN) 150 MG capsule Take 2 capsules (300 mg total) by mouth every 6 (six) hours. 10/24/12   Orpah Greek, MD  fluconazole (DIFLUCAN) 100 MG tablet Take 1 tablet (100 mg total) by mouth once. 02/26/13   Dorothyann Gibbs, NP  gabapentin (NEURONTIN) 100 MG capsule Take 300 mg by mouth at bedtime.     Historical Provider, MD  insulin aspart (NOVOLOG) 100 UNIT/ML injection Inject 30 Units into the skin 3 (three) times daily with meals.    Historical Provider, MD  insulin glargine (LANTUS) 100 UNIT/ML injection Inject 80 Units into the skin at bedtime. 03/01/11   Bobby Rumpf York, PA-C  labetalol (NORMODYNE) 200 MG tablet Take 200 mg by mouth 2 (two) times daily.    Historical Provider, MD  Liraglutide (VICTOZA) 18 MG/3ML SOLN Inject 1.8 mg into the skin daily.     Historical Provider, MD  lisinopril-hydrochlorothiazide (PRINZIDE,ZESTORETIC) 20-12.5 MG per tablet Take 1 tablet by mouth at bedtime.     Historical Provider, MD   metformin (FORTAMET) 500 MG (OSM) 24 hr tablet Take 1,000 mg by mouth 2 (two) times daily with a meal.     Historical Provider, MD  NON FORMULARY Take 1 capsule by mouth 2 (two) times daily. nutralite omega 3    Historical Provider, MD  NON FORMULARY Take 1 tablet by mouth 2 (two) times daily. nutralite clear now to clear up rosacia    Historical Provider, MD  ONE TOUCH ULTRA TEST test strip  02/05/13   Historical Provider, MD  OVER THE COUNTER MEDICATION Take 1 packet by mouth 2 (two) times daily. Nutralite multi  Vitamins    Historical Provider, MD  OVER THE COUNTER MEDICATION Take 3 tablets by mouth 2 (two) times daily. Tri-Iron  Folic Acid [id number] Vit C 30mg  Fe 10mg     Historical Provider, MD  OVER THE COUNTER MEDICATION Take 1 tablet by mouth 2 (two) times daily. Glucose Kelly    Historical Provider, MD  PROAIR HFA 108 [id number] BASE) MCG/ACT inhaler  11/21/12   Historical Provider, MD  triamcinolone cream (KENALOG) 0.1 %  02/01/13   Historical Provider, MD   Allergies  Allergen Reactions  . Antibacterial Hand Soap [Triclosan]     Dry, itchy patches on skin    FAMILY HISTORY:  Family History  Problem Relation Age of Onset  . Diabetes type II Mother   . Coronary artery disease Mother   . Diabetes type II Father   . Coronary artery disease Father   . Heart disease Sister    SOCIAL HISTORY:  reports that she has never smoked. She has never used smokeless tobacco. She reports that she drinks alcohol. She reports that she does not use illicit drugs.  REVIEW OF SYSTEMS:   All negative; except for those that are bolded, which indicate positives.  Constitutional: weight loss, weight gain, night sweats, fevers, chills, fatigue, weakness.  HEENT: headaches, sore throat, sneezing, nasal congestion, post nasal drip, difficulty swallowing, tooth/dental problems, visual complaints, visual changes, ear aches. Neuro: difficulty with speech, weakness, numbness, ataxia. CV:   chest pain, orthopnea, PND, swelling in lower extremities, dizziness, palpitations, syncope.  Resp: cough, hemoptysis, dyspnea, wheezing. GI  heartburn, indigestion, abdominal pain, nausea, vomiting, diarrhea, constipation, change in bowel habits, loss of appetite, hematemesis, melena, hematochezia.  GU: dysuria, change in color of urine, urgency or frequency, flank pain, hematuria. MSK: joint pain or swelling, decreased range of motion. Psych: change in mood or affect, depression, anxiety, suicidal ideations, homicidal ideations. Skin: rash RLE, itching, bruising.  SUBJECTIVE:   VITAL SIGNS: Temp:  [97.8 F (36.6 C)-99.2 F (37.3 C)] 99.2 F (37.3 C) (08/13 1843) Pulse Rate:  [108-124] 111 (08/13 2000) Resp:  [20-24] 20 (08/13 1843) BP: (82-103)/(33-61) 86/48 mmHg (08/13 2000) SpO2:  [96 %-98 %] 97 % (08/13 2000) Weight:  [130.182 kg (287 lb)] 130.182 kg (287 lb) (  08/13 1618) HEMODYNAMICS:   VENTILATOR SETTINGS:   INTAKE / OUTPUT: Intake/Output     08/13 0701 - 08/14 0700   I.V. (mL/kg) 1000 (7.7)   Total Intake(mL/kg) 1000 (7.7)   Net +1000         PHYSICAL EXAMINATION: General: Morbidly obese female, resting in bed, in NAD. Neuro: A&O x 3, non-focal.  HEENT: [place]/AT. PERRL, sclerae anicteric. Cardiovascular: RRR, no M/R/G.  Lungs: Respirations even and unlabored.  CTA bilaterally, No W/R/R.  Abdomen: BS x 4, soft, NT/ND.  Musculoskeletal: No gross deformities, RLE findings as below.  Skin: Warm.  Right lower extremity erythema, induration, edema.  Tender to palpation.  Right groin and thigh also tender.  LABS: - updated 23.35 by CCM MD  PULMONARY No results found for this basename: PHART, PCO2, PCO2ART, PO2, PO2ART, HCO3, TCO2, O2SAT,  in the last 168 hours  CBC  Recent Labs Lab 08/21/13 1650 08/21/13 2120  HGB 14.5 13.1  HCT 42.7 41.3  WBC 17.1* 20.0*  PLT 256 236    COAGULATION No results found for this basename: INR,  in the last 168 hours  CARDIAC   No results found for this basename: TROPONINI,  in the last 168 hours No results found for this basename: PROBNP,  in the last 168 hours   CHEMISTRY  Recent Labs Lab 08/21/13 1650 08/21/13 2120  NA 140  --   K 4.0  --   CL 101  --   CO2 24  --   GLUCOSE 293*  --   BUN 22  --   CREATININE 0.80 0.86  CALCIUM 10.1  --    Estimated Creatinine Clearance: 117.6 ml/min (by C-G formula based on Cr of 0.86).   LIVER No results found for this basename: AST, ALT, ALKPHOS, BILITOT, PROT, ALBUMIN, INR,  in the last 168 hours   INFECTIOUS  Recent Labs Lab 08/21/13 1736 08/21/13 2124 08/21/13 2215  LATICACIDVEN 4.16* 1.7  --   PROCALCITON  --   --  11.26     ENDOCRINE CBG (last 3)   Recent Labs  08/21/13 1623 08/21/13 2107  GLUCAP 242* 246*         IMAGING x48h No results found.  US Venous Img Lower Unilateral Right  08/21/2013   CLINICAL DATA:  Red hot area on the anterior and medial aspect of the right calf. Evaluate for deep venous thrombosis.  EXAM: RIGHT LOWER EXTREMITY VENOUS DOPPLER ULTRASOUND  TECHNIQUE: Gray-scale sonography with graded compression, as well as color Doppler and duplex ultrasound were performed to evaluate the lower extremity deep venous systems from the level of the common femoral vein and including the common femoral, femoral, profunda femoral, popliteal and calf veins including the posterior tibial, peroneal and gastrocnemius veins when visible. The superficial great saphenous vein was also interrogated. Spectral Doppler was utilized to evaluate flow at rest and with distal augmentation maneuvers in the common femoral, femoral and popliteal veins.  COMPARISON:  05/08/2013.  FINDINGS: Common Femoral Vein: No evidence of thrombus. Normal compressibility, respiratory phasicity and response to augmentation.  Saphenofemoral Junction: No evidence of thrombus. Normal compressibility and flow on color Doppler imaging.  Profunda Femoral Vein: No evidence  of thrombus. Normal compressibility and flow on color Doppler imaging.  Femoral Vein: No evidence of thrombus. Normal compressibility, respiratory phasicity and response to augmentation.  Popliteal Vein: No evidence of thrombus. Normal compressibility, respiratory phasicity and response to augmentation.  Calf Veins: No evidence of thrombus. Normal compressibility and flow on  color Doppler imaging.  Superficial Great Saphenous Vein: No evidence of thrombus. Normal compressibility and flow on color Doppler imaging.  Venous Reflux:  None.  Other Findings:  None.  IMPRESSION: No evidence of right lower extremity deep venous thrombosis.   Electronically Signed   By: [name] M.D.   On: 08/21/2013 19:50     ASSESSMENT / PLAN:  INFECTIOUS A:   Severe Sepsis and occult septic shock in setting of recurrent right lower extremity cellulitis, She is at risk for this due to obesity, chronic edem and saphenous venous incompetency   Associated puppy dog bite at pet store 08/20/13 with small < 1cm abrasion right forearm without cellulitis; dog vaccination history unknown   -- low risk for rabies  - no evidence of cellulitis or soft tissue infection     - But, last tetanus was > 10 years ago  P:   BCx2 8/13 UCx 8/13 Abx: Vanc, start date 8/13, day 1/x Abx: Zosyn, start date 8/13, day 1/x PCT algorithm to limit exposure.  PULMONARY A: Asthma P:   Albuterol PRN.  CARDIOVASCULAR A:  Severe sepsis Hx HTN P:  IVF x 4L. Goal MAP > 55 with sbp > 100 May need CVL and pressors.; can try neo first Trend lactate. Hold outpatient atorvastatin, lisinopril, labetalol for now.  RENAL A:   No acute issues P:   NS @ 100. BMP in AM.  GASTROINTESTINAL A:   Nutrition Obesity P:   Carb modified diet. No role SUP.  HEMATOLOGIC A:   VTE Prophylaxis Hx Endometrial CA - s/p total hysterectomy with b/l salpingooophorectomy P:  SCD's / Heparin. CBC in AM.  ENDOCRINE A:   DM   P:   CBG's  q4hr. SSI. Hold outpatient invokana, novolog, lantus, victoza, metformin for now.  NEUROLOGIC A:   No acute issues P:   Hold outpatient neurontin for now.    Montey Hora, [facility] Pgr: [phone]  or [phone] 08/21/2013, 8:13 PM   STAFF NOTE: I, Dr Ann Lions have personally reviewed patient's available data, including medical history, events of note, physical examination and test results as part of my evaluation. I have discussed with resident/NP and other care providers such as pharmacist, RN and RRT.  In addition,  I personally evaluated patient and elicited key findings of RLE cellulitis - no evidence of nec fasc. Rx with vac and zosyn. SHe is diabetic and high risk for this as well. In addition p puppy bite in pet store 24h ago with small abrasion in left forearm. Risk for rabies is low; she washed her hand immediately after. Will watch without Rabies Ig. Will try to get hold of pet store in AM. Will consider tetanus vaccine; last ?> 10 years ago.  Rest per NP/medical resident whose note is outlined above and that I agree with  The patient is critically ill with multiple organ systems failure and requires high complexity decision making for assessment and support, frequent evaluation and titration of therapies, application of advanced monitoring technologies and extensive interpretation of multiple databases.   Critical Care Time devoted to patient care services described in this note is  35  Minutes.  Dr. Brand Males, M.D., [facility].P Pulmonary and Critical Care Medicine Staff Physician [facility] Pager: [phone], If no answer or between  15:00h - 7:00h: call [phone]  08/21/2013 11:49 PM

## 2013-08-21 NOTE — ED Provider Notes (Signed)
CSN: [id number]     Arrival date & time 08/21/13  1604 History   First MD Initiated Contact with Patient 08/21/13 1623     Chief Complaint  Patient presents with  . Leg Swelling     (Consider location/radiation/quality/duration/timing/severity/associated sxs/prior Treatment) Patient is a 38 y.o. female presenting with rash. The history is provided by the patient.  Rash Quality: painful and redness   Pain details:    Quality:  Hot and sharp   Severity:  Moderate   Onset quality:  Gradual   Timing:  Constant   Progression:  Worsening Severity:  Mild Onset quality:  Gradual Timing:  Constant Associated symptoms: nausea   Associated symptoms: no abdominal pain, no fever, no shortness of breath and not vomiting     Past Medical History  Diagnosis Date  . Diabetes mellitus   . Cellulitis of lower leg     x3 on right leg  . Hypertension   . Irregular menstrual bleeding   . PCOS (polycystic ovarian syndrome)   . Tachycardia   . Abnormal Pap smear 2000  . Endometrial ca 12/20/2011  . Asthma     no issues in years  . Family history of anesthesia complication     sister had hard time waking up and problems breathing   Past Surgical History  Procedure Laterality Date  . Hysteroscopy w/d&c  01/24/2012    Procedure: DILATATION AND CURETTAGE /HYSTEROSCOPY;  Surgeon: Betsy Coder, MD;  Location: [facility];  Service: Gynecology;  Laterality: N/A;  with vulvar biopsies   . Intrauterine device (iud) insertion  01/24/2012    Procedure: INTRAUTERINE DEVICE (IUD) INSERTION;  Surgeon: Betsy Coder, MD;  Location: [facility];  Service: Gynecology;  Laterality: N/A;  . Dilation and curettage of uterus    . Robotic assisted total hysterectomy with bilateral salpingo oopherectomy Bilateral 07/02/2012    Procedure: ROBOTIC ASSISTED TOTAL HYSTERECTOMY WITH BILATERAL SALPINGO OOPHORECTOMY ;  Surgeon: Imagene Gurney A. Alycia Rossetti, MD;  Location: [facility];  Service: Gynecology;  Laterality: Bilateral;   Family  History  Problem Relation Age of Onset  . Diabetes type II Mother   . Coronary artery disease Mother   . Diabetes type II Father   . Coronary artery disease Father   . Heart disease Sister    History  Substance Use Topics  . Smoking status: Never Smoker   . Smokeless tobacco: Never Used  . Alcohol Use: Yes     Comment: rare   OB History   Grav Para Term Preterm Abortions TAB SAB Ect Mult Living   0              Review of Systems  Constitutional: Negative for fever.  Respiratory: Negative for cough and shortness of breath.   Gastrointestinal: Positive for nausea. Negative for vomiting and abdominal pain.  Skin: Positive for rash.  All other systems reviewed and are negative.     Allergies  Antibacterial hand soap  Home Medications   Prior to Admission medications   Medication Sig Start Date End Date Taking? Authorizing Provider  atorvastatin (LIPITOR) 10 MG tablet Take 10 mg by mouth every morning.     Historical Provider, MD  Canagliflozin (INVOKANA) 300 MG TABS Take by mouth every morning.     Historical Provider, MD  clindamycin (CLEOCIN) 150 MG capsule Take 2 capsules (300 mg total) by mouth every 6 (six) hours. 10/24/12   Orpah Greek, MD  fluconazole (DIFLUCAN) 100 MG tablet Take 1 tablet (100  mg total) by mouth once. 02/26/13   Dorothyann Gibbs, NP  gabapentin (NEURONTIN) 100 MG capsule Take 300 mg by mouth at bedtime.     Historical Provider, MD  insulin aspart (NOVOLOG) 100 UNIT/ML injection Inject 30 Units into the skin 3 (three) times daily with meals.    Historical Provider, MD  insulin glargine (LANTUS) 100 UNIT/ML injection Inject 80 Units into the skin at bedtime. 03/01/11   Bobby Rumpf [place], PA-C  labetalol (NORMODYNE) 200 MG tablet Take 200 mg by mouth 2 (two) times daily.    Historical Provider, MD  Liraglutide (VICTOZA) 18 MG/3ML SOLN Inject 1.8 mg into the skin daily.     Historical Provider, MD  lisinopril-hydrochlorothiazide  (PRINZIDE,ZESTORETIC) 20-12.5 MG per tablet Take 1 tablet by mouth at bedtime.     Historical Provider, MD  metformin (FORTAMET) 500 MG (OSM) 24 hr tablet Take 1,000 mg by mouth 2 (two) times daily with a meal.     Historical Provider, MD  NON FORMULARY Take 1 capsule by mouth 2 (two) times daily. nutralite omega 3    Historical Provider, MD  NON FORMULARY Take 1 tablet by mouth 2 (two) times daily. nutralite clear now to clear up rosacia    Historical Provider, MD  ONE TOUCH ULTRA TEST test strip  02/05/13   Historical Provider, MD  OVER THE COUNTER MEDICATION Take 1 packet by mouth 2 (two) times daily. Nutralite multi  Vitamins    Historical Provider, MD  OVER THE COUNTER MEDICATION Take 3 tablets by mouth 2 (two) times daily. Tri-Iron  Folic Acid 671IWP Vit C 30mg  Fe 10mg     Historical Provider, MD  OVER THE COUNTER MEDICATION Take 1 tablet by mouth 2 (two) times daily. Glucose Secor    Historical Provider, MD  PROAIR HFA 108 [phone] BASE) MCG/ACT inhaler  11/21/12   Historical Provider, MD  triamcinolone cream (KENALOG) 0.1 %  02/01/13   Historical Provider, MD   BP 103/47  Pulse 124  Temp(Src) 97.8 F (36.6 C) (Oral)  Resp 22  Ht 5' 4.5" (1.638 m)  Wt 287 lb (130.182 kg)  BMI 48.52 kg/m2  SpO2 97%  LMP 01/17/2012 Physical Exam  Nursing note and vitals reviewed. Constitutional: She is oriented to person, place, and time. She appears well-developed and well-nourished. No distress.  HENT:  Head: Normocephalic and atraumatic.  Mouth/Throat: Oropharynx is clear and moist.  Eyes: EOM are normal. Pupils are equal, round, and reactive to light.  Neck: Normal range of motion. Neck supple.  Cardiovascular: Normal rate and regular rhythm.  Exam reveals no friction rub.   No murmur heard. Pulmonary/Chest: Effort normal and breath sounds normal. No respiratory distress. She has no wheezes. She has no rales.  Abdominal: Soft. She exhibits no distension. There is no tenderness.  There is no rebound.  Musculoskeletal: Normal range of motion. She exhibits no edema.       Legs: Neurological: She is alert and oriented to person, place, and time.  Skin: She is not diaphoretic.    ED Course  Procedures (including critical care time) Labs Review Labs Reviewed  CBG MONITORING, ED - Abnormal; Notable for the following:    Glucose-Capillary 242 (*)    All other components within normal limits  CULTURE, BLOOD (ROUTINE X 2)  CULTURE, BLOOD (ROUTINE X 2)  CBC  BASIC METABOLIC PANEL    Imaging Review US Venous Img Lower Unilateral Right  08/21/2013   CLINICAL DATA:  Red hot area on  the anterior and medial aspect of the right calf. Evaluate for deep venous thrombosis.  EXAM: RIGHT LOWER EXTREMITY VENOUS DOPPLER ULTRASOUND  TECHNIQUE: Gray-scale sonography with graded compression, as well as color Doppler and duplex ultrasound were performed to evaluate the lower extremity deep venous systems from the level of the common femoral vein and including the common femoral, femoral, profunda femoral, popliteal and calf veins including the posterior tibial, peroneal and gastrocnemius veins when visible. The superficial great saphenous vein was also interrogated. Spectral Doppler was utilized to evaluate flow at rest and with distal augmentation maneuvers in the common femoral, femoral and popliteal veins.  COMPARISON:  05/08/2013.  FINDINGS: Common Femoral Vein: No evidence of thrombus. Normal compressibility, respiratory phasicity and response to augmentation.  Saphenofemoral Junction: No evidence of thrombus. Normal compressibility and flow on color Doppler imaging.  Profunda Femoral Vein: No evidence of thrombus. Normal compressibility and flow on color Doppler imaging.  Femoral Vein: No evidence of thrombus. Normal compressibility, respiratory phasicity and response to augmentation.  Popliteal Vein: No evidence of thrombus. Normal compressibility, respiratory phasicity and response to  augmentation.  Calf Veins: No evidence of thrombus. Normal compressibility and flow on color Doppler imaging.  Superficial Great Saphenous Vein: No evidence of thrombus. Normal compressibility and flow on color Doppler imaging.  Venous Reflux:  None.  Other Findings:  None.  IMPRESSION: No evidence of right lower extremity deep venous thrombosis.   Electronically Signed   By: [name] M.D.   On: 08/21/2013 19:50     EKG Interpretation None      CRITICAL CARE Performed by: Osvaldo Shipper   Total critical care time: 45 minutes  Critical care time was exclusive of separately billable procedures and treating other patients.  Critical care was necessary to treat or prevent imminent or life-threatening deterioration.  Critical care was time spent personally by me on the following activities: development of treatment plan with patient and/or surrogate as well as nursing, discussions with consultants, evaluation of patient's response to treatment, examination of patient, obtaining history from patient or surrogate, ordering and performing treatments and interventions, ordering and review of laboratory studies, ordering and review of radiographic studies, pulse oximetry and re-evaluation of patient's condition.  MDM   Final diagnoses:  Septic shock  Cellulitis of right lower extremity    75F here with R leg redness, pain, swelling. Began this morning. No fevers. Hx of R leg cellulitis and chronic lymphedema. Hx of cellulitis requiring hospitalizations. R leg cellulitis here, no major erythema or induration.   labs show white count of 17. Lactate is 4. Pressures in the 80s despite fluid resuscitation. Heart rates are improving. Antibodies widened to include Zosyn. Urine done to look for other source of infection. Tetanus given for her dog bite. With persistent pressures in the 80s, admitted to the ICU for further antibiotics and treatment for her severe sepsis.  Evelina Bucy,  MD 08/21/13 [phone]

## 2013-08-22 DIAGNOSIS — E669 Obesity, unspecified: Secondary | ICD-10-CM

## 2013-08-22 LAB — CK TOTAL AND CKMB (NOT AT ARMC)
CK, MB: 1.1 ng/mL (ref 0.3–4.0)
Relative Index: INVALID (ref 0.0–2.5)
Total CK: 38 U/L (ref 7–177)

## 2013-08-22 LAB — MAGNESIUM: Magnesium: 1.9 mg/dL (ref 1.5–2.5)

## 2013-08-22 LAB — HEPATIC FUNCTION PANEL
ALK PHOS: 37 U/L — AB (ref 39–117)
ALT: 24 U/L (ref 0–35)
AST: 16 U/L (ref 0–37)
Albumin: 3.4 g/dL — ABNORMAL LOW (ref 3.5–5.2)
Bilirubin, Direct: <0.2 mg/dL (ref 0.0–0.3)
Total Bilirubin: 0.4 mg/dL (ref 0.3–1.2)
Total Protein: 5.9 g/dL — ABNORMAL LOW (ref 6.0–8.3)

## 2013-08-22 LAB — BASIC METABOLIC PANEL
ANION GAP: 14 (ref 5–15)
BUN: 20 mg/dL (ref 6–23)
CO2: 20 mEq/L (ref 19–32)
Calcium: 9.1 mg/dL (ref 8.4–10.5)
Chloride: 106 mEq/L (ref 96–112)
Creatinine, Ser: 0.71 mg/dL (ref 0.50–1.10)
GFR calc non Af Amer: >90 mL/min (ref >90)
Glucose, Bld: 201 mg/dL — ABNORMAL HIGH (ref 70–99)
POTASSIUM: 4.2 meq/L (ref 3.7–5.3)
SODIUM: 140 meq/L (ref 137–147)

## 2013-08-22 LAB — GLUCOSE, CAPILLARY
GLUCOSE-CAPILLARY: 170 mg/dL — AB (ref 70–99)
Glucose-Capillary: 244 mg/dL — ABNORMAL HIGH (ref 70–99)
Glucose-Capillary: 114 mg/dL — ABNORMAL HIGH (ref 70–99)
Glucose-Capillary: 161 mg/dL — ABNORMAL HIGH (ref 70–99)
Glucose-Capillary: 187 mg/dL — ABNORMAL HIGH (ref 70–99)
Glucose-Capillary: 127 mg/dL — ABNORMAL HIGH (ref 70–99)

## 2013-08-22 LAB — URINE CULTURE
Colony Count: 30000
SPECIAL REQUESTS: NORMAL

## 2013-08-22 LAB — CBC
HCT: 41.7 % (ref 36.0–46.0)
HEMOGLOBIN: 13.4 g/dL (ref 12.0–15.0)
MCH: 33.3 pg (ref 26.0–34.0)
MCHC: 32.1 g/dL (ref 30.0–36.0)
MCV: 103.7 fL — ABNORMAL HIGH (ref 78.0–100.0)
Platelets: 235 10*3/uL (ref 150–400)
RBC: 4.02 MIL/uL (ref 3.87–5.11)
RDW: 13.9 % (ref 11.5–15.5)
WBC: 20.5 10*3/uL — ABNORMAL HIGH (ref 4.0–10.5)

## 2013-08-22 LAB — LACTIC ACID, PLASMA
LACTIC ACID, VENOUS: 1.4 mmol/L (ref 0.5–2.2)
Lactic Acid, Venous: 1.6 mmol/L (ref 0.5–2.2)
Lactic Acid, Venous: 1.7 mmol/L (ref 0.5–2.2)

## 2013-08-22 LAB — PHOSPHORUS: PHOSPHORUS: 3.6 mg/dL (ref 2.3–4.6)

## 2013-08-22 LAB — PROCALCITONIN: Procalcitonin: 10.34 ng/mL

## 2013-08-22 MED ORDER — MORPHINE SULFATE 2 MG/ML IJ SOLN
1.0000 mg | INTRAMUSCULAR | Status: DC | PRN
  Administered 2013-08-22 (×2): 4 mg via INTRAVENOUS
  Administered 2013-08-22: 2 mg via INTRAVENOUS
  Filled 2013-08-22: qty 2
  Filled 2013-08-22: qty 1
  Filled 2013-08-22: qty 2

## 2013-08-22 MED ORDER — TETANUS-DIPHTH-ACELL PERTUSSIS 5-2.5-18.5 LF-MCG/0.5 IM SUSP: 0.5000 mL | INTRAMUSCULAR | Status: DC | PRN

## 2013-08-22 MED ORDER — ONDANSETRON HCL 4 MG/2ML IJ SOLN
4.0000 mg | Freq: Four times a day (QID) | INTRAMUSCULAR | Status: DC | PRN
Start: 2013-08-22 — End: 2013-08-26
  Administered 2013-08-22 – 2013-08-23 (×2): 4 mg via INTRAVENOUS
  Filled 2013-08-22 (×2): qty 2

## 2013-08-22 NOTE — H&P (Signed)
PULMONARY / CRITICAL CARE MEDICINE   Name: Kelli Allen MRN: [id number] DOB: 10/11/1975    ADMISSION DATE:  08/21/2013 CONSULTATION DATE:  08/22/2013  REFERRING MD :  EDP  CHIEF COMPLAINT:  Recurrent cellulitis of right leg  INITIAL PRESENTATION:   Kelli Allen is a 38 y.o. F with PMH as outlined below, which includes a history of recurrent cellulitis of the right lower extremity.  She presented to [facility] on 8/13 with redness and swelling of the right lower extremity again.  Symptoms began earlier in the day  Reported that this was the same presentation from last episode of cellulitis (August 2014, treated with Clindamycin at that time).  She also has hx of lymphedema. Of note, she reports that she was bitten by a puppy at the pet store 1 day ago.  Bite is on right hand, non-tender and asymptomatic. She denies fevers/chills/sweats, chest pain, SOB, N/V/D, abdominal pain. In ED, found to have severe sepsis (BP 86/48, WBC 17, lactate 4.16).   PCCM was consulted for request to transfer to [facility] for further evaluation. ADmitted 08/21/2013    EVENT/STUDIES:  LE Duplex 8/13 >>> No DVT. 8/13 - presented to [facility] with redness and swelling of right leg and severe sepsis.   SUBJECTIVE:   08/22/13: never needed pressors. BP improved significantly. Feels better. However , RLE redness might be progressing v stabiizing  VITAL SIGNS: Temp:  [97.7 F (36.5 C)-99.2 F (37.3 C)] 98.8 F (37.1 C) (08/14 0904) Pulse Rate:  [90-124] 99 (08/14 1100) Resp:  [14-31] 19 (08/14 1100) BP: (82-133)/(33-75) 122/63 mmHg (08/14 1100) SpO2:  [93 %-99 %] 97 % (08/14 1100) Weight:  [130 kg (286 lb 9.6 oz)-132 kg (291 lb 0.1 oz)] 132 kg (291 lb 0.1 oz) (08/14 0500) HEMODYNAMICS:   VENTILATOR SETTINGS:   INTAKE / OUTPUT: Intake/Output     08/13 0701 - 08/14 0700 08/14 0701 - 08/15 0700   P.O. 2300 480   I.V. (mL/kg) 2900 (22) 500 (3.8)   IV Piggyback 300 50   Total Intake(mL/kg) 5500 (41.7) 1030 (7.8)   Urine (mL/kg/hr) 4900    Total Output 4900     Net +600 +1030        Urine Occurrence  2 x     PHYSICAL EXAMINATION: General: Morbidly obese female, resting in bed, in NAD. Neuro: A&O x 3, non-focal.  HEENT: [place]/AT. PERRL, sclerae anicteric. Cardiovascular: RRR, no M/R/G.  Lungs: Respirations even and unlabored.  CTA bilaterally, No W/R/R.  Abdomen: BS x 4, soft, NT/ND.  Musculoskeletal: No gross deformities, RLE findings as below.  Skin: Warm.  Right lower extremity erythema, induration, edema.  Tender to palpation.  Right groin and thigh also tender. Area marked at noon 08/22/13  LABS: - updated 23.35 by CCM MD  PULMONARY No results found for this basename: PHART, PCO2, PCO2ART, PO2, PO2ART, HCO3, TCO2, O2SAT,  in the last 168 hours  CBC  Recent Labs Lab 08/21/13 1650 08/21/13 2120 08/22/13 0215  HGB 14.5 13.1 13.4  HCT 42.7 41.3 41.7  WBC 17.1* 20.0* 20.5*  PLT 256 236 235    COAGULATION No results found for this basename: INR,  in the last 168 hours  CARDIAC  No results found for this basename: TROPONINI,  in the last 168 hours No results found for this basename: PROBNP,  in the last 168 hours   [place] Lab 08/21/13 1650 08/21/13 2120 08/22/13 0215  NA 140  --  140  K 4.0  --  4.2  CL 101  --  106  CO2 24  --  20  GLUCOSE 293*  --  201*  BUN 22  --  20  CREATININE 0.80 0.86 0.71  CALCIUM 10.1  --  9.1  MG  --   --  1.9  PHOS  --   --  3.6   Estimated Creatinine Clearance: 127.6 ml/min (by C-G formula based on Cr of 0.71).   LIVER  Recent Labs Lab 08/22/13 0215  AST 16  ALT 24  ALKPHOS 37*  BILITOT 0.4  PROT 5.9*  ALBUMIN 3.4*     INFECTIOUS  Recent Labs Lab 08/21/13 2124 08/21/13 2215 08/22/13 0030 08/22/13 0215 08/22/13 1025  LATICACIDVEN 1.7  --  1.7 1.6 1.4  PROCALCITON  --  11.26  --  10.34  --      ENDOCRINE CBG (last 3)   Recent Labs  08/22/13 0100 08/22/13 0345 08/22/13 0854  GLUCAP 244* 170*  114*         IMAGING x48h US Venous Img Lower Unilateral Right  08/21/2013   CLINICAL DATA:  Red hot area on the anterior and medial aspect of the right calf. Evaluate for deep venous thrombosis.  EXAM: RIGHT LOWER EXTREMITY VENOUS DOPPLER ULTRASOUND  TECHNIQUE: Gray-scale sonography with graded compression, as well as color Doppler and duplex ultrasound were performed to evaluate the lower extremity deep venous systems from the level of the common femoral vein and including the common femoral, femoral, profunda femoral, popliteal and calf veins including the posterior tibial, peroneal and gastrocnemius veins when visible. The superficial great saphenous vein was also interrogated. Spectral Doppler was utilized to evaluate flow at rest and with distal augmentation maneuvers in the common femoral, femoral and popliteal veins.  COMPARISON:  05/08/2013.  FINDINGS: Common Femoral Vein: No evidence of thrombus. Normal compressibility, respiratory phasicity and response to augmentation.  Saphenofemoral Junction: No evidence of thrombus. Normal compressibility and flow on color Doppler imaging.  Profunda Femoral Vein: No evidence of thrombus. Normal compressibility and flow on color Doppler imaging.  Femoral Vein: No evidence of thrombus. Normal compressibility, respiratory phasicity and response to augmentation.  Popliteal Vein: No evidence of thrombus. Normal compressibility, respiratory phasicity and response to augmentation.  Calf Veins: No evidence of thrombus. Normal compressibility and flow on color Doppler imaging.  Superficial Great Saphenous Vein: No evidence of thrombus. Normal compressibility and flow on color Doppler imaging.  Venous Reflux:  None.  Other Findings:  None.  IMPRESSION: No evidence of right lower extremity deep venous thrombosis.   Electronically Signed   By: [name] M.D.   On: 08/21/2013 19:50    US Venous Img Lower Unilateral Right  08/21/2013   CLINICAL DATA:  Red hot  area on the anterior and medial aspect of the right calf. Evaluate for deep venous thrombosis.  EXAM: RIGHT LOWER EXTREMITY VENOUS DOPPLER ULTRASOUND  TECHNIQUE: Gray-scale sonography with graded compression, as well as color Doppler and duplex ultrasound were performed to evaluate the lower extremity deep venous systems from the level of the common femoral vein and including the common femoral, femoral, profunda femoral, popliteal and calf veins including the posterior tibial, peroneal and gastrocnemius veins when visible. The superficial great saphenous vein was also interrogated. Spectral Doppler was utilized to evaluate flow at rest and with distal augmentation maneuvers in the common femoral, femoral and popliteal veins.  COMPARISON:  05/08/2013.  FINDINGS: Common Femoral Vein: No evidence of thrombus. Normal compressibility, respiratory phasicity and response to  augmentation.  Saphenofemoral Junction: No evidence of thrombus. Normal compressibility and flow on color Doppler imaging.  Profunda Femoral Vein: No evidence of thrombus. Normal compressibility and flow on color Doppler imaging.  Femoral Vein: No evidence of thrombus. Normal compressibility, respiratory phasicity and response to augmentation.  Popliteal Vein: No evidence of thrombus. Normal compressibility, respiratory phasicity and response to augmentation.  Calf Veins: No evidence of thrombus. Normal compressibility and flow on color Doppler imaging.  Superficial Great Saphenous Vein: No evidence of thrombus. Normal compressibility and flow on color Doppler imaging.  Venous Reflux:  None.  Other Findings:  None.  IMPRESSION: No evidence of right lower extremity deep venous thrombosis.   Electronically Signed   By: [name] M.D.   On: 08/21/2013 19:50     ASSESSMENT / PLAN:  INFECTIOUS A:   Severe Sepsis and occult septic shock in setting of recurrent right lower extremity cellulitis, She is at risk for this due to obesity, chronic  edem and saphenous venous incompetency   Associated unprovoked puppy dog bite at [employer] 08/20/13 with small < 1cm abrasion right forearm without cellulitis; dog vaccination history unknown. She washed it immediately with soap and water   -- low risk for rabies due to above factors  - no evidence of cellulitis or soft tissue infection     - But, last tetanus was > 10 years ago BCx2 8/13 UCx 8/13  P #Cellulitis Abx: Vanc, start date 8/13, day 1/x Abx: Zosyn, start date 8/13, day 1/x PCT algorithm   #Puppy Dog Bite - -hold off rabies IgG and antibody due to low risk - will [facility] and have them observe mini pinscher pup for 10 days -tetanus toxoid; giave when patient better (uptodate says precaution in sick people nos)  PULMONARY A: Asthma - not active P:   Albuterol PRN.  CARDIOVASCULAR A:  Severe sepsis Hx HTN   - improved bp, never needed pressors  P:  IVF @ 75cc/h. Goal MAP > 55 with sbp > 100 May need CVL and pressors.; can try neo first Trend lactate. Hold outpatient atorvastatin, lisinopril, labetalol for now.  RENAL A:   No acute issues P:   NS @ 75 BMP in AM.  GASTROINTESTINAL A:   Nutrition Obesity P:   Carb modified diet. No role SUP.  HEMATOLOGIC A:   VTE Prophylaxis Hx Endometrial CA - s/p total hysterectomy with b/l salpingooophorectomy P:  SCD's / Heparin. CBC in AM.  ENDOCRINE A:   DM   P:   CBG's q4hr. SSI. Hold outpatient invokana, novolog, lantus, victoza, metformin for now.  NEUROLOGIC A:   No acute issues but has pain RLE area of cellulitis P:   Hold outpatient neurontin for now. Morphine prn (she says this only med that works)  GLOBAL 08/22/13: move to med surg / TRH to be primary from 08/23/13 and pccm off   Dr. Brand Males, M.D., [facility].C.P Pulmonary and Critical Care Medicine Staff Physician [place] [facility] Pager: [phone], If no answer or  between  15:00h - 7:00h: call [phone]  08/22/2013 12:34 PM

## 2013-08-22 NOTE — Significant Event (Signed)
1830pm-Patient has been transferred to 6N27 safely, via wheelchair. VS stable prior and during the transfer. All personal belongings taken with patient's spouse. Patient settled in chair to eat dinner. Receiving staff in room. Report given to receiving RN. Sharad Vaneaton, Therapist, sports.

## 2013-08-22 NOTE — Progress Notes (Signed)
Utilization review completed. Saryna Kneeland, RN, BSN. 

## 2013-08-22 NOTE — Plan of Care (Signed)
Problem: Phase I Progression Outcomes Goal: OOB as tolerated unless otherwise ordered Outcome: Progressing Patient got OOB to bathroom with standby assist. Able to bear full weight on affected extremity. Patient states there is discomfort when ambulating but it is tolerable. Gait is steady. Goal: Voiding-avoid urinary catheter unless indicated Outcome: Progressing Patient has no difficulty voiding. Goal: Hemodynamically stable Outcome: Progressing Goal SBP >100 and MAP >65. BP within parameters this evening. HR is improved; now low 100s, occasionally SR in the 90s. Afebrile.

## 2013-08-23 ENCOUNTER — Inpatient Hospital Stay (HOSPITAL_COMMUNITY): Payer: BC Managed Care – PPO

## 2013-08-23 DIAGNOSIS — J45909 Unspecified asthma, uncomplicated: Secondary | ICD-10-CM

## 2013-08-23 DIAGNOSIS — R6520 Severe sepsis without septic shock: Secondary | ICD-10-CM

## 2013-08-23 DIAGNOSIS — R6521 Severe sepsis with septic shock: Secondary | ICD-10-CM

## 2013-08-23 DIAGNOSIS — L02419 Cutaneous abscess of limb, unspecified: Secondary | ICD-10-CM

## 2013-08-23 DIAGNOSIS — L03119 Cellulitis of unspecified part of limb: Secondary | ICD-10-CM

## 2013-08-23 DIAGNOSIS — A419 Sepsis, unspecified organism: Principal | ICD-10-CM

## 2013-08-23 LAB — GLUCOSE, CAPILLARY
GLUCOSE-CAPILLARY: 142 mg/dL — AB (ref 70–99)
GLUCOSE-CAPILLARY: 170 mg/dL — AB (ref 70–99)
GLUCOSE-CAPILLARY: 177 mg/dL — AB (ref 70–99)
GLUCOSE-CAPILLARY: 203 mg/dL — AB (ref 70–99)
Glucose-Capillary: 133 mg/dL — ABNORMAL HIGH (ref 70–99)
Glucose-Capillary: 148 mg/dL — ABNORMAL HIGH (ref 70–99)

## 2013-08-23 LAB — PHOSPHORUS: Phosphorus: 3.1 mg/dL (ref 2.3–4.6)

## 2013-08-23 LAB — BASIC METABOLIC PANEL
Anion gap: 11 (ref 5–15)
BUN: 10 mg/dL (ref 6–23)
CO2: 26 mEq/L (ref 19–32)
CREATININE: 0.81 mg/dL (ref 0.50–1.10)
Calcium: 9.6 mg/dL (ref 8.4–10.5)
Chloride: 103 mEq/L (ref 96–112)
Glucose, Bld: 176 mg/dL — ABNORMAL HIGH (ref 70–99)
Potassium: 4.3 mEq/L (ref 3.7–5.3)
Sodium: 140 mEq/L (ref 137–147)

## 2013-08-23 LAB — PROCALCITONIN: Procalcitonin: 3.99 ng/mL

## 2013-08-23 LAB — LACTIC ACID, PLASMA: LACTIC ACID, VENOUS: 0.9 mmol/L (ref 0.5–2.2)

## 2013-08-23 LAB — TROPONIN I
Troponin I: <0.3 ng/mL (ref <0.30)
Troponin I: <0.3 ng/mL (ref <0.30)
Troponin I: <0.3 ng/mL (ref <0.30)

## 2013-08-23 LAB — MAGNESIUM: Magnesium: 2.2 mg/dL (ref 1.5–2.5)

## 2013-08-23 LAB — HEMOGLOBIN A1C
HEMOGLOBIN A1C: 8.2 % — AB (ref <5.7)
Mean Plasma Glucose: 189 mg/dL — ABNORMAL HIGH (ref <117)

## 2013-08-23 MED ORDER — ATORVASTATIN CALCIUM 10 MG PO TABS
10.0000 mg | ORAL_TABLET | Freq: Every day | ORAL | Status: DC
  Administered 2013-08-23 – 2013-08-25 (×3): 10 mg via ORAL
  Filled 2013-08-23 (×4): qty 1

## 2013-08-23 MED ORDER — LABETALOL HCL 200 MG PO TABS
200.0000 mg | ORAL_TABLET | Freq: Two times a day (BID) | ORAL | Status: DC
  Administered 2013-08-23 – 2013-08-26 (×7): 200 mg via ORAL
  Filled 2013-08-23: qty 1
  Filled 2013-08-23: qty 2
  Filled 2013-08-23 (×3): qty 1
  Filled 2013-08-23: qty 2
  Filled 2013-08-23 (×4): qty 1

## 2013-08-23 MED ORDER — MORPHINE SULFATE 2 MG/ML IJ SOLN
1.0000 mg | INTRAMUSCULAR | Status: DC | PRN
  Administered 2013-08-23 – 2013-08-24 (×5): 4 mg via INTRAVENOUS
  Filled 2013-08-23 (×5): qty 2

## 2013-08-23 MED ORDER — LEVALBUTEROL HCL 1.25 MG/0.5ML IN NEBU
1.2500 mg | INHALATION_SOLUTION | RESPIRATORY_TRACT | Status: DC | PRN
  Filled 2013-08-23: qty 0.5

## 2013-08-23 MED ORDER — GABAPENTIN 300 MG PO CAPS
300.0000 mg | ORAL_CAPSULE | Freq: Every day | ORAL | Status: DC
  Administered 2013-08-23 – 2013-08-25 (×3): 300 mg via ORAL
  Filled 2013-08-23 (×4): qty 1

## 2013-08-23 MED ORDER — PIPERACILLIN-TAZOBACTAM 3.375 G IVPB
3.3750 g | Freq: Three times a day (TID) | INTRAVENOUS | Status: DC
  Administered 2013-08-23 – 2013-08-26 (×9): 3.375 g via INTRAVENOUS
  Filled 2013-08-23 (×11): qty 50

## 2013-08-23 MED ORDER — OXYCODONE-ACETAMINOPHEN 5-325 MG PO TABS
1.0000 | ORAL_TABLET | ORAL | Status: DC | PRN
  Administered 2013-08-24 – 2013-08-26 (×10): 2 via ORAL
  Filled 2013-08-23 (×10): qty 2

## 2013-08-23 NOTE — Progress Notes (Signed)
Patient feels better. Still tachycardic at 107/min. No complaints of chest pain.  Increased redness to R leg noted and marked. Will continue to monitor. KYoung RN

## 2013-08-23 NOTE — Progress Notes (Signed)
[facility] PROGRESS NOTE  Kelli Allen HYQ:[id number] DOB: 1975-12-23 DOA: 08/21/2013 PCP: Idamae Schuller, MD  Assessment/Plan  Severe sepsis due to recurrent RLE cellulitis, now appears to be improving.  Had low-grade fever last night of persistent tachycardia. -  Replaced peripheral IV -  Continue vancomycin and Zosyn, day 2 -  Continue lower extremity elevation  Dog bite, no evidence of infection -  [facility] to ask them to observe the dog for signs of rabies -  Tetanus toxoid at time of discharge  Chest pressure, ddx includes ACS (less likely), asthma, acute diastolic heart failure from fluid resuscitation.  PE less likely given lack of VTE on lower extremity duplex and she has been receiving DVT proph since admission -  Tele:  Sinus tach, no arrhythmias -  Cycle troponins -  Check CXR -  Consider CT angio chest to rule out PE if persistent tachycardia and chest pressure  Sinus tachycardia, likely due to intermittent fevers and severe sepsis and holding beta blocker  -  Resume beta blocker -  Defer TSH for now due to severity of acute illness  Asthma without cough but with reduced breath sounds bilaterally -  Schedule xopenex -  D/c albuterol  Obesity, being counseled and followed by [facility]  Diabetes mellitus type 2, being counseled and followed by [facility] -  CBG well controlled on current SSI  Diet:  Diabetic Access:  None IVF:  Off Proph:  Heparin  Code Status:  Full Family Communication:  Patient and husband Disposition Plan:  Pending improvement in cellulitis and vital signs   Consultants:   [facility] M.  Procedures:   Duplex right lower extremity  Antibiotics:   Vancomycin 8/13 >>  Zosyn 8/13 >>   HPI/Subjective:  Had episode of substernal chest pressure with radiation to bilateral chest associated with nausea and vomiting and shortness of breath overnight. Her symptoms improved with  morphine and Zofran. She feels that her symptoms are associated with worsening right lower extremity pain. Her fever has come down since last night, and the erythema which had expanded is now starting to recede.  Objective: Filed Vitals:   08/22/13 1856 08/22/13 2108 08/23/13 0206 08/23/13 0637  BP: 125/63 131/63 135/77 129/70  Pulse: 95 119 104 101  Temp: 100.2 F (37.9 C) 99 F (37.2 C) 98.1 F (36.7 C) 98.3 F (36.8 C)  TempSrc: Oral Oral    Resp: 18 19 18 19   Height:      Weight:    134.129 kg (295 lb 11.2 oz)  SpO2: 100% 96% 99% 97%    Intake/Output Summary (Last 24 hours) at 08/23/13 0814 Last data filed at 08/23/13 0400  Gross per 24 hour  Intake   1620 ml  Output   3150 ml  Net  -1530 ml   Filed Weights   08/21/13 2100 08/22/13 0500 08/23/13 0637  Weight: 130 kg (286 lb 9.6 oz) 132 kg (291 lb 0.1 oz) 134.129 kg (295 lb 11.2 oz)    Exam:   General:  WF, No acute distress  HEENT:  NCAT, MMM  Cardiovascular:  Tachycardic RR, nl S1, S2 no mrg, 2+ pulses, warm extremities  Respiratory:  Diminished bilateral breath sounds, no wheezes, rhonchi, or focal rales, no increased WOB  Abdomen:   NABS, soft, NT/ND  MSK:   Normal tone and bulk,  trace bilater left LEE.  2+ right lower extremity edema with erythema that extends from just below the knees  to the top of the foot, warm to touch, it darkening area on the anterior right shin. Small area located along the medial right thigh that is pink and receding from the line from last night.   Neuro:  Grossly intact  Data Reviewed: Basic Metabolic Panel:  Recent Labs Lab 08/21/13 1650 08/21/13 2120 08/22/13 0215 08/23/13 0132  NA 140  --  140 140  K 4.0  --  4.2 4.3  CL 101  --  106 103  CO2 24  --  20 26  GLUCOSE 293*  --  201* 176*  BUN 22  --  20 10  CREATININE 0.80 0.86 0.71 0.81  CALCIUM 10.1  --  9.1 9.6  MG  --   --  1.9 2.2  PHOS  --   --  3.6 3.1   Liver Function Tests:  Recent Labs Lab  08/22/13 0215  AST 16  ALT 24  ALKPHOS 37*  BILITOT 0.4  PROT 5.9*  ALBUMIN 3.4*   No results found for this basename: LIPASE, AMYLASE,  in the last 168 hours No results found for this basename: AMMONIA,  in the last 168 hours CBC:  Recent Labs Lab 08/21/13 1650 08/21/13 2120 08/22/13 0215  WBC 17.1* 20.0* 20.5*  HGB 14.5 13.1 13.4  HCT 42.7 41.3 41.7  MCV 101.7* 102.5* 103.7*  PLT 256 236 235   Cardiac Enzymes:  Recent Labs Lab 08/22/13 0215  CKTOTAL 38  CKMB 1.1   BNP (last 3 results) No results found for this basename: PROBNP,  in the last 8760 hours CBG:  Recent Labs Lab 08/22/13 1233 08/22/13 1526 08/22/13 2036 08/23/13 0002 08/23/13 0350  GLUCAP 161* 187* 127* 170* 142*    Recent Results (from the past 240 hour(s))  CULTURE, BLOOD (ROUTINE X 2)     Status: None   Collection Time    08/21/13  4:50 PM      Result Value Ref Range Status   Specimen Description BLOOD RIGHT ARM   Final   Special Requests     Final   Value: Normal BOTTLES DRAWN AEROBIC AND ANAEROBIC [facility] EACH   Culture  Setup Time     Final   Value: 08/21/2013 20:21     Performed at [facility]   Culture     Final   Value:        BLOOD CULTURE RECEIVED NO GROWTH TO DATE CULTURE WILL BE HELD FOR 5 DAYS BEFORE ISSUING A FINAL NEGATIVE REPORT     Performed at [facility]   Report Status PENDING   Incomplete  CULTURE, BLOOD (ROUTINE X 2)     Status: None   Collection Time    08/21/13  5:05 PM      Result Value Ref Range Status   Specimen Description BLOOD LEFT ARM   Final   Special Requests BOTTLES DRAWN AEROBIC AND ANAEROBIC [facility] EACH   Final   Culture  Setup Time     Final   Value: 08/21/2013 20:22     Performed at [facility]   Culture     Final   Value:        BLOOD CULTURE RECEIVED NO GROWTH TO DATE CULTURE WILL BE HELD FOR 5 DAYS BEFORE ISSUING A FINAL NEGATIVE REPORT     Performed at [facility]   Report Status PENDING   Incomplete  URINE  CULTURE     Status: None   Collection Time  08/21/13  6:00 PM      Result Value Ref Range Status   Specimen Description URINE, CLEAN CATCH   Final   Special Requests Normal   Final   Culture  Setup Time     Final   Value: 08/21/2013 22:57     Performed at [facility]     Final   Value: 30,000 COLONIES/ML     Performed at [facility]   Culture     Final   Value: Multiple bacterial morphotypes present, none predominant. Suggest appropriate recollection if clinically indicated.     Performed at [facility]   Report Status 08/22/2013 FINAL   Final  MRSA PCR SCREENING     Status: None   Collection Time    08/21/13  9:12 PM      Result Value Ref Range Status   MRSA by PCR NEGATIVE  NEGATIVE Final   Comment:            The GeneXpert MRSA Assay (FDA     approved for NASAL specimens     only), is one component of a     comprehensive MRSA colonization     surveillance program. It is not     intended to diagnose MRSA     infection nor to guide or     monitor treatment for     MRSA infections.     Studies: US Venous Img Lower Unilateral Right  08/21/2013   CLINICAL DATA:  Red hot area on the anterior and medial aspect of the right calf. Evaluate for deep venous thrombosis.  EXAM: RIGHT LOWER EXTREMITY VENOUS DOPPLER ULTRASOUND  TECHNIQUE: Gray-scale sonography with graded compression, as well as color Doppler and duplex ultrasound were performed to evaluate the lower extremity deep venous systems from the level of the common femoral vein and including the common femoral, femoral, profunda femoral, popliteal and calf veins including the posterior tibial, peroneal and gastrocnemius veins when visible. The superficial great saphenous vein was also interrogated. Spectral Doppler was utilized to evaluate flow at rest and with distal augmentation maneuvers in the common femoral, femoral and popliteal veins.  COMPARISON:  05/08/2013.  FINDINGS: Common  Femoral Vein: No evidence of thrombus. Normal compressibility, respiratory phasicity and response to augmentation.  Saphenofemoral Junction: No evidence of thrombus. Normal compressibility and flow on color Doppler imaging.  Profunda Femoral Vein: No evidence of thrombus. Normal compressibility and flow on color Doppler imaging.  Femoral Vein: No evidence of thrombus. Normal compressibility, respiratory phasicity and response to augmentation.  Popliteal Vein: No evidence of thrombus. Normal compressibility, respiratory phasicity and response to augmentation.  Calf Veins: No evidence of thrombus. Normal compressibility and flow on color Doppler imaging.  Superficial Great Saphenous Vein: No evidence of thrombus. Normal compressibility and flow on color Doppler imaging.  Venous Reflux:  None.  Other Findings:  None.  IMPRESSION: No evidence of right lower extremity deep venous thrombosis.   Electronically Signed   By: [name] M.D.   On: 08/21/2013 19:50    Scheduled Meds: . atorvastatin  10 mg Oral q1800  . gabapentin  300 mg Oral QHS  . heparin  5,000 Units Subcutaneous 3 times per day  . insulin aspart  0-20 Units Subcutaneous 6 times per day  . labetalol  200 mg Oral BID  .  morphine injection  6 mg Intravenous Once  . piperacillin-tazobactam (ZOSYN)  IV  3.375 g Intravenous Q8H  . vancomycin  1,250 mg Intravenous Q12H   Continuous Infusions: . sodium chloride 75 mL/hr at 08/22/13 1200    Active Problems:   Severe sepsis   Lactic acidosis   Dog bite of right forearm without complication    Time spent: 30 min    Ken Bonn, [place] [facility] Pager [phone]. If 7PM-7AM, please contact night-coverage at [email or web address], password [facility] 08/23/2013, 8:14 AM  LOS: 2 days

## 2013-08-23 NOTE — Progress Notes (Signed)
Patient complaining of nausea and chest pain and heaviness. VS as follows: T=99, BP=131/63 HR=119 O2 sat=97% on 2L. Paged and talked to Walden Field NP. Orders given and carried out. Will continue to monitor. KYoung RN

## 2013-08-24 DIAGNOSIS — Z5189 Encounter for other specified aftercare: Secondary | ICD-10-CM

## 2013-08-24 DIAGNOSIS — S51809A Unspecified open wound of unspecified forearm, initial encounter: Secondary | ICD-10-CM

## 2013-08-24 LAB — BASIC METABOLIC PANEL
Anion gap: 13 (ref 5–15)
BUN: 10 mg/dL (ref 6–23)
CALCIUM: 9.6 mg/dL (ref 8.4–10.5)
CHLORIDE: 101 meq/L (ref 96–112)
CO2: 24 meq/L (ref 19–32)
Creatinine, Ser: 0.58 mg/dL (ref 0.50–1.10)
GFR calc Af Amer: >90 mL/min (ref >90)
GFR calc non Af Amer: >90 mL/min (ref >90)
GLUCOSE: 157 mg/dL — AB (ref 70–99)
POTASSIUM: 4.1 meq/L (ref 3.7–5.3)
SODIUM: 138 meq/L (ref 137–147)

## 2013-08-24 LAB — GLUCOSE, CAPILLARY
GLUCOSE-CAPILLARY: 143 mg/dL — AB (ref 70–99)
GLUCOSE-CAPILLARY: 197 mg/dL — AB (ref 70–99)
Glucose-Capillary: 181 mg/dL — ABNORMAL HIGH (ref 70–99)
Glucose-Capillary: 203 mg/dL — ABNORMAL HIGH (ref 70–99)
Glucose-Capillary: 222 mg/dL — ABNORMAL HIGH (ref 70–99)
Glucose-Capillary: 242 mg/dL — ABNORMAL HIGH (ref 70–99)

## 2013-08-24 LAB — VANCOMYCIN, TROUGH: Vancomycin Tr: 7.1 ug/mL — ABNORMAL LOW (ref 10.0–20.0)

## 2013-08-24 MED ORDER — IBUPROFEN 800 MG PO TABS
800.0000 mg | ORAL_TABLET | Freq: Four times a day (QID) | ORAL | Status: DC | PRN
  Administered 2013-08-26: 800 mg via ORAL
  Filled 2013-08-24: qty 1

## 2013-08-24 MED ORDER — INSULIN ASPART 100 UNIT/ML ~~LOC~~ SOLN
0.0000 [IU] | Freq: Every day | SUBCUTANEOUS | Status: DC
  Administered 2013-08-24 – 2013-08-25 (×2): 2 [IU] via SUBCUTANEOUS

## 2013-08-24 MED ORDER — VANCOMYCIN HCL IN DEXTROSE 1-5 GM/200ML-% IV SOLN
1000.0000 mg | Freq: Three times a day (TID) | INTRAVENOUS | Status: DC
  Administered 2013-08-24 – 2013-08-26 (×7): 1000 mg via INTRAVENOUS
  Filled 2013-08-24 (×9): qty 200

## 2013-08-24 MED ORDER — INSULIN GLARGINE 100 UNIT/ML ~~LOC~~ SOLN
20.0000 [IU] | Freq: Every day | SUBCUTANEOUS | Status: DC
  Administered 2013-08-24: 20 [IU] via SUBCUTANEOUS
  Filled 2013-08-24 (×2): qty 0.2

## 2013-08-24 MED ORDER — INSULIN ASPART 100 UNIT/ML ~~LOC~~ SOLN
0.0000 [IU] | Freq: Three times a day (TID) | SUBCUTANEOUS | Status: DC
  Administered 2013-08-24: 7 [IU] via SUBCUTANEOUS
  Administered 2013-08-24: 4 [IU] via SUBCUTANEOUS
  Administered 2013-08-25 (×2): 11 [IU] via SUBCUTANEOUS
  Administered 2013-08-25 – 2013-08-26 (×2): 7 [IU] via SUBCUTANEOUS
  Administered 2013-08-26: 11 [IU] via SUBCUTANEOUS

## 2013-08-24 NOTE — Progress Notes (Signed)
[facility] PROGRESS NOTE  Margeart Allender NTZ:[id number] DOB: 10-Aug-1975 DOA: 08/21/2013 PCP: Idamae Schuller, MD  Assessment/Plan  Severe sepsis due to recurrent RLE cellulitis, now appears to be improving. Fevers and tachycardia resolving, cellulitis improving -  Continue vancomycin and Zosyn, day 3 -  If continuing to have significant clinical improvement, consider transitioning to oral abx tomorrow -  Continue lower extremity elevation  Dog bite, no evidence of infection -  [facility] to ask them to observe the dog for signs of rabies -  Tetanus toxoid at time of discharge  Chest pressure, improving -  Tele:  NSR, no arrhythmias  -  Okay to d/c telemetry -  Troponins neg -  CXR without acute disease  Sinus tachycardia, resolved after restarting labetalol -  Defer TSH for now due to severity of acute illness  Asthma without cough but with reduced breath sounds bilaterally -  Schedule xopenex -  D/c albuterol  Obesity, being counseled and followed by [facility] End[facility].    Diabetes mellitus type 2, being counseled and followed by [facility] -  CBG well controlled on current SSI  Diet:  Diabetic Access:  None IVF:  Off Proph:  Heparin  Code Status:  Full Family Communication:  Patient and husband Disposition Plan:  Pending improvement in cellulitis, possibly home tomorrow   Consultants:   [facility] M.  Procedures:   Duplex right lower extremity  Antibiotics:   Vancomycin 8/13 >>  Zosyn 8/13 >>   HPI/Subjective:  CP resolved.  Continuing ot have sweats and nausea after eating, had not previously noticed that association.  Leg is less swollen adn painful and red.    Objective: Filed Vitals:   08/23/13 1700 08/23/13 2148 08/24/13 0208 08/24/13 0702  BP: 134/74 142/68 114/59 114/65  Pulse: 101 107 96 99  Temp:  98.5 F (36.9 C) 98.2 F (36.8 C) 98 F (36.7 C)  TempSrc:  Oral    Resp:  21 19 18   Height:       Weight:    134.875 kg (297 lb 5.5 oz)  SpO2:  95% 94% 96%    Intake/Output Summary (Last 24 hours) at 08/24/13 0942 Last data filed at 08/23/13 1300  Gross per 24 hour  Intake    240 ml  Output      0 ml  Net    240 ml   Filed Weights   08/22/13 0500 08/23/13 0637 08/24/13 0702  Weight: 132 kg (291 lb 0.1 oz) 134.129 kg (295 lb 11.2 oz) 134.875 kg (297 lb 5.5 oz)    Exam:   General:  WF, No acute distress  HEENT:  NCAT, MMM  Cardiovascular:  RRR, nl S1, S2 no mrg, 2+ pulses, warm extremities  Respiratory:  Diminished bilateral breath sounds, no wheezes, rhonchi, or focal rales, no increased WOB  Abdomen:   NABS, soft, NT/ND  MSK:   Normal tone and bulk,  trace left LEE.  1+ right lower extremity edema with receding pink erythema now several inches above the ankle, less warm to touch, persistent darkened area on the anterior right shin that does not appear necrotic. Small area located along the medial right thigh no longer pink  Neuro:  Grossly intact  Data Reviewed: Basic Metabolic Panel:  Recent Labs Lab 08/21/13 1650 08/21/13 2120 08/22/13 0215 08/23/13 0132 08/24/13 0815  NA 140  --  140 140 138  K 4.0  --  4.2 4.3 4.1  CL 101  --  106 103 101  CO2 24  --  20 26 24   GLUCOSE 293*  --  201* 176* 157*  BUN 22  --  20 10 10   CREATININE 0.80 0.86 0.71 0.81 0.58  CALCIUM 10.1  --  9.1 9.6 9.6  MG  --   --  1.9 2.2  --   PHOS  --   --  3.6 3.1  --    Liver Function Tests:  Recent Labs Lab 08/22/13 0215  AST 16  ALT 24  ALKPHOS 37*  BILITOT 0.4  PROT 5.9*  ALBUMIN 3.4*   No results found for this basename: LIPASE, AMYLASE,  in the last 168 hours No results found for this basename: AMMONIA,  in the last 168 hours CBC:  Recent Labs Lab 08/21/13 1650 08/21/13 2120 08/22/13 0215  WBC 17.1* 20.0* 20.5*  HGB 14.5 13.1 13.4  HCT 42.7 41.3 41.7  MCV 101.7* 102.5* 103.7*  PLT 256 236 235   Cardiac Enzymes:  Recent Labs Lab 08/22/13 0215  08/23/13 0937 08/23/13 1405 08/23/13 2045  CKTOTAL 38  --   --   --   CKMB 1.1  --   --   --   TROPONINI  --  <0.30 <0.30 <0.30   BNP (last 3 results) No results found for this basename: PROBNP,  in the last 8760 hours CBG:  Recent Labs Lab 08/23/13 1646 08/23/13 1948 08/24/13 0008 08/24/13 0413 08/24/13 0757  GLUCAP 177* 203* 181* 203* 143*    Recent Results (from the past 240 hour(s))  CULTURE, BLOOD (ROUTINE X 2)     Status: None   Collection Time    08/21/13  4:50 PM      Result Value Ref Range Status   Specimen Description BLOOD RIGHT ARM   Final   Special Requests     Final   Value: Normal BOTTLES DRAWN AEROBIC AND ANAEROBIC [facility] EACH   Culture  Setup Time     Final   Value: 08/21/2013 20:21     Performed at [facility]   Culture     Final   Value:        BLOOD CULTURE RECEIVED NO GROWTH TO DATE CULTURE WILL BE HELD FOR 5 DAYS BEFORE ISSUING A FINAL NEGATIVE REPORT     Performed at [facility]   Report Status PENDING   Incomplete  CULTURE, BLOOD (ROUTINE X 2)     Status: None   Collection Time    08/21/13  5:05 PM      Result Value Ref Range Status   Specimen Description BLOOD LEFT ARM   Final   Special Requests BOTTLES DRAWN AEROBIC AND ANAEROBIC [place]   Final   Culture  Setup Time     Final   Value: 08/21/2013 20:22     Performed at [facility]   Culture     Final   Value:        BLOOD CULTURE RECEIVED NO GROWTH TO DATE CULTURE WILL BE HELD FOR 5 DAYS BEFORE ISSUING A FINAL NEGATIVE REPORT     Performed at [facility]   Report Status PENDING   Incomplete  URINE CULTURE     Status: None   Collection Time    08/21/13  6:00 PM      Result Value Ref Range Status   Specimen Description URINE, CLEAN CATCH   Final   Special Requests Normal   Final   Culture  Setup Time  Final   Value: 08/21/2013 22:57     Performed at [place]     Final   Value: 30,000 COLONIES/ML     Performed at [facility]   Culture     Final   Value: Multiple bacterial morphotypes present, none predominant. Suggest appropriate recollection if clinically indicated.     Performed at [facility]   Report Status 08/22/2013 FINAL   Final  MRSA PCR SCREENING     Status: None   Collection Time    08/21/13  9:12 PM      Result Value Ref Range Status   MRSA by PCR NEGATIVE  NEGATIVE Final   Comment:            The GeneXpert MRSA Assay (FDA     approved for NASAL specimens     only), is one component of a     comprehensive MRSA colonization     surveillance program. It is not     intended to diagnose MRSA     infection nor to guide or     monitor treatment for     MRSA infections.     Studies: Dg Chest 2 View  08/23/2013   CLINICAL DATA:  Chest pressure and hypertension.  EXAM: CHEST  2 VIEW  COMPARISON:  06/28/2012  FINDINGS: Midline trachea. Borderline cardiomegaly. Mediastinal contours otherwise within normal limits. No pleural effusion or pneumothorax. Clear lungs.  IMPRESSION: Borderline cardiomegaly, without acute disease.   Electronically Signed   By: Abigail Miyamoto M.D.   On: 08/23/2013 09:44    Scheduled Meds: . atorvastatin  10 mg Oral q1800  . gabapentin  300 mg Oral QHS  . heparin  5,000 Units Subcutaneous 3 times per day  . insulin aspart  0-20 Units Subcutaneous 6 times per day  . labetalol  200 mg Oral BID  .  morphine injection  6 mg Intravenous Once  . piperacillin-tazobactam (ZOSYN)  IV  3.375 g Intravenous Q8H  . vancomycin  1,250 mg Intravenous Q12H   Continuous Infusions:    Active Problems:   Severe sepsis   Lactic acidosis   Dog bite of right forearm without complication    Time spent: 30 min    Latrish Mogel, [place] [facility] Pager [phone]. If 7PM-7AM, please contact night-coverage at [email or web address], password [facility] 08/24/2013, 9:42 AM  LOS: 3 days

## 2013-08-24 NOTE — Progress Notes (Signed)
ANTIBIOTIC CONSULT NOTE  Pharmacy Consult:  Vancomycin / Zosyn Indication:  Sepsis from cellulitis  Allergies  Allergen Reactions  . Antibacterial Hand Soap [Triclosan] Other (See Comments)    Dry, itchy patches on skin    Patient Measurements: Height: 5\' 4"  (162.6 cm) Weight: 297 lb 5.5 oz (134.875 kg) IBW/kg (Calculated) : 54.7  Vital Signs: Temp: 98 F (36.7 C) (08/16 0702) BP: 114/65 mmHg (08/16 0702) Pulse Rate: 99 (08/16 0702)  Labs:  Recent Labs  08/21/13 1650 08/21/13 2120 08/22/13 0215 08/23/13 0132 08/24/13 0815  WBC 17.1* 20.0* 20.5*  --   --   HGB 14.5 13.1 13.4  --   --   PLT 256 236 235  --   --   CREATININE 0.80 0.86 0.71 0.81 0.58   Estimated Creatinine Clearance: 129.4 ml/min (by C-G formula based on Cr of 0.58).  Recent Labs  08/24/13 0815  [place] 7.1*     Microbiology: Recent Results (from the past 720 hour(s))  CULTURE, BLOOD (ROUTINE X 2)     Status: None   Collection Time    08/21/13  4:50 PM      Result Value Ref Range Status   Specimen Description BLOOD RIGHT ARM   Final   Special Requests     Final   Value: Normal BOTTLES DRAWN AEROBIC AND ANAEROBIC [facility] EACH   Culture  Setup Time     Final   Value: 08/21/2013 20:21     Performed at [facility]   Culture     Final   Value:        BLOOD CULTURE RECEIVED NO GROWTH TO DATE CULTURE WILL BE HELD FOR 5 DAYS BEFORE ISSUING A FINAL NEGATIVE REPORT     Performed at [facility]   Report Status PENDING   Incomplete  CULTURE, BLOOD (ROUTINE X 2)     Status: None   Collection Time    08/21/13  5:05 PM      Result Value Ref Range Status   Specimen Description BLOOD LEFT ARM   Final   Special Requests BOTTLES DRAWN AEROBIC AND ANAEROBIC [facility] EACH   Final   Culture  Setup Time     Final   Value: 08/21/2013 20:22     Performed at [facility]   Culture     Final   Value:        BLOOD CULTURE RECEIVED NO GROWTH TO DATE CULTURE WILL BE HELD FOR 5 DAYS BEFORE  ISSUING A FINAL NEGATIVE REPORT     Performed at [facility]   Report Status PENDING   Incomplete  URINE CULTURE     Status: None   Collection Time    08/21/13  6:00 PM      Result Value Ref Range Status   Specimen Description URINE, CLEAN CATCH   Final   Special Requests Normal   Final   Culture  Setup Time     Final   Value: 08/21/2013 22:57     Performed at [place]     Final   Value: 30,000 COLONIES/ML     Performed at [facility]   Culture     Final   Value: Multiple bacterial morphotypes present, none predominant. Suggest appropriate recollection if clinically indicated.     Performed at [facility]   Report Status 08/22/2013 FINAL   Final  MRSA PCR SCREENING     Status: None  Collection Time    08/21/13  9:12 PM      Result Value Ref Range Status   MRSA by PCR NEGATIVE  NEGATIVE Final   Comment:            The GeneXpert MRSA Assay (FDA     approved for NASAL specimens     only), is one component of a     comprehensive MRSA colonization     surveillance program. It is not     intended to diagnose MRSA     infection nor to guide or     monitor treatment for     MRSA infections.   Assessment: 64 obese female with recurrent right leg cellulitis, found to have severe sepsis and transferred to [facility]. She continues on vancomycin and Zosyn- MD noted improvement in cellulitis this morning. A vancomycin trough drawn this morning was low at 7.53mcg/mL. No doses of vancomycin have been missed. SCr improved to 0.58 this morning.  Noted MD plan to switch to PO antibiotics tomorrow if she continues to improve. WBC were 20.5 on 8/14, she remains afebrile.  Goal of Therapy:  Vancomycin trough level 15-20 mcg/ml  Plan:  1. Change  Vancomycin to 1000mg  IV q8h 2. Zosyn 3.375gm IV Q8H, 4 hr infusion 3. Monitor renal fxn, clinical course, LOT  Kaylany Tesoriero D. Orlin Kann, PharmD, BCPS Clinical Pharmacist Pager: [phone] 08/24/2013 10:51  AM

## 2013-08-25 LAB — CBC
HEMATOCRIT: 38.4 % (ref 36.0–46.0)
Hemoglobin: 12.3 g/dL (ref 12.0–15.0)
MCH: 33.1 pg (ref 26.0–34.0)
MCHC: 32 g/dL (ref 30.0–36.0)
MCV: 103.2 fL — AB (ref 78.0–100.0)
PLATELETS: 253 10*3/uL (ref 150–400)
RBC: 3.72 MIL/uL — ABNORMAL LOW (ref 3.87–5.11)
RDW: 13.7 % (ref 11.5–15.5)
WBC: 8.7 10*3/uL (ref 4.0–10.5)

## 2013-08-25 LAB — GLUCOSE, CAPILLARY
GLUCOSE-CAPILLARY: 210 mg/dL — AB (ref 70–99)
GLUCOSE-CAPILLARY: 261 mg/dL — AB (ref 70–99)
Glucose-Capillary: 297 mg/dL — ABNORMAL HIGH (ref 70–99)
Glucose-Capillary: 236 mg/dL — ABNORMAL HIGH (ref 70–99)

## 2013-08-25 LAB — BASIC METABOLIC PANEL
Anion gap: 14 (ref 5–15)
BUN: 10 mg/dL (ref 6–23)
CALCIUM: 9.3 mg/dL (ref 8.4–10.5)
CO2: 25 meq/L (ref 19–32)
Chloride: 103 mEq/L (ref 96–112)
Creatinine, Ser: 0.56 mg/dL (ref 0.50–1.10)
GFR calc Af Amer: >90 mL/min (ref >90)
GLUCOSE: 231 mg/dL — AB (ref 70–99)
Potassium: 4.1 mEq/L (ref 3.7–5.3)
Sodium: 142 mEq/L (ref 137–147)

## 2013-08-25 MED ORDER — INSULIN GLARGINE 100 UNIT/ML ~~LOC~~ SOLN
30.0000 [IU] | Freq: Every day | SUBCUTANEOUS | Status: DC
  Administered 2013-08-25: 30 [IU] via SUBCUTANEOUS
  Filled 2013-08-25 (×2): qty 0.3

## 2013-08-25 NOTE — Progress Notes (Addendum)
[facility] PROGRESS NOTE  Kelli Allen YTK:[id number] DOB: [birth year] DOA: 08/21/2013 PCP: Idamae Schuller, MD  Assessment/Plan  Severe sepsis due to recurrent RLE cellulitis, now appears to be improving. Fevers and tachycardia resolving, cellulitis improving.  Anterior shin is  -  Continue vancomycin and Zosyn, day 4 -  If continuing to have significant clinical improvement, consider transitioning to oral abx tomorrow -  Continue lower extremity elevation  Dog bite, no evidence of infection -  [facility] was called to ask them to observe the dog for signs of rabies -  Tetanus toxoid at time of discharge  Chest pressure, improving -  Tele:  NSR, no arrhythmias  -  Okay to d/c telemetry -  Troponins neg -  CXR without acute disease  Sinus tachycardia, resolved after restarting labetalol -  Defer TSH for now due to severity of acute illness  Asthma without cough but with reduced breath sounds bilaterally -  Schedule xopenex -  D/c albuterol  Obesity, being counseled and followed by [facility]  Diabetes mellitus type 2, uncontrolled, A1c 8.2, being counseled and followed by [facility] -  CBG well controlled on current SSI -  Increase to lantus 30 units   Leukocytosis, resolved on abx  Diet:  Diabetic Access:  None IVF:  Off Proph:  Heparin  Code Status:  Full Family Communication:  Patient and husband Disposition Plan:  Pending improvement in cellulitis, possibly home tomorrow   Consultants:   [facility].  Procedures:   Duplex right lower extremity  Antibiotics:   Vancomycin 8/13 >>   Zosyn 8/13 >>   HPI/Subjective:  Leg got very swollen last night so she has been elevating it and now the swelling and redness are better.    Objective: Filed Vitals:   08/24/13 0702 08/24/13 1450 08/24/13 2215 08/25/13 0523  BP: 114/65 145/74 136/76 124/68  Pulse: 99 89 89 79  Temp: 98 F (36.7 C) 98 F (36.7 C) 98.2 F  (36.8 C) 97.6 F (36.4 C)  TempSrc:  Oral Oral   Resp: 18 18 18 18   Height:      Weight: 134.875 kg (297 lb 5.5 oz)   134.587 kg (296 lb 11.4 oz)  SpO2: 96% 95% 94% 97%    Intake/Output Summary (Last 24 hours) at 08/25/13 1140 Last data filed at 08/25/13 0549  Gross per 24 hour  Intake    890 ml  Output      0 ml  Net    890 ml   Filed Weights   08/23/13 0637 08/24/13 0702 08/25/13 0523  Weight: 134.129 kg (295 lb 11.2 oz) 134.875 kg (297 lb 5.5 oz) 134.587 kg (296 lb 11.4 oz)    Exam:   General:  WF, No acute distress  HEENT:  NCAT, MMM  Cardiovascular:  RRR, nl S1, S2 no mrg, 2+ pulses, warm extremities  Respiratory:  Diminished bilateral breath sounds, no wheezes, rhonchi, or focal rales, no increased WOB  Abdomen:   NABS, soft, NT/ND  MSK:   Normal tone and bulk,  trace left LEE.  Reverse S-shaped area on anterior shin that is purplish, but not discreet area of duskiness.  1+ right lower extremity edema with receding pink erythema.  Small area located along the medial right thigh no longer pink  Neuro:  Grossly intact  Data Reviewed: Basic Metabolic Panel:  Recent Labs Lab 08/21/13 1650 08/21/13 2120 08/22/13 0215 08/23/13 0132 08/24/13 0815 08/25/13 0631  NA 140  --  140 140 138 142  K 4.0  --  4.2 4.3 4.1 4.1  CL 101  --  106 103 101 103  CO2 24  --  20 26 24 25   GLUCOSE 293*  --  201* 176* 157* 231*  BUN 22  --  20 10 10 10   CREATININE 0.80 0.86 0.71 0.81 0.58 0.56  CALCIUM 10.1  --  9.1 9.6 9.6 9.3  MG  --   --  1.9 2.2  --   --   PHOS  --   --  3.6 3.1  --   --    Liver Function Tests:  Recent Labs Lab 08/22/13 0215  AST 16  ALT 24  ALKPHOS 37*  BILITOT 0.4  PROT 5.9*  ALBUMIN 3.4*   No results found for this basename: LIPASE, AMYLASE,  in the last 168 hours No results found for this basename: AMMONIA,  in the last 168 hours CBC:  Recent Labs Lab 08/21/13 1650 08/21/13 2120 08/22/13 0215 08/25/13 0631  WBC 17.1* 20.0* 20.5*  8.7  HGB 14.5 13.1 13.4 12.3  HCT 42.7 41.3 41.7 38.4  MCV 101.7* 102.5* 103.7* 103.2*  PLT 256 236 235 253   Cardiac Enzymes:  Recent Labs Lab 08/22/13 0215 08/23/13 0937 08/23/13 1405 08/23/13 2045  CKTOTAL 38  --   --   --   CKMB 1.1  --   --   --   TROPONINI  --  <0.30 <0.30 <0.30   BNP (last 3 results) No results found for this basename: PROBNP,  in the last 8760 hours CBG:  Recent Labs Lab 08/24/13 0757 08/24/13 1133 08/24/13 1627 08/24/13 2201 08/25/13 0741  GLUCAP 143* 197* 222* 242* 210*    Recent Results (from the past 240 hour(s))  CULTURE, BLOOD (ROUTINE X 2)     Status: None   Collection Time    08/21/13  4:50 PM      Result Value Ref Range Status   Specimen Description BLOOD RIGHT ARM   Final   Special Requests     Final   Value: Normal BOTTLES DRAWN AEROBIC AND ANAEROBIC [facility] EACH   Culture  Setup Time     Final   Value: 08/21/2013 20:21     Performed at [facility]   Culture     Final   Value:        BLOOD CULTURE RECEIVED NO GROWTH TO DATE CULTURE WILL BE HELD FOR 5 DAYS BEFORE ISSUING A FINAL NEGATIVE REPORT     Performed at [facility]   Report Status PENDING   Incomplete  CULTURE, BLOOD (ROUTINE X 2)     Status: None   Collection Time    08/21/13  5:05 PM      Result Value Ref Range Status   Specimen Description BLOOD LEFT ARM   Final   Special Requests BOTTLES DRAWN AEROBIC AND ANAEROBIC [facility] EACH   Final   Culture  Setup Time     Final   Value: 08/21/2013 20:22     Performed at [facility]   Culture     Final   Value:        BLOOD CULTURE RECEIVED NO GROWTH TO DATE CULTURE WILL BE HELD FOR 5 DAYS BEFORE ISSUING A FINAL NEGATIVE REPORT     Performed at [facility]   Report Status PENDING   Incomplete  URINE CULTURE     Status: None   Collection Time  08/21/13  6:00 PM      Result Value Ref Range Status   Specimen Description URINE, CLEAN CATCH   Final   Special Requests Normal   Final   Culture   Setup Time     Final   Value: 08/21/2013 22:57     Performed at [facility]     Final   Value: 30,000 COLONIES/ML     Performed at [facility]   Culture     Final   Value: Multiple bacterial morphotypes present, none predominant. Suggest appropriate recollection if clinically indicated.     Performed at [facility]   Report Status 08/22/2013 FINAL   Final  MRSA PCR SCREENING     Status: None   Collection Time    08/21/13  9:12 PM      Result Value Ref Range Status   MRSA by PCR NEGATIVE  NEGATIVE Final   Comment:            The GeneXpert MRSA Assay (FDA     approved for NASAL specimens     only), is one component of a     comprehensive MRSA colonization     surveillance program. It is not     intended to diagnose MRSA     infection nor to guide or     monitor treatment for     MRSA infections.     Studies: No results found.  Scheduled Meds: . atorvastatin  10 mg Oral q1800  . gabapentin  300 mg Oral QHS  . heparin  5,000 Units Subcutaneous 3 times per day  . insulin aspart  0-20 Units Subcutaneous TID WC  . insulin aspart  0-5 Units Subcutaneous QHS  . insulin glargine  30 Units Subcutaneous QHS  . labetalol  200 mg Oral BID  .  morphine injection  6 mg Intravenous Once  . piperacillin-tazobactam (ZOSYN)  IV  3.375 g Intravenous Q8H  . vancomycin  1,000 mg Intravenous Q8H   Continuous Infusions:    Active Problems:   Severe sepsis   Lactic acidosis   Dog bite of right forearm without complication    Time spent: 30 min    Hilmer Aliberti, [facility] Pager [phone]. If 7PM-7AM, please contact night-coverage at [email or web address], password [facility] 08/25/2013, 11:40 AM  LOS: 4 days

## 2013-08-25 NOTE — Progress Notes (Signed)
Pt complains of a noted new knot in RT upper leg. Pt concerned of DVT. Requested to have "d-dimer and [place] to r/o DVT. DrKilby paged. Informed to reassure pt that heparin is being used and to keep leg elevated for the night. Pt informed and compliant.

## 2013-08-25 NOTE — Clinical Documentation Improvement (Signed)
Possible Clinical Conditions?   Diabetes Type 2: _______Controlled or Uncontrolled  Manifestations:  _______DM retinopathy  _______DM PVD _______DM neuropathy   _______DM nephropathy  Associated conditions: _______DM cellulitis _______DM gangrene _______DM osteomyelitis _______DM skin ulcer  _______Other Condition _______Cannot Clinically determine    Risk Factors: Patient with a history of diabetes mellitus type II per 8/13 progress notes.  Diagnostics: 8/15:  HgbA1c: 8.2. 8/15:  Mean plasma glucose: 189. 8/15:  Glucose, blood: 176.  Thank You, Theron Arista, Clinical Documentation Specialist:  [phone]  [place] Information Management

## 2013-08-26 DIAGNOSIS — IMO0001 Reserved for inherently not codable concepts without codable children: Secondary | ICD-10-CM

## 2013-08-26 DIAGNOSIS — E1165 Type 2 diabetes mellitus with hyperglycemia: Secondary | ICD-10-CM

## 2013-08-26 LAB — GLUCOSE, CAPILLARY
Glucose-Capillary: 267 mg/dL — ABNORMAL HIGH (ref 70–99)
Glucose-Capillary: 243 mg/dL — ABNORMAL HIGH (ref 70–99)

## 2013-08-26 MED ORDER — CLINDAMYCIN HCL 300 MG PO CAPS: 300.0000 mg | ORAL_CAPSULE | Freq: Four times a day (QID) | ORAL | Status: DC

## 2013-08-26 MED ORDER — SACCHAROMYCES BOULARDII 250 MG PO CAPS: 250.0000 mg | ORAL_CAPSULE | Freq: Two times a day (BID) | ORAL | Status: DC

## 2013-08-26 NOTE — Discharge Summary (Signed)
Physician Discharge Summary  Kelli Allen CVE:[id number] DOB: 1975/08/07 DOA: 08/21/2013  PCP: Idamae Schuller, MD  Admit date: 08/21/2013 Discharge date: 08/26/2013  Recommendations for Outpatient Follow-up:  1.  Increase to lantus 50 units and 20 units AC tomorrow.  If blood sugars still elevated, anticipate resuming home insulin regimen following day 2.  Patient instructed to contact Endocrinology office tomorrow 3.  Personally discussed puppy with staff at [employer] today and they stated the puppy had not had any signs of rabies, was too young for rabies vaccine, and had not had any exposure to wild animals.   4.  Tetanus vaccine given prior to discharge 5.  Clindamycin with florastor.  Given how rapidly her infection progressed, most likely this is Strep or Staph infection.  Given instructions to contact general surgery office if she notices areas of necrosis or her PCP's office if her cellulitis worsens.    Discharge Diagnoses:  Principal Problem:   Severe sepsis due to cellulitis/erysipelas of lower extremity Active Problems:   Diabetes mellitus type 2, uncontrolled, without complications   HTN (hypertension)   Lactic acidosis   Dog bite of right forearm without complication   Erysipelas of lower extremity   Discharge Condition: stable, improved  Diet recommendation: diabetic  Wt Readings from Last 3 Encounters:  08/26/13 132.087 kg (291 lb 3.2 oz)  06/06/13 133.085 kg (293 lb 6.4 oz)  02/26/13 130.772 kg (288 lb 4.8 oz)    History of present illness:   38 yo F with diabetes mellitus, PCOS, HTN, recurrent cellulitis who presented with rapidly spreading pain and erythema of the right leg.  She also had a recent bite by a puppy in a pet store day prior to admission.  In ER, BP 86/48, WBC 17, lactate 4.16. PCCM was consulted for request to transfer to [facility] for further evaluation. ADmitted 08/21/2013.  Duplex on date of admission negative for DVT.     Hospital  Course:   Severe sepsis due to recurrent RLE cellulitis.  Her blood pressure improved with IVF and she did not require vasopressors.  She was started on empiric vancomycin and zosyn and her cellulitis, fevers and tachycardia resolved.  She completed almost 5 days of IV antibiotics while in the hospital.  Given how rapidly her cellulitis progressed, she most likely had Strep or Staph infection (erysipelas), possibly with toxin production given her hypotension.  Diabetes is moderately controlled so doubt pseudomonas.  Recommend she complete a total of 10 days of antibiotics with clindamycin.  She has a discreet area of dark red on her anterior shin with a subtle purplish hugh centrally but no areas of necrosis, however, she was given information for the general surgery clinic if she does notice some areas of necrosis forming in the next week.  She should talk to her primary care doctor immediately or return to the hospital if she has worsening cellulitis.  Continue elevation of extremity.    Dog bite, no evidence of infection.  Dog without evidence of rabies.  Tetanus toxoid at time of discharge.  Chest pressure was likely related to anxiety and IV fluids.  ECG without ischemic changes.  Tele: NSR, no arrhythmias.  Troponins negative.  CXR without acute disease.  Sinus tachycardia, resolved after restarting labetalol.  Has history of tachycardia per records.  Deferred TSH for now due to severity of acute illness.    Asthma without cough but with reduced breath sounds bilaterally, stable.  Given xopenex and may resume home medications.  Obesity, being counseled and followed by [facility].   Diabetes mellitus type 2, uncontrolled, A1c 8.2, being counseled and followed by [facility].  She was very anxious about her blood sugars, particularly about hypoglycemia and was reluctant to have her insulin increased as she recovered from her sepsis.  Her CBGs have been very elevated (mid to  high 200s).  She is instructed to use SSI at home and lantus 50 units tonight, check CBG tomorrow and if they are still elevated, resume her previous insulin regimen.  She may resume her other home diabetes medications also.    Leukocytosis, resolved on abx  Consultants:  [facility] M. Procedures:  Duplex right lower extremity 08/21/13:  No evidence of DVT Antibiotics:  Vancomycin 8/13 >>  Zosyn 8/13 >>    Discharge Exam: Filed Vitals:   08/26/13 1348  BP: 138/73  Pulse: 88  Temp: 97.6 F (36.4 C)  Resp: 18   Filed Vitals:   08/25/13 2119 08/26/13 0514 08/26/13 0525 08/26/13 1348  BP: 129/73  138/76 138/73  Pulse: 97  82 88  Temp: 97.9 F (36.6 C)  97.5 F (36.4 C) 97.6 F (36.4 C)  TempSrc: Oral  Oral Oral  Resp: 20  20 18   Height:      Weight:  132.087 kg (291 lb 3.2 oz)    SpO2: 95%  95% 97%    General: WF, No acute distress  HEENT: NCAT, MMM  Cardiovascular: RRR, nl S1, S2 no mrg, 2+ pulses, warm extremities  Respiratory: Diminished bilateral breath sounds, no wheezes, rhonchi, or focal rales, no increased WOB  Abdomen: NABS, soft, NT/ND  MSK: Normal tone and bulk, trace left LEE. discreet area of dark red, hand-sized, on her anterior shin with a subtle purplish hugh centrally but no areas of necrosis.  Surrounding skin with markedly improved injection, induration, and erythema. Small area located along the medial right thigh no longer pink at all.   Neuro: Grossly intact   Discharge Instructions      Discharge Instructions   Call MD for:  difficulty breathing, headache or visual disturbances    Complete by:  As directed      Call MD for:  extreme fatigue    Complete by:  As directed      Call MD for:  hives    Complete by:  As directed      Call MD for:  persistant dizziness or light-headedness    Complete by:  As directed      Call MD for:  persistant nausea and vomiting    Complete by:  As directed      Call MD for:  severe uncontrolled pain    Complete by:   As directed      Call MD for:  temperature >100.4    Complete by:  As directed      Diet Carb Modified    Complete by:  As directed      Discharge instructions    Complete by:  As directed   You were hospitalized with cellulitis of the right leg, probably erysipelas.  I have included information about this condition in your discharge instructions.  Please take clindamycin four times a day for the next 5 days until all your tabs are gone.  Clindamycin and prolonged exposure to antibiotics increases your risk of infectious diarrhea.  C. Diff diarrhea causes very watery frequent stools oftentimes with incontinence of stool.  If you experience similar symptoms, please return to  the hospital right away.  Clindamycin also can soften your stools and make them more frequent just as a side effect, but should not cause water-like stools.  Please take florastor twice a day to reduce your risk of infectious diarrhea.  For your diabetes.  Please take lantus 50 units tonight and I have printed a sliding scale for you to use.  If your blood sugars are still in the 200s or higher on this regimen tomorrow, then the following day I would go back to your home insulin regimen.  Discuss your diabetes medications with your endocrinologist.     Increase activity slowly    Complete by:  As directed             Medication List         atorvastatin 10 MG tablet  Commonly known as:  LIPITOR  Take 10 mg by mouth every morning.     clindamycin 300 MG capsule  Commonly known as:  CLEOCIN  Take 1 capsule (300 mg total) by mouth 4 (four) times daily.     gabapentin 100 MG capsule  Commonly known as:  NEURONTIN  Take 300 mg by mouth at bedtime.     insulin aspart 100 UNIT/ML injection  Commonly known as:  novoLOG  Inject 35 Units into the skin 3 (three) times daily with meals.     insulin glargine 100 UNIT/ML injection  Commonly known as:  LANTUS  Inject 80 Units into the skin at bedtime.     INVOKANA 300 MG  Tabs  Generic drug:  Canagliflozin  Take by mouth every morning.     labetalol 200 MG tablet  Commonly known as:  NORMODYNE  Take 200 mg by mouth 2 (two) times daily.     lisinopril-hydrochlorothiazide 20-12.5 MG per tablet  Commonly known as:  PRINZIDE,ZESTORETIC  Take 1 tablet by mouth every morning.     metformin 500 MG (OSM) 24 hr tablet  Commonly known as:  FORTAMET  Take 1,000 mg by mouth 2 (two) times daily with a meal.     NON FORMULARY  Take 1 capsule by mouth 2 (two) times daily. nutralite omega 3     OVER THE COUNTER MEDICATION  Take 1 packet by mouth 2 (two) times daily. Nutralite multi  Vitamins     OVER THE COUNTER MEDICATION  - Take 1 tablet by mouth 2 (two) times daily. Glucose Health  - Cromium Piccolate     PROAIR HFA 108 (90 BASE) MCG/ACT inhaler  Generic drug:  albuterol  Inhale 2 puffs into the lungs every 4 (four) hours as needed for shortness of breath.     saccharomyces boulardii 250 MG capsule  Commonly known as:  FLORASTOR  Take 1 capsule (250 mg total) by mouth 2 (two) times daily.     VICTOZA 18 MG/3ML Soln injection  Generic drug:  Liraglutide  Inject 1.8 mg into the skin daily.       Follow-up Information   Follow up with [facility], MD. Schedule an appointment as soon as possible for a visit in 1 week. (reevaluate leg)    Specialty:  Internal Medicine   Contact information:   [address] [place] [address] [phone]       Follow up with [place]. (if you notice necrosis forming on your shin.)    Contact information:   [place] [address] [phone]      The results of  significant diagnostics from this hospitalization (including imaging, microbiology, ancillary and laboratory) are listed below for reference.    Significant Diagnostic Studies: Dg Chest 2 View  08/23/2013   CLINICAL DATA:  Chest pressure and hypertension.  EXAM: CHEST  2 VIEW   COMPARISON:  06/28/2012  FINDINGS: Midline trachea. Borderline cardiomegaly. Mediastinal contours otherwise within normal limits. No pleural effusion or pneumothorax. Clear lungs.  IMPRESSION: Borderline cardiomegaly, without acute disease.   Electronically Signed   By: Abigail Miyamoto M.D.   On: 08/23/2013 09:44   US Venous Img Lower Unilateral Right  08/21/2013   CLINICAL DATA:  Red hot area on the anterior and medial aspect of the right calf. Evaluate for deep venous thrombosis.  EXAM: RIGHT LOWER EXTREMITY VENOUS DOPPLER ULTRASOUND  TECHNIQUE: Gray-scale sonography with graded compression, as well as color Doppler and duplex ultrasound were performed to evaluate the lower extremity deep venous systems from the level of the common femoral vein and including the common femoral, femoral, profunda femoral, popliteal and calf veins including the posterior tibial, peroneal and gastrocnemius veins when visible. The superficial great saphenous vein was also interrogated. Spectral Doppler was utilized to evaluate flow at rest and with distal augmentation maneuvers in the common femoral, femoral and popliteal veins.  COMPARISON:  05/08/2013.  FINDINGS: Common Femoral Vein: No evidence of thrombus. Normal compressibility, respiratory phasicity and response to augmentation.  Saphenofemoral Junction: No evidence of thrombus. Normal compressibility and flow on color Doppler imaging.  Profunda Femoral Vein: No evidence of thrombus. Normal compressibility and flow on color Doppler imaging.  Femoral Vein: No evidence of thrombus. Normal compressibility, respiratory phasicity and response to augmentation.  Popliteal Vein: No evidence of thrombus. Normal compressibility, respiratory phasicity and response to augmentation.  Calf Veins: No evidence of thrombus. Normal compressibility and flow on color Doppler imaging.  Superficial Great Saphenous Vein: No evidence of thrombus. Normal compressibility and flow on color Doppler  imaging.  Venous Reflux:  None.  Other Findings:  None.  IMPRESSION: No evidence of right lower extremity deep venous thrombosis.   Electronically Signed   By: [name] M.D.   On: 08/21/2013 19:50    Microbiology: Recent Results (from the past 240 hour(s))  CULTURE, BLOOD (ROUTINE X 2)     Status: None   Collection Time    08/21/13  4:50 PM      Result Value Ref Range Status   Specimen Description BLOOD RIGHT ARM   Final   Special Requests     Final   Value: Normal BOTTLES DRAWN AEROBIC AND ANAEROBIC [facility] EACH   Culture  Setup Time     Final   Value: 08/21/2013 20:21     Performed at [facility]   Culture     Final   Value:        BLOOD CULTURE RECEIVED NO GROWTH TO DATE CULTURE WILL BE HELD FOR 5 DAYS BEFORE ISSUING A FINAL NEGATIVE REPORT     Performed at [facility]   Report Status PENDING   Incomplete  CULTURE, BLOOD (ROUTINE X 2)     Status: None   Collection Time    08/21/13  5:05 PM      Result Value Ref Range Status   Specimen Description BLOOD LEFT ARM   Final   Special Requests BOTTLES DRAWN AEROBIC AND ANAEROBIC [place]   Final   Culture  Setup Time     Final   Value: 08/21/2013 20:22     Performed  at [facility]     Final   Value:        BLOOD CULTURE RECEIVED NO GROWTH TO DATE CULTURE WILL BE HELD FOR 5 DAYS BEFORE ISSUING A FINAL NEGATIVE REPORT     Performed at [facility]   Report Status PENDING   Incomplete  URINE CULTURE     Status: None   Collection Time    08/21/13  6:00 PM      Result Value Ref Range Status   Specimen Description URINE, CLEAN CATCH   Final   Special Requests Normal   Final   Culture  Setup Time     Final   Value: 08/21/2013 22:57     Performed at [place]     Final   Value: 30,000 COLONIES/ML     Performed at [facility]   Culture     Final   Value: Multiple bacterial morphotypes present, none predominant. Suggest appropriate recollection if  clinically indicated.     Performed at [facility]   Report Status 08/22/2013 FINAL   Final  MRSA PCR SCREENING     Status: None   Collection Time    08/21/13  9:12 PM      Result Value Ref Range Status   MRSA by PCR NEGATIVE  NEGATIVE Final   Comment:            The GeneXpert MRSA Assay (FDA     approved for NASAL specimens     only), is one component of a     comprehensive MRSA colonization     surveillance program. It is not     intended to diagnose MRSA     infection nor to guide or     monitor treatment for     MRSA infections.     Labs: Basic Metabolic Panel:  Recent Labs Lab 08/21/13 1650 08/21/13 2120 08/22/13 0215 08/23/13 0132 08/24/13 0815 08/25/13 0631  NA 140  --  140 140 138 142  K 4.0  --  4.2 4.3 4.1 4.1  CL 101  --  106 103 101 103  CO2 24  --  20 26 24 25   GLUCOSE 293*  --  201* 176* 157* 231*  BUN 22  --  20 10 10 10   CREATININE 0.80 0.86 0.71 0.81 0.58 0.56  CALCIUM 10.1  --  9.1 9.6 9.6 9.3  MG  --   --  1.9 2.2  --   --   PHOS  --   --  3.6 3.1  --   --    Liver Function Tests:  Recent Labs Lab 08/22/13 0215  AST 16  ALT 24  ALKPHOS 37*  BILITOT 0.4  PROT 5.9*  ALBUMIN 3.4*   No results found for this basename: LIPASE, AMYLASE,  in the last 168 hours No results found for this basename: AMMONIA,  in the last 168 hours CBC:  Recent Labs Lab 08/21/13 1650 08/21/13 2120 08/22/13 0215 08/25/13 0631  WBC 17.1* 20.0* 20.5* 8.7  HGB 14.5 13.1 13.4 12.3  HCT 42.7 41.3 41.7 38.4  MCV 101.7* 102.5* 103.7* 103.2*  PLT 256 236 235 253   Cardiac Enzymes:  Recent Labs Lab 08/22/13 0215 08/23/13 0937 08/23/13 1405 08/23/13 2045  CKTOTAL 38  --   --   --   CKMB 1.1  --   --   --   TROPONINI  --  <  0.30 <0.30 <0.30   BNP: BNP (last 3 results) No results found for this basename: PROBNP,  in the last 8760 hours CBG:  Recent Labs Lab 08/25/13 1225 08/25/13 1638 08/25/13 2241 08/26/13 0816 08/26/13 1159  GLUCAP 261*  297* 236* 267* 243*    Time coordinating discharge: 45 minutes  Signed:  Miangel Flom  [facility] 08/26/2013, 3:13 PM

## 2013-08-27 LAB — CULTURE, BLOOD (ROUTINE X 2)
CULTURE: NO GROWTH
Culture: NO GROWTH
SPECIAL REQUESTS: NORMAL

## 2013-08-28 ENCOUNTER — Ambulatory Visit (INDEPENDENT_AMBULATORY_CARE_PROVIDER_SITE_OTHER): Payer: BC Managed Care – PPO | Admitting: Family Medicine

## 2013-08-28 VITALS — BP 170/90 | HR 95 | Temp 98.6°F | Resp 16 | Ht 64.75 in | Wt 288.4 lb

## 2013-08-28 DIAGNOSIS — L03119 Cellulitis of unspecified part of limb: Secondary | ICD-10-CM

## 2013-08-28 DIAGNOSIS — A419 Sepsis, unspecified organism: Secondary | ICD-10-CM

## 2013-08-28 DIAGNOSIS — IMO0001 Reserved for inherently not codable concepts without codable children: Secondary | ICD-10-CM

## 2013-08-28 DIAGNOSIS — E1165 Type 2 diabetes mellitus with hyperglycemia: Secondary | ICD-10-CM

## 2013-08-28 DIAGNOSIS — L03115 Cellulitis of right lower limb: Secondary | ICD-10-CM

## 2013-08-28 DIAGNOSIS — I10 Essential (primary) hypertension: Secondary | ICD-10-CM

## 2013-08-28 DIAGNOSIS — L02419 Cutaneous abscess of limb, unspecified: Secondary | ICD-10-CM

## 2013-08-28 DIAGNOSIS — M7989 Other specified soft tissue disorders: Secondary | ICD-10-CM

## 2013-08-28 LAB — POCT CBC
Granulocyte percent: 73.2 %G (ref 37–80)
HCT, POC: 43.2 % (ref 37.7–47.9)
HEMOGLOBIN: 13.6 g/dL (ref 12.2–16.2)
Lymph, poc: 2.2 (ref 0.6–3.4)
MCH: 31.9 pg — AB (ref 27–31.2)
MCHC: 31.4 g/dL — AB (ref 31.8–35.4)
MCV: 101.7 fL — AB (ref 80–97)
MID (CBC): 0.6 (ref 0–0.9)
MPV: 7.1 fL (ref 0–99.8)
PLATELET COUNT, POC: 387 10*3/uL (ref 142–424)
POC GRANULOCYTE: 7.5 — AB (ref 2–6.9)
POC LYMPH PERCENT: 21.3 %L (ref 10–50)
POC MID %: 5.5 % (ref 0–12)
RBC: 4.25 M/uL (ref 4.04–5.48)
RDW, POC: 15.5 %
WBC: 10.3 10*3/uL — AB (ref 4.6–10.2)

## 2013-08-28 LAB — GLUCOSE, POCT (MANUAL RESULT ENTRY): POC GLUCOSE: 135 mg/dL — AB (ref 70–99)

## 2013-08-28 MED ORDER — OXYCODONE-ACETAMINOPHEN 5-325 MG PO TABS: 1.0000 | ORAL_TABLET | ORAL | Status: DC | PRN

## 2013-08-28 MED ORDER — CLINDAMYCIN HCL 300 MG PO CAPS: 300.0000 mg | ORAL_CAPSULE | Freq: Four times a day (QID) | ORAL | Status: DC

## 2013-08-28 NOTE — Patient Instructions (Signed)
1.  INCREASE LANTUS TO 54 UNITS DAILY.   2. ELEVATE LEG AS MUCH AS POSSIBLE BUT AMBULATE FREQUENTLY TO THE RESTROOM.

## 2013-08-28 NOTE — Progress Notes (Signed)
Subjective:  This chart was scribed for Reginia Forts, MD by Mercy Moore, Medial Scribe. This patient was seen in room 13 and the patient's care was started at 8:05 PM.    Patient ID: Kelli Allen, female    DOB: August 12, 1975, 38 y.o.   MRN: [id number]  08/28/2013  Cellulitis and Hospitalization Follow-up   HPI HPI Comments: Kelli Allen is a 38 y.o. female who presents to the [facility] for hospital follow-up for RLE cellulitis, sepsis.  Patient is complaining of recurrent cellulitis to her right lower leg for 10 years. Tonight patient presents for follow up of right lower leg cellulitis and sepsis. Patient was admitted 8/13 - 8/18. She was just released two days ago. Patient has history of right lower extremity cellulitis. Treated with Vancomycin and zosyn for five days during admission and discharged on oral Clindamycin. Patient had a dog bite on her right arm that she suffered one day before onset of symptoms. The dog was not found to have rabies. She was given updated Tetanus during admission. Patient has history of Diabetes with elevated blood sugar in 200s during admission. Her blood cultures were negative x 2. MRSA screen was  negative. Hemoglobin A1C 8.2. Patient was tachycardic during admission as well. There she had a lower extremity doppler to rule out DVT. Patient hypotensive upon admission with blood pressure of 80/40 during admission and was admitted to ICU for management.   Tonight patient reports that this spring and summer she developed separate occurrences of rash/cellulitis to her left ankle and her groin/genital region which have since resolved. Her current episode is located at her right anterior lower leg. Patient reports improved redness since onset seven days ago. But as the day has progressed she reports appearance of white fluid in the lower center of the erythematous rash. Patient reports showering tonight; she states that this is her longest  instance of standing. She usually sits with her right leg elevated. Her recent standing could account for the swelling tonight. She reports that usually with standing she experiences immediate lower leg swelling. She denies any drainage. She reports increased pain in her leg since her release from the hospital. Patient was on IV morphine during admission and was not discharged with pain medication. Patient reports that the rash is painful to touch.   Additionally patient reports noticing "knots" in her right leg medially and proximally. She reports awareness that the "knots" could be her lymph nodes but she still expresses concern. She developed these knots on the night of discharge from the hospital while she was receiving Lovenox injections. She underwent LE doppler upon admission but she had not noticed these knots at admission.    Patient denies fevers, chills, sweats, or cramping pain behind her knee where she locates the nodules since her release from the hospital. She denies SOB or chest pain.    Upon discharge, hospitalist discharged her no 50units of Lantus daily instead of her usual 80 units of Lantus. She was also discharged on a SSI at meals.  Her sugars have been in the 200s since discharge except for yesterday when they finally were in the 100s.  Last night, pt ate [place] food and 1/2 a two liter of diet soda; her sugar last night two hours after eating did increase to the 300s; she took her SSI as advised. She is followed by endocrinology for diabetes; she is currently undergoing work up for [place].   Review of Systems  Constitutional: Negative  for fever, chills, diaphoresis and fatigue.  Respiratory: Negative for shortness of breath.   Cardiovascular: Positive for leg swelling. Negative for chest pain.  Gastrointestinal: Negative for nausea, vomiting and diarrhea.  Endocrine: Negative for polydipsia, polyphagia and polyuria.  Skin: Positive for color change and rash. Negative for  wound.    Past Medical History  Diagnosis Date  . Diabetes mellitus   . Cellulitis of lower leg     x3 on right leg  . Hypertension   . Irregular menstrual bleeding   . PCOS (polycystic ovarian syndrome)   . Tachycardia   . Abnormal Pap smear 2000  . Endometrial ca 12/20/2011  . Asthma     no issues in years  . Family history of anesthesia complication     sister had hard time waking up and problems breathing   Past Surgical History  Procedure Laterality Date  . Hysteroscopy w/d&c  01/24/2012    Procedure: DILATATION AND CURETTAGE /HYSTEROSCOPY;  Surgeon: Betsy Coder, MD;  Location: [facility];  Service: Gynecology;  Laterality: N/A;  with vulvar biopsies   . Intrauterine device (iud) insertion  01/24/2012    Procedure: INTRAUTERINE DEVICE (IUD) INSERTION;  Surgeon: Betsy Coder, MD;  Location: [facility];  Service: Gynecology;  Laterality: N/A;  . Dilation and curettage of uterus    . Robotic assisted total hysterectomy with bilateral salpingo oopherectomy Bilateral 07/02/2012    Procedure: ROBOTIC ASSISTED TOTAL HYSTERECTOMY WITH BILATERAL SALPINGO OOPHORECTOMY ;  Surgeon: Imagene Gurney A. Alycia Rossetti, MD;  Location: [facility];  Service: Gynecology;  Laterality: Bilateral;  . Abdominal hysterectomy     Allergies  Allergen Reactions  . Antibacterial Hand Soap [Triclosan] Other (See Comments)    Dry, itchy patches on skin   Current Outpatient Prescriptions  Medication Sig Dispense Refill  . atorvastatin (LIPITOR) 10 MG tablet Take 10 mg by mouth every morning.       . clindamycin (CLEOCIN) 300 MG capsule Take 1 capsule (300 mg total) by mouth 4 (four) times daily.  20 capsule  0  . gabapentin (NEURONTIN) 100 MG capsule Take 300 mg by mouth at bedtime.       . insulin aspart (NOVOLOG) 100 UNIT/ML injection Inject 35 Units into the skin 3 (three) times daily with meals.       . insulin glargine (LANTUS) 100 UNIT/ML injection Inject 80 Units into the skin at bedtime.      Marland Kitchen labetalol (NORMODYNE)  200 MG tablet Take 200 mg by mouth 2 (two) times daily.      . Liraglutide (VICTOZA) 18 MG/3ML SOLN Inject 1.8 mg into the skin daily.       Marland Kitchen lisinopril-hydrochlorothiazide (PRINZIDE,ZESTORETIC) 20-12.5 MG per tablet Take 1 tablet by mouth every morning.       . metformin (FORTAMET) 500 MG (OSM) 24 hr tablet Take 1,000 mg by mouth 2 (two) times daily with a meal.       . NON FORMULARY Take 1 capsule by mouth 2 (two) times daily. nutralite omega 3      . OVER THE COUNTER MEDICATION Take 1 packet by mouth 2 (two) times daily. Nutralite multi  Vitamins      . OVER THE COUNTER MEDICATION Take 1 tablet by mouth 2 (two) times daily. Glucose Health Cromium Piccolate      . PROAIR HFA 108 (90 BASE) MCG/ACT inhaler Inhale 2 puffs into the lungs every 4 (four) hours as needed for shortness of breath.       Marland Kitchen  saccharomyces boulardii (FLORASTOR) 250 MG capsule Take 1 capsule (250 mg total) by mouth 2 (two) times daily.  60 capsule  0  . Canagliflozin (INVOKANA) 300 MG TABS Take by mouth every morning.       Marland Kitchen oxyCODONE-acetaminophen (PERCOCET/ROXICET) 5-325 MG per tablet Take 1 tablet by mouth every 4 (four) hours as needed for severe pain.  30 tablet  0   No current facility-administered medications for this visit.       Objective:    BP 170/90  Pulse 95  Temp(Src) 98.6 F (37 C) (Oral)  Resp 16  Ht 5' 4.75" (1.645 m)  Wt 288 lb 6.4 oz (130.817 kg)  BMI 48.34 kg/m2  SpO2 97%  LMP 01/17/2012  Physical Exam  Nursing note and vitals reviewed. Constitutional: She is oriented to person, place, and time. She appears well-developed and well-nourished. No distress.  Obese.  HENT:  Head: Normocephalic and atraumatic.  Eyes: EOM are normal.  Neck: Neck supple.  Cardiovascular: Normal rate, regular rhythm, normal heart sounds and intact distal pulses.  Exam reveals no gallop and no friction rub.   No murmur heard. 1+ pitting and non-pitting edema to R>L leg.    Pulmonary/Chest: Effort normal and  breath sounds normal. She has no wheezes. She has no rales.  Musculoskeletal: Normal range of motion. She exhibits tenderness.  Nodular subcutaneous densities in the right medial leg.    Neurological: She is alert and oriented to person, place, and time.  Skin: Skin is warm and dry. Rash noted. She is not diaphoretic. There is erythema.  Right lower extremity with 12x10cm area of deep, red erythema with lateral inferior aspect of erythema with superficial yellow, clear fluid collection. Erythema is warm to touch. No streaking.  Psychiatric: She has a normal mood and affect. Her behavior is normal.    Results for orders placed in visit on 08/28/13  D-DIMER, QUANTITATIVE      Result Value Ref Range   D-Dimer, Quant 0.84 (*) 0.00 - 0.48 ug/mL-FEU  POCT CBC      Result Value Ref Range   WBC 10.3 (*) 4.6 - 10.2 K/uL   Lymph, poc 2.2  0.6 - 3.4   POC LYMPH PERCENT 21.3  10 - 50 %L   MID (cbc) 0.6  0 - 0.9   POC MID % 5.5  0 - 12 %M   POC Granulocyte 7.5 (*) 2 - 6.9   Granulocyte percent 73.2  37 - 80 %G   RBC 4.25  4.04 - 5.48 M/uL   Hemoglobin 13.6  12.2 - 16.2 g/dL   HCT, POC 43.2  37.7 - 47.9 %   MCV 101.7 (*) 80 - 97 fL   MCH, POC 31.9 (*) 27 - 31.2 pg   MCHC 31.4 (*) 31.8 - 35.4 g/dL   RDW, POC 15.5     Platelet Count, POC 387  142 - 424 K/uL   MPV 7.1  0 - 99.8 fL  GLUCOSE, POCT (MANUAL RESULT ENTRY)      Result Value Ref Range   POC Glucose 135 (*) 70 - 99 mg/dl       Assessment & Plan:   1. Cellulitis of leg, right   2. Right leg swelling   3. Type II or unspecified type diabetes mellitus without mention of complication, uncontrolled   4. Essential hypertension, benign   5. Sepsis(995.91)    1. RLE cellulitis: improving and stable on oral Clindamycin; extend Clindamycin rx for ten total days.  No evidence of acutely worsening cellulitis at this time. No associated systemic symptoms with stable WBC count since discharge.  RTC immediately for increasing redness,  recurrent fever.  Rx for Oxycodone provided. 2.  R leg swelling:  Persistent with recent worsening since discharge which is likely due to increased mobility/ambulation and increased salt intake in diet. Pt requested D-Dimer which I expect to be elevated considering recent sepsis and acute cellulitis.  Refer for repeat doppler due to recent sedentary lifestyle and morbid obesity. 3.  Sepsis: resolved; stable blood pressure; hemodynamically stable at this time. 4.  HTN: elevated which is likely due to pain and excessive sodium intake.  Recommend sodium restriction and treatment of pain.  Recommend monitoring BP at home daily.  Follow-up with PCP in six days. 5. DMII: uncontrolled; increase Lantus to 54 units daily; continue SSI as prescribed by hospitalist.  Meds ordered this encounter  Medications  . oxyCODONE-acetaminophen (PERCOCET/ROXICET) 5-325 MG per tablet    Sig: Take 1 tablet by mouth every 4 (four) hours as needed for severe pain.    Dispense:  30 tablet    Refill:  0  . clindamycin (CLEOCIN) 300 MG capsule    Sig: Take 1 capsule (300 mg total) by mouth 4 (four) times daily.    Dispense:  20 capsule    Refill:  0    No Follow-up on file.    I personally performed the services described in this documentation, which was scribed in my presence. The recorded information has been reviewed and is accurate.  Reginia Forts, M.D.  [facility] [address] [place], [place]  [address] [phone] phone [phone] fax

## 2013-08-29 ENCOUNTER — Telehealth: Payer: Self-pay | Admitting: *Deleted

## 2013-08-29 ENCOUNTER — Other Ambulatory Visit (HOSPITAL_COMMUNITY): Payer: Self-pay | Admitting: Family Medicine

## 2013-08-29 ENCOUNTER — Ambulatory Visit (HOSPITAL_COMMUNITY)
Admission: RE | Admit: 2013-08-29 | Discharge: 2013-08-29 | Disposition: A | Discharge status: Home / Self Care | Payer: BC Managed Care – PPO | Source: Ambulatory Visit | Attending: Family Medicine | Admitting: Family Medicine

## 2013-08-29 DIAGNOSIS — R599 Enlarged lymph nodes, unspecified: Secondary | ICD-10-CM | POA: Diagnosis not present

## 2013-08-29 DIAGNOSIS — M7989 Other specified soft tissue disorders: Secondary | ICD-10-CM

## 2013-08-29 DIAGNOSIS — M79609 Pain in unspecified limb: Secondary | ICD-10-CM

## 2013-08-29 DIAGNOSIS — E669 Obesity, unspecified: Secondary | ICD-10-CM

## 2013-08-29 DIAGNOSIS — C541 Malignant neoplasm of endometrium: Secondary | ICD-10-CM

## 2013-08-29 LAB — D-DIMER, QUANTITATIVE: D-Dimer, Quant: 0.84 ug/mL-FEU — ABNORMAL HIGH (ref 0.00–0.48)

## 2013-08-29 NOTE — Telephone Encounter (Signed)
Noted  

## 2013-08-29 NOTE — Telephone Encounter (Signed)
Pt has been scheduled for a R Lower Extremity duplex at noon at [facility] today. Pt has been advised.  Pt will be calling to schedule an appt with Dr. Tamala Julian to establish care as her PCP.

## 2013-08-29 NOTE — Progress Notes (Signed)
Right lower extremity venous duplex completed.  Right:  No evidence of DVT, superficial thrombosis, or Baker's cyst.  Left:  Negative for DVT in the common femoral vein.  

## 2013-09-01 ENCOUNTER — Ambulatory Visit (INDEPENDENT_AMBULATORY_CARE_PROVIDER_SITE_OTHER): Payer: BC Managed Care – PPO | Admitting: Family Medicine

## 2013-09-01 VITALS — BP 162/88 | HR 85 | Temp 97.9°F | Resp 18 | Ht 65.0 in | Wt 286.0 lb

## 2013-09-01 DIAGNOSIS — L03119 Cellulitis of unspecified part of limb: Secondary | ICD-10-CM

## 2013-09-01 DIAGNOSIS — L02419 Cutaneous abscess of limb, unspecified: Secondary | ICD-10-CM

## 2013-09-01 DIAGNOSIS — L03115 Cellulitis of right lower limb: Secondary | ICD-10-CM

## 2013-09-01 DIAGNOSIS — E119 Type 2 diabetes mellitus without complications: Secondary | ICD-10-CM

## 2013-09-01 NOTE — Patient Instructions (Signed)

## 2013-09-01 NOTE — Progress Notes (Signed)
Chief Complaint:  Chief Complaint  Patient presents with  . Cellulitis    On right leg, it is leaking alot of fluid, she noticed it at midnight last night    HPI: Kelli Allen is a 38 y.o. female who is here for  Recheck of right leg cellulitis. She was in hospital from 8/13-8/18. Improved. No fevers or chills. Just wanted to make sure that blister has not gotten infected, was draining yellow fluid. No blood. She was seen here after dc from hospital on 08/29/13 and DR Tamala Julian gave her Clindamycin 300 mg QID x 20 pills for 5 more days. So her total days on clindamycin as an outpatient is from 8/18-8/28.   PLease see Dr Thompson Caul note below from [place] 08/29/2013 Kelli Allen is a 37 y.o. female who presents to the [facility] for hospital follow-up for RLE cellulitis, sepsis. Patient is complaining of recurrent cellulitis to her right lower leg for 10 years. Tonight patient presents for follow up of right lower leg cellulitis and sepsis. Patient was admitted 8/13 - 8/18. She was just released two days ago. Patient has history of right lower extremity cellulitis. Treated with Vancomycin and zosyn for five days during admission and discharged on oral Clindamycin. Patient had a dog bite on her right arm that she suffered one day before onset of symptoms. The dog was not found to have rabies. She was given updated Tetanus during admission. Patient has history of Diabetes with elevated blood sugar in 200s during admission. Her blood cultures were negative x 2. MRSA screen was negative. Hemoglobin A1C 8.2. Patient was tachycardic during admission as well. There she had a lower extremity doppler to rule out DVT. Patient hypotensive upon admission with blood pressure of 80/40 during admission and was admitted to ICU for management.  Tonight patient reports that this spring and summer she developed separate occurrences of rash/cellulitis to her left ankle and her groin/genital region which  have since resolved. Her current episode is located at her right anterior lower leg. Patient reports improved redness since onset seven days ago. But as the day has progressed she reports appearance of white fluid in the lower center of the erythematous rash. Patient reports showering tonight; she states that this is her longest instance of standing. She usually sits with her right leg elevated. Her recent standing could account for the swelling tonight. She reports that usually with standing she experiences immediate lower leg swelling. She denies any drainage. She reports increased pain in her leg since her release from the hospital. Patient was on IV morphine during admission and was not discharged with pain medication. Patient reports that the rash is painful to touch.  Additionally patient reports noticing "knots" in her right leg medially and proximally. She reports awareness that the "knots" could be her lymph nodes but she still expresses concern. She developed these knots on the night of discharge from the hospital while she was receiving Lovenox injections. She underwent LE doppler upon admission but she had not noticed these knots at admission.  Patient denies fevers, chills, sweats, or cramping pain behind her knee where she locates the nodules since her release from the hospital. She denies SOB or chest pain.  Upon discharge, hospitalist discharged her no 50units of Lantus daily instead of her usual 80 units of Lantus. She was also discharged on a SSI at meals. Her sugars have been in the 200s since discharge except for yesterday when they finally  were in the 100s. Last night, pt ate [place] food and 1/2 a two liter of diet soda; her sugar last night two hours after eating did increase to the 300s; she took her SSI as advised. She is followed by endocrinology for diabetes; she is currently undergoing work up for [place].   Past Medical History  Diagnosis Date  . Diabetes mellitus   .  Cellulitis of lower leg     x3 on right leg  . Hypertension   . Irregular menstrual bleeding   . PCOS (polycystic ovarian syndrome)   . Tachycardia   . Abnormal Pap smear 2000  . Endometrial ca 12/20/2011  . Asthma     no issues in years  . Family history of anesthesia complication     sister had hard time waking up and problems breathing   Past Surgical History  Procedure Laterality Date  . Hysteroscopy w/d&c  01/24/2012    Procedure: DILATATION AND CURETTAGE /HYSTEROSCOPY;  Surgeon: Betsy Coder, MD;  Location: [facility];  Service: Gynecology;  Laterality: N/A;  with vulvar biopsies   . Intrauterine device (iud) insertion  01/24/2012    Procedure: INTRAUTERINE DEVICE (IUD) INSERTION;  Surgeon: Betsy Coder, MD;  Location: [facility];  Service: Gynecology;  Laterality: N/A;  . Dilation and curettage of uterus    . Robotic assisted total hysterectomy with bilateral salpingo oopherectomy Bilateral 07/02/2012    Procedure: ROBOTIC ASSISTED TOTAL HYSTERECTOMY WITH BILATERAL SALPINGO OOPHORECTOMY ;  Surgeon: Imagene Gurney A. Alycia Rossetti, MD;  Location: [facility];  Service: Gynecology;  Laterality: Bilateral;  . Abdominal hysterectomy     History   Social History  . Marital Status: Married    Spouse Name: N/A    Number of Children: N/A  . Years of Education: N/A   Social History Main Topics  . Smoking status: Never Smoker   . Smokeless tobacco: Never Used  . Alcohol Use: Yes     Comment: rare  . Drug Use: No  . Sexual Activity: Yes    Birth Control/ Protection: IUD     Comment: mirena    Other Topics Concern  . None   Social History Narrative  . None   Family History  Problem Relation Age of Onset  . Diabetes type II Mother   . Coronary artery disease Mother   . Diabetes Mother   . Heart disease Mother   . Hyperlipidemia Mother   . Hypertension Mother   . Diabetes type II Father   . Coronary artery disease Father   . Diabetes Father   . Heart disease Father   . Hyperlipidemia  Father   . Heart disease Sister   . Diabetes Sister    Allergies  Allergen Reactions  . Antibacterial Hand Soap [Triclosan] Other (See Comments)    Dry, itchy patches on skin   Prior to Admission medications   Medication Sig Start Date End Date Taking? Authorizing Provider  atorvastatin (LIPITOR) 10 MG tablet Take 10 mg by mouth every morning.    Yes Historical Provider, MD  Canagliflozin (INVOKANA) 300 MG TABS Take by mouth every morning.    Yes Historical Provider, MD  clindamycin (CLEOCIN) 300 MG capsule Take 1 capsule (300 mg total) by mouth 4 (four) times daily. 08/28/13  Yes Wardell Honour, MD  gabapentin (NEURONTIN) 100 MG capsule Take 300 mg by mouth at bedtime.    Yes Historical Provider, MD  insulin aspart (NOVOLOG) 100 UNIT/ML injection Inject 35 Units into the skin 3 (  three) times daily with meals.    Yes Historical Provider, MD  insulin glargine (LANTUS) 100 UNIT/ML injection Inject 80 Units into the skin at bedtime. 03/01/11  Yes Bobby Rumpf [place], PA-C  labetalol (NORMODYNE) 200 MG tablet Take 200 mg by mouth 2 (two) times daily.   Yes Historical Provider, MD  Liraglutide (VICTOZA) 18 MG/3ML SOLN Inject 1.8 mg into the skin daily.    Yes Historical Provider, MD  lisinopril-hydrochlorothiazide (PRINZIDE,ZESTORETIC) 20-12.5 MG per tablet Take 1 tablet by mouth every morning.    Yes Historical Provider, MD  metformin (FORTAMET) 500 MG (OSM) 24 hr tablet Take 1,000 mg by mouth 2 (two) times daily with a meal.    Yes Historical Provider, MD  NON FORMULARY Take 1 capsule by mouth 2 (two) times daily. nutralite omega 3   Yes Historical Provider, MD  OVER THE COUNTER MEDICATION Take 1 packet by mouth 2 (two) times daily. Nutralite multi  Vitamins   Yes Historical Provider, MD  OVER THE COUNTER MEDICATION Take 1 tablet by mouth 2 (two) times daily. Glucose Health Cromium Piccolate   Yes Historical Provider, MD  oxyCODONE-acetaminophen (PERCOCET/ROXICET) 5-325 MG per tablet Take 1 tablet by  mouth every 4 (four) hours as needed for severe pain. 08/28/13  Yes Wardell Honour, MD  PROAIR HFA 108 [phone] BASE) MCG/ACT inhaler Inhale 2 puffs into the lungs every 4 (four) hours as needed for shortness of breath.  11/21/12  Yes Historical Provider, MD  saccharomyces boulardii (FLORASTOR) 250 MG capsule Take 1 capsule (250 mg total) by mouth 2 (two) times daily. 08/26/13  Yes Janece Canterbury, MD     ROS: The patient denies fevers, chills, night sweats, unintentional weight loss, chest pain, palpitations, wheezing, dyspnea on exertion, nausea, vomiting, abdominal pain, dysuria, hematuria, melena  All other systems have been reviewed and were otherwise negative with the exception of those mentioned in the HPI and as above.    PHYSICAL EXAM: Filed Vitals:   09/01/13 0943  BP: 162/88  Pulse: 85  Temp: 97.9 F (36.6 C)  Resp: 18   Filed Vitals:   09/01/13 0943  Height: 5\' 5"  (1.651 m)  Weight: 286 lb (129.729 kg)   Body mass index is 47.59 kg/(m^2).  General: Alert, no acute distress. Morbidly obese HEENT:  Normocephalic, atraumatic, oropharynx patent. EOMI, PERRLA Cardiovascular:  Regular rate and rhythm, no rubs murmurs or gallops.  No Carotid bruits, radial pulse intact. No pedal edema.  Respiratory: Clear to auscultation bilaterally.  No wheezes, rales, or rhonchi.  No cyanosis, no use of accessory musculature GI: No organomegaly, abdomen is soft and non-tender, positive bowel sounds.  No masses. Skin: + cellulitis, her blisters has actually drained, skin is still intact, will leave it since offers protection.  Neurologic: Facial musculature symmetric. Psychiatric: Patient is appropriate throughout our interaction. Lymphatic: No cervical lymphadenopathy Musculoskeletal: Gait antalgic   LABS: Results for orders placed in visit on 08/28/13  D-DIMER, QUANTITATIVE      Result Value Ref Range   D-Dimer, Quant 0.84 (*) 0.00 - 0.48 ug/mL-FEU  POCT CBC      Result Value Ref Range    WBC 10.3 (*) 4.6 - 10.2 K/uL   Lymph, poc 2.2  0.6 - 3.4   POC LYMPH PERCENT 21.3  10 - 50 %L   MID (cbc) 0.6  0 - 0.9   POC MID % 5.5  0 - 12 %M   POC Granulocyte 7.5 (*) 2 - 6.9   Granulocyte percent  73.2  37 - 80 %G   RBC 4.25  4.04 - 5.48 M/uL   Hemoglobin 13.6  12.2 - 16.2 g/dL   HCT, POC 43.2  37.7 - 47.9 %   MCV 101.7 (*) 80 - 97 fL   MCH, POC 31.9 (*) 27 - 31.2 pg   MCHC 31.4 (*) 31.8 - 35.4 g/dL   RDW, POC 15.5     Platelet Count, POC 387  142 - 424 K/uL   MPV 7.1  0 - 99.8 fL  GLUCOSE, POCT (MANUAL RESULT ENTRY)      Result Value Ref Range   POC Glucose 135 (*) 70 - 99 mg/dl     EKG/XRAY:   Primary read interpreted by Dr. Marin Comment at [facility].   ASSESSMENT/PLAN: Encounter Diagnoses  Name Primary?  . Cellulitis of leg, right Yes  . Type II or unspecified type diabetes mellitus without mention of complication, not stated as uncontrolled   . Morbid obesity    38 y.o morbidly obese female with DM  Who had a recent hospitalization for cellulitis on IV abx and has been on outpatient clindamycin and roxicet for pain. She was dc on 8/18 and has been on clindamycin 300 mg QID Stable, cellulitis is improved, no weeping drainage but can see there is some straw colored dc on bandage. Advise to clean with soap and water daily and dress with dry nonadhesive dressing  Cont with meds Will f/u with Dr Tamala Julian as scheduled , precautions given for worsening sxs.  Gross sideeffects, risk and benefits, and alternatives of medications d/w patient. Patient is aware that all medications have potential sideeffects and we are unable to predict every sideeffect or drug-drug interaction that may occur.  LE, [place], DO 09/01/2013 12:54 PM   09/03/13-spoke with patient today, she states there is some exposed areas where skin from blister has pulled away.She has some pain in the exposed area, she is not febrile, she is monitoring it and swelling has gone down, she feels overall better. Will rx  silvadene for localized area, likely foramtion of stage 1 ulder so will rx her some protectant and pain relief with silvadene. F/u with Dr Tamala Julian.

## 2013-09-03 MED ORDER — SILVER SULFADIAZINE 1 % EX CREA: 1.0000 "application " | TOPICAL_CREAM | Freq: Every day | CUTANEOUS | Status: DC

## 2013-09-05 ENCOUNTER — Ambulatory Visit (INDEPENDENT_AMBULATORY_CARE_PROVIDER_SITE_OTHER): Payer: BC Managed Care – PPO | Admitting: Internal Medicine

## 2013-09-05 ENCOUNTER — Emergency Department (HOSPITAL_BASED_OUTPATIENT_CLINIC_OR_DEPARTMENT_OTHER)
Admission: EM | Admit: 2013-09-05 | Discharge: 2013-09-05 | Disposition: A | Discharge status: Home / Self Care | Payer: BC Managed Care – PPO | Attending: Emergency Medicine | Admitting: Emergency Medicine

## 2013-09-05 ENCOUNTER — Encounter (HOSPITAL_BASED_OUTPATIENT_CLINIC_OR_DEPARTMENT_OTHER): Payer: Self-pay | Admitting: Emergency Medicine

## 2013-09-05 VITALS — BP 126/78 | HR 101 | Temp 98.5°F | Resp 17 | Ht 65.5 in | Wt 281.0 lb

## 2013-09-05 DIAGNOSIS — L03119 Cellulitis of unspecified part of limb: Secondary | ICD-10-CM | POA: Diagnosis not present

## 2013-09-05 DIAGNOSIS — L03116 Cellulitis of left lower limb: Secondary | ICD-10-CM

## 2013-09-05 DIAGNOSIS — E1165 Type 2 diabetes mellitus with hyperglycemia: Principal | ICD-10-CM

## 2013-09-05 DIAGNOSIS — J45909 Unspecified asthma, uncomplicated: Secondary | ICD-10-CM | POA: Insufficient documentation

## 2013-09-05 DIAGNOSIS — E119 Type 2 diabetes mellitus without complications: Secondary | ICD-10-CM | POA: Diagnosis not present

## 2013-09-05 DIAGNOSIS — M79662 Pain in left lower leg: Secondary | ICD-10-CM

## 2013-09-05 DIAGNOSIS — Z794 Long term (current) use of insulin: Secondary | ICD-10-CM | POA: Insufficient documentation

## 2013-09-05 DIAGNOSIS — I10 Essential (primary) hypertension: Secondary | ICD-10-CM | POA: Diagnosis not present

## 2013-09-05 DIAGNOSIS — L02419 Cutaneous abscess of limb, unspecified: Secondary | ICD-10-CM

## 2013-09-05 DIAGNOSIS — Z8542 Personal history of malignant neoplasm of other parts of uterus: Secondary | ICD-10-CM | POA: Diagnosis not present

## 2013-09-05 DIAGNOSIS — Z79899 Other long term (current) drug therapy: Secondary | ICD-10-CM | POA: Insufficient documentation

## 2013-09-05 DIAGNOSIS — L03115 Cellulitis of right lower limb: Secondary | ICD-10-CM

## 2013-09-05 DIAGNOSIS — L089 Local infection of the skin and subcutaneous tissue, unspecified: Secondary | ICD-10-CM | POA: Diagnosis present

## 2013-09-05 DIAGNOSIS — IMO0001 Reserved for inherently not codable concepts without codable children: Secondary | ICD-10-CM

## 2013-09-05 LAB — POCT CBC
Granulocyte percent: 80.1 %G — AB (ref 37–80)
HCT, POC: 46.2 % (ref 37.7–47.9)
Hemoglobin: 14.7 g/dL (ref 12.2–16.2)
Lymph, poc: 2 (ref 0.6–3.4)
MCH, POC: 31.9 pg — AB (ref 27–31.2)
MCHC: 31.8 g/dL (ref 31.8–35.4)
MCV: 100.2 fL — AB (ref 80–97)
MID (CBC): 1 — AB (ref 0–0.9)
MPV: 7.5 fL (ref 0–99.8)
POC Granulocyte: 12 — AB (ref 2–6.9)
POC LYMPH PERCENT: 13.5 %L (ref 10–50)
POC MID %: 6.4 %M (ref 0–12)
Platelet Count, POC: 384 10*3/uL (ref 142–424)
RBC: 4.61 M/uL (ref 4.04–5.48)
RDW, POC: 14.7 %
WBC: 15 10*3/uL — AB (ref 4.6–10.2)

## 2013-09-05 LAB — COMPREHENSIVE METABOLIC PANEL
ALBUMIN: 4.3 g/dL (ref 3.5–5.2)
ALK PHOS: 58 U/L (ref 39–117)
ALT: 27 U/L (ref 0–35)
ANION GAP: 18 — AB (ref 5–15)
AST: 15 U/L (ref 0–37)
BUN: 17 mg/dL (ref 6–23)
CALCIUM: 10.8 mg/dL — AB (ref 8.4–10.5)
CO2: 25 mEq/L (ref 19–32)
Chloride: 96 mEq/L (ref 96–112)
Creatinine, Ser: 0.7 mg/dL (ref 0.50–1.10)
GFR calc Af Amer: >90 mL/min (ref >90)
GFR calc non Af Amer: >90 mL/min (ref >90)
Glucose, Bld: 232 mg/dL — ABNORMAL HIGH (ref 70–99)
Potassium: 4.8 mEq/L (ref 3.7–5.3)
SODIUM: 139 meq/L (ref 137–147)
TOTAL PROTEIN: 8 g/dL (ref 6.0–8.3)
Total Bilirubin: 0.4 mg/dL (ref 0.3–1.2)

## 2013-09-05 LAB — CBC WITH DIFFERENTIAL/PLATELET
BASOS ABS: 0.1 10*3/uL (ref 0.0–0.1)
Basophils Relative: 1 % (ref 0–1)
Eosinophils Absolute: 0.1 10*3/uL (ref 0.0–0.7)
Eosinophils Relative: 1 % (ref 0–5)
HCT: 46.1 % — ABNORMAL HIGH (ref 36.0–46.0)
HEMOGLOBIN: 15 g/dL (ref 12.0–15.0)
LYMPHS ABS: 1.4 10*3/uL (ref 0.7–4.0)
LYMPHS PCT: 10 % — AB (ref 12–46)
MCH: 33.2 pg (ref 26.0–34.0)
MCHC: 32.5 g/dL (ref 30.0–36.0)
MCV: 102 fL — AB (ref 78.0–100.0)
MONO ABS: 1.1 10*3/uL — AB (ref 0.1–1.0)
MONOS PCT: 7 % (ref 3–12)
NEUTROS ABS: 12.1 10*3/uL — AB (ref 1.7–7.7)
Neutrophils Relative %: 81 % — ABNORMAL HIGH (ref 43–77)
Platelets: 368 10*3/uL (ref 150–400)
RBC: 4.52 MIL/uL (ref 3.87–5.11)
RDW: 14.2 % (ref 11.5–15.5)
WBC: 14.8 10*3/uL — AB (ref 4.0–10.5)

## 2013-09-05 LAB — GLUCOSE, POCT (MANUAL RESULT ENTRY): POC Glucose: 180 mg/dl — AB (ref 70–99)

## 2013-09-05 LAB — I-STAT CG4 LACTIC ACID, ED: LACTIC ACID, VENOUS: 1.89 mmol/L (ref 0.5–2.2)

## 2013-09-05 LAB — CBG MONITORING, ED: GLUCOSE-CAPILLARY: 222 mg/dL — AB (ref 70–99)

## 2013-09-05 MED ORDER — SULFAMETHOXAZOLE-TMP DS 800-160 MG PO TABS
1.0000 | ORAL_TABLET | Freq: Once | ORAL | Status: AC
  Administered 2013-09-05: 1 via ORAL
  Filled 2013-09-05: qty 1

## 2013-09-05 MED ORDER — CEFTRIAXONE SODIUM 1 G IJ SOLR
1.0000 g | Freq: Once | INTRAMUSCULAR | Status: AC
  Administered 2013-09-05: 1 g via INTRAMUSCULAR

## 2013-09-05 MED ORDER — CLINDAMYCIN HCL 300 MG PO CAPS: 300.0000 mg | ORAL_CAPSULE | Freq: Four times a day (QID) | ORAL | Status: DC

## 2013-09-05 MED ORDER — SODIUM CHLORIDE 0.9 % IV SOLN: Freq: Once | INTRAVENOUS | Status: DC

## 2013-09-05 MED ORDER — SULFAMETHOXAZOLE-TRIMETHOPRIM 800-160 MG PO TABS: 1.0000 | ORAL_TABLET | Freq: Two times a day (BID) | ORAL | Status: AC

## 2013-09-05 MED ORDER — CEPHALEXIN 500 MG PO CAPS: 500.0000 mg | ORAL_CAPSULE | Freq: Four times a day (QID) | ORAL | Status: DC

## 2013-09-05 NOTE — ED Notes (Signed)
States she was admitted with cellulitis in her right lower leg 2 weeks ago. Today her pain and redness is starting again. Her left lower leg is red as well. She was seen at [facility] this am and given an antibiotic and they told her to come to the ED if she wanted to.

## 2013-09-05 NOTE — ED Provider Notes (Signed)
CSN: [id number]     Arrival date & time 09/05/13  1603 History   First MD Initiated Contact with Patient 09/05/13 1732     Chief Complaint  Patient presents with  . Recurrent Skin Infections     (Consider location/radiation/quality/duration/timing/severity/associated sxs/prior Treatment) Patient is a 38 y.o. female presenting with leg pain. The history is provided by the patient. No language interpreter was used.  Leg Pain Location:  Leg Time since incident:  1 day Injury: no   Leg location:  L leg Pain details:    Quality:  Aching   Radiates to:  Does not radiate   Severity:  No pain   Onset quality:  Gradual   Timing:  Constant   Progression:  Worsening Chronicity:  New Dislocation: no   Foreign body present:  No foreign bodies Prior injury to area:  No Relieved by:  Nothing Worsened by:  Nothing tried Ineffective treatments:  None tried Associated symptoms: no back pain, no fever and no itching   Risk factors: no concern for non-accidental trauma     Past Medical History  Diagnosis Date  . Diabetes mellitus   . Cellulitis of lower leg     x3 on right leg  . Hypertension   . Irregular menstrual bleeding   . PCOS (polycystic ovarian syndrome)   . Tachycardia   . Abnormal Pap smear 2000  . Endometrial ca 12/20/2011  . Asthma     no issues in years  . Family history of anesthesia complication     sister had hard time waking up and problems breathing   Past Surgical History  Procedure Laterality Date  . Hysteroscopy w/d&c  01/24/2012    Procedure: DILATATION AND CURETTAGE /HYSTEROSCOPY;  Surgeon: Betsy Coder, MD;  Location: [facility];  Service: Gynecology;  Laterality: N/A;  with vulvar biopsies   . Intrauterine device (iud) insertion  01/24/2012    Procedure: INTRAUTERINE DEVICE (IUD) INSERTION;  Surgeon: Betsy Coder, MD;  Location: [facility];  Service: Gynecology;  Laterality: N/A;  . Dilation and curettage of uterus    . Robotic assisted total hysterectomy  with bilateral salpingo oopherectomy Bilateral 07/02/2012    Procedure: ROBOTIC ASSISTED TOTAL HYSTERECTOMY WITH BILATERAL SALPINGO OOPHORECTOMY ;  Surgeon: Imagene Gurney A. Alycia Rossetti, MD;  Location: [facility];  Service: Gynecology;  Laterality: Bilateral;  . Abdominal hysterectomy     Family History  Problem Relation Age of Onset  . Diabetes type II Mother   . Coronary artery disease Mother   . Diabetes Mother   . Heart disease Mother   . Hyperlipidemia Mother   . Hypertension Mother   . Diabetes type II Father   . Coronary artery disease Father   . Diabetes Father   . Heart disease Father   . Hyperlipidemia Father   . Heart disease Sister   . Diabetes Sister    History  Substance Use Topics  . Smoking status: Never Smoker   . Smokeless tobacco: Never Used  . Alcohol Use: Yes     Comment: rare   OB History   Grav Para Term Preterm Abortions TAB SAB Ect Mult Living   0              Review of Systems  Constitutional: Negative for fever.  Musculoskeletal: Negative for back pain.  Skin: Negative for itching.  All other systems reviewed and are negative.     Allergies  Antibacterial hand soap  Home Medications   Prior to Admission medications  Medication Sig Start Date End Date Taking? Authorizing Provider  atorvastatin (LIPITOR) 10 MG tablet Take 10 mg by mouth every morning.     Historical Provider, MD  Canagliflozin (INVOKANA) 300 MG TABS Take by mouth every morning.     Historical Provider, MD  clindamycin (CLEOCIN) 300 MG capsule Take 1 capsule (300 mg total) by mouth 4 (four) times daily. 09/05/13   Orma Flaming, MD  gabapentin (NEURONTIN) 100 MG capsule Take 300 mg by mouth at bedtime.     Historical Provider, MD  insulin aspart (NOVOLOG) 100 UNIT/ML injection Inject 35 Units into the skin 3 (three) times daily with meals.     Historical Provider, MD  insulin glargine (LANTUS) 100 UNIT/ML injection Inject 80 Units into the skin at bedtime. 03/01/11   Bobby Rumpf York, PA-C   labetalol (NORMODYNE) 200 MG tablet Take 200 mg by mouth 2 (two) times daily.    Historical Provider, MD  Liraglutide (VICTOZA) 18 MG/3ML SOLN Inject 1.8 mg into the skin daily.     Historical Provider, MD  lisinopril-hydrochlorothiazide (PRINZIDE,ZESTORETIC) 20-12.5 MG per tablet Take 1 tablet by mouth every morning.     Historical Provider, MD  metformin (FORTAMET) 500 MG (OSM) 24 hr tablet Take 1,000 mg by mouth 2 (two) times daily with a meal.     Historical Provider, MD  NON FORMULARY Take 1 capsule by mouth 2 (two) times daily. nutralite omega 3    Historical Provider, MD  OVER THE COUNTER MEDICATION Take 1 packet by mouth 2 (two) times daily. Nutralite multi  Vitamins    Historical Provider, MD  OVER THE COUNTER MEDICATION Take 1 tablet by mouth 2 (two) times daily. Glucose Health Cromium Piccolate    Historical Provider, MD  oxyCODONE-acetaminophen (PERCOCET/ROXICET) 5-325 MG per tablet Take 1 tablet by mouth every 4 (four) hours as needed for severe pain. 08/28/13   Wardell Honour, MD  PROAIR HFA 108 (90 BASE) MCG/ACT inhaler Inhale 2 puffs into the lungs every 4 (four) hours as needed for shortness of breath.  11/21/12   Historical Provider, MD  saccharomyces boulardii (FLORASTOR) 250 MG capsule Take 1 capsule (250 mg total) by mouth 2 (two) times daily. 08/26/13   Janece Canterbury, MD  silver sulfADIAZINE (SILVADENE) 1 % cream Apply 1 application topically daily. Apply dime size thickness to affected area daily 09/03/13   Thao P Le, DO   BP 128/72  Pulse 107  Temp(Src) 98.7 F (37.1 C) (Oral)  Resp 20  Ht 5\' 5"  (1.651 m)  Wt 281 lb (127.461 kg)  BMI 46.76 kg/m2  SpO2 97%  LMP 01/17/2012 Physical Exam  Constitutional: She is oriented to person, place, and time. She appears well-developed and well-nourished.  HENT:  Head: Normocephalic.  Neck: Normal range of motion.  Cardiovascular: Normal rate and normal heart sounds.   Pulmonary/Chest: Effort normal.  Abdominal: Soft.   Musculoskeletal: She exhibits tenderness.  Healing wound and redness right lower leg, small area of erythema left lower no warmth.   Neurological: She is alert and oriented to person, place, and time. She has normal reflexes.  Skin: Skin is warm.  Psychiatric: She has a normal mood and affect.    ED Course  Procedures (including critical care time) Labs Review Labs Reviewed  CBG MONITORING, ED - Abnormal; Notable for the following:    Glucose-Capillary 222 (*)    All other components within normal limits    Imaging Review No results found.   EKG Interpretation None  Pt was given a shot of rocephin at [facility] today.   Pt reports she worried because she was admitted with sepsis the last time she had an infection.  Dr. Mingo Amber in to see.   I will switch to Bactrim and keflex.   Pt advised to follow up with Dr. Elder Cyphers for recheck.  MDM   Final diagnoses:  Cellulitis of right lower extremity  Pain of left lower leg        Fransico Meadow, PA-C 09/06/13 1345

## 2013-09-05 NOTE — Progress Notes (Signed)
   Subjective:    Patient ID: Kelli Allen, female    DOB: 1975/10/08, 38 y.o.   MRN: [id number]  HPI 38 year old femele complains of cellulitis. It started in right leg and is currently on clidamycin. She is now complaining of a spot showing up this morning on the left leg. This red and tender like right leg cellulitis. She was recently discharged for cellulitis and had received vancomycin/Tikosyn IV. See Dr. Darliss Ridgel recent visit where oral clindamycin was continued. She is obese and on insulin for diabetes. Also has tenderness in thigh suggestive of active in infection. Cultures reviewed and all have been negative including MRSA serum screen.  Cbc, glucose ordered  Review of Systems     Objective:   Physical Exam  Constitutional: She is oriented to person, place, and time. She appears well-nourished. She appears distressed.  HENT:  Head: Normocephalic.  Eyes: EOM are normal. Pupils are equal, round, and reactive to light.  Neck: Normal range of motion.  Cardiovascular: Normal rate, regular rhythm and normal heart sounds.   Pulmonary/Chest: Effort normal.  Musculoskeletal: She exhibits tenderness.  Neurological: She is alert and oriented to person, place, and time. She exhibits normal muscle tone. Coordination normal.  Skin: Rash noted. Rash is macular. There is erythema.     Right cellulitis improving, bullae drying up. X is tender area  Left lower shin is new erythema  Psychiatric: She has a normal mood and affect. Her behavior is normal.   Results for orders placed in visit on 09/05/13  POCT CBC      Result Value Ref Range   WBC 15.0 (*) 4.6 - 10.2 K/uL   Lymph, poc 2.0  0.6 - 3.4   POC LYMPH PERCENT 13.5  10 - 50 %L   MID (cbc) 1.0 (*) 0 - 0.9   POC MID % 6.4  0 - 12 %M   POC Granulocyte 12.0 (*) 2 - 6.9   Granulocyte percent 80.1 (*) 37 - 80 %G   RBC 4.61  4.04 - 5.48 M/uL   Hemoglobin 14.7  12.2 - 16.2 g/dL   HCT, POC 46.2  37.7 - 47.9 %   MCV 100.2 (*) 80 - 97 fL    MCH, POC 31.9 (*) 27 - 31.2 pg   MCHC 31.8  31.8 - 35.4 g/dL   RDW, POC 14.7     Platelet Count, POC 384  142 - 424 K/uL   MPV 7.5  0 - 99.8 fL  GLUCOSE, POCT (MANUAL RESULT ENTRY)      Result Value Ref Range   POC Glucose 180 (*) 70 - 99 mg/dl          Assessment & Plan:  Progressive cellulitis now left leg also Rocephin 1g RF clindamycin 300mg  qid RTC tomorrow.

## 2013-09-05 NOTE — Discharge Instructions (Signed)

## 2013-09-05 NOTE — Patient Instructions (Signed)

## 2013-09-05 NOTE — Progress Notes (Signed)
   Subjective:    Patient ID: Kelli Allen, female    DOB: 1975-08-27, 38 y.o.   MRN: [id number]  HPI    Review of Systems     Objective:   Physical Exam        Assessment & Plan:

## 2013-09-06 ENCOUNTER — Ambulatory Visit (INDEPENDENT_AMBULATORY_CARE_PROVIDER_SITE_OTHER): Payer: BC Managed Care – PPO | Admitting: Internal Medicine

## 2013-09-06 VITALS — BP 116/70 | HR 96 | Temp 97.7°F | Resp 16 | Ht 65.5 in | Wt 280.1 lb

## 2013-09-06 DIAGNOSIS — L02419 Cutaneous abscess of limb, unspecified: Secondary | ICD-10-CM

## 2013-09-06 DIAGNOSIS — L03119 Cellulitis of unspecified part of limb: Secondary | ICD-10-CM

## 2013-09-06 MED ORDER — CEFTRIAXONE SODIUM 1 G IJ SOLR
1.0000 g | Freq: Once | INTRAMUSCULAR | Status: AC
  Administered 2013-09-06: 1 g via INTRAMUSCULAR

## 2013-09-06 NOTE — ED Provider Notes (Signed)
Medical screening examination/treatment/procedure(s) were conducted as a shared visit with non-physician practitioner(s) and myself.  I personally evaluated the patient during the encounter.   EKG Interpretation None      Patient here with spot on L leg that is red. Recently admitted by me to [facility] ICU for septic shock from R lower leg cellulitis. Has been on clindamycin for several weeks and still had another spot come up. Seen by Urgent Care, thought it could be strep related, given Rocephin. Labs ok here, leukocytosis seen on prior. Patient wants a dose of IV vancomycin, I informed her that would be ill advised. She is non-toxic. Will give Rx for bactrim and have her f/u with PCP.  Evelina Bucy, MD 09/06/13 [phone]

## 2013-09-06 NOTE — Progress Notes (Signed)
   Subjective:    Patient ID: Kelli Allen, female    DOB: Feb 28, 1975, 38 y.o.   MRN: [id number]  HPI Feels no worse, no fever, both legs fading erythema, less pain. Went to er last night and clindamycin dced, bactrim and keflex started po.   Review of Systems     Objective:   Physical Exam Legs both improved Right leg epitrochlear nodes still tender NMSV intact       Assessment & Plan:  Rocephin 1g im  Recheck progress tomorrow phone or come See Dr. Tamala Julian Tuesday

## 2013-09-07 ENCOUNTER — Ambulatory Visit (INDEPENDENT_AMBULATORY_CARE_PROVIDER_SITE_OTHER): Payer: BC Managed Care – PPO | Admitting: Internal Medicine

## 2013-09-07 VITALS — BP 136/80 | HR 100 | Temp 97.5°F | Resp 16 | Ht 65.0 in | Wt 283.4 lb

## 2013-09-07 DIAGNOSIS — M25569 Pain in unspecified knee: Secondary | ICD-10-CM

## 2013-09-07 DIAGNOSIS — L02419 Cutaneous abscess of limb, unspecified: Secondary | ICD-10-CM

## 2013-09-07 DIAGNOSIS — L03119 Cellulitis of unspecified part of limb: Secondary | ICD-10-CM

## 2013-09-07 LAB — POCT CBC
GRANULOCYTE PERCENT: 76.6 % (ref 37–80)
HCT, POC: 45 % (ref 37.7–47.9)
Hemoglobin: 14.2 g/dL (ref 12.2–16.2)
Lymph, poc: 1.7 (ref 0.6–3.4)
MCH, POC: 31.6 pg — AB (ref 27–31.2)
MCHC: 31.6 g/dL — AB (ref 31.8–35.4)
MCV: 99.9 fL — AB (ref 80–97)
MID (CBC): 0.7 (ref 0–0.9)
MPV: 7.9 fL (ref 0–99.8)
POC Granulocyte: 8 — AB (ref 2–6.9)
POC LYMPH PERCENT: 16.5 %L (ref 10–50)
POC MID %: 6.9 %M (ref 0–12)
Platelet Count, POC: 375 10*3/uL (ref 142–424)
RBC: 4.5 M/uL (ref 4.04–5.48)
RDW, POC: 14.8 %
WBC: 10.4 10*3/uL — AB (ref 4.6–10.2)

## 2013-09-07 MED ORDER — CEFTRIAXONE SODIUM 1 G IJ SOLR
1.0000 g | Freq: Once | INTRAMUSCULAR | Status: AC
  Administered 2013-09-07: 1 g via INTRAMUSCULAR

## 2013-09-07 NOTE — Patient Instructions (Signed)

## 2013-09-07 NOTE — Progress Notes (Signed)
   Subjective:    Patient ID: Kelli Allen, female    DOB: August 25, 1975, 38 y.o.   MRN: [id number]  HPI    Review of Systems     Objective:   Physical Exam        Assessment & Plan:

## 2013-09-07 NOTE — Progress Notes (Signed)
   Subjective:    Patient ID: Kelli Allen, female    DOB: [birth year], 38 y.o.   MRN: [id number]  HPI  Patient is being seen at [facility] for follow-up on cellulitis on both legs. Patient feels no worse  But some tenderness.She is now complaining of a spot showing up this morning on the left leg. This red and tender like right leg cellulitis. She was recently discharged for cellulitis and had received vancomycin on 08/21/13-08/25/13. See Dr. Darliss Ridgel recent visit where oral clindamycin was continued. No fever. Still have a little bit of pain. No fevers, chills, sweats, or cramping pain. Definitely is improving. Review of Systems     Objective:   Physical Exam  Constitutional: She is oriented to person, place, and time. She appears well-nourished. No distress.  HENT:  Head: Normocephalic.  Eyes: EOM are normal. No scleral icterus.  Neck: Normal range of motion.  Pulmonary/Chest: Effort normal.  Neurological: She is alert and oriented to person, place, and time. She exhibits normal muscle tone. Coordination normal.  Skin: Skin is warm and dry. Lesion noted. Rash is not macular and not pustular. There is erythema.     Right leg less red, less tender, some silvadine staining, is scaling  Left is also less red, smaller and not tender  Psychiatric: She has a normal mood and affect. Her behavior is normal.   Results for orders placed in visit on 09/07/13  POCT CBC      Result Value Ref Range   WBC 10.4 (*) 4.6 - 10.2 K/uL   Lymph, poc 1.7  0.6 - 3.4   POC LYMPH PERCENT 16.5  10 - 50 %L   MID (cbc) 0.7  0 - 0.9   POC MID % 6.9  0 - 12 %M   POC Granulocyte 8.0 (*) 2 - 6.9   Granulocyte percent 76.6  37 - 80 %G   RBC 4.50  4.04 - 5.48 M/uL   Hemoglobin 14.2  12.2 - 16.2 g/dL   HCT, POC 45.0  37.7 - 47.9 %   MCV 99.9 (*) 80 - 97 fL   MCH, POC 31.6 (*) 27 - 31.2 pg   MCHC 31.6 (*) 31.8 - 35.4 g/dL   RDW, POC 14.8     Platelet Count, POC 375  142 - 424 K/uL   MPV  7.9  0 - 99.8 fL          Assessment & Plan:  Rocephin 1g/clearly is helping

## 2013-09-10 ENCOUNTER — Encounter: Payer: Self-pay | Admitting: Family Medicine

## 2013-09-10 ENCOUNTER — Ambulatory Visit (INDEPENDENT_AMBULATORY_CARE_PROVIDER_SITE_OTHER): Payer: BC Managed Care – PPO | Admitting: Family Medicine

## 2013-09-10 VITALS — BP 138/80 | HR 86 | Temp 98.5°F | Resp 18 | Wt 278.0 lb

## 2013-09-10 DIAGNOSIS — L603 Nail dystrophy: Secondary | ICD-10-CM

## 2013-09-10 DIAGNOSIS — B353 Tinea pedis: Secondary | ICD-10-CM

## 2013-09-10 DIAGNOSIS — L02419 Cutaneous abscess of limb, unspecified: Secondary | ICD-10-CM

## 2013-09-10 DIAGNOSIS — L03119 Cellulitis of unspecified part of limb: Secondary | ICD-10-CM

## 2013-09-10 DIAGNOSIS — E1165 Type 2 diabetes mellitus with hyperglycemia: Principal | ICD-10-CM

## 2013-09-10 DIAGNOSIS — L608 Other nail disorders: Secondary | ICD-10-CM

## 2013-09-10 DIAGNOSIS — IMO0001 Reserved for inherently not codable concepts without codable children: Secondary | ICD-10-CM

## 2013-09-10 NOTE — Progress Notes (Signed)
Patient ID: Kelli Allen, female   DOB: 02-Aug-1975, 38 y.o.   MRN: [id number]   Subjective:  This chart was scribed for Reginia Forts, MD by Donato Schultz, Medical Scribe. This patient was seen in Room 22 and the patient's care was started at 4:27 PM.   Patient ID: Karl Pock, female    DOB: 26-Feb-1975, 38 y.o.   MRN: [id number]  09/10/2013  Leg Swelling and has questions about medications   HPI HPI Comments: Valita Righter is a 38 y.o. female who presents to the [facility] for a emergency department follow-up.  She was admitted with severe right lower extremity cellulitis with septic shock and was discharged from the hospital on 8/18.  She was last seen by Dr. Tamala Julian at 8/20 and had her Clindamycin extended for 10 days.  On 8/24 she came in for follow-up and saw Dr. Marin Comment who did not change anything.  When she came back on 8/28 she saw Dr. Elder Cyphers.  Her white count was up to 15,000 and glucose was 180 and there was a new erythematous spot on the left lower extremity and the cellulitis originated on the right lower extremity.  She was given a shot of Rocephin.  She left [facility] and went to the ED and they switched her to Septra and Keflex.  She returned to Dr. Elder Cyphers on the 30th of August and her white count was down to 10.  He gave her another shot of Rocephin at that visit.    The right lower extremity cellulitis has gradually improved.  Her lower leg had turned black due to the Silvadene.  She has noticed some superficial peeling on her leg.  There was not a lot of fluid in her right leg when her symptoms started and she experienced some relief to her symptoms once the fluid was drained from her leg.  She is still experiencing some gradually resolving tenderness in her right inner thigh.  She is still taking the Bactrim and Keflex.  She is complaining of dry, crusted skin on the underside of her right foot and right heel.  Her podiatrist told her that she has a fungus on her foot and  she would like medication to relief her symptoms.    Her blood sugar is still unstable and she is not sure what is causing a fluctuation in her levels.  She checks her sugars 4-5 times a day and using a sliding scale of insulin.  Her lowest sugar was yesterday morning at 121.    She has never received a flu or pneumonia shot because she experiences cellulitis when she gets symptomatic.     Review of Systems  Constitutional: Negative for fever, chills, diaphoresis and fatigue.  Cardiovascular: Positive for leg swelling.  Endocrine: Negative for cold intolerance, heat intolerance, polydipsia, polyphagia and polyuria.  Skin: Positive for color change and rash. Negative for pallor and wound.  Hematological: Negative for adenopathy. Does not bruise/bleed easily.    Past Medical History  Diagnosis Date  . Diabetes mellitus   . Cellulitis of lower leg     x3 on right leg  . Hypertension   . Irregular menstrual bleeding   . PCOS (polycystic ovarian syndrome)   . Tachycardia   . Abnormal Pap smear 2000  . Endometrial ca 12/20/2011  . Asthma     no issues in years  . Family history of anesthesia complication     sister had hard time waking up and problems breathing  Past Surgical History  Procedure Laterality Date  . Hysteroscopy w/d&c  01/24/2012    Procedure: DILATATION AND CURETTAGE /HYSTEROSCOPY;  Surgeon: Betsy Coder, MD;  Location: [facility];  Service: Gynecology;  Laterality: N/A;  with vulvar biopsies   . Intrauterine device (iud) insertion  01/24/2012    Procedure: INTRAUTERINE DEVICE (IUD) INSERTION;  Surgeon: Betsy Coder, MD;  Location: [facility];  Service: Gynecology;  Laterality: N/A;  . Dilation and curettage of uterus    . Robotic assisted total hysterectomy with bilateral salpingo oopherectomy Bilateral 07/02/2012    Procedure: ROBOTIC ASSISTED TOTAL HYSTERECTOMY WITH BILATERAL SALPINGO OOPHORECTOMY ;  Surgeon: Imagene Gurney A. Alycia Rossetti, MD;  Location: [facility];  Service:  Gynecology;  Laterality: Bilateral;  . Abdominal hysterectomy     No Known Allergies Current Outpatient Prescriptions  Medication Sig Dispense Refill  . atorvastatin (LIPITOR) 10 MG tablet Take 10 mg by mouth every morning.       . Canagliflozin (INVOKANA) 300 MG TABS Take by mouth every morning.       . cephALEXin (KEFLEX) 500 MG capsule Take 1 capsule (500 mg total) by mouth 4 (four) times daily.  40 capsule  0  . gabapentin (NEURONTIN) 300 MG capsule Take 900 mg by mouth at bedtime.      . insulin aspart (NOVOLOG) 100 UNIT/ML injection Inject 35 Units into the skin 3 (three) times daily with meals. Sliding scale      . insulin glargine (LANTUS) 100 UNIT/ML injection Inject 54 Units into the skin at bedtime.       Marland Kitchen labetalol (NORMODYNE) 200 MG tablet Take 200 mg by mouth 2 (two) times daily.      . Liraglutide (VICTOZA) 18 MG/3ML SOLN Inject 1.8 mg into the skin daily.       Marland Kitchen lisinopril-hydrochlorothiazide (PRINZIDE,ZESTORETIC) 20-12.5 MG per tablet Take 1 tablet by mouth every morning.       . metformin (FORTAMET) 500 MG (OSM) 24 hr tablet Take 1,000 mg by mouth 2 (two) times daily with a meal.       . NON FORMULARY Take 1 capsule by mouth 2 (two) times daily. nutralite omega 3      . OVER THE COUNTER MEDICATION Take 1 packet by mouth 2 (two) times daily. Nutralite multi  Vitamins      . OVER THE COUNTER MEDICATION Take 1 tablet by mouth 2 (two) times daily. Glucose Health Cromium Piccolate      . oxyCODONE-acetaminophen (PERCOCET/ROXICET) 5-325 MG per tablet Take 1 tablet by mouth every 4 (four) hours as needed for severe pain.  30 tablet  0  . PROAIR HFA 108 (90 BASE) MCG/ACT inhaler Inhale 2 puffs into the lungs every 4 (four) hours as needed for shortness of breath.       . saccharomyces boulardii (FLORASTOR) 250 MG capsule Take 1 capsule (250 mg total) by mouth 2 (two) times daily.  60 capsule  0  . silver sulfADIAZINE (SILVADENE) 1 % cream Apply 1 application topically daily. Apply  dime size thickness to affected area daily  50 g  0   No current facility-administered medications for this visit.    Objective:  BP 138/80  Pulse 86  Temp(Src) 98.5 F (36.9 C)  Resp 18  Wt 278 lb (126.1 kg)  SpO2 96%  LMP 01/17/2012  Physical Exam  Nursing note and vitals reviewed. Constitutional: She is oriented to person, place, and time. She appears well-developed and well-nourished. No distress.  obese  HENT:  Head: Normocephalic and atraumatic.  Mouth/Throat: Oropharynx is clear and moist.  Eyes: Conjunctivae and EOM are normal. Pupils are equal, round, and reactive to light.  Neck: Normal range of motion.  Cardiovascular: Normal rate, regular rhythm and normal heart sounds.  Exam reveals no gallop and no friction rub.   No murmur heard. Pulmonary/Chest: Effort normal and breath sounds normal. No respiratory distress. She has no wheezes. She has no rales.  Musculoskeletal: Normal range of motion.  Neurological: She is alert and oriented to person, place, and time.  Skin: Skin is warm and dry. Rash noted. She is not diaphoretic. No erythema. No pallor.  Well healing cellulitis of the right lower extremity.  10 cm x 5 cm area of induration and resolving erythema.  Superficial peeling of the skin of the right lower extremity.  On the right foot, there is diffuse thickening of the heel with fissuring without thickening or redness.  Diffuse scaling of the plantar aspect of the foot with scaling extending into the interdigit spaces with skin thickening.    Psychiatric: She has a normal mood and affect. Her behavior is normal.    Results for orders placed in visit on 09/07/13  POCT CBC      Result Value Ref Range   WBC 10.4 (*) 4.6 - 10.2 K/uL   Lymph, poc 1.7  0.6 - 3.4   POC LYMPH PERCENT 16.5  10 - 50 %L   MID (cbc) 0.7  0 - 0.9   POC MID % 6.9  0 - 12 %M   POC Granulocyte 8.0 (*) 2 - 6.9   Granulocyte percent 76.6  37 - 80 %G   RBC 4.50  4.04 - 5.48 M/uL   Hemoglobin  14.2  12.2 - 16.2 g/dL   HCT, POC 45.0  37.7 - 47.9 %   MCV 99.9 (*) 80 - 97 fL   MCH, POC 31.6 (*) 27 - 31.2 pg   MCHC 31.6 (*) 31.8 - 35.4 g/dL   RDW, POC 14.8     Platelet Count, POC 375  142 - 424 K/uL   MPV 7.9  0 - 99.8 fL    Assessment & Plan:   1. Type II or unspecified type diabetes mellitus without mention of complication, uncontrolled   2. Cellulitis and abscess of leg   3. Nail dystrophy   4. Tinea pedis of right foot    1.  Cellulitis of B legs: improving with Keflex and Bactrim; no change in therapy at this time.  Will not obtain CBC because it will not affect management; pt expressed understanding. 2.  Tinea pedis of R foot:  New.  Possible source of recurrent cellulitis.  Obtain fungal culture of toenail. 3.  Dystrophic nails: New.  Obtain fungal culture.   4.  DMII: uncontrolled with recent infection; continue to monitor sugars closely with acute infection.  Continue SSI.  Meds ordered this encounter  Medications  . gabapentin (NEURONTIN) 300 MG capsule    Sig: Take 900 mg by mouth at bedtime.    Return in about 1 month (around 10/10/2013) for recheck.  I personally performed the services described in this documentation, which was scribed in my presence.  The recorded information has been reviewed and is accurate.  Reginia Forts, M.D.  [facility] [address] [place], [place]  [address] [phone] phone [phone] fax

## 2013-09-11 LAB — CULTURE, BLOOD (ROUTINE X 2)
CULTURE: NO GROWTH
Culture: NO GROWTH
SPECIAL REQUESTS: NORMAL
Special Requests: NORMAL

## 2013-09-16 ENCOUNTER — Encounter: Payer: Self-pay | Admitting: Family Medicine

## 2013-09-17 ENCOUNTER — Encounter: Payer: Self-pay | Admitting: Family Medicine

## 2013-09-25 ENCOUNTER — Ambulatory Visit (INDEPENDENT_AMBULATORY_CARE_PROVIDER_SITE_OTHER): Payer: BC Managed Care – PPO | Admitting: Family Medicine

## 2013-09-25 VITALS — BP 128/76 | HR 99 | Temp 97.9°F | Resp 16 | Ht 65.0 in | Wt 281.0 lb

## 2013-09-25 DIAGNOSIS — E249 Cushing's syndrome, unspecified: Secondary | ICD-10-CM

## 2013-09-25 DIAGNOSIS — L02419 Cutaneous abscess of limb, unspecified: Secondary | ICD-10-CM

## 2013-09-25 DIAGNOSIS — L03119 Cellulitis of unspecified part of limb: Secondary | ICD-10-CM

## 2013-09-25 DIAGNOSIS — L03115 Cellulitis of right lower limb: Secondary | ICD-10-CM

## 2013-09-25 HISTORY — DX: Cushing's syndrome, unspecified: E24.9

## 2013-09-25 MED ORDER — CEPHALEXIN 500 MG PO CAPS: 500.0000 mg | ORAL_CAPSULE | Freq: Four times a day (QID) | ORAL | Status: DC

## 2013-09-25 MED ORDER — SULFAMETHOXAZOLE-TMP DS 800-160 MG PO TABS: 1.0000 | ORAL_TABLET | Freq: Two times a day (BID) | ORAL | Status: DC

## 2013-09-25 MED ORDER — CEFTRIAXONE SODIUM 1 G IJ SOLR
1.0000 g | Freq: Once | INTRAMUSCULAR | Status: AC
  Administered 2013-09-25: 1 g via INTRAMUSCULAR

## 2013-09-25 NOTE — Progress Notes (Signed)
[facility] [address] [place] [address] [phone]  Date:  09/25/2013   Name:  Kelli Allen   DOB:  March 28, 1975   MRN:  [id number]  PCP:  Reginia Forts, MD    Chief Complaint: Follow-up   History of Present Illness:  Kelli Allen is a 38 y.o. very pleasant female patient who presents with the following:  See note from Dr. Tamala Julian from 09/10/13.  She is overall getting better, but she did return to work last week (she has a new job).  This has caused her to feel tired overall, but over the last week she has noted that she does "not feel great" and her leg appears to be a little more red again,  The leg "stings" a bit.  She has been off of abx for about 10 days now.  She most recently took keflex and bactrim and did well with this.   She does note that her glucose is running 150- upper 200s.    She is seeing endocrine through [facility] and they are working her up for likely cushings. They are also handling her DM.   She is to have an MRI soon.    HPI 09/10/13: HPI Comments:  Kelli Allen is a 38 y.o. female who [facility] for a emergency department follow-up. She was admitted with severe right lower extremity cellulitis with septic shock and was discharged from the hospital on 8/18. She was last seen by Dr. Tamala Julian at 8/20 and had her Clindamycin extended for 10 days. On 8/24 she came in for follow-up and saw Dr. Marin Comment who did not change anything. When she came back on 8/28 she saw Dr. Elder Cyphers. Her white count was up to 15,000 and glucose was 180 and there was a new erythematous spot on the left lower extremity and the cellulitis originated on the right lower extremity. She was given a shot of Rocephin. She left [facility] and went to the ED and they switched her to Septra and Keflex. She returned to Dr. Elder Cyphers on the 30th of August and her white count was down to 10. He gave her another shot of Rocephin at that visit.  The right lower extremity  cellulitis has gradually improved. Her lower leg had turned black due to the Silvadene. She has noticed some superficial peeling on her leg. There was not a lot of fluid in her right leg when her symptoms started and she experienced some relief to her symptoms once the fluid was drained from her leg. She is still experiencing some gradually resolving tenderness in her right inner thigh. She is still taking the Bactrim and Keflex.   Patient Active Problem List   Diagnosis Date Noted  . Erysipelas of lower extremity 08/26/2013  . Severe sepsis 08/21/2013  . Lactic acidosis 08/21/2013  . Dog bite of right forearm without complication 62/13/0865  . Endometrial cancer 07/03/2012  . PCOS (polycystic ovarian syndrome) 09/27/2011  . Elevated cholesterol with high triglycerides 03/02/2011  . Asthma 02/27/2011  . Cellulitis, leg 02/26/2011  . Diabetes mellitus type 2, uncontrolled, without complications 78/46/9629  . HTN (hypertension) 02/26/2011  . POLYCYSTIC OVARY 03/08/2006  . GLUCOSE INTOLERANCE 03/08/2006  . OBESITY, NOS 03/08/2006  . ASTHMA, UNSPECIFIED 03/08/2006    Past Medical History  Diagnosis Date  . Diabetes mellitus   . Cellulitis of lower leg     x3 on right leg  . Hypertension   . Irregular menstrual bleeding   .  PCOS (polycystic ovarian syndrome)   . Tachycardia   . Abnormal Pap smear 2000  . Endometrial ca 12/20/2011  . Asthma     no issues in years  . Family history of anesthesia complication     sister had hard time waking up and problems breathing    Past Surgical History  Procedure Laterality Date  . Hysteroscopy w/d&c  01/24/2012    Procedure: DILATATION AND CURETTAGE /HYSTEROSCOPY;  Surgeon: Betsy Coder, MD;  Location: [facility];  Service: Gynecology;  Laterality: N/A;  with vulvar biopsies   . Intrauterine device (iud) insertion  01/24/2012    Procedure: INTRAUTERINE DEVICE (IUD) INSERTION;  Surgeon: Betsy Coder, MD;  Location: [facility];  Service:  Gynecology;  Laterality: N/A;  . Dilation and curettage of uterus    . Robotic assisted total hysterectomy with bilateral salpingo oopherectomy Bilateral 07/02/2012    Procedure: ROBOTIC ASSISTED TOTAL HYSTERECTOMY WITH BILATERAL SALPINGO OOPHORECTOMY ;  Surgeon: Imagene Gurney A. Alycia Rossetti, MD;  Location: [facility];  Service: Gynecology;  Laterality: Bilateral;  . Abdominal hysterectomy      History  Substance Use Topics  . Smoking status: Never Smoker   . Smokeless tobacco: Never Used  . Alcohol Use: Yes     Comment: rare    Family History  Problem Relation Age of Onset  . Diabetes type II Mother   . Coronary artery disease Mother   . Diabetes Mother   . Heart disease Mother   . Hyperlipidemia Mother   . Hypertension Mother   . Diabetes type II Father   . Coronary artery disease Father   . Diabetes Father   . Heart disease Father   . Hyperlipidemia Father   . Heart disease Sister   . Diabetes Sister     No Known Allergies  Medication list has been reviewed and updated.  Current Outpatient Prescriptions on File Prior to Visit  Medication Sig Dispense Refill  . atorvastatin (LIPITOR) 10 MG tablet Take 10 mg by mouth every morning.       . gabapentin (NEURONTIN) 300 MG capsule Take 900 mg by mouth at bedtime.      . insulin aspart (NOVOLOG) 100 UNIT/ML injection Inject 15 Units into the skin 3 (three) times daily with meals. Sliding scale      . insulin glargine (LANTUS) 100 UNIT/ML injection Inject 75 Units into the skin at bedtime.       Marland Kitchen labetalol (NORMODYNE) 200 MG tablet Take 200 mg by mouth 2 (two) times daily.      . Liraglutide (VICTOZA) 18 MG/3ML SOLN Inject 1.8 mg into the skin daily.       Marland Kitchen lisinopril-hydrochlorothiazide (PRINZIDE,ZESTORETIC) 20-12.5 MG per tablet Take 1 tablet by mouth every morning.       . metformin (FORTAMET) 500 MG (OSM) 24 hr tablet Take 1,000 mg by mouth 2 (two) times daily with a meal.       . NON FORMULARY Take 1 capsule by mouth 2 (two) times  daily. nutralite omega 3      . OVER THE COUNTER MEDICATION Take 1 packet by mouth 2 (two) times daily. Nutralite multi  Vitamins      . OVER THE COUNTER MEDICATION Take 1 tablet by mouth 2 (two) times daily. Glucose Health Cromium Piccolate      . oxyCODONE-acetaminophen (PERCOCET/ROXICET) 5-325 MG per tablet Take 1 tablet by mouth every 4 (four) hours as needed for severe pain.  30 tablet  0  . PROAIR HFA  108 (90 BASE) MCG/ACT inhaler Inhale 2 puffs into the lungs every 4 (four) hours as needed for shortness of breath.       . silver sulfADIAZINE (SILVADENE) 1 % cream Apply 1 application topically daily. Apply dime size thickness to affected area daily  50 g  0   No current facility-administered medications on file prior to visit.    Review of Systems:  As per HPI- otherwise negative.   Physical Examination: Filed Vitals:   09/25/13 1849  BP: 128/76  Pulse: 99  Temp: 97.9 F (36.6 C)  Resp: 16   Filed Vitals:   09/25/13 1849  Height: 5\' 5"  (1.651 m)  Weight: 281 lb (127.461 kg)   Body mass index is 46.76 kg/(m^2). Ideal Body Weight: Weight in (lb) to have BMI = 25: 149.9  GEN: WDWN, NAD, Non-toxic, A & O x 3 HEENT: Atraumatic, Normocephalic. Neck supple. No masses, No LAD. Ears and Nose: No external deformity. CV: RRR, No M/G/R. No JVD. No thrill. No extra heart sounds. PULM: CTA B, no wheezes, crackles, rhonchi. No retractions. No resp. distress. No accessory muscle use. EXTR: No c/c/e NEURO Normal gait.  PSYCH: Normally interactive. Conversant. Not depressed or anxious appearing.  Calm demeanor.  Right leg: she has an area of mild redness on the medial part of the right lower leg. Per her report this is much less severe than it had been but does seem to be coming back. She is slightly tender over this area   Assessment and Plan: Cellulitis of leg, right - Plan: sulfamethoxazole-trimethoprim (BACTRIM DS) 800-160 MG per tablet, cefTRIAXone (ROCEPHIN) injection 1 g,  cephALEXin (KEFLEX) 500 MG capsule, DISCONTINUED: cephALEXin (KEFLEX) 500 MG capsule  Possible recurrent cellulitis.  She had sepsis and severe illness the last time this happened.  Will treat with IM rocephin now, keflex and septra. She will follow-up closely if not better in the next few days  Signed Lamar Blinks, MD

## 2013-09-25 NOTE — Patient Instructions (Signed)
Go back on your septra (bactrim) and keflex as directed.  Please let us know if you do not steadily improve over the next few days- Sooner if worse.

## 2013-10-02 NOTE — Addendum Note (Signed)
Addended by: Kem Boroughs D on: 10/02/2013 03:13 PM   Modules accepted: Orders

## 2013-10-08 LAB — CULTURE, FUNGUS WITHOUT SMEAR

## 2013-10-09 ENCOUNTER — Emergency Department (HOSPITAL_BASED_OUTPATIENT_CLINIC_OR_DEPARTMENT_OTHER)
Admission: EM | Admit: 2013-10-09 | Discharge: 2013-10-09 | Disposition: A | Discharge status: Home / Self Care | Payer: BC Managed Care – PPO | Attending: Emergency Medicine | Admitting: Emergency Medicine

## 2013-10-09 ENCOUNTER — Encounter (HOSPITAL_BASED_OUTPATIENT_CLINIC_OR_DEPARTMENT_OTHER): Payer: Self-pay | Admitting: Emergency Medicine

## 2013-10-09 DIAGNOSIS — Z79899 Other long term (current) drug therapy: Secondary | ICD-10-CM | POA: Insufficient documentation

## 2013-10-09 DIAGNOSIS — J45909 Unspecified asthma, uncomplicated: Secondary | ICD-10-CM | POA: Insufficient documentation

## 2013-10-09 DIAGNOSIS — R000 Tachycardia, unspecified: Secondary | ICD-10-CM | POA: Insufficient documentation

## 2013-10-09 DIAGNOSIS — Z8542 Personal history of malignant neoplasm of other parts of uterus: Secondary | ICD-10-CM | POA: Diagnosis not present

## 2013-10-09 DIAGNOSIS — Z792 Long term (current) use of antibiotics: Secondary | ICD-10-CM | POA: Insufficient documentation

## 2013-10-09 DIAGNOSIS — M79605 Pain in left leg: Secondary | ICD-10-CM | POA: Diagnosis present

## 2013-10-09 DIAGNOSIS — E282 Polycystic ovarian syndrome: Secondary | ICD-10-CM | POA: Insufficient documentation

## 2013-10-09 DIAGNOSIS — L03116 Cellulitis of left lower limb: Secondary | ICD-10-CM | POA: Insufficient documentation

## 2013-10-09 DIAGNOSIS — N926 Irregular menstruation, unspecified: Secondary | ICD-10-CM | POA: Diagnosis not present

## 2013-10-09 DIAGNOSIS — Z794 Long term (current) use of insulin: Secondary | ICD-10-CM | POA: Diagnosis not present

## 2013-10-09 DIAGNOSIS — E119 Type 2 diabetes mellitus without complications: Secondary | ICD-10-CM | POA: Insufficient documentation

## 2013-10-09 DIAGNOSIS — I10 Essential (primary) hypertension: Secondary | ICD-10-CM | POA: Diagnosis not present

## 2013-10-09 DIAGNOSIS — L03115 Cellulitis of right lower limb: Secondary | ICD-10-CM

## 2013-10-09 LAB — CBC WITH DIFFERENTIAL/PLATELET
BASOS ABS: 0.1 10*3/uL (ref 0.0–0.1)
Basophils Relative: 1 % (ref 0–1)
EOS ABS: 0.1 10*3/uL (ref 0.0–0.7)
EOS PCT: 1 % (ref 0–5)
HCT: 43.8 % (ref 36.0–46.0)
Hemoglobin: 14.3 g/dL (ref 12.0–15.0)
LYMPHS PCT: 24 % (ref 12–46)
Lymphs Abs: 2.4 10*3/uL (ref 0.7–4.0)
MCH: 32.9 pg (ref 26.0–34.0)
MCHC: 32.6 g/dL (ref 30.0–36.0)
MCV: 100.7 fL — AB (ref 78.0–100.0)
MONO ABS: 1 10*3/uL (ref 0.1–1.0)
Monocytes Relative: 10 % (ref 3–12)
Neutro Abs: 6.3 10*3/uL (ref 1.7–7.7)
Neutrophils Relative %: 64 % (ref 43–77)
Platelets: 290 10*3/uL (ref 150–400)
RBC: 4.35 MIL/uL (ref 3.87–5.11)
RDW: 13.8 % (ref 11.5–15.5)
WBC: 9.9 10*3/uL (ref 4.0–10.5)

## 2013-10-09 LAB — BASIC METABOLIC PANEL
Anion gap: 15 (ref 5–15)
BUN: 21 mg/dL (ref 6–23)
CALCIUM: 9.9 mg/dL (ref 8.4–10.5)
CO2: 25 meq/L (ref 19–32)
CREATININE: 0.6 mg/dL (ref 0.50–1.10)
Chloride: 98 mEq/L (ref 96–112)
GFR calc Af Amer: >90 mL/min (ref >90)
Glucose, Bld: 269 mg/dL — ABNORMAL HIGH (ref 70–99)
Potassium: 4.3 mEq/L (ref 3.7–5.3)
SODIUM: 138 meq/L (ref 137–147)

## 2013-10-09 MED ORDER — CLINDAMYCIN HCL 150 MG PO CAPS
300.0000 mg | ORAL_CAPSULE | Freq: Once | ORAL | Status: AC
  Administered 2013-10-09: 300 mg via ORAL

## 2013-10-09 MED ORDER — CEFTRIAXONE SODIUM 1 G IJ SOLR
INTRAMUSCULAR | Status: AC
  Filled 2013-10-09: qty 10

## 2013-10-09 MED ORDER — CLINDAMYCIN HCL 150 MG PO CAPS
300.0000 mg | ORAL_CAPSULE | Freq: Once | ORAL | Status: DC
  Filled 2013-10-09: qty 2

## 2013-10-09 MED ORDER — OXYCODONE-ACETAMINOPHEN 5-325 MG PO TABS: 1.0000 | ORAL_TABLET | Freq: Four times a day (QID) | ORAL | Status: DC | PRN

## 2013-10-09 MED ORDER — DEXTROSE 5 % IV SOLN
1.0000 g | Freq: Once | INTRAVENOUS | Status: AC
  Administered 2013-10-09: 1 g via INTRAVENOUS

## 2013-10-09 MED ORDER — CLINDAMYCIN HCL 300 MG PO CAPS: 300.0000 mg | ORAL_CAPSULE | Freq: Four times a day (QID) | ORAL | Status: DC

## 2013-10-09 NOTE — ED Notes (Signed)
MD at bedside. 

## 2013-10-09 NOTE — ED Notes (Addendum)
Pt with history of bilateral leg pain, is followed by her pcp at urgent care, completed round of keflex yesterday cellulitis. Awoke at 0430 with upper leg pain and was afraid her cellulitis was getting worse so she came right to hospital

## 2013-10-09 NOTE — ED Provider Notes (Signed)
CSN: [id number]     Arrival date & time 10/09/13  0455 History   First MD Initiated Contact with Patient 10/09/13 [phone]     Chief Complaint  Patient presents with  . Leg Pain     (Consider location/radiation/quality/duration/timing/severity/associated sxs/prior Treatment) Patient is a 38 y.o. female presenting with leg pain.  Leg Pain Location:  Leg Injury: no   Leg location:  R lower leg Pain details:    Quality:  Sharp   Radiates to:  Does not radiate   Severity:  Moderate   Onset quality:  Gradual   Timing:  Constant   Progression:  Worsening Chronicity:  Recurrent Dislocation: no   Tetanus status:  Up to date Relieved by:  Nothing Worsened by:  Nothing tried Ineffective treatments:  None tried Associated symptoms: no fever   Risk factors: no concern for non-accidental trauma   Patient is a 38 year old femele complains of cellulitis of the RLE.She has been seen in the ED and [facility] to same..  She is now complaining of pain higher up on the RLE and enlarging area of redness on the R shin following completing a course of keflex last evening.  This red and tender like previous cellulitis. Also has tenderness in medial to the knee that she reports is suggestive of infection. Cultures reviewed and all have been negative including MRSA serum screen.  Of note has cracking on the feet that she reports she scrubbed with listerine and vinegar   Past Medical History  Diagnosis Date  . Diabetes mellitus   . Cellulitis of lower leg     x3 on right leg  . Hypertension   . Irregular menstrual bleeding   . PCOS (polycystic ovarian syndrome)   . Tachycardia   . Abnormal Pap smear 2000  . Endometrial ca 12/20/2011  . Asthma     no issues in years  . Family history of anesthesia complication     sister had hard time waking up and problems breathing   Past Surgical History  Procedure Laterality Date  . Hysteroscopy w/d&c  01/24/2012    Procedure: DILATATION  AND CURETTAGE /HYSTEROSCOPY;  Surgeon: Betsy Coder, MD;  Location: [facility];  Service: Gynecology;  Laterality: N/A;  with vulvar biopsies   . Intrauterine device (iud) insertion  01/24/2012    Procedure: INTRAUTERINE DEVICE (IUD) INSERTION;  Surgeon: Betsy Coder, MD;  Location: [facility];  Service: Gynecology;  Laterality: N/A;  . Dilation and curettage of uterus    . Robotic assisted total hysterectomy with bilateral salpingo oopherectomy Bilateral 07/02/2012    Procedure: ROBOTIC ASSISTED TOTAL HYSTERECTOMY WITH BILATERAL SALPINGO OOPHORECTOMY ;  Surgeon: Imagene Gurney A. Alycia Rossetti, MD;  Location: [facility];  Service: Gynecology;  Laterality: Bilateral;  . Abdominal hysterectomy     Family History  Problem Relation Age of Onset  . Diabetes type II Mother   . Coronary artery disease Mother   . Diabetes Mother   . Heart disease Mother   . Hyperlipidemia Mother   . Hypertension Mother   . Diabetes type II Father   . Coronary artery disease Father   . Diabetes Father   . Heart disease Father   . Hyperlipidemia Father   . Heart disease Sister   . Diabetes Sister    History  Substance Use Topics  . Smoking status: Never Smoker   . Smokeless tobacco: Never Used  . Alcohol Use: Yes     Comment: rare  OB History   Grav Para Term Preterm Abortions TAB SAB Ect Mult Living   0              Review of Systems  Constitutional: Negative for fever.  Gastrointestinal: Negative for vomiting and diarrhea.  Skin: Positive for color change.  All other systems reviewed and are negative.     Allergies  Review of patient's allergies indicates no known allergies.  Home Medications   Prior to Admission medications   Medication Sig Start Date End Date Taking? Authorizing Provider  atorvastatin (LIPITOR) 10 MG tablet Take 10 mg by mouth every morning.     Historical Provider, MD  cephALEXin (KEFLEX) 500 MG capsule Take 1 capsule (500 mg total) by mouth 4 (four) times daily. 09/25/13   Gay Filler  Copland, MD  gabapentin (NEURONTIN) 300 MG capsule Take 900 mg by mouth at bedtime.    Historical Provider, MD  insulin aspart (NOVOLOG) 100 UNIT/ML injection Inject 15 Units into the skin 3 (three) times daily with meals. Sliding scale    Historical Provider, MD  insulin glargine (LANTUS) 100 UNIT/ML injection Inject 75 Units into the skin at bedtime.  03/01/11   Bobby Rumpf York, PA-C  labetalol (NORMODYNE) 200 MG tablet Take 200 mg by mouth 2 (two) times daily.    Historical Provider, MD  lisinopril-hydrochlorothiazide (PRINZIDE,ZESTORETIC) 20-12.5 MG per tablet Take 1 tablet by mouth every morning.     Historical Provider, MD  metformin (FORTAMET) 500 MG (OSM) 24 hr tablet Take 1,000 mg by mouth 2 (two) times daily with a meal.     Historical Provider, MD  NON FORMULARY Take 1 capsule by mouth 2 (two) times daily. nutralite omega 3    Historical Provider, MD  OVER THE COUNTER MEDICATION Take 1 packet by mouth 2 (two) times daily. Nutralite multi  Vitamins    Historical Provider, MD  OVER THE COUNTER MEDICATION Take 1 tablet by mouth 2 (two) times daily. Glucose Health Cromium Piccolate    Historical Provider, MD  oxyCODONE-acetaminophen (PERCOCET/ROXICET) 5-325 MG per tablet Take 1 tablet by mouth every 4 (four) hours as needed for severe pain. 08/28/13   Wardell Honour, MD  PROAIR HFA 108 (90 BASE) MCG/ACT inhaler Inhale 2 puffs into the lungs every 4 (four) hours as needed for shortness of breath.  11/21/12   Historical Provider, MD  silver sulfADIAZINE (SILVADENE) 1 % cream Apply 1 application topically daily. Apply dime size thickness to affected area daily 09/03/13   Thao P Le, DO  sulfamethoxazole-trimethoprim (BACTRIM DS) 800-160 MG per tablet Take 1 tablet by mouth 2 (two) times daily. 09/25/13   Gay Filler Copland, MD   BP 163/99  Pulse 102  Temp(Src) 99.2 F (37.3 C) (Oral)  Resp 18  Ht 5\' 5"  (1.651 m)  Wt 280 lb (127.007 kg)  BMI 46.59 kg/m2  SpO2 97%  LMP 01/17/2012 Physical Exam    Constitutional: She is oriented to person, place, and time. She appears well-developed and well-nourished. No distress.  HENT:  Head: Normocephalic and atraumatic.  Mouth/Throat: Oropharynx is clear and moist.  Eyes: EOM are normal. Pupils are equal, round, and reactive to light.  Neck: Normal range of motion. Neck supple.  Cardiovascular: Normal rate, regular rhythm and intact distal pulses.   HR in the 80s on EXam  Pulmonary/Chest: Effort normal and breath sounds normal. No respiratory distress. She has no wheezes. She has no rales.  Abdominal: Soft. Bowel sounds are normal. There is no tenderness.  There is no rebound and no guarding.  Musculoskeletal: Normal range of motion.  Neurological: She is alert and oriented to person, place, and time. She has normal reflexes.  Skin: Skin is warm and dry. No petechiae noted. She is not diaphoretic. There is erythema.     Psychiatric: She has a normal mood and affect.    ED Course  Procedures (including critical care time) Labs Review Labs Reviewed  CBC WITH DIFFERENTIAL  BASIC METABOLIC PANEL    Imaging Review No results found.   EKG Interpretation None      MDM   Final diagnoses:  None    Patient is not hypotensive nor is she tachycardiac.  She shows no signs of sepsis at this time. She is known to her nurse Almyra Free who states this area appears markedly improved.  Have administered IV rocephin and will treat with clindamycin QID x 7 days.  Have noted foot cracking and have recommended tinactin spray.  Have advised against scrubbing as this can cause subtle cracking and abrasion and allow bacteria to permeate the skin. Follow up with your PMD in 24 hours for recheck.  Return for fevers, vomiting, weakness, streaking up the leg or any concerns.     Carlisle Beers, MD 10/09/13 [phone]

## 2013-10-15 ENCOUNTER — Encounter: Payer: Self-pay | Admitting: Family Medicine

## 2013-10-15 ENCOUNTER — Ambulatory Visit (INDEPENDENT_AMBULATORY_CARE_PROVIDER_SITE_OTHER): Payer: BC Managed Care – PPO | Admitting: Family Medicine

## 2013-10-15 VITALS — BP 140/88 | HR 108 | Temp 98.2°F | Resp 16 | Ht 65.25 in | Wt 286.6 lb

## 2013-10-15 DIAGNOSIS — E240 Pituitary-dependent Cushing's disease: Secondary | ICD-10-CM

## 2013-10-15 DIAGNOSIS — M7989 Other specified soft tissue disorders: Secondary | ICD-10-CM

## 2013-10-15 DIAGNOSIS — IMO0001 Reserved for inherently not codable concepts without codable children: Secondary | ICD-10-CM

## 2013-10-15 DIAGNOSIS — L03115 Cellulitis of right lower limb: Secondary | ICD-10-CM

## 2013-10-15 DIAGNOSIS — E1165 Type 2 diabetes mellitus with hyperglycemia: Secondary | ICD-10-CM

## 2013-10-15 DIAGNOSIS — I878 Other specified disorders of veins: Secondary | ICD-10-CM

## 2013-10-15 DIAGNOSIS — E669 Obesity, unspecified: Secondary | ICD-10-CM

## 2013-10-15 MED ORDER — ATORVASTATIN CALCIUM 10 MG PO TABS: 10.0000 mg | ORAL_TABLET | Freq: Every day | ORAL | Status: DC

## 2013-10-15 NOTE — Patient Instructions (Signed)
1.  Start Lotrimin twice daily for two weeks. 2.  Elevate leg as much as possible. 3.  Wear compression stocking as much as possible.

## 2013-10-15 NOTE — Progress Notes (Signed)
Subjective:    Patient ID: Kelli Allen, female    DOB: 14-Sep-1975, 38 y.o.   MRN: [id number]  HPI  Presenting for follow up on recurrent cellulitis of right lower extremity. Patient has an extensive history of recurrent cellulitis of approximately 10 years. This episode began in 08/2013 during which she was also hospitalized as she was septic at the time. Patient states that this is the longest she has had this type of infection in her leg without resolution. She has taken multiple trials of antibiotics over the past 2 months. Currently, she is on a course of clindamycin which she will finish tomorrow (10/16/2013).   Today, she reports that her leg is stinging and burning, intermittently feels hot, is achy, remains swollen and discolored. She denies that it is draining or seeping. She also has not had fevers, n/v, diarrhea, chest pain, abdominal pain, dyspnea and shortness of breath. Her pain has been relieved with Percocet although she is worried about taking a narcotic regularly. She tries to elevate her leg and wear compression stockings but does not notice a significant difference with this. Patient denies any other aggravating or relieving factors.  Of note, patient was recently diagnosed with Cushing's syndrome 09/2013. She reports that her ACTH and cortisol levels were extremely high. She has been worked up by an endocrinologist including an MRI and is scheduled for a second procedure this Tuesday (10/21/2013) to biopsy and further evaluate the patient for surgery. Her surgery is to be scheduled for 4-6 weeks pending biopsy results. The patient adamantly feels that this is the root cause of her persistent infection and blood sugar levels and hopes to come off all of her medication following surgery. Patient also feels that although her blood sugar can affect healing in diabetic patients, she states that her blood sugar is high no matter what she eats. She does admit non-compliance with her diet  including eating large portions of sweets. Her blood sugar runs in the 200's but states that it has fallen to the 190's on a few occasions. Kelli Allen also reports that her endocrinologist plans on starting her on Korlym following her procedure next Tuesday to help with both her Cushing's and blood sugar levels. She denies any other questions or concerns.  Review of Systems As in subjective.    Objective:   Physical Exam  Vitals reviewed. Constitutional: She appears well-developed and well-nourished. No distress.  BP 140/88  Pulse 108  Temp(Src) 98.2 F (36.8 C) (Oral)  Resp 16  Ht 5' 5.25" (1.657 m)  Wt 286 lb 9.6 oz (130.001 kg)  BMI 47.35 kg/m2  SpO2 96%  LMP 01/17/2012   Cardiovascular: Regular rhythm and normal heart sounds.  Exam reveals no gallop and no friction rub.   No murmur heard. Pulmonary/Chest: Effort normal and breath sounds normal. She has no wheezes. She has no rales.  Abdominal: Soft. Bowel sounds are normal. She exhibits no distension. There is no tenderness.  Skin: Skin is warm and dry. Rash (asymetric area of cellulitis in right lower extremity, ~15cm at it's widest central area, slight erythema, edemetous up to the knee) noted. She is not diaphoretic.     Psychiatric: She has a normal mood and affect. Her behavior is normal.      Assessment & Plan:   1. Cellulitis of leg, right 2. Right leg swelling Cellulitis is significantly improved from initial presentation in 08/2013 albeit not resolved. Will hold off on another trial of antibiotics d/t current progression of  cellulitis and pending work-up and treatment of Cushing's. Educated patient on signs of worsening infection and advised patient to return to clinic or report to ED. Follow up in 1 week.  [name], PA-C [facility] [phone] 10/15/2013 5:18 PM

## 2013-10-16 ENCOUNTER — Encounter: Payer: Self-pay | Admitting: Family Medicine

## 2013-10-20 ENCOUNTER — Ambulatory Visit (INDEPENDENT_AMBULATORY_CARE_PROVIDER_SITE_OTHER): Payer: BC Managed Care – PPO | Admitting: Family Medicine

## 2013-10-20 ENCOUNTER — Encounter: Payer: Self-pay | Admitting: Family Medicine

## 2013-10-20 VITALS — BP 142/96 | HR 106 | Temp 98.0°F | Resp 18 | Ht 65.25 in | Wt 288.0 lb

## 2013-10-20 DIAGNOSIS — I8311 Varicose veins of right lower extremity with inflammation: Secondary | ICD-10-CM

## 2013-10-20 DIAGNOSIS — IMO0001 Reserved for inherently not codable concepts without codable children: Secondary | ICD-10-CM

## 2013-10-20 DIAGNOSIS — I872 Venous insufficiency (chronic) (peripheral): Secondary | ICD-10-CM

## 2013-10-20 DIAGNOSIS — E1165 Type 2 diabetes mellitus with hyperglycemia: Secondary | ICD-10-CM

## 2013-10-20 DIAGNOSIS — E240 Pituitary-dependent Cushing's disease: Secondary | ICD-10-CM

## 2013-10-20 NOTE — Progress Notes (Signed)
   Subjective:    Patient ID: Kelli Allen, female    DOB: 11-Nov-1975, 38 y.o.   MRN: [id number]  HPI  Kelli Allen is presenting for follow up on Kelli Allen right leg cellulitis. She was last seen here at [facility] on 10/22/2013. Today, she reports that the cellulitis is improving. She admits less edema, erythema, warmth and was able to move around more this past weekend readily admitting that Kelli Allen pain is resolved. She also denies fevers, n/v, diarrhea, sob, fatigue. She is scheduled for Kelli Allen biopsy re: Cushing's Disease tomorrow. No other aggravating or relieving factors.  Review of Systems As in subjective.    Objective:   Physical Exam  Vitals reviewed. Constitutional: She appears well-developed and well-nourished. No distress.  BP 142/96  Pulse 106  Temp(Src) 98 F (36.7 C) (Oral)  Resp 18  Ht 5' 5.25" (1.657 m)  Wt 288 lb (130.636 kg)  BMI 47.58 kg/m2  SpO2 97%  LMP 01/17/2012   Skin: She is not diaphoretic.  Area of discoloration ~10cm in lower right leg, less edematous compared to previous physical exam findings, non-tender, non-erythematous      Assessment & Plan:   Kelli Allen is a 38 y.o. female presenting for follow up on cellulitis of Kelli Allen lower right leg. She shows significant improvement from Kelli Allen previous visit last week. No antibiotics are warranted at this point. Recommend better blood sugar control and further evaluation/follow up regarding Kelli Allen venous stasis and Cushing's Disease. Plan for follow up in 1 week.  1. Venous stasis dermatitis of right lower extremity 2. Diabetes mellitus type 2, uncontrolled, without complications Cellulitis of right leg appears to be resolved, skin discoloration likely more related to venous stasis dermatitis, continue to monitor  3. Cushing's disease Follow up s/p biopsy in 1 week.  [name], PA-C [facility] [phone] 10/20/2013 8:27 PM

## 2013-10-21 ENCOUNTER — Encounter: Payer: Self-pay | Admitting: Family Medicine

## 2013-10-28 NOTE — Progress Notes (Signed)
History and physical examinations obtained with [name], PA-C.  RLE swelling much improved from last visit. Erythema, induration also much improved from previous visit.  Minimal warmth to RLE at area of mild erythema; +mild TTP along lateral lower aspect of RLE.  A/P: Recent severe RLE cellulitis with sepsis requiring hospitalization with persistent inflammatory venous stasis: reassurance provided. Do not feel that additional antibiotics warranted at this time. No evidence of active cellulitis; no active systemic symptoms at this time.  Recommend elevation, compression stockings.  Close follow-up in next week to confirm stability before undergoing procedure to confirm Cushing's diagnosis.  Pt agreeable to plan.  RTC sooner for fever, myalgias, increasing redness or swelling.  Complete Clindamycin therapy tomorrow.

## 2013-10-31 ENCOUNTER — Encounter: Payer: Self-pay | Admitting: Family Medicine

## 2013-10-31 NOTE — Progress Notes (Signed)
History and physical examinations obtain with [name], PA-C.  Agree with assessment and plan.

## 2013-11-04 ENCOUNTER — Encounter: Payer: Self-pay | Admitting: Family Medicine

## 2013-11-04 MED ORDER — FLUCONAZOLE 150 MG PO TABS: 150.0000 mg | ORAL_TABLET | Freq: Once | ORAL | Status: DC

## 2013-11-10 ENCOUNTER — Ambulatory Visit (INDEPENDENT_AMBULATORY_CARE_PROVIDER_SITE_OTHER): Payer: BC Managed Care – PPO | Admitting: Internal Medicine

## 2013-11-10 ENCOUNTER — Telehealth: Payer: Self-pay

## 2013-11-10 VITALS — BP 132/86 | HR 98 | Temp 97.9°F | Resp 18 | Ht 65.25 in | Wt 286.0 lb

## 2013-11-10 DIAGNOSIS — R350 Frequency of micturition: Secondary | ICD-10-CM

## 2013-11-10 DIAGNOSIS — E08620 Diabetes mellitus due to underlying condition with diabetic dermatitis: Secondary | ICD-10-CM

## 2013-11-10 DIAGNOSIS — L03115 Cellulitis of right lower limb: Secondary | ICD-10-CM

## 2013-11-10 LAB — POCT URINALYSIS DIPSTICK
Bilirubin, UA: NEGATIVE
GLUCOSE UA: 500
Ketones, UA: NEGATIVE
LEUKOCYTES UA: NEGATIVE
NITRITE UA: NEGATIVE
Protein, UA: NEGATIVE
RBC UA: NEGATIVE
Spec Grav, UA: 1.01
UROBILINOGEN UA: 0.2
pH, UA: 5

## 2013-11-10 LAB — POCT CBC
GRANULOCYTE PERCENT: 73.7 % (ref 37–80)
HEMATOCRIT: 47.4 % (ref 37.7–47.9)
HEMOGLOBIN: 15.6 g/dL (ref 12.2–16.2)
Lymph, poc: 2.5 (ref 0.6–3.4)
MCH, POC: 32.4 pg — AB (ref 27–31.2)
MCHC: 32.8 g/dL (ref 31.8–35.4)
MCV: 98.6 fL — AB (ref 80–97)
MID (cbc): 0.7 (ref 0–0.9)
MPV: 7.6 fL (ref 0–99.8)
POC Granulocyte: 9 — AB (ref 2–6.9)
POC LYMPH PERCENT: 20.7 %L (ref 10–50)
POC MID %: 5.6 %M (ref 0–12)
Platelet Count, POC: 318 10*3/uL (ref 142–424)
RBC: 4.81 M/uL (ref 4.04–5.48)
RDW, POC: 14.9 %
WBC: 12.2 10*3/uL — AB (ref 4.6–10.2)

## 2013-11-10 LAB — POCT UA - MICROSCOPIC ONLY
CASTS, UR, LPF, POC: NEGATIVE
Crystals, Ur, HPF, POC: NEGATIVE
MUCUS UA: NEGATIVE
YEAST UA: NEGATIVE

## 2013-11-10 LAB — GLUCOSE, POCT (MANUAL RESULT ENTRY): POC Glucose: 219 mg/dl — AB (ref 70–99)

## 2013-11-10 MED ORDER — CEFTRIAXONE SODIUM 1 G IJ SOLR
1.0000 g | Freq: Once | INTRAMUSCULAR | Status: AC
  Administered 2013-11-10: 1 g via INTRAMUSCULAR

## 2013-11-10 MED ORDER — CEPHALEXIN 500 MG PO CAPS: 500.0000 mg | ORAL_CAPSULE | Freq: Four times a day (QID) | ORAL | Status: DC

## 2013-11-10 NOTE — Patient Instructions (Signed)
Kef;lex as directed. Keep leg elevated. Return to the office in 24 hours for re eval. Take temp daily and continue your diabetes meds and monitering with close attention to your diet. Any problems return to the office.

## 2013-11-10 NOTE — Telephone Encounter (Signed)
LM for pt to RTC to see anyone of the providers in regards to this concern.

## 2013-11-10 NOTE — Progress Notes (Signed)
Subjective:    Patient ID: Kelli Allen, female    DOB: 12/01/1975, 38 y.o.   MRN: [id number]  HPI  38 year old female with  cc of pain right lower extremity  Hpi: pain onset this am. It first felt like a pulled muscle in the right innr thigh then she had a few episodes of shooting pain in the inner aspect of the right calf down near the inner ankle.  She is concerned this may be an early sign o fa recurrent infection She has had cellulitus 5 times in this leg the most recent in auguset when she was hospitalized with sepsis.she has been evaluated several times before an never had a dvt in this leg. The last time was in august  Today the Pain quality Is very sharp shooting and throbbing No other radiation Severity 6/10 at its worse Timing onset occurred this am Did have recent sex with her husband last night and thought she may have pulled a muscle in her inner thigh No travel no long trip sor immobility No wound or  Ulceration No increased warmth of redness or swelling No fever   She does have a history of recent cellulitis 08/21/2013 of t he right lower extrem with septic shock(BP 60) with hospitalization for 4 days for iv antibiotics (zosyn and vancomycin) and outpt antibiotics of bactrim clinda and keflex which have been finished.   She does report a recent dx of cushing disease and is schedule d for surgery pituitary in 3 weeks.at [place]  She does not think her leg looks or feel like it did when hse had an infection but she is worried about it and want so have it checked out. She has been having polyuria and polydypsia and her sugars have been running 200-300 but are unchanged.   Review of Systems  Constitutional: Negative.   HENT: Negative.   Eyes: Negative.   Respiratory: Negative.   Cardiovascular: Negative.   Gastrointestinal: Positive for diarrhea (occasional diarhhea no daily/not recnetly/ no odor no abd pain).  Endocrine: Positive for polydipsia, polyphagia  and polyuria.  Genitourinary: Positive for frequency.  Musculoskeletal:       Right leg pain as described in hpi  Skin: Negative.  Negative for color change, rash and wound.  Hematological: Negative.   Psychiatric/Behavioral: Negative.   All other systems reviewed and are negative.      Objective:   Physical Exam  Constitutional: She is oriented to person, place, and time. She appears well-developed and well-nourished.  HENT:  Head: Normocephalic and atraumatic.  Eyes: Conjunctivae and EOM are normal. Pupils are equal, round, and reactive to light.  Neck: Normal range of motion.  Cardiovascular: Normal rate, regular rhythm, normal heart sounds and intact distal pulses.   Pulmonary/Chest: Effort normal and breath sounds normal.  Abdominal: Soft. Bowel sounds are normal.  Musculoskeletal: Normal range of motion. She exhibits no tenderness.  Stasis dermatitis changes in the right lower extrem, non tender on exam no warmth, no ulceration or bruising  Neurological: She is alert and oriented to person, place, and time.  Skin: Skin is warm and dry. No rash noted. No erythema. No pallor.  Dermatitis changes in the lower extrem skin right lower extrem  Psychiatric: She has a normal mood and affect. Her behavior is normal. Judgment and thought content normal.  Nursing note and vitals reviewed.   Results for orders placed or performed in visit on 11/10/13  POCT UA - Microscopic Only  Result Value  Ref Range   WBC, Ur, HPF, POC 0-1    RBC, urine, microscopic 0-1    Bacteria, U Microscopic trace    Mucus, UA neg    Epithelial cells, urine per micros 0-1    Crystals, Ur, HPF, POC neg    Casts, Ur, LPF, POC neg    Yeast, UA neg   POCT urinalysis dipstick  Result Value Ref Range   Color yellow    Clarity, UA clear    Glucose, UA 500    Bilirubin, UA neg    Ketones, UA neg    Spec Grav, UA 1.010    Blood, UA neg    pH, UA 5.0    Protein, UA neg    Urobilinogen, UA 0.2    Nitrite,  UA neg    Leukocytes, UA Negative   POCT CBC  Result Value Ref Range   WBC 12.2 (A) 4.6 - 10.2 K/uL   Lymph, poc 2.5 0.6 - 3.4   POC LYMPH PERCENT 20.7 10 - 50 %L   MID (cbc) 0.7 0 - 0.9   POC MID % 5.6 0 - 12 %M   POC Granulocyte 9.0 (A) 2 - 6.9   Granulocyte percent 73.7 37 - 80 %G   RBC 4.81 4.04 - 5.48 M/uL   Hemoglobin 15.6 12.2 - 16.2 g/dL   HCT, POC 47.4 37.7 - 47.9 %   MCV 98.6 (A) 80 - 97 fL   MCH, POC 32.4 (A) 27 - 31.2 pg   MCHC 32.8 31.8 - 35.4 g/dL   RDW, POC 14.9 %   Platelet Count, POC 318 142 - 424 K/uL   MPV 7.6 0 - 99.8 fL  POCT glucose (manual entry)  Result Value Ref Range   POC Glucose 219 (A) 70 - 99 mg/dl   Wbc is elevated with a shift to the left. Will treat for early cellulitis with antibiotics. ua is normal except for spilling glucuse Sugar is elevated at 219    Assessment & Plan:  cellulitis recurrent right lower extrem . Will give rocephin im in office and treat outpatient with keflex with close follow up.

## 2013-11-10 NOTE — Telephone Encounter (Signed)
Patient calling to ask if Dr. Tamala Julian (patient aware now that dr. Tamala Julian is out of office this week) or another provider can tell her if she needs an office visit for her recurrent cellulitis on her right leg. She is experiencing sharp stabbing sensation on her leg. She says its frequent. She has no fever associated with the pain and see's no change in color or appearance on the area. She is quite concerned  because she does not want another incident like when she went to the ED. Please advise. Thank you   [phone]

## 2013-11-12 ENCOUNTER — Ambulatory Visit (INDEPENDENT_AMBULATORY_CARE_PROVIDER_SITE_OTHER): Payer: BC Managed Care – PPO | Admitting: Emergency Medicine

## 2013-11-12 VITALS — BP 134/80 | HR 94 | Temp 98.1°F | Resp 20 | Ht 65.0 in | Wt 287.6 lb

## 2013-11-12 DIAGNOSIS — L03115 Cellulitis of right lower limb: Secondary | ICD-10-CM

## 2013-11-12 DIAGNOSIS — I87091 Postthrombotic syndrome with other complications of right lower extremity: Secondary | ICD-10-CM

## 2013-11-12 LAB — POCT CBC
Granulocyte percent: 67.9 %G (ref 37–80)
HCT, POC: 47.2 % (ref 37.7–47.9)
Hemoglobin: 14.9 g/dL (ref 12.2–16.2)
Lymph, poc: 2.5 (ref 0.6–3.4)
MCH: 32 pg — AB (ref 27–31.2)
MCHC: 31.7 g/dL — AB (ref 31.8–35.4)
MCV: 100.9 fL — AB (ref 80–97)
MID (CBC): 0.7 (ref 0–0.9)
MPV: 7.7 fL (ref 0–99.8)
PLATELET COUNT, POC: 302 10*3/uL (ref 142–424)
POC Granulocyte: 6.8 (ref 2–6.9)
POC LYMPH %: 25.4 % (ref 10–50)
POC MID %: 6.7 % (ref 0–12)
RBC: 4.68 M/uL (ref 4.04–5.48)
RDW, POC: 15.9 %
WBC: 10 10*3/uL (ref 4.6–10.2)

## 2013-11-12 NOTE — Progress Notes (Signed)
[facility] [address], [place] [address] [phone]  Date:  11/12/2013   Name:  Asharia Lotter   DOB:  [birth year]   MRN:  [id number]  PCP:  Reginia Forts, MD    Chief Complaint: Follow-up   History of Present Illness:  Deni Berti is a 38 y.o. very pleasant female patient who presents with the following:  Patient with cushings scheduled for surgical treatment.  History of recurrent cellulitis right leg and treated two days ago by Dr Benjaman Lobe with keflex Sugars running high 200-300 not responding to insulin No fever or chills.   Pain less. No improvement with over the counter medications or other home remedies.  Denies other complaint or health concern today.   Patient Active Problem List   Diagnosis Date Noted  . Cushing's disease 10/15/2013  . Erysipelas of lower extremity 08/26/2013  . Severe sepsis 08/21/2013  . Lactic acidosis 08/21/2013  . Dog bite of right forearm without complication 40/10/2723  . Endometrial cancer 07/03/2012  . PCOS (polycystic ovarian syndrome) 09/27/2011  . Elevated cholesterol with high triglycerides 03/02/2011  . Asthma 02/27/2011  . Cellulitis, leg 02/26/2011  . Diabetes mellitus type 2, uncontrolled, without complications 36/64/4034  . HTN (hypertension) 02/26/2011  . POLYCYSTIC OVARY 03/08/2006  . GLUCOSE INTOLERANCE 03/08/2006  . Obesity 03/08/2006  . ASTHMA, UNSPECIFIED 03/08/2006    Past Medical History  Diagnosis Date  . Diabetes mellitus   . Cellulitis of lower leg     x3 on right leg  . Hypertension   . Irregular menstrual bleeding   . PCOS (polycystic ovarian syndrome)   . Tachycardia   . Abnormal Pap smear 2000  . Endometrial ca 12/20/2011  . Asthma     no issues in years  . Family history of anesthesia complication     sister had hard time waking up and problems breathing  . Cushing's syndrome 09/25/2013    MRI done on 10/02/2013    Past Surgical History  Procedure Laterality Date   . Hysteroscopy w/d&c  01/24/2012    Procedure: DILATATION AND CURETTAGE /HYSTEROSCOPY;  Surgeon: Betsy Coder, MD;  Location: [facility];  Service: Gynecology;  Laterality: N/A;  with vulvar biopsies   . Intrauterine device (iud) insertion  01/24/2012    Procedure: INTRAUTERINE DEVICE (IUD) INSERTION;  Surgeon: Betsy Coder, MD;  Location: [facility];  Service: Gynecology;  Laterality: N/A;  . Dilation and curettage of uterus    . Robotic assisted total hysterectomy with bilateral salpingo oopherectomy Bilateral 07/02/2012    Procedure: ROBOTIC ASSISTED TOTAL HYSTERECTOMY WITH BILATERAL SALPINGO OOPHORECTOMY ;  Surgeon: Imagene Gurney A. Alycia Rossetti, MD;  Location: [facility];  Service: Gynecology;  Laterality: Bilateral;  . Abdominal hysterectomy      History  Substance Use Topics  . Smoking status: Never Smoker   . Smokeless tobacco: Never Used  . Alcohol Use: 0.0 oz/week    0 Not specified per week     Comment: rare    Family History  Problem Relation Age of Onset  . Diabetes type II Mother   . Coronary artery disease Mother   . Diabetes Mother   . Heart disease Mother   . Hyperlipidemia Mother   . Hypertension Mother   . Diabetes type II Father   . Coronary artery disease Father   . Diabetes Father   . Heart disease Father   . Hyperlipidemia Father   . Heart disease Sister   . Diabetes Sister  No Known Allergies  Medication list has been reviewed and updated.  Current Outpatient Prescriptions on File Prior to Visit  Medication Sig Dispense Refill  . atorvastatin (LIPITOR) 10 MG tablet Take 1 tablet (10 mg total) by mouth daily at 6 PM. 90 tablet 3  . cephALEXin (KEFLEX) 500 MG capsule Take 1 capsule (500 mg total) by mouth 4 (four) times daily. 40 capsule 0  . clindamycin (CLEOCIN) 300 MG capsule Take 1 capsule (300 mg total) by mouth 4 (four) times daily. X 7 days 28 capsule 0  . fluconazole (DIFLUCAN) 150 MG tablet Take 1 tablet (150 mg total) by mouth once. 2 tablet 0  .  gabapentin (NEURONTIN) 300 MG capsule Take 900 mg by mouth at bedtime.    . insulin aspart (NOVOLOG) 100 UNIT/ML injection Inject 15 Units into the skin 3 (three) times daily with meals. Sliding scale    . insulin glargine (LANTUS) 100 UNIT/ML injection Inject 75 Units into the skin at bedtime.     Marland Kitchen labetalol (NORMODYNE) 200 MG tablet Take 200 mg by mouth 2 (two) times daily.    Marland Kitchen lisinopril-hydrochlorothiazide (PRINZIDE,ZESTORETIC) 20-12.5 MG per tablet Take 1 tablet by mouth every morning.     . metformin (FORTAMET) 500 MG (OSM) 24 hr tablet Take 1,000 mg by mouth 2 (two) times daily with a meal.     . NON FORMULARY Take 1 capsule by mouth 2 (two) times daily. nutralite omega 3    . OVER THE COUNTER MEDICATION Take 1 packet by mouth 2 (two) times daily. Nutralite multi  Vitamins    . OVER THE COUNTER MEDICATION Take 1 tablet by mouth 2 (two) times daily. Glucose Health Cromium Piccolate    . OVER THE COUNTER MEDICATION 8,000 Int'l Units.    Marland Kitchen oxyCODONE-acetaminophen (PERCOCET) 5-325 MG per tablet Take 1-2 tablets by mouth every 6 (six) hours as needed. 13 tablet 0  . oxyCODONE-acetaminophen (PERCOCET/ROXICET) 5-325 MG per tablet Take 1 tablet by mouth every 4 (four) hours as needed for severe pain. 30 tablet 0  . PROAIR HFA 108 (90 BASE) MCG/ACT inhaler Inhale 2 puffs into the lungs every 4 (four) hours as needed for shortness of breath.     . Probiotic Product (PROBIOTIC DAILY PO) Take by mouth.    . silver sulfADIAZINE (SILVADENE) 1 % cream Apply 1 application topically daily. Apply dime size thickness to affected area daily 50 g 0  . sulfamethoxazole-trimethoprim (BACTRIM DS) 800-160 MG per tablet Take 1 tablet by mouth 2 (two) times daily. 20 tablet 0   No current facility-administered medications on file prior to visit.    Review of Systems:  As per HPI, otherwise negative.    Physical Examination: Filed Vitals:   11/12/13 1836  BP: 134/80  Pulse: 94  Temp: 98.1 F (36.7 C)   Resp: 20   Filed Vitals:   11/12/13 1836  Height: 5\' 5"  (1.651 m)  Weight: 287 lb 9.6 oz (130.455 kg)   Body mass index is 47.86 kg/(m^2). Ideal Body Weight: Weight in (lb) to have BMI = 25: 149.9   GEN: WDWN, NAD, Non-toxic, Alert & Oriented x 3 HEENT: Atraumatic, Normocephalic.  Ears and Nose: No external deformity. EXTR: No clubbing/cyanosis NEURO: Normal gait.  PSYCH: Normally interactive. Conversant. Not depressed or anxious appearing.  Calm demeanor.  RIGHT calf:  Postphlebitic changes that may be secondarily infected   Assessment and Plan: Post phlebitic dermatitis Cellulitis  Signed,  Ellison Carwin, MD   Results for orders  placed or performed in visit on 11/12/13  POCT CBC  Result Value Ref Range   WBC 10.0 4.6 - 10.2 K/uL   Lymph, poc 2.5 0.6 - 3.4   POC LYMPH PERCENT 25.4 10 - 50 %L   MID (cbc) 0.7 0 - 0.9   POC MID % 6.7 0 - 12 %M   POC Granulocyte 6.8 2 - 6.9   Granulocyte percent 67.9 37 - 80 %G   RBC 4.68 4.04 - 5.48 M/uL   Hemoglobin 14.9 12.2 - 16.2 g/dL   HCT, POC 47.2 37.7 - 47.9 %   MCV 100.9 (A) 80 - 97 fL   MCH, POC 32.0 (A) 27 - 31.2 pg   MCHC 31.7 (A) 31.8 - 35.4 g/dL   RDW, POC 15.9 %   Platelet Count, POC 302 142 - 424 K/uL   MPV 7.7 0 - 99.8 fL

## 2013-11-12 NOTE — Patient Instructions (Signed)

## 2013-11-17 ENCOUNTER — Encounter: Payer: Self-pay | Admitting: Family Medicine

## 2013-11-25 DIAGNOSIS — E237 Disorder of pituitary gland, unspecified: Secondary | ICD-10-CM | POA: Insufficient documentation

## 2013-11-26 DIAGNOSIS — E249 Cushing's syndrome, unspecified: Secondary | ICD-10-CM

## 2013-11-26 HISTORY — DX: Cushing's syndrome, unspecified: E24.9

## 2013-11-26 HISTORY — PX: OTHER SURGICAL HISTORY: SHX169

## 2013-11-27 ENCOUNTER — Ambulatory Visit: Payer: BC Managed Care – PPO | Admitting: Gynecologic Oncology

## 2013-12-03 ENCOUNTER — Encounter: Payer: Self-pay | Admitting: Family Medicine

## 2013-12-17 ENCOUNTER — Encounter: Payer: Self-pay | Admitting: Gynecologic Oncology

## 2013-12-17 ENCOUNTER — Ambulatory Visit: Payer: BC Managed Care – PPO | Attending: Gynecologic Oncology | Admitting: Gynecologic Oncology

## 2013-12-17 ENCOUNTER — Other Ambulatory Visit (HOSPITAL_COMMUNITY)
Admission: RE | Admit: 2013-12-17 | Discharge: 2013-12-17 | Disposition: A | Discharge status: Home / Self Care | Payer: BC Managed Care – PPO | Source: Ambulatory Visit | Attending: Gynecologic Oncology | Admitting: Gynecologic Oncology

## 2013-12-17 VITALS — BP 115/71 | HR 106 | Temp 98.0°F | Resp 20 | Ht 65.0 in | Wt 283.8 lb

## 2013-12-17 DIAGNOSIS — Z9079 Acquired absence of other genital organ(s): Secondary | ICD-10-CM | POA: Insufficient documentation

## 2013-12-17 DIAGNOSIS — K649 Unspecified hemorrhoids: Secondary | ICD-10-CM | POA: Diagnosis not present

## 2013-12-17 DIAGNOSIS — J45909 Unspecified asthma, uncomplicated: Secondary | ICD-10-CM | POA: Insufficient documentation

## 2013-12-17 DIAGNOSIS — Z794 Long term (current) use of insulin: Secondary | ICD-10-CM | POA: Diagnosis not present

## 2013-12-17 DIAGNOSIS — E119 Type 2 diabetes mellitus without complications: Secondary | ICD-10-CM | POA: Insufficient documentation

## 2013-12-17 DIAGNOSIS — Z90722 Acquired absence of ovaries, bilateral: Secondary | ICD-10-CM | POA: Insufficient documentation

## 2013-12-17 DIAGNOSIS — Z90710 Acquired absence of both cervix and uterus: Secondary | ICD-10-CM | POA: Diagnosis not present

## 2013-12-17 DIAGNOSIS — Z01419 Encounter for gynecological examination (general) (routine) without abnormal findings: Secondary | ICD-10-CM | POA: Insufficient documentation

## 2013-12-17 DIAGNOSIS — Z79899 Other long term (current) drug therapy: Secondary | ICD-10-CM | POA: Insufficient documentation

## 2013-12-17 DIAGNOSIS — C541 Malignant neoplasm of endometrium: Secondary | ICD-10-CM

## 2013-12-17 DIAGNOSIS — C569 Malignant neoplasm of unspecified ovary: Secondary | ICD-10-CM | POA: Diagnosis not present

## 2013-12-17 DIAGNOSIS — Z8542 Personal history of malignant neoplasm of other parts of uterus: Secondary | ICD-10-CM

## 2013-12-17 DIAGNOSIS — E249 Cushing's syndrome, unspecified: Secondary | ICD-10-CM | POA: Insufficient documentation

## 2013-12-17 DIAGNOSIS — I10 Essential (primary) hypertension: Secondary | ICD-10-CM | POA: Diagnosis not present

## 2013-12-17 NOTE — Progress Notes (Signed)
Consult Note: Gyn-Onc  Kelli Allen 38 y.o. female with history of a stage IA grade 1 endometrioid adenocarcinoma who has no evidence of recurrent disease. We'll follow-up in results for Pap smear from today. Return to see me in 6 months or when necessary.  CC:  Chief Complaint  Patient presents with  . endometrial cancer    HPI:  Patient is a 38 year old gravida 0 para 0 who remembers having irregular periods in which he passed some tissue in October of 2012. She was seen by Dr. Charlesetta Garibaldi and apparently Dr. Charlesetta Garibaldi wanted to perform a history of sonogram but the patient declined. She was seen in followup with Dr. Charlesetta Garibaldi in September was started on birth control pills. In October or November she had some spotting and it was fairly regular for a few days. She ultimately underwent a hysteroscopy D&C that Dr. Charlesetta Garibaldi and 1 he to perform and underwent an endometrial biopsy on November 7. [name] 7-year-old a polyp. The uterus measures 8.04 x 4.3 cm with normal appearing adnexa. There was a 1.2 cm polyp noted. She had an endometrial biopsy that revealed a grade 1 endometrial carcinoma arising atypical complex hyperplasia. The patient wishes to retain her fertility and subsequent comes in today still see [place].  She underwent a D&C IUD placement with Dr. Charlesetta Garibaldi on January 24, 2012. Pathology revealed an endometrioid adenocarcinoma grade 1 arising in a background of atypical complex hyperplasia. She also had some benign melanotic nevi removed at that time.  She got a second opinion with Dr. Gaetano Net who told her that the most definitive treatment would be hysterectomy.    Review of Systems: I performed an endometrial biopsy on her in April 2014 that revealed persistent adenocarcinoma. She decided she wanted to proceed with hysterectomy.   On 07/02/2012 showing a robotic hysterectomy bilateral salpingo-oophorectomy. Pathology revealed: Diagnosis Uterus +/- tubes/ovaries, neoplastic, with  cervix - ENDOMETRIOID CARCINOMA, FIGO GRADE I. - BACKGROUND ATYPICAL COMPLEX HYPERPLASIA AND A ENDOMETRIAL POLYP. PLEASE SEE COMMENT. - BILATERAL OVARIES AND FALLOPIAN TUBES: NO EVIDENCE OF ATYPIA OR MALIGNANCY. - CERVIX: BENIGN SQUAMOUS MUCOSA AND ENDOCERVICAL MUCOSA, NO DYSPLASIA OR MALIGNANCY. - LEIOMYOMA.  She was last seen by Dr. Fermin Schwab in May of this year. At that time she had some bleeding that was temporally related to intercourse. Her exam was unremarkable. Since she was last seen by her surgeon this, she was diagnosed with Cushing's disease and underwent removal of a tumor pituitary gland on November 18. She states that her ACTH and cortisol levels were incredibly high and since her tumor was removed they have come this completely normalized. In August of year's year she was admitted for about 4 days with increasing cellulitis in her right leg and acute sepsis. She states at that time is when the workup proceeded. Her sugars continue to be high but she is on her prednisone currently. She is currently being managed at the [facility]. She hopes that her weight will start coming down in the near future. She denies any vaginal bleeding but complains of vaginitis. She has some white discharge. She was recently seen and did not have a yeast infection. She uses nystatin when necessary she's also complaining of rectal pain. We discussed that she due to her bowel changes and that she's having some increased diarrhea she admits to maybe wiping to hard but she will like [place] to evaluate that today. She has been on antibiotics fairly consistently since August 2015 through October 2015 and has  had changes in her bowel movement since that time.   Review of Systems  Constitutional: Denies fever. Skin: No rash, + itching Cardiovascular: No chest pain, shortness of breath, + LE edema R>L Pulmonary: No cough Gastro Intestinal:  No nausea, vomiting, constipation, +diarrhea reported.   Genitourinary: No frequency, urgency, or dysuria.  Denies vaginal bleeding+ white discharge.  Psychology: Feeling more hopeful   Current Meds:  Outpatient Encounter Prescriptions as of 12/17/2013  Medication Sig  . atorvastatin (LIPITOR) 10 MG tablet Take 1 tablet (10 mg total) by mouth daily at 6 PM.  . BAYER CONTOUR TEST test strip   . Ergocalciferol (VITAMIN D2) 2000 UNITS TABS Take 1 tablet by mouth.  . gabapentin (NEURONTIN) 300 MG capsule Take 900 mg by mouth at bedtime.  . insulin aspart (NOVOLOG FLEXPEN) 100 UNIT/ML FlexPen Inject 15 units with a correction 1:50 > 150 three times daily before meals  . insulin aspart (NOVOLOG) 100 UNIT/ML injection Inject 15 Units into the skin 3 (three) times daily with meals. Sliding scale  . Insulin Glargine (LANTUS SOLOSTAR) 100 UNIT/ML Solostar Pen Inject 70 Units into the skin.  Marland Kitchen labetalol (NORMODYNE) 200 MG tablet Take 200 mg by mouth 2 (two) times daily.  . Liraglutide (VICTOZA) 18 MG/3ML SOPN Inject 1.8 mg into the skin.  Marland Kitchen lisinopril-hydrochlorothiazide (PRINZIDE,ZESTORETIC) 20-12.5 MG per tablet Take 1 tablet by mouth every morning.   . metformin (FORTAMET) 500 MG (OSM) 24 hr tablet Take 1,000 mg by mouth 2 (two) times daily with a meal.   . Multiple Vitamin (MULTIVITAMIN) capsule Take 1 capsule by mouth.  . NON FORMULARY Take 1 capsule by mouth 2 (two) times daily. nutralite omega 3  . Omega-3 1000 MG CAPS Take 3 g by mouth.  Marland Kitchen OVER THE COUNTER MEDICATION Take 1 packet by mouth 2 (two) times daily. Nutralite multi  Vitamins  . OVER THE COUNTER MEDICATION Take 1 tablet by mouth 2 (two) times daily. Glucose Health Cromium Piccolate  . OVER THE COUNTER MEDICATION 8,000 Int'l Units.  . predniSONE (DELTASONE) 10 MG tablet 5 mg.  . PROAIR HFA 108 (90 BASE) MCG/ACT inhaler Inhale 2 puffs into the lungs every 4 (four) hours as needed for shortness of breath.   . Probiotic Product (PROBIOTIC DAILY PO) Take by mouth.  . ranitidine (ZANTAC) 150  MG tablet Take 150 mg by mouth.  Marland Kitchen UNABLE TO FIND cataplex g and cataplex gtf. Pt takes each supplement three times daily.  Marland Kitchen UNABLE TO FIND Liver health. 1 tablet by mouth twice daily.  . [DISCONTINUED] atorvastatin (LIPITOR) 10 MG tablet   . fluconazole (DIFLUCAN) 150 MG tablet Take 1 tablet (150 mg total) by mouth once. (Patient not taking: Reported on 12/17/2013)  . senna-docusate (SENOKOT-S) 8.6-50 MG per tablet Take 2 tablets by mouth.  . silver sulfADIAZINE (SILVADENE) 1 % cream Apply 1 application topically daily. Apply dime size thickness to affected area daily (Patient not taking: Reported on 12/17/2013)  . [DISCONTINUED] atorvastatin (LIPITOR) 20 MG tablet   . [DISCONTINUED] cephALEXin (KEFLEX) 500 MG capsule Take 1 capsule (500 mg total) by mouth 4 (four) times daily.  . [DISCONTINUED] clindamycin (CLEOCIN) 300 MG capsule Take 1 capsule (300 mg total) by mouth 4 (four) times daily. X 7 days  . [DISCONTINUED] gabapentin (NEURONTIN) 300 MG capsule Take 900 mg by mouth.  . [DISCONTINUED] insulin glargine (LANTUS) 100 UNIT/ML injection Inject 75 Units into the skin at bedtime.   . [DISCONTINUED] metFORMIN (GLUCOPHAGE) 500 MG tablet Take 1,000 mg  by mouth.  . [DISCONTINUED] oxyCODONE-acetaminophen (PERCOCET) 5-325 MG per tablet Take 1-2 tablets by mouth every 6 (six) hours as needed.  . [DISCONTINUED] oxyCODONE-acetaminophen (PERCOCET/ROXICET) 5-325 MG per tablet Take 1 tablet by mouth every 4 (four) hours as needed for severe pain.  . [DISCONTINUED] sulfamethoxazole-trimethoprim (BACTRIM DS) 800-160 MG per tablet Take 1 tablet by mouth 2 (two) times daily.    Allergy:  Allergies  Allergen Reactions  . Antibacterial Hand Soap [Triclosan] Other (See Comments) and Rash    Dry, itchy patches on skin    Social Hx:  She denies tobacco. She drinks alcohol socially she has a drug use. Her husband is with an Insurance claims handler but she is married. She works in Therapist, art for  [employer].  History   Social History  . Marital Status: Married    Spouse Name: N/A    Number of Children: N/A  . Years of Education: N/A   Occupational History  . Not on file.   Social History Main Topics  . Smoking status: Never Smoker   . Smokeless tobacco: Never Used  . Alcohol Use: 0.0 oz/week    0 Not specified per week     Comment: rare  . Drug Use: No  . Sexual Activity: Yes    Birth Control/ Protection: IUD     Comment: mirena    Other Topics Concern  . Not on file   Social History Narrative    Past Surgical Hx:  Past Surgical History  Procedure Laterality Date  . Hysteroscopy w/d&c  01/24/2012    Procedure: DILATATION AND CURETTAGE /HYSTEROSCOPY;  Surgeon: Betsy Coder, MD;  Location: [facility];  Service: Gynecology;  Laterality: N/A;  with vulvar biopsies   . Intrauterine device (iud) insertion  01/24/2012    Procedure: INTRAUTERINE DEVICE (IUD) INSERTION;  Surgeon: Betsy Coder, MD;  Location: [facility];  Service: Gynecology;  Laterality: N/A;  . Dilation and curettage of uterus    . Robotic assisted total hysterectomy with bilateral salpingo oopherectomy Bilateral 07/02/2012    Procedure: ROBOTIC ASSISTED TOTAL HYSTERECTOMY WITH BILATERAL SALPINGO OOPHORECTOMY ;  Surgeon: Imagene Gurney A. Alycia Rossetti, MD;  Location: [facility];  Service: Gynecology;  Laterality: Bilateral;  . Abdominal hysterectomy      Past Medical Hx:  Past Medical History  Diagnosis Date  . Diabetes mellitus   . Cellulitis of lower leg     x3 on right leg  . Hypertension   . Irregular menstrual bleeding   . PCOS (polycystic ovarian syndrome)   . Tachycardia   . Abnormal Pap smear 2000  . Endometrial ca 12/20/2011  . Asthma     no issues in years  . Family history of anesthesia complication     sister had hard time waking up and problems breathing  . Cushing's syndrome 09/25/2013    MRI done on 10/02/2013    Family Hx: She is one of 7 girls. Although 2 of her sisters have depression. She  has one sister with mitochondrial myopathy. Her paternal grandfather died of lung cancer she was a smoker. Maternal grandmother may have had bone "cancer" Family History  Problem Relation Age of Onset  . Diabetes type II Mother   . Coronary artery disease Mother   . Diabetes Mother   . Heart disease Mother   . Hyperlipidemia Mother   . Hypertension Mother   . Diabetes type II Father   . Coronary artery disease Father   . Diabetes Father   . Heart disease  Father   . Hyperlipidemia Father   . Heart disease Sister   . Diabetes Sister     Vitals:  Blood pressure 115/71, pulse 106, temperature 98 F (36.7 C), temperature source Oral, resp. rate 20, height 5\' 5"  (1.651 m), weight 283 lb 12.8 oz (128.731 kg), last menstrual period 01/17/2012.  Physical Exam:  Well-nourished well-developed female in no acute distress.   Neck: Supple, no lymphadenopathy, no thyromegaly.  Lungs: Clear to auscultation bilaterally.  Cardiovascular: Regular rate and rhythm.  Abdomen: Morbidly obese. Well-healed surgical incisions. Fresh incision in the right lower quadrant..  Groins: No lymphadenopathy. Exam limited by habitus..  Extremities: Chronic venous stasis changes in the right lower extremity. 2+ edema right lower extremity 1+ edema left lower extremity.  Pelvic: Normal female genitalia. Vagina is well epithelialized. There is no discharge. The vaginal cuff is difficult to visualize secondary to habitus. There are no gross visible lesions. Pap smear was submitted without difficulty. Bimanual examination reveals no masses or nodularity. However, exam is limited by habitus. The perirectal area was inspected. There is generalized erythema. There are no fissures. There is a small hemorrhoid at 5:00.   Assessment/Plan: 38 year old gravida 0 with a grade 1 endometrial carcinoma.  She is status post definitive surgery in June 2014 has no evidence recurrent disease. We will follow-up in results for Pap  smear from today. I placed an order for her to have a screening mammogram performed. She will continue using her nystatin when necessary. We discussed that she can use Preparation H witch hazel on the small hemorrhoid. She was also encouraged she to use wet wipes after a bowel movement to cut down on the irritation. She can also introduce some live culture yogurt into her diet. She's taking a probiotic. She will return to see me in 6 months or when necessary    Kamariya Blevens A., MD 12/17/2013, 11:39 AM

## 2013-12-17 NOTE — Patient Instructions (Signed)
We will notify you of the results of your Pap smear. Please return to see GYN oncology in 6 months.

## 2013-12-19 ENCOUNTER — Ambulatory Visit (INDEPENDENT_AMBULATORY_CARE_PROVIDER_SITE_OTHER): Payer: BC Managed Care – PPO | Admitting: Family Medicine

## 2013-12-19 ENCOUNTER — Telehealth: Payer: Self-pay | Admitting: Nurse Practitioner

## 2013-12-19 VITALS — BP 134/80 | HR 96 | Temp 97.9°F | Resp 20 | Ht 65.0 in | Wt 287.0 lb

## 2013-12-19 DIAGNOSIS — L03115 Cellulitis of right lower limb: Secondary | ICD-10-CM

## 2013-12-19 DIAGNOSIS — E249 Cushing's syndrome, unspecified: Secondary | ICD-10-CM

## 2013-12-19 DIAGNOSIS — J0110 Acute frontal sinusitis, unspecified: Secondary | ICD-10-CM

## 2013-12-19 LAB — CYTOLOGY - PAP

## 2013-12-19 MED ORDER — AMOXICILLIN-POT CLAVULANATE 875-125 MG PO TABS: 1.0000 | ORAL_TABLET | Freq: Two times a day (BID) | ORAL | Status: DC

## 2013-12-19 NOTE — Patient Instructions (Signed)
Good to see you today- I'm glad that you got through your surgery so well! We are going to use augmentin 875 twice a day for 10 days for your sinuses and also to head off any infection in your leg. If you do have shortness or breath that persists or feel worse in any way please let me know.  I am happy to order the D dimer or CT of your chest if you like.   Take care and let me know if you do not feel better soon!   Continue to use your compression hose and try a good thick Moisturin cream on your skin- avoid lotions as they may contain alcohol

## 2013-12-19 NOTE — Telephone Encounter (Signed)
Patient informed pap result is normal. Patient understands and thanks for the call.

## 2013-12-19 NOTE — Progress Notes (Signed)
[facility] [address] [place] [address] [phone]  Date:  12/19/2013   Name:  Kelli Allen   DOB:  13-Jun-1975   MRN:  [id number]  PCP:  Reginia Forts, MD    Chief Complaint: Rash; Cough; and Hoarse   History of Present Illness:  Kelli Allen is a 38 y.o. very pleasant female patient who presents with the following:  She is here today with a few concerns. She has noted a rash on her upper arms for a few weeks, it is itchy. No other rash   She has been dx with cushings, had a pituitary tumor removed about 3 weeks ago.  She is on prednisone following this operation, currently on 5mg .  This has caused her glucose to reach 400 over a week ago, more recently in the 200s. She is frustrated that her glucose has been so high recently but is hopeful that she will soon see some major improvement.     She has "felt like I probably had a sinus infection for the last week or week and a half."  Sinus congestion and painShe now also has some productive cough as of today.   She also has noted some swelling of her bilateral legs over the last week.  This got better when she was out of work following surgery but the swelling is now back.  She is wearing her support hose again but does not really think that it helps.  Her main concern here is some chronic cellulitis of her right anterior shin that is worse when she has more swelling.  She has a chronically hyperpigmented area on the right that seems more red and irritated as of the last few days.  She is concerned that her cellulitis is returning   She has not measured a fever.  Cough is mild. She has more sinus drainage- she thinks this has triggered the cough.   She is not supposed to let herself sneeze or blow her nose due to her recent surgery. Her throat is sore as well  Patient Active Problem List   Diagnosis Date Noted  . Cushing's disease 10/15/2013  . Erysipelas of lower extremity 08/26/2013  . Severe sepsis  08/21/2013  . Lactic acidosis 08/21/2013  . Dog bite of right forearm without complication 78/24/2353  . Endometrial cancer 07/03/2012  . PCOS (polycystic ovarian syndrome) 09/27/2011  . Elevated cholesterol with high triglycerides 03/02/2011  . Asthma 02/27/2011  . Cellulitis, leg 02/26/2011  . Diabetes mellitus type 2, uncontrolled, without complications 61/44/3154  . HTN (hypertension) 02/26/2011  . POLYCYSTIC OVARY 03/08/2006  . GLUCOSE INTOLERANCE 03/08/2006  . Obesity 03/08/2006  . ASTHMA, UNSPECIFIED 03/08/2006    Past Medical History  Diagnosis Date  . Diabetes mellitus   . Cellulitis of lower leg     x3 on right leg  . Hypertension   . Irregular menstrual bleeding   . PCOS (polycystic ovarian syndrome)   . Tachycardia   . Abnormal Pap smear 2000  . Endometrial ca 12/20/2011  . Asthma     no issues in years  . Family history of anesthesia complication     sister had hard time waking up and problems breathing  . Cushing's syndrome 09/25/2013    MRI done on 10/02/2013    Past Surgical History  Procedure Laterality Date  . Hysteroscopy w/d&c  01/24/2012    Procedure: DILATATION AND CURETTAGE /HYSTEROSCOPY;  Surgeon: Betsy Coder, MD;  [facility];  Service:  Gynecology;  Laterality: N/A;  with vulvar biopsies   . Intrauterine device (iud) insertion  01/24/2012    Procedure: INTRAUTERINE DEVICE (IUD) INSERTION;  Surgeon: Betsy Coder, MD;  Location: [facility];  Service: Gynecology;  Laterality: N/A;  . Dilation and curettage of uterus    . Robotic assisted total hysterectomy with bilateral salpingo oopherectomy Bilateral 07/02/2012    Procedure: ROBOTIC ASSISTED TOTAL HYSTERECTOMY WITH BILATERAL SALPINGO OOPHORECTOMY ;  Surgeon: Imagene Gurney A. Alycia Rossetti, MD;  Location: [facility];  Service: Gynecology;  Laterality: Bilateral;  . Abdominal hysterectomy      History  Substance Use Topics  . Smoking status: Never Smoker   . Smokeless tobacco: Never Used  . Alcohol Use: 0.0  oz/week    0 Not specified per week     Comment: rare    Family History  Problem Relation Age of Onset  . Diabetes type II Mother   . Coronary artery disease Mother   . Diabetes Mother   . Heart disease Mother   . Hyperlipidemia Mother   . Hypertension Mother   . Diabetes type II Father   . Coronary artery disease Father   . Diabetes Father   . Heart disease Father   . Hyperlipidemia Father   . Heart disease Sister   . Diabetes Sister     Allergies  Allergen Reactions  . Antibacterial Hand Soap [Triclosan] Other (See Comments) and Rash    Dry, itchy patches on skin    Medication list has been reviewed and updated.  Current Outpatient Prescriptions on File Prior to Visit  Medication Sig Dispense Refill  . atorvastatin (LIPITOR) 10 MG tablet Take 1 tablet (10 mg total) by mouth daily at 6 PM. 90 tablet 3  . BAYER CONTOUR TEST test strip   98  . Ergocalciferol (VITAMIN D2) 2000 UNITS TABS Take 1 tablet by mouth.    . fluconazole (DIFLUCAN) 150 MG tablet Take 1 tablet (150 mg total) by mouth once. 2 tablet 0  . gabapentin (NEURONTIN) 300 MG capsule Take 900 mg by mouth at bedtime.    . insulin aspart (NOVOLOG FLEXPEN) 100 UNIT/ML FlexPen Inject 15 units with a correction 1:50 > 150 three times daily before meals    . insulin aspart (NOVOLOG) 100 UNIT/ML injection Inject 15 Units into the skin 3 (three) times daily with meals. Sliding scale    . Insulin Glargine (LANTUS SOLOSTAR) 100 UNIT/ML Solostar Pen Inject 70 Units into the skin.    Marland Kitchen labetalol (NORMODYNE) 200 MG tablet Take 200 mg by mouth 2 (two) times daily.    . Liraglutide (VICTOZA) 18 MG/3ML SOPN Inject 1.8 mg into the skin.    Marland Kitchen lisinopril-hydrochlorothiazide (PRINZIDE,ZESTORETIC) 20-12.5 MG per tablet Take 1 tablet by mouth every morning.     . metformin (FORTAMET) 500 MG (OSM) 24 hr tablet Take 1,000 mg by mouth 2 (two) times daily with a meal.     . Multiple Vitamin (MULTIVITAMIN) capsule Take 1 capsule by mouth.     . NON FORMULARY Take 1 capsule by mouth 2 (two) times daily. nutralite omega 3    . Omega-3 1000 MG CAPS Take 3 g by mouth.    Marland Kitchen OVER THE COUNTER MEDICATION Take 1 packet by mouth 2 (two) times daily. Nutralite multi  Vitamins    . OVER THE COUNTER MEDICATION Take 1 tablet by mouth 2 (two) times daily. Glucose Health Cromium Piccolate    . OVER THE COUNTER MEDICATION 8,000 Int'l Units.    Marland Kitchen  predniSONE (DELTASONE) 10 MG tablet 5 mg.  1  . PROAIR HFA 108 (90 BASE) MCG/ACT inhaler Inhale 2 puffs into the lungs every 4 (four) hours as needed for shortness of breath.     . Probiotic Product (PROBIOTIC DAILY PO) Take by mouth.    . ranitidine (ZANTAC) 150 MG tablet Take 150 mg by mouth.    . senna-docusate (SENOKOT-S) 8.6-50 MG per tablet Take 2 tablets by mouth.    . silver sulfADIAZINE (SILVADENE) 1 % cream Apply 1 application topically daily. Apply dime size thickness to affected area daily 50 g 0  . UNABLE TO FIND cataplex g and cataplex gtf. Pt takes each supplement three times daily.    Marland Kitchen UNABLE TO FIND Liver health. 1 tablet by mouth twice daily.     No current facility-administered medications on file prior to visit.    Review of Systems:  As per HPI- otherwise negative.   Physical Examination: Filed Vitals:   12/19/13 1724  BP: 134/80  Pulse: 121  Temp: 97.9 F (36.6 C)  Resp: 20   Filed Vitals:   12/19/13 1724  Height: 5\' 5"  (6.286 m)  Weight: 287 lb (130.182 kg)   Body mass index is 47.76 kg/(m^2). Ideal Body Weight: Weight in (lb) to have BMI = 25: 149.9  GEN: WDWN, NAD, Non-toxic, A & O x 3, very obese, looks well HEENT: Atraumatic, Normocephalic. Neck supple. No masses, No LAD.  Bilateral TM wnl, oropharynx normal.  PEERL,EOMI.   Nasal cavity congestion and sinus tenderness to percussion  Ears and Nose: No external deformity. CV: RRR, No M/G/R. No JVD. No thrill. No extra heart sounds. PULM: CTA B, no wheezes, crackles, rhonchi. No retractions. No resp.  distress. No accessory muscle use. EXTR: No c/c/e NEURO Normal gait.  PSYCH: Normally interactive. Conversant. Not depressed or anxious appearing.  Calm demeanor.  Right leg shows chronic erythema/ hyperpigmentation over the anterior shin.  It is not acutely red or warm, no weeping BLE with edema which she states is about at her baseine   Assessment and Plan: Acute frontal sinusitis, recurrence not specified - Plan: amoxicillin-clavulanate (AUGMENTIN) 875-125 MG per tablet  Cellulitis of right lower extremity  Cushing's syndrome  Recent surgery to remove pituitary tumor thought to be causing her Cushing's syndrome.  She is on a steroid taper, currently 5mg  of pred.  She is concerned about a possible sinus infection and also potentially recurring chronic right lower leg cellulitis.  Will cover both with augmentin 875 BID.   Noted her tachycardia on primary exam and discussed with her in detail.  She denies any unusual SOB.  Discussed possibility of PE which could be dangerous or even fatal if not treated.  However she has been known to have tachycardia for a long time.  She declines a D dimer or CT angiogram today but does agree to seek care if she has any other concerns about this issue   See patient instructions for more details.      Signed Lamar Blinks, MD

## 2013-12-23 ENCOUNTER — Ambulatory Visit (INDEPENDENT_AMBULATORY_CARE_PROVIDER_SITE_OTHER): Payer: BC Managed Care – PPO | Admitting: Internal Medicine

## 2013-12-23 VITALS — BP 136/72 | HR 105 | Temp 98.6°F | Resp 20 | Ht 65.0 in | Wt 281.0 lb

## 2013-12-23 DIAGNOSIS — R6883 Chills (without fever): Secondary | ICD-10-CM

## 2013-12-23 DIAGNOSIS — D899 Disorder involving the immune mechanism, unspecified: Secondary | ICD-10-CM

## 2013-12-23 DIAGNOSIS — J029 Acute pharyngitis, unspecified: Secondary | ICD-10-CM

## 2013-12-23 DIAGNOSIS — E1165 Type 2 diabetes mellitus with hyperglycemia: Secondary | ICD-10-CM

## 2013-12-23 DIAGNOSIS — R52 Pain, unspecified: Secondary | ICD-10-CM

## 2013-12-23 DIAGNOSIS — IMO0001 Reserved for inherently not codable concepts without codable children: Secondary | ICD-10-CM

## 2013-12-23 DIAGNOSIS — L03115 Cellulitis of right lower limb: Secondary | ICD-10-CM

## 2013-12-23 DIAGNOSIS — D849 Immunodeficiency, unspecified: Secondary | ICD-10-CM

## 2013-12-23 LAB — POCT CBC
Granulocyte percent: 76.9 %G (ref 37–80)
HCT, POC: 41.4 % (ref 37.7–47.9)
Hemoglobin: 13.7 g/dL (ref 12.2–16.2)
Lymph, poc: 1.9 (ref 0.6–3.4)
MCH, POC: 32 pg — AB (ref 27–31.2)
MCHC: 33.2 g/dL (ref 31.8–35.4)
MCV: 96.3 fL (ref 80–97)
MID (cbc): 0.7 (ref 0–0.9)
MPV: 8.2 fL (ref 0–99.8)
PLATELET COUNT, POC: 292 10*3/uL (ref 142–424)
POC Granulocyte: 8.5 — AB (ref 2–6.9)
POC LYMPH PERCENT: 17.1 %L (ref 10–50)
POC MID %: 6 %M (ref 0–12)
RBC: 4.3 M/uL (ref 4.04–5.48)
RDW, POC: 13.6 %
WBC: 11.1 10*3/uL — AB (ref 4.6–10.2)

## 2013-12-23 LAB — POCT RAPID STREP A (OFFICE): Rapid Strep A Screen: NEGATIVE

## 2013-12-23 MED ORDER — CEFTRIAXONE SODIUM 1 G IJ SOLR
1.0000 g | Freq: Once | INTRAMUSCULAR | Status: AC
  Administered 2013-12-23: 1 g via INTRAMUSCULAR

## 2013-12-23 NOTE — Patient Instructions (Signed)
Immunosuppression Immunosuppression is the use of medicine to lower your body's immune response. Your immune response is your body's natural reaction to defend against something new or unknown that enters your body, such as viruses and bacteria.  WHY AM I RECEIVING IMMUNOSUPPRESSION? You may be receiving immunosuppression to hold back your body's immune response for one of the following reasons:  As treatment for an autoimmune disorder. These disorders cause your body to use the immune response to attack itself.  To prevent your body from rejecting a transplant of cells, tissues, or organs that you have received from a donor. When you receive a transplant from a donor, your immune system knows that something is new and unknown to your body. Sometimes this triggers an immune response to attack the transplant.  To prevent inflammation caused by certain long-term (chronic) conditions such as asthma or chronic obstructive pulmonary disease (COPD). Often these types of chronic conditions are associated with severe immune responses. These responses can cause inflammation that can lead to life-threatening situations. Suppression of your body's immune system also affects your body's ability to defend itself and can lead to certain side effects. WHAT ARE THE SIDE EFFECTS OF IMMUNOSUPPRESSION? The major side effect of immunosuppression is an increased risk of infection. You should call your health care provider if you have the following signs or symptoms of an infection:  A fever.  Drainage or bad odor of drainage from your surgical scar if you had an organ transplant.  Burning when you pass your urine.  A cold or cough that will not go away.  Body aches. Other side effects typically go away as your body adjusts to the medicine. These side effects can include:  Nausea and vomiting.  Loss of appetite.  Increased hair growth.  Shaky hands (hand tremor). Adjusting medicine dosages or treating the  side effects will often decrease problems.  WHY IS IT IMPORTANT FOR ME TO TAKE MY MEDICINE EXACTLY AS INSTRUCTED? In order for immunosuppression to work, your body needs to have just the right level of medicine all of the time. For this to happen, your health care provider will tell you exactly how much and exactly when you need to take your medicine. It is very important to follow your health care provider's instructions. Even missing a single dose can cause your condition to worsen. If you forget a dose of medicine, take it as soon as you remember and call your health care provider right away. However, if you miss a dose and it is time to take your next dose, do not take a double dose.  WHAT CAN HELP ME DEVELOP A ROUTINE TO TAKE MY MEDICINE EXACTLY AS INSTRUCTED?  Use a tool such as a pill Environmental education officer, a written chart from your health care provider, a notebook, a binder, or your own calendar to organize your daily medicine(s). Your tool should help you keep track of the:  Name of the medicine and the dose.  Days to take the medicine(s).  Time of day to take the medicine(s).  Set cues or reminders for taking your medicine(s), such as setting an alarm on a clock or cell phone.  Review your medicine schedule with family members or friends so they can help you remember when to take your medicine(s) and how much to take. Document Released: 12/31/2012 Document Reviewed: 12/31/2012 [facility] Patient Information 2015 [place], [place]. This information is not intended to replace advice given to you by your health care provider. Make sure you discuss any questions  you have with your health care provider. Cellulitis Cellulitis is an infection of the skin and the tissue beneath it. The infected area is usually red and tender. Cellulitis occurs most often in the arms and lower legs.  CAUSES  Cellulitis is caused by bacteria that enter the skin through cracks or cuts in the skin. The most common types of  bacteria that cause cellulitis are staphylococci and streptococci. SIGNS AND SYMPTOMS   Redness and warmth.  Swelling.  Tenderness or pain.  Fever. DIAGNOSIS  Your health care provider can usually determine what is wrong based on a physical exam. Blood tests may also be done. TREATMENT  Treatment usually involves taking an antibiotic medicine. HOME CARE INSTRUCTIONS   Take your antibiotic medicine as directed by your health care provider. Finish the antibiotic even if you start to feel better.  Keep the infected arm or leg elevated to reduce swelling.  Apply a warm cloth to the affected area up to 4 times per day to relieve pain.  Take medicines only as directed by your health care provider.  Keep all follow-up visits as directed by your health care provider. SEEK MEDICAL CARE IF:   You notice red streaks coming from the infected area.  Your red area gets larger or turns dark in color.  Your bone or joint underneath the infected area becomes painful after the skin has healed.  Your infection returns in the same area or another area.  You notice a swollen bump in the infected area.  You develop new symptoms.  You have a fever. SEEK IMMEDIATE MEDICAL CARE IF:   You feel very sleepy.  You develop vomiting or diarrhea.  You have a general ill feeling (malaise) with muscle aches and pains. MAKE SURE YOU:   Understand these instructions.  Will watch your condition.  Will get help right away if you are not doing well or get worse. Document Released: 10/05/2004 Document Revised: 05/12/2013 Document Reviewed: 03/13/2011 [facility] Patient Information 2015 [place], [place]. This information is not intended to replace advice given to you by your health care provider. Make sure you discuss any questions you have with your health care provider.

## 2013-12-23 NOTE — Progress Notes (Signed)
   Subjective:    Patient ID: Kelli Allen, female    DOB: [birth year], 38 y.o.   MRN: [id number]  HPI Hx of recurrent cellulitis, T1DM, immunosuppression, recent brain surgery. Has chills body aches and rash that is painful. Regular patient of Dr. Tamala Julian. She is on augmentin po now.   Review of Systems     Objective:   Physical Exam  Constitutional: She is oriented to person, place, and time. She appears well-nourished. No distress.  HENT:  Head: Normocephalic.  Eyes: EOM are normal. Pupils are equal, round, and reactive to light. No scleral icterus.  Neck: Normal range of motion.  Cardiovascular: Normal rate.   Pulmonary/Chest: Effort normal.  Neurological: She is alert and oriented to person, place, and time. She exhibits normal muscle tone. Coordination normal.  Skin: Skin is warm and intact. Rash noted. Rash is maculopapular. Rash is not pustular. There is erythema.     X equals tender Red indurated tender  Psychiatric: She has a normal mood and affect. Her behavior is normal.      Results for orders placed or performed in visit on 12/23/13  POCT CBC  Result Value Ref Range   WBC 11.1 (A) 4.6 - 10.2 K/uL   Lymph, poc 1.9 0.6 - 3.4   POC LYMPH PERCENT 17.1 10 - 50 %L   MID (cbc) 0.7 0 - 0.9   POC MID % 6.0 0 - 12 %M   POC Granulocyte 8.5 (A) 2 - 6.9   Granulocyte percent 76.9 37 - 80 %G   RBC 4.30 4.04 - 5.48 M/uL   Hemoglobin 13.7 12.2 - 16.2 g/dL   HCT, POC 41.4 37.7 - 47.9 %   MCV 96.3 80 - 97 fL   MCH, POC 32.0 (A) 27 - 31.2 pg   MCHC 33.2 31.8 - 35.4 g/dL   RDW, POC 13.6 %   Platelet Count, POC 292 142 - 424 K/uL   MPV 8.2 0 - 99.8 fL  POCT rapid strep A  Result Value Ref Range   Rapid Strep A Screen Negative Negative       Assessment & Plan:  Chills/Cellulitis/Body aches/Immuno suppression Rocephin 1g/continue augmentin Reck 24 hrs

## 2013-12-24 ENCOUNTER — Ambulatory Visit (INDEPENDENT_AMBULATORY_CARE_PROVIDER_SITE_OTHER): Payer: BC Managed Care – PPO | Admitting: Family Medicine

## 2013-12-24 VITALS — BP 112/68 | HR 112 | Temp 98.9°F | Resp 20 | Ht 65.0 in | Wt 281.0 lb

## 2013-12-24 DIAGNOSIS — R197 Diarrhea, unspecified: Secondary | ICD-10-CM

## 2013-12-24 DIAGNOSIS — R110 Nausea: Secondary | ICD-10-CM

## 2013-12-24 DIAGNOSIS — L03115 Cellulitis of right lower limb: Secondary | ICD-10-CM

## 2013-12-24 DIAGNOSIS — E119 Type 2 diabetes mellitus without complications: Secondary | ICD-10-CM

## 2013-12-24 DIAGNOSIS — R21 Rash and other nonspecific skin eruption: Secondary | ICD-10-CM

## 2013-12-24 LAB — POCT URINALYSIS DIPSTICK
Bilirubin, UA: NEGATIVE
Blood, UA: NEGATIVE
Ketones, UA: 40
Leukocytes, UA: NEGATIVE
Nitrite, UA: NEGATIVE
Protein, UA: NEGATIVE
Spec Grav, UA: 1.015
Urobilinogen, UA: 0.2
pH, UA: 5

## 2013-12-24 LAB — POCT CBC
Granulocyte percent: 83.2 %G — AB (ref 37–80)
HCT, POC: 44.8 % (ref 37.7–47.9)
Hemoglobin: 14.8 g/dL (ref 12.2–16.2)
Lymph, poc: 1.9 (ref 0.6–3.4)
MCH, POC: 32 pg — AB (ref 27–31.2)
MCHC: 32.9 g/dL (ref 31.8–35.4)
MCV: 97.2 fL — AB (ref 80–97)
MID (cbc): 0.3 (ref 0–0.9)
MPV: 8.8 fL (ref 0–99.8)
POC Granulocyte: 11.1 — AB (ref 2–6.9)
POC LYMPH PERCENT: 14.3 %L (ref 10–50)
POC MID %: 2.5 %M (ref 0–12)
Platelet Count, POC: 311 10*3/uL (ref 142–424)
RBC: 4.61 M/uL (ref 4.04–5.48)
RDW, POC: 14 %
WBC: 13.3 10*3/uL — AB (ref 4.6–10.2)

## 2013-12-24 LAB — GLUCOSE, POCT (MANUAL RESULT ENTRY): POC Glucose: 308 mg/dl — AB (ref 70–99)

## 2013-12-24 LAB — POCT UA - MICROSCOPIC ONLY
Bacteria, U Microscopic: NEGATIVE
Casts, Ur, LPF, POC: NEGATIVE
Crystals, Ur, HPF, POC: NEGATIVE
Mucus, UA: NEGATIVE
Yeast, UA: NEGATIVE

## 2013-12-24 LAB — POCT SEDIMENTATION RATE: POCT SED RATE: 44 mm/hr — AB (ref 0–22)

## 2013-12-24 MED ORDER — DOXYCYCLINE HYCLATE 100 MG PO TABS: 100.0000 mg | ORAL_TABLET | Freq: Two times a day (BID) | ORAL | Status: DC

## 2013-12-24 NOTE — Progress Notes (Signed)
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  She has not had a flu shot yet  She aches all over, has a rash, notes nausea (no vomiting), and diarrhea.   Received a shot of Rocephin yesterday.  August:  Cellulitis right lower extremity November 18th pituitary adenoma removed, discharged on prednisone   Since November discharge:  Persistent diffuse itching  She was seen last Friday because of continued itching.  Her right leg had worsened as well.  She was started on Augmentin..  Objective:  NAD Diffuse dry and hyperemic arms, legs.  She also has palmar erythema and the right lower leg has circumferential violaceous discoloration.  Her face is plethoric. Chest:  Clear Heart:  Regular, without murmur  Results for orders placed or performed in visit on 12/24/13  POCT CBC  Result Value Ref Range   WBC 13.3 (A) 4.6 - 10.2 K/uL   Lymph, poc 1.9 0.6 - 3.4   POC LYMPH PERCENT 14.3 10 - 50 %L   MID (cbc) 0.3 0 - 0.9   POC MID % 2.5 0 - 12 %M   POC Granulocyte 11.1 (A) 2 - 6.9   Granulocyte percent 83.2 (A) 37 - 80 %G   RBC 4.61 4.04 - 5.48 M/uL   Hemoglobin 14.8 12.2 - 16.2 g/dL   HCT, POC 44.8 37.7 - 47.9 %   MCV 97.2 (A) 80 - 97 fL   MCH, POC 32.0 (A) 27 - 31.2 pg   MCHC 32.9 31.8 - 35.4 g/dL   RDW, POC 14.0 %   Platelet Count, POC 311 142 - 424 K/uL   MPV 8.8 0 - 99.8 fL  POCT SEDIMENTATION RATE  Result Value Ref Range   POCT SED RATE  0 - 22 mm/hr  POCT urinalysis dipstick  Result Value Ref Range   Color, UA yellow    Clarity, UA clear    Glucose, UA 500mg     Bilirubin, UA neg    Ketones, UA 40    Spec Grav, UA 1.015    Blood, UA neg    pH, UA 5.0    Protein, UA neg    Urobilinogen, UA 0.2    Nitrite, UA neg    Leukocytes, UA Negative   POCT UA - Microscopic Only  Result Value Ref Range   WBC, Ur, HPF, POC 2-6    RBC, urine, microscopic 0-2    Bacteria, U Microscopic neg    Mucus, UA neg    Epithelial cells, urine per micros 2-5    Crystals, Ur, HPF, POC neg     Casts, Ur, LPF, POC neg    Yeast, UA neg   POCT glucose (manual entry)  Result Value Ref Range   POC Glucose 308 (A) 70 - 99 mg/dl   This chart was scribed in my presence and reviewed by me personally.    ICD-9-CM ICD-10-CM   1. Rash and nonspecific skin eruption 782.1 R21 POCT CBC     POCT SEDIMENTATION RATE     POCT urinalysis dipstick     POCT UA - Microscopic Only     Comprehensive metabolic panel  2. Nausea without vomiting 787.02 R11.0 POCT CBC     POCT SEDIMENTATION RATE     POCT urinalysis dipstick     POCT UA - Microscopic Only     Comprehensive metabolic panel  3. Diarrhea 787.91 R19.7 POCT CBC     POCT SEDIMENTATION RATE     POCT urinalysis dipstick  POCT UA - Microscopic Only     Comprehensive metabolic panel  4. Cellulitis of right lower extremity 682.6 L03.115 POCT CBC     POCT SEDIMENTATION RATE     Comprehensive metabolic panel  5. Type 2 diabetes mellitus without complication 466.59 D35.7 POCT glucose (manual entry)     Signed, Robyn Haber, MD Rash and nonspecific skin eruption - Plan: POCT CBC, POCT SEDIMENTATION RATE, POCT urinalysis dipstick, POCT UA - Microscopic Only, Comprehensive metabolic panel  Nausea without vomiting - Plan: POCT CBC, POCT SEDIMENTATION RATE, POCT urinalysis dipstick, POCT UA - Microscopic Only, Comprehensive metabolic panel  Diarrhea - Plan: POCT CBC, POCT SEDIMENTATION RATE, POCT urinalysis dipstick, POCT UA - Microscopic Only, Comprehensive metabolic panel  Cellulitis of right lower extremity - Plan: POCT CBC, POCT SEDIMENTATION RATE, Comprehensive metabolic panel  Type 2 diabetes mellitus without complication - Plan: POCT glucose (manual entry)

## 2013-12-24 NOTE — Patient Instructions (Signed)
Labs suggest that the cellulitis is getting worse. At the same time your sugar is much too high at 308. This can make you feel pretty bad.  I would like you to do is stop taking Augmentin. Instead going to go with doxycycline. Like to have you come back in 2-3 days to recheck sugar and make sure that this is coming under control.

## 2013-12-25 ENCOUNTER — Other Ambulatory Visit: Payer: Self-pay | Admitting: Gynecologic Oncology

## 2013-12-25 ENCOUNTER — Ambulatory Visit (HOSPITAL_COMMUNITY)
Admission: RE | Admit: 2013-12-25 | Discharge: 2013-12-25 | Disposition: A | Discharge status: Home / Self Care | Payer: BC Managed Care – PPO | Source: Ambulatory Visit | Attending: Gynecologic Oncology | Admitting: Gynecologic Oncology

## 2013-12-25 DIAGNOSIS — Z1231 Encounter for screening mammogram for malignant neoplasm of breast: Secondary | ICD-10-CM

## 2013-12-25 DIAGNOSIS — C541 Malignant neoplasm of endometrium: Secondary | ICD-10-CM

## 2013-12-25 LAB — COMPREHENSIVE METABOLIC PANEL
ALT: 40 U/L — ABNORMAL HIGH (ref 0–35)
AST: 34 U/L (ref 0–37)
Albumin: 4.2 g/dL (ref 3.5–5.2)
Alkaline Phosphatase: 66 U/L (ref 39–117)
BUN: 9 mg/dL (ref 6–23)
CO2: 24 mEq/L (ref 19–32)
Calcium: 10.5 mg/dL (ref 8.4–10.5)
Chloride: 100 mEq/L (ref 96–112)
Creat: 0.59 mg/dL (ref 0.50–1.10)
Glucose, Bld: 307 mg/dL — ABNORMAL HIGH (ref 70–99)
Potassium: 4.4 mEq/L (ref 3.5–5.3)
Sodium: 137 mEq/L (ref 135–145)
Total Bilirubin: 0.4 mg/dL (ref 0.2–1.2)
Total Protein: 6.8 g/dL (ref 6.0–8.3)

## 2013-12-26 ENCOUNTER — Telehealth: Payer: Self-pay

## 2013-12-26 NOTE — Telephone Encounter (Signed)
Pt states she is concerned about lab results that were done previously. Pt wants to know results and what to do about infection if one is present. Please advise. Cb # [phone]

## 2013-12-29 ENCOUNTER — Telehealth: Payer: Self-pay | Admitting: *Deleted

## 2013-12-29 NOTE — Telephone Encounter (Signed)
Spoke to pt earlier this morning and she has been advised to follow up with Kelli Allen or Dr. Joseph Art this week per lab results.

## 2013-12-29 NOTE — Telephone Encounter (Signed)
Spoke with Jethro Bolus, LPN, who advised she had spoken with patient at 1247 advising her of her need to come into walk-in with Chelle or Lauenstein (in which she also provided their schedule).  Upon review of previous documentation, it was concluded that patient had been transferred & that is when my conversation with her began...when she had been transferred to scheduling.  I phoned patient back and re-advised her of MD's recommendation (per Sara's previous documentation) and she stated she would call back and speak with either myself or Jethro Bolus, LPN specifically.

## 2013-12-29 NOTE — Telephone Encounter (Addendum)
Patient phoned requesting appt for 1st available with PCP, first avail was 01/28/14 week...  Is concerned regarding WBC count, previous OV recommendation to f/u to this week.  Stated she had surgical f/u this morning, who drew labs for WBC count...  But prefers to see PCP due to established provider/patient relationship, but doesn't know what to do regarding coming in for f/u with blood sugars and WBC's...  States her BG's are coming down and is around 200...  States if it is still advised for her to f/u THIS WEEK, she will come in & see someone in walk-in/recommendation.  Patient contact # [phone]  Please advise.

## 2013-12-29 NOTE — Telephone Encounter (Signed)
Spoke to pt- she wanted to schedule an appt transferred to schedule this

## 2013-12-29 NOTE — Telephone Encounter (Signed)
Patient phoned in stating she is waiting to obtain personal information, but called to inquire about schedule for tomorrow for providers and will contact office tomorrow to see Copland or Lauenstein as needed tomorrow.

## 2013-12-29 NOTE — Telephone Encounter (Signed)
Telephone messages reviewed and noted.

## 2013-12-29 NOTE — Telephone Encounter (Signed)
Dr. Pauletta Browns result note asks her to come back in for a recheck.  She was advised to see me if he is not here.

## 2014-01-01 ENCOUNTER — Ambulatory Visit (INDEPENDENT_AMBULATORY_CARE_PROVIDER_SITE_OTHER): Payer: BC Managed Care – PPO | Admitting: Physician Assistant

## 2014-01-01 VITALS — BP 134/72 | HR 118 | Temp 98.2°F | Resp 18 | Ht 65.0 in | Wt 274.0 lb

## 2014-01-01 DIAGNOSIS — R000 Tachycardia, unspecified: Secondary | ICD-10-CM

## 2014-01-01 MED ORDER — LABETALOL HCL 200 MG PO TABS: 200.0000 mg | ORAL_TABLET | Freq: Two times a day (BID) | ORAL | Status: DC

## 2014-01-01 NOTE — Progress Notes (Signed)
IDENTIFYING INFORMATION  Kelli Allen / DOB: 07-May-1975 / MRN: [id number]  The patient has POLYCYSTIC OVARY; Obesity; ASTHMA, UNSPECIFIED; Cellulitis, leg; Diabetes mellitus type 2, uncontrolled, without complications; HTN (hypertension); Elevated cholesterol with high triglycerides; Endometrial cancer; Cushing's disease; and Sinus tachycardia by electrocardiogram on her problem list.  SUBJECTIVE  CC: rx refills and Follow-up   HPI: Kelli Allen is a 38 y.o. y.o. female presenting for a refill of her Labatalol. She has been off of the medication for 2 weeks now. She was first diagnosed with sinus tachy on October of 2012.  She reports this was diagnosed by a heart doctor at [facility]. She received an echo which was benign, and she has never received a stress or cath. She has changed doctors since and the medication fell off since.  She is a diabetic.  She is taking Lisinopril, atorvastatin, metformin along with insulin.  She does not take ASA.    Her diabetes is managed by an endocrinology PA in [place].  She has had a TSH 1.831 in November.    She has a recent history of pituitary tumor and Cushing's disease, with adenoectomy on November the 18th.  She is being wheened off of her Prednisone dose of 10 mg by her endocrinologist.     With regard to her cellulitis on her right leg she reports this is doing much better.  She denies systemic signs of infection and states that the affected area is without pain, throbbing, and the redness is improving.  She is currently taking doxycycline.   She  has a past medical history of Diabetes mellitus; Cellulitis of lower leg; Hypertension; Irregular menstrual bleeding; PCOS (polycystic ovarian syndrome); Tachycardia; Abnormal Pap smear (2000); Endometrial ca (12/20/2011); Asthma; Family history of anesthesia complication; and Cushing's syndrome (09/25/2013).    She has a current medication list which includes the following prescription(s):  atorvastatin, bayer contour test, doxycycline, vitamin d2, gabapentin, insulin aspart, insulin glargine, labetalol, liraglutide, lisinopril-hydrochlorothiazide, lorazepam, metformin, multivitamin, NON FORMULARY, omega-3, OVER THE COUNTER MEDICATION, OVER THE COUNTER MEDICATION, prednisone, proair hfa, probiotic product, promethazine, ranitidine, UNABLE TO FIND, fluconazole, insulin aspart, senna-docusate, and silver sulfadiazine.  Kelli Allen is allergic to antibacterial hand soap. She  reports that she has never smoked. She has never used smokeless tobacco. She reports that she drinks alcohol. She reports that she does not use illicit drugs. She  reports that she currently engages in sexual activity. She reports using the following method of birth control/protection: IUD.  The patient  has past surgical history that includes Hysteroscopy w/D&C (01/24/2012); Intrauterine device (iud) insertion (01/24/2012); Dilation and curettage of uterus; Robotic assisted total hysterectomy with bilateral salpingo oophorectomy (Bilateral, 07/02/2012); and Abdominal hysterectomy.  Her family history includes Coronary artery disease in her father and mother; Diabetes in her father, mother, and sister; Diabetes type II in her father and mother; Heart disease in her father, mother, and sister; Hyperlipidemia in her father and mother; Hypertension in her mother.  Review of Systems  Constitutional: Negative for fever, chills, weight loss, malaise/fatigue and diaphoresis.  Respiratory: Negative for shortness of breath.   Cardiovascular: Negative for chest pain and palpitations.  Skin: Negative for itching and rash.  Neurological: Negative for dizziness, weakness and headaches.    OBJECTIVE  Blood pressure 134/72, pulse 118, temperature 98.2 F (36.8 C), temperature source Oral, resp. rate 18, height 5\' 5"  (1.651 m), weight 274 lb (124.286 kg), last menstrual period 01/17/2012, SpO2 97 %. The patient's body mass index  is  45.6 kg/(m^2).  Physical Exam  Constitutional: She is oriented to person, place, and time. She appears well-developed and well-nourished. No distress.  HENT:  Mouth/Throat: No oropharyngeal exudate.  Cardiovascular: Normal rate and normal heart sounds.   Tachycardia present  Respiratory: Effort normal and breath sounds normal.  Neurological: She is alert and oriented to person, place, and time.  Skin: Skin is warm and dry. She is not diaphoretic.       No results found for this or any previous visit (from the past 24 hour(s)).  ASSESSMENT & PLAN  Kelli Allen was seen today for rx refills and follow-up.  Diagnoses and associated orders for this visit:  Sinus tachycardia:   - labetalol (NORMODYNE) 200 MG tablet; Take 1 tablet (200 mg total) by mouth 2 (two) times daily. -     Patient instructed to f/u with Dr. Tamala Julian in January.      The patient was instructed to to call or comeback to clinic as needed, or should symptoms warrant.  Philis Fendt, MHS, PA-C [facility] 01/01/2014 1:38 PM

## 2014-01-08 ENCOUNTER — Ambulatory Visit (INDEPENDENT_AMBULATORY_CARE_PROVIDER_SITE_OTHER): Payer: BC Managed Care – PPO | Admitting: Family Medicine

## 2014-01-08 VITALS — BP 124/82 | HR 107 | Temp 97.6°F | Resp 20 | Ht 65.0 in | Wt 278.4 lb

## 2014-01-08 DIAGNOSIS — L301 Dyshidrosis [pompholyx]: Secondary | ICD-10-CM

## 2014-01-08 DIAGNOSIS — E1142 Type 2 diabetes mellitus with diabetic polyneuropathy: Secondary | ICD-10-CM

## 2014-01-08 DIAGNOSIS — Z23 Encounter for immunization: Secondary | ICD-10-CM

## 2014-01-08 DIAGNOSIS — E240 Pituitary-dependent Cushing's disease: Secondary | ICD-10-CM

## 2014-01-08 DIAGNOSIS — F4323 Adjustment disorder with mixed anxiety and depressed mood: Secondary | ICD-10-CM

## 2014-01-08 DIAGNOSIS — G2581 Restless legs syndrome: Secondary | ICD-10-CM

## 2014-01-08 DIAGNOSIS — L03115 Cellulitis of right lower limb: Secondary | ICD-10-CM

## 2014-01-08 MED ORDER — LORAZEPAM 0.5 MG PO TABS: 0.5000 mg | ORAL_TABLET | Freq: Two times a day (BID) | ORAL | Status: DC | PRN

## 2014-01-08 MED ORDER — ROPINIROLE HCL 0.25 MG PO TABS: 0.2500 mg | ORAL_TABLET | Freq: Every day | ORAL | Status: DC

## 2014-01-08 MED ORDER — FLUOXETINE HCL 20 MG PO TABS: 20.0000 mg | ORAL_TABLET | Freq: Every day | ORAL | Status: DC

## 2014-01-08 MED ORDER — HYDROCORTISONE 2.5 % EX OINT: TOPICAL_OINTMENT | Freq: Two times a day (BID) | CUTANEOUS | Status: DC

## 2014-01-08 NOTE — Progress Notes (Signed)
Subjective:  This chart was scribed for Wardell Honour, MD by Ladene Artist, ED Scribe. The patient was seen in room 12. Patient's care was started at 11:20 AM.    Patient ID: Kelli Allen, female    DOB: Dec 08, 1975, 38 y.o.   MRN: [id number]  01/08/2014  Follow-up  HPI HPI Comments: Kelli Allen is a 38 y.o. female, with a h/o DM, HTN, CA, Cushing's disease, asthma, polycystic ovarian syndrome, who presents to the [facility] for a follow-up appointment regarding RLE cellulitis that is improving. Pt was last seen on 01/01/14. She was seen on 12/24/13 by Dr. Joseph Art for worsening redness and itching to R lower extremity. Pt was started on Doxycycline for recurrent lower extremity cellulitis. Pt has h/o sepsis with lower extremity cellulitis.  Denies fever/chills/sweats.  No further redness present.  Minimal to no pain at this time in R leg.  Pt states that she developed another infection/sinusitis following surgery and was given a shot of Rocephin. Pt was then seen by Dr. Joseph Art and has finished antibiotics. Pt reports cortisol of .9 upon discharge following surgery and 1.7 on Prednisone on 12/29/13. She is still out of work until Monday, 01/12/13. She also states that she is on Prednisone 10 until Monday when she reduces to 5mg .  She is very hesitant to decrease Prednisone to 5mg  on the first day and week back to work.     DM Pt states that her blood glucose levels have improved with an increase in Prednisone. She also reports an increase in blood glucose while on Doxycycline. Fasting blood glucose this morning was 183. Pt has a scheduled appointment with her Endocrinologist in January 2016.  She is treating elevated readings with SSI.    Restless Leg Pt reports restless legs that are worse while sleeping at night. She reports associated leg cramping that she describes as an aching sensation. She states "my legs and feet jump all night". Pt is currently taking 900  mg Gabapentin at bedtime for neuropathy which has gradually increased over the past 3 years. Pt had a sleep study done in 2014 and was diagnosed with sleep apnea; physician asked pt about leg movements at night during that consultation and recommended an increase of Gabapentin to 900mg  at that time but pt does not recall being diagnosed with RLS from sleep study.   Anxiety  Pt was prescribed 30 tablets of Ativan by her specialist due to increased anxiety of returning to work and with her step-children coming to visit for the holidays. Pt states "my brain goes nonstop, I'm like the Energizer bunny", worse around 2 PM. She reports that this is baseline for her but she has noticed an increase in anxiety since surgery and since admission to ICU for sepsis from RLE cellulitis. Pt has not tried anti-depressants in the past.  Reports frequent emotional stress.  Rash Pt reports gradually improving generalized rash that she describes as itchy. Pt states that rash is worse on bilateral palms. She attributes rash to antibacterial hand soap or Prednisone. Pt states that she carries her own soap to prevent rash. She has tried [employer] with mild relief.   Sinusitis  Pt reports that all sinus related symptoms have resolved.   Immunizations  Pt has not had a pneumonia or flu vaccine yet. She reports sick contacts with flu diagnosis. Pt plans to have immunizations obtained today.  PCP: Reginia Forts, MD  Review of Systems  Constitutional: Negative for fever, chills, diaphoresis and  fatigue.  HENT: Negative for congestion, postnasal drip, rhinorrhea and sinus pressure.   Eyes: Negative for visual disturbance.  Respiratory: Negative for cough and shortness of breath.   Cardiovascular: Negative for chest pain, palpitations and leg swelling.  Gastrointestinal: Negative for nausea, vomiting, abdominal pain, diarrhea and constipation.  Endocrine: Negative for cold intolerance, heat intolerance, polydipsia,  polyphagia and polyuria.  Musculoskeletal: Positive for myalgias.  Skin: Positive for rash. Negative for color change and wound.  Neurological: Positive for numbness. Negative for dizziness, tremors, seizures, syncope, facial asymmetry, speech difficulty, weakness, light-headedness and headaches.  Psychiatric/Behavioral: Positive for sleep disturbance. Negative for suicidal ideas, self-injury and dysphoric mood. The patient is nervous/anxious.    Past Medical History  Diagnosis Date  . Diabetes mellitus   . Cellulitis of lower leg     x3 on right leg  . Hypertension   . Irregular menstrual bleeding   . PCOS (polycystic ovarian syndrome)   . Tachycardia   . Abnormal Pap smear 2000  . Endometrial ca 12/20/2011  . Asthma     no issues in years  . Family history of anesthesia complication     sister had hard time waking up and problems breathing  . Cushing's syndrome 09/25/2013    MRI done on 10/02/2013   Past Surgical History  Procedure Laterality Date  . Hysteroscopy w/d&c  01/24/2012    Procedure: DILATATION AND CURETTAGE /HYSTEROSCOPY;  Surgeon: Betsy Coder, MD;  Location: [facility];  Service: Gynecology;  Laterality: N/A;  with vulvar biopsies   . Intrauterine device (iud) insertion  01/24/2012    Procedure: INTRAUTERINE DEVICE (IUD) INSERTION;  Surgeon: Betsy Coder, MD;  Location: [facility];  Service: Gynecology;  Laterality: N/A;  . Dilation and curettage of uterus    . Robotic assisted total hysterectomy with bilateral salpingo oopherectomy Bilateral 07/02/2012    Procedure: ROBOTIC ASSISTED TOTAL HYSTERECTOMY WITH BILATERAL SALPINGO OOPHORECTOMY ;  Surgeon: Imagene Gurney A. Alycia Rossetti, MD;  Location: [facility];  Service: Gynecology;  Laterality: Bilateral;  . Abdominal hysterectomy     Allergies  Allergen Reactions  . Antibacterial Hand Soap [Triclosan] Other (See Comments) and Rash    Dry, itchy patches on skin   Current Outpatient Prescriptions  Medication Sig Dispense Refill  .  atorvastatin (LIPITOR) 10 MG tablet Take 1 tablet (10 mg total) by mouth daily at 6 PM. 90 tablet 3  . BAYER CONTOUR TEST test strip   98  . doxycycline (VIBRA-TABS) 100 MG tablet Take 1 tablet (100 mg total) by mouth 2 (two) times daily. 20 tablet 0  . Ergocalciferol (VITAMIN D2) 2000 UNITS TABS Take 1 tablet by mouth.    . fluconazole (DIFLUCAN) 150 MG tablet Take 1 tablet (150 mg total) by mouth once. 2 tablet 0  . gabapentin (NEURONTIN) 300 MG capsule Take 900 mg by mouth at bedtime.    . insulin aspart (NOVOLOG FLEXPEN) 100 UNIT/ML FlexPen Inject 15 units with a correction 1:50 > 150 three times daily before meals    . insulin aspart (NOVOLOG) 100 UNIT/ML injection Inject 15 Units into the skin 3 (three) times daily with meals. Sliding scale    . Insulin Glargine (LANTUS SOLOSTAR) 100 UNIT/ML Solostar Pen Inject 70 Units into the skin.    Marland Kitchen labetalol (NORMODYNE) 200 MG tablet Take 1 tablet (200 mg total) by mouth 2 (two) times daily. 60 tablet 3  . Liraglutide (VICTOZA) 18 MG/3ML SOPN Inject 1.8 mg into the skin.    Marland Kitchen lisinopril-hydrochlorothiazide (PRINZIDE,ZESTORETIC) 20-12.5  MG per tablet Take 1 tablet by mouth every morning.     . metformin (FORTAMET) 500 MG (OSM) 24 hr tablet Take 1,000 mg by mouth 2 (two) times daily with a meal.     . Multiple Vitamin (MULTIVITAMIN) capsule Take 1 capsule by mouth.    . NON FORMULARY Take 1 capsule by mouth 2 (two) times daily. nutralite omega 3    . Omega-3 1000 MG CAPS Take 3 g by mouth.    Marland Kitchen OVER THE COUNTER MEDICATION Take 1 packet by mouth 2 (two) times daily. Nutralite multi  Vitamins    . OVER THE COUNTER MEDICATION Take 1 tablet by mouth 2 (two) times daily. Glucose Health Cromium Piccolate    . predniSONE (DELTASONE) 10 MG tablet 10 mg.   1  . PROAIR HFA 108 (90 BASE) MCG/ACT inhaler Inhale 2 puffs into the lungs every 4 (four) hours as needed for shortness of breath.     . Probiotic Product (PROBIOTIC DAILY PO) Take by mouth.    .  promethazine (PHENERGAN) 25 MG tablet Take 25 mg by mouth every 6 (six) hours as needed for nausea or vomiting.    . ranitidine (ZANTAC) 150 MG tablet Take 150 mg by mouth.    . senna-docusate (SENOKOT-S) 8.6-50 MG per tablet Take 2 tablets by mouth.    . silver sulfADIAZINE (SILVADENE) 1 % cream Apply 1 application topically daily. Apply dime size thickness to affected area daily 50 g 0  . UNABLE TO FIND Liver health. 1 tablet by mouth twice daily.    Marland Kitchen FLUoxetine (PROZAC) 20 MG tablet Take 1 tablet (20 mg total) by mouth daily. 30 tablet 5  . hydrocortisone 2.5 % ointment Apply topically 2 (two) times daily. 30 g 1  . LORazepam (ATIVAN) 0.5 MG tablet Take 1 tablet (0.5 mg total) by mouth 2 (two) times daily as needed for anxiety. 45 tablet 2  . rOPINIRole (REQUIP) 0.25 MG tablet Take 1 tablet (0.25 mg total) by mouth at bedtime. 30 tablet 2   No current facility-administered medications for this visit.      Objective:    BP 124/82 mmHg  Pulse 107  Temp(Src) 97.6 F (36.4 C) (Oral)  Resp 20  Ht 5\' 5"  (1.651 m)  Wt 278 lb 6.4 oz (126.281 kg)  BMI 46.33 kg/m2  SpO2 97%  LMP 01/17/2012 Physical Exam  Constitutional: She is oriented to person, place, and time. She appears well-developed and well-nourished. No distress.  Morbidly obese.  HENT:  Head: Normocephalic and atraumatic.  Right Ear: External ear normal.  Left Ear: External ear normal.  Nose: Nose normal.  Mouth/Throat: Oropharynx is clear and moist.  Eyes: Conjunctivae and EOM are normal. Pupils are equal, round, and reactive to light.  Neck: Normal range of motion. Neck supple. No thyromegaly present.  Cardiovascular: Normal rate, regular rhythm and normal heart sounds.  Exam reveals no gallop and no friction rub.   No murmur heard. Pulmonary/Chest: Effort normal and breath sounds normal. She has no wheezes. She has no rales.  Musculoskeletal: Normal range of motion. She exhibits edema.  2+ pitting edema to R lower  extremity to knee.   Lymphadenopathy:    She has no cervical adenopathy.  Neurological: She is alert and oriented to person, place, and time. No cranial nerve deficit. She exhibits normal muscle tone. Coordination normal.  Skin: Skin is warm and dry. Rash noted. She is not diaphoretic.  Scaling rash in interdigit spaces of bilateral hands.  No erythema or warmth. Area of hyperpigmentation 10 cm x 5 cm that is nontender to palpation.   Psychiatric: Her behavior is normal. Her mood appears anxious.  Nursing note and vitals reviewed.      Assessment & Plan:   1. Need for prophylactic vaccination and inoculation against influenza   2. Adjustment disorder with mixed anxiety and depressed mood   3. Restless leg syndrome   4. Dyshidrotic eczema   5. Cushing's disease   6. Type 2 diabetes mellitus with diabetic polyneuropathy   7. Cellulitis of right leg   8. Need for prophylactic vaccination against Streptococcus pneumoniae (pneumococcus)     1. Cushing's disease:  New.  S/p surgical removal of adenoma.  Returns to work next week; doing well in post-operative period. Maintained on 5-10mg  of Prednisone daily; agree to remain on 10mg  daily of prednisone for first week of work. Follow-up with endocrinology and NS. 2. DMII: uncontrolled; managed by endocrinology; maintained on SSI. 3.  Anxiety: New. Rx for Prozac 20mg  daily provided; rx for Ativan 0.5mg  bid provided; recommend taking one tablet scheduled at 2pm daily; can take another if needed.  Will likely need increase in Prozac dose at follow-up. 4.  RLS: New.  Rx for Requip 0.25mg  qhs provided; will likely need increased dose at follow-up. 5. Dyshidrotic eczema: New. B hands. Rx for hydrocortisone 2.5% bid. 6.  RLE cellulitis: resolved.  Doing well.  S/p Doxycycline. 7.  S/p flu vaccine and Pneumovax.    Meds ordered this encounter  Medications  . FLUoxetine (PROZAC) 20 MG tablet    Sig: Take 1 tablet (20 mg total) by mouth daily.     Dispense:  30 tablet    Refill:  5  . LORazepam (ATIVAN) 0.5 MG tablet    Sig: Take 1 tablet (0.5 mg total) by mouth 2 (two) times daily as needed for anxiety.    Dispense:  45 tablet    Refill:  2  . rOPINIRole (REQUIP) 0.25 MG tablet    Sig: Take 1 tablet (0.25 mg total) by mouth at bedtime.    Dispense:  30 tablet    Refill:  2  . hydrocortisone 2.5 % ointment    Sig: Apply topically 2 (two) times daily.    Dispense:  30 g    Refill:  1    Return in about 4 weeks (around 02/05/2014) for recheck.    I personally performed the services described in this documentation, which was scribed in my presence. The recorded information has been reviewed and considered.  Harshith Pursell Elayne Guerin, M.D. [facility] [address] [place], [place]  [address] [phone] phone [phone] fax

## 2014-01-13 DIAGNOSIS — E1142 Type 2 diabetes mellitus with diabetic polyneuropathy: Secondary | ICD-10-CM | POA: Insufficient documentation

## 2014-02-11 ENCOUNTER — Encounter: Payer: Self-pay | Admitting: Family Medicine

## 2014-02-11 ENCOUNTER — Ambulatory Visit (INDEPENDENT_AMBULATORY_CARE_PROVIDER_SITE_OTHER): Payer: BLUE CROSS/BLUE SHIELD | Admitting: Family Medicine

## 2014-02-11 VITALS — BP 118/78 | HR 108 | Temp 98.1°F | Resp 16 | Ht 62.25 in | Wt 275.0 lb

## 2014-02-11 DIAGNOSIS — E785 Hyperlipidemia, unspecified: Secondary | ICD-10-CM

## 2014-02-11 DIAGNOSIS — IMO0001 Reserved for inherently not codable concepts without codable children: Secondary | ICD-10-CM

## 2014-02-11 DIAGNOSIS — F411 Generalized anxiety disorder: Secondary | ICD-10-CM

## 2014-02-11 DIAGNOSIS — M2550 Pain in unspecified joint: Secondary | ICD-10-CM

## 2014-02-11 DIAGNOSIS — G2581 Restless legs syndrome: Secondary | ICD-10-CM

## 2014-02-11 DIAGNOSIS — M791 Myalgia: Secondary | ICD-10-CM

## 2014-02-11 DIAGNOSIS — M609 Myositis, unspecified: Secondary | ICD-10-CM

## 2014-02-11 DIAGNOSIS — E240 Pituitary-dependent Cushing's disease: Secondary | ICD-10-CM

## 2014-02-11 DIAGNOSIS — E1165 Type 2 diabetes mellitus with hyperglycemia: Secondary | ICD-10-CM

## 2014-02-11 LAB — POCT URINALYSIS DIPSTICK
BILIRUBIN UA: NEGATIVE
Blood, UA: NEGATIVE
GLUCOSE UA: NEGATIVE
KETONES UA: NEGATIVE
LEUKOCYTES UA: NEGATIVE
Nitrite, UA: NEGATIVE
PROTEIN UA: NEGATIVE
Spec Grav, UA: 1.015
Urobilinogen, UA: 0.2
pH, UA: 6

## 2014-02-11 LAB — COMPREHENSIVE METABOLIC PANEL
ALBUMIN: 4.1 g/dL (ref 3.5–5.2)
ALT: 27 U/L (ref 0–35)
AST: 22 U/L (ref 0–37)
Alkaline Phosphatase: 60 U/L (ref 39–117)
BUN: 13 mg/dL (ref 6–23)
CO2: 26 mEq/L (ref 19–32)
Calcium: 10.3 mg/dL (ref 8.4–10.5)
Chloride: 97 mEq/L (ref 96–112)
Creat: 0.76 mg/dL (ref 0.50–1.10)
GLUCOSE: 205 mg/dL — AB (ref 70–99)
POTASSIUM: 4.7 meq/L (ref 3.5–5.3)
Sodium: 140 mEq/L (ref 135–145)
Total Bilirubin: 0.4 mg/dL (ref 0.2–1.2)
Total Protein: 6.7 g/dL (ref 6.0–8.3)

## 2014-02-11 LAB — CBC WITH DIFFERENTIAL/PLATELET
BASOS ABS: 0.1 10*3/uL (ref 0.0–0.1)
Basophils Relative: 1 % (ref 0–1)
EOS PCT: 10 % — AB (ref 0–5)
Eosinophils Absolute: 1 10*3/uL — ABNORMAL HIGH (ref 0.0–0.7)
HCT: 38 % (ref 36.0–46.0)
Hemoglobin: 12.6 g/dL (ref 12.0–15.0)
LYMPHS PCT: 27 % (ref 12–46)
Lymphs Abs: 2.8 10*3/uL (ref 0.7–4.0)
MCH: 31.7 pg (ref 26.0–34.0)
MCHC: 33.2 g/dL (ref 30.0–36.0)
MCV: 95.7 fL (ref 78.0–100.0)
MONO ABS: 0.8 10*3/uL (ref 0.1–1.0)
MONOS PCT: 8 % (ref 3–12)
MPV: 11.2 fL (ref 8.6–12.4)
Neutro Abs: 5.6 10*3/uL (ref 1.7–7.7)
Neutrophils Relative %: 54 % (ref 43–77)
Platelets: 380 10*3/uL (ref 150–400)
RBC: 3.97 MIL/uL (ref 3.87–5.11)
RDW: 12.7 % (ref 11.5–15.5)
WBC: 10.4 10*3/uL (ref 4.0–10.5)

## 2014-02-11 LAB — CK: Total CK: 53 U/L (ref 7–177)

## 2014-02-11 MED ORDER — TRAMADOL HCL 50 MG PO TABS: 50.0000 mg | ORAL_TABLET | Freq: Four times a day (QID) | ORAL | Status: DC | PRN

## 2014-02-11 NOTE — Progress Notes (Signed)
Subjective:    Patient ID: Karl Pock, female    DOB: [birth year], 39 y.o.   MRN: [id number]  02/11/2014  Follow-up   HPI This 39 y.o. female presents for one month follow-up:  1.  Cushings disease:Transsphenoidal hypophysectomy for ACTH secreting pituitary lesion by Dr. Randell Loop on 11/26/2013 at [facility].  Cortisol levels are still low; trying to wean Prednisone to $RemoveBefor'6mg'RpSxhUtkLbzo$  daily.  Feeling tired.  Lowered prednisone dose one week ago.  Did not take Prednisone today.  Weight down 12 pounds from 12/2013.  Nausea has improved; decreased appetite.  Eats because needs to eat.  Will get on a kick and eat something for days.  Follow up in three weeks to repeat cortisol levels.    2.  RLS: Requip working well.  No longer moving during the night.  3. Myalgias and cramps: legs are achy and cramping.  Juliann Pulse thought might be interaction with Atorvastatin.  Not supposed to take Ibuprofen; Tylenol does not touch it.  No joint pain or swelling.  B knees are aching R>L.  Lives on third floor; taking steps one of a time.  No swelling.  R lateral knee soreness.  Normal walking upstairs is difficult.  Onset two weeks ago.  Discussed with chiropractor two weeks ago; he manipulated them with worsening pain. No follow-up with chiropractor.  No falls or overexertion of knees.  No exercise or working out.    3.  DMII: in past two days, sugars elevated above 200.  Had been improving in morning and nights.  Now over 200 and 300.  Do not feel good.  No signs of infection.    4. Anxiety:  Taking Prozac $RemoveBefor'20mg'ZbAzhtZSeDCy$  daily and Ativan at 2pm.  Thoughts have decreased some; not as racing.  More frustrated because recovery has been slow.  House is driving nuts; Lucianne Lei is driving nuts.   Still having a roller coaster regarding overall feeling.     Review of Systems  Constitutional: Positive for chills, appetite change, fatigue and unexpected weight change. Negative for fever and diaphoresis.  HENT: Negative for congestion, ear  pain, rhinorrhea, sinus pressure and sore throat.   Eyes: Negative for visual disturbance.  Respiratory: Negative for cough and shortness of breath.   Cardiovascular: Positive for leg swelling. Negative for chest pain and palpitations.  Gastrointestinal: Positive for nausea. Negative for vomiting, abdominal pain, diarrhea and constipation.  Endocrine: Negative for cold intolerance, heat intolerance, polydipsia, polyphagia and polyuria.  Musculoskeletal: Positive for myalgias, arthralgias, neck pain and neck stiffness.  Skin: Negative for color change, pallor, rash and wound.  Neurological: Negative for dizziness, tremors, seizures, syncope, facial asymmetry, speech difficulty, weakness, light-headedness, numbness and headaches.  Psychiatric/Behavioral: Negative for suicidal ideas, sleep disturbance, self-injury and dysphoric mood. The patient is nervous/anxious.     Past Medical History  Diagnosis Date  . Diabetes mellitus   . Cellulitis of lower leg     x3 on right leg  . Hypertension   . Irregular menstrual bleeding   . PCOS (polycystic ovarian syndrome)   . Tachycardia   . Abnormal Pap smear 2000  . Endometrial ca 12/20/2011  . Asthma     no issues in years  . Family history of anesthesia complication     sister had hard time waking up and problems breathing  . Cushing's syndrome 09/25/2013    MRI done on 10/02/2013  . Cushing syndrome 11/26/2013    s/p transsphenoidal hypophysectomy for ACTH secreting pituitary lesion   Past Surgical  History  Procedure Laterality Date  . Hysteroscopy w/d&c  01/24/2012    Procedure: DILATATION AND CURETTAGE /HYSTEROSCOPY;  Surgeon: Michael Litter, MD;  Location: [facility];  Service: Gynecology;  Laterality: N/A;  with vulvar biopsies   . Intrauterine device (iud) insertion  01/24/2012    Procedure: INTRAUTERINE DEVICE (IUD) INSERTION;  Surgeon: Michael Litter, MD;  Location: [facility];  Service: Gynecology;  Laterality: N/A;  . Dilation and  curettage of uterus    . Robotic assisted total hysterectomy with bilateral salpingo oopherectomy Bilateral 07/02/2012    Procedure: ROBOTIC ASSISTED TOTAL HYSTERECTOMY WITH BILATERAL SALPINGO OOPHORECTOMY ;  Surgeon: Rejeana Brock A. Duard Brady, MD;  Location: [facility];  Service: Gynecology;  Laterality: Bilateral;  . Abdominal hysterectomy    . Transsphenoid hypophysectomy  11/26/2013    ACTH secreting pituitary lesion causing Cushing's; [facility]; Brooke Pace, MD   Allergies  Allergen Reactions  . Antibacterial Hand Soap [Triclosan] Other (See Comments) and Rash    Dry, itchy patches on skin   Current Outpatient Prescriptions  Medication Sig Dispense Refill  . atorvastatin (LIPITOR) 10 MG tablet Take 1 tablet (10 mg total) by mouth daily at 6 PM. 90 tablet 3  . BAYER CONTOUR TEST test strip   98  . Ergocalciferol (VITAMIN D2) 2000 UNITS TABS Take 1 tablet by mouth.    Marland Kitchen FLUoxetine (PROZAC) 20 MG tablet Take 1 tablet (20 mg total) by mouth daily. 30 tablet 5  . gabapentin (NEURONTIN) 300 MG capsule Take 900 mg by mouth at bedtime.    . hydrocortisone 2.5 % ointment Apply topically 2 (two) times daily. 30 g 1  . insulin aspart (NOVOLOG FLEXPEN) 100 UNIT/ML FlexPen Inject 15 units with a correction 1:50 > 150 three times daily before meals    . Insulin Glargine (LANTUS SOLOSTAR) 100 UNIT/ML Solostar Pen Inject 70 Units into the skin.    Marland Kitchen labetalol (NORMODYNE) 200 MG tablet Take 1 tablet (200 mg total) by mouth 2 (two) times daily. 60 tablet 3  . Liraglutide (VICTOZA) 18 MG/3ML SOPN Inject 1.8 mg into the skin.    Marland Kitchen lisinopril-hydrochlorothiazide (PRINZIDE,ZESTORETIC) 20-12.5 MG per tablet Take 1 tablet by mouth every morning.     Marland Kitchen LORazepam (ATIVAN) 0.5 MG tablet Take 1 tablet (0.5 mg total) by mouth 2 (two) times daily as needed for anxiety. 45 tablet 2  . metformin (FORTAMET) 500 MG (OSM) 24 hr tablet Take 1,000 mg by mouth 2 (two) times daily with a meal.     . Multiple Vitamin (MULTIVITAMIN)  capsule Take 1 capsule by mouth.    . NON FORMULARY Take 1 capsule by mouth 2 (two) times daily. nutralite omega 3    . Omega-3 1000 MG CAPS Take 3 g by mouth.    Marland Kitchen OVER THE COUNTER MEDICATION Take 1 packet by mouth 2 (two) times daily. Nutralite multi  Vitamins    . OVER THE COUNTER MEDICATION Take 1 tablet by mouth 2 (two) times daily. Glucose Health Cromium Piccolate    . predniSONE (DELTASONE) 10 MG tablet 10 mg.   1  . PROAIR HFA 108 (90 BASE) MCG/ACT inhaler Inhale 2 puffs into the lungs every 4 (four) hours as needed for shortness of breath.     . Probiotic Product (PROBIOTIC DAILY PO) Take by mouth.    . promethazine (PHENERGAN) 25 MG tablet Take 25 mg by mouth every 6 (six) hours as needed for nausea or vomiting.    . ranitidine (ZANTAC) 150 MG tablet  Take 150 mg by mouth.    Marland Kitchen rOPINIRole (REQUIP) 0.25 MG tablet Take 1 tablet (0.25 mg total) by mouth at bedtime. 30 tablet 2  . UNABLE TO FIND Liver health. 1 tablet by mouth twice daily.    . insulin aspart (NOVOLOG) 100 UNIT/ML injection Inject 15 Units into the skin 3 (three) times daily with meals. Sliding scale    . senna-docusate (SENOKOT-S) 8.6-50 MG per tablet Take 2 tablets by mouth.    . silver sulfADIAZINE (SILVADENE) 1 % cream Apply 1 application topically daily. Apply dime size thickness to affected area daily (Patient not taking: Reported on 02/11/2014) 50 g 0  . traMADol (ULTRAM) 50 MG tablet Take 1 tablet (50 mg total) by mouth every 6 (six) hours as needed. 60 tablet 2   No current facility-administered medications for this visit.       Objective:    BP 118/78 mmHg  Pulse 108  Temp(Src) 98.1 F (36.7 C) (Oral)  Resp 16  Ht 5' 2.25" (1.581 m)  Wt 275 lb (124.739 kg)  BMI 49.90 kg/m2  SpO2 96%  LMP 01/17/2012 Physical Exam  Constitutional: She is oriented to person, place, and time. She appears well-developed and well-nourished. No distress.  Obese; moon facies.  HENT:  Head: Normocephalic and atraumatic.    Right Ear: External ear normal.  Left Ear: External ear normal.  Nose: Nose normal.  Mouth/Throat: Oropharynx is clear and moist.  Eyes: Conjunctivae and EOM are normal. Pupils are equal, round, and reactive to light.  Neck: Normal range of motion. Neck supple. Carotid bruit is not present. No thyromegaly present.  Cardiovascular: Normal rate, regular rhythm, normal heart sounds and intact distal pulses.  Exam reveals no gallop and no friction rub.   No murmur heard. 1+ pitting and non-pitting edema RLE>LLE  Pulmonary/Chest: Effort normal and breath sounds normal. She has no wheezes. She has no rales.  Abdominal: Soft. Bowel sounds are normal. She exhibits no distension and no mass. There is no tenderness. There is no rebound and no guarding.  Musculoskeletal: She exhibits edema.  Lymphadenopathy:    She has no cervical adenopathy.  Neurological: She is alert and oriented to person, place, and time. No cranial nerve deficit.  Skin: Skin is warm and dry. No rash noted. She is not diaphoretic. No erythema. No pallor.  +hyperpigmentation R anterior shin improved from last visit at site of previous cellulitis.  Psychiatric: She has a normal mood and affect. Her behavior is normal.        Assessment & Plan:   1. Myalgia and myositis   2. Arthralgia   3. Generalized anxiety disorder   4. RLS (restless legs syndrome)   5. Hyperlipidemia   6. Cushing's disease   7. Diabetes mellitus type 2, uncontrolled, without complications     1. Myalgias and arthralgias: New. Obtain labs including CK, ESR.  Agreeable to have patient HOLD statin for next three months.  Feel that current symptoms due to weaning Prednisone dose and Cushing's syndrome s/p recent surgical resection of ACTH secreting pituitary tumor.  Supportive care with rest, fluids. Rx for Tramadol provided for myalgias and arthralgias.  Close follow-up with PCP, endocrinology, NS. 2.  Generalized anxiety disorder: improved with Prozac  $Remov'20mg'ZYTGXM$  daily and Klonopin every afternoon.  Pt does not desire increase in Prozac dose at this time; persistent anxiety due to frustration with slow progress after surgery. 3.  RLS: improved with Requip.  No changes to management. 4.  Hyperlipidemia: stable;  agreeable to HOLDING statin for three months. 5.  Cushings disease: stable; managed by NS and endocrinology; attempting to wean Prednisone at this time yet adrenal insufficiency present and slowing wean of Prednisone.  Pt has lost ten pounds in past three months. 6.  DMII: uncontrolled; managed by endocrinology; recent follow-up visit and pt refused adjustments to medications.  Pt hopeful that fluctuations in sugars are due to recent Cushings disease with surgical resection of pituitary tumor.    Meds ordered this encounter  Medications  . traMADol (ULTRAM) 50 MG tablet    Sig: Take 1 tablet (50 mg total) by mouth every 6 (six) hours as needed.    Dispense:  60 tablet    Refill:  2    Return in about 2 months (around 04/12/2014) for complete physical examiniation.    Salam Chesterfield Elayne Guerin, M.D. [facility] [address] [place], [place]  [address] [phone] phone [phone] fax

## 2014-02-12 ENCOUNTER — Encounter: Payer: Self-pay | Admitting: Family Medicine

## 2014-02-12 LAB — SEDIMENTATION RATE: Sed Rate: 28 mm/hr — ABNORMAL HIGH (ref 0–22)

## 2014-02-13 MED ORDER — HYDROCORTISONE ACETATE 25 MG RE SUPP: 25.0000 mg | Freq: Two times a day (BID) | RECTAL | Status: DC

## 2014-02-25 ENCOUNTER — Encounter: Payer: Self-pay | Admitting: Family Medicine

## 2014-03-02 ENCOUNTER — Encounter (HOSPITAL_BASED_OUTPATIENT_CLINIC_OR_DEPARTMENT_OTHER): Payer: Self-pay

## 2014-03-02 ENCOUNTER — Inpatient Hospital Stay (HOSPITAL_BASED_OUTPATIENT_CLINIC_OR_DEPARTMENT_OTHER)
Admission: EM | Admit: 2014-03-02 | Discharge: 2014-03-04 | DRG: 603 | Disposition: A | Discharge status: Home / Self Care | Payer: BLUE CROSS/BLUE SHIELD | Attending: Family Medicine | Admitting: Family Medicine

## 2014-03-02 DIAGNOSIS — C541 Malignant neoplasm of endometrium: Secondary | ICD-10-CM | POA: Diagnosis present

## 2014-03-02 DIAGNOSIS — E282 Polycystic ovarian syndrome: Secondary | ICD-10-CM | POA: Diagnosis present

## 2014-03-02 DIAGNOSIS — Z794 Long term (current) use of insulin: Secondary | ICD-10-CM | POA: Diagnosis not present

## 2014-03-02 DIAGNOSIS — Z833 Family history of diabetes mellitus: Secondary | ICD-10-CM

## 2014-03-02 DIAGNOSIS — Z79899 Other long term (current) drug therapy: Secondary | ICD-10-CM | POA: Diagnosis not present

## 2014-03-02 DIAGNOSIS — F418 Other specified anxiety disorders: Secondary | ICD-10-CM | POA: Diagnosis present

## 2014-03-02 DIAGNOSIS — E249 Cushing's syndrome, unspecified: Secondary | ICD-10-CM | POA: Diagnosis present

## 2014-03-02 DIAGNOSIS — Z6841 Body Mass Index (BMI) 40.0 and over, adult: Secondary | ICD-10-CM

## 2014-03-02 DIAGNOSIS — Z8249 Family history of ischemic heart disease and other diseases of the circulatory system: Secondary | ICD-10-CM

## 2014-03-02 DIAGNOSIS — R197 Diarrhea, unspecified: Secondary | ICD-10-CM | POA: Diagnosis present

## 2014-03-02 DIAGNOSIS — E2749 Other adrenocortical insufficiency: Secondary | ICD-10-CM | POA: Diagnosis present

## 2014-03-02 DIAGNOSIS — E871 Hypo-osmolality and hyponatremia: Secondary | ICD-10-CM | POA: Diagnosis present

## 2014-03-02 DIAGNOSIS — I10 Essential (primary) hypertension: Secondary | ICD-10-CM | POA: Diagnosis present

## 2014-03-02 DIAGNOSIS — E1142 Type 2 diabetes mellitus with diabetic polyneuropathy: Secondary | ICD-10-CM | POA: Diagnosis present

## 2014-03-02 DIAGNOSIS — L0390 Cellulitis, unspecified: Secondary | ICD-10-CM | POA: Diagnosis present

## 2014-03-02 DIAGNOSIS — E669 Obesity, unspecified: Secondary | ICD-10-CM | POA: Diagnosis present

## 2014-03-02 DIAGNOSIS — J45909 Unspecified asthma, uncomplicated: Secondary | ICD-10-CM | POA: Diagnosis present

## 2014-03-02 DIAGNOSIS — IMO0001 Reserved for inherently not codable concepts without codable children: Secondary | ICD-10-CM | POA: Insufficient documentation

## 2014-03-02 DIAGNOSIS — A419 Sepsis, unspecified organism: Secondary | ICD-10-CM | POA: Diagnosis not present

## 2014-03-02 DIAGNOSIS — L03115 Cellulitis of right lower limb: Secondary | ICD-10-CM | POA: Diagnosis present

## 2014-03-02 HISTORY — DX: Anxiety disorder, unspecified: F41.9

## 2014-03-02 HISTORY — DX: Myoneural disorder, unspecified: G70.9

## 2014-03-02 LAB — CBC WITH DIFFERENTIAL/PLATELET
BASOS ABS: 0.1 10*3/uL (ref 0.0–0.1)
BASOS PCT: 1 % (ref 0–1)
Eosinophils Absolute: 0.5 10*3/uL (ref 0.0–0.7)
Eosinophils Relative: 3 % (ref 0–5)
HCT: 34.5 % — ABNORMAL LOW (ref 36.0–46.0)
Hemoglobin: 11.4 g/dL — ABNORMAL LOW (ref 12.0–15.0)
Lymphocytes Relative: 6 % — ABNORMAL LOW (ref 12–46)
Lymphs Abs: 1 10*3/uL (ref 0.7–4.0)
MCH: 32.2 pg (ref 26.0–34.0)
MCHC: 33 g/dL (ref 30.0–36.0)
MCV: 97.5 fL (ref 78.0–100.0)
Monocytes Absolute: 1.1 10*3/uL — ABNORMAL HIGH (ref 0.1–1.0)
Monocytes Relative: 7 % (ref 3–12)
NEUTROS PCT: 83 % — AB (ref 43–77)
Neutro Abs: 13.5 10*3/uL — ABNORMAL HIGH (ref 1.7–7.7)
Platelets: 307 10*3/uL (ref 150–400)
RBC: 3.54 MIL/uL — AB (ref 3.87–5.11)
RDW: 12.2 % (ref 11.5–15.5)
WBC: 16.2 10*3/uL — AB (ref 4.0–10.5)

## 2014-03-02 LAB — I-STAT CG4 LACTIC ACID, ED
Lactic Acid, Venous: 3.71 mmol/L (ref 0.5–2.0)
Lactic Acid, Venous: 3.02 mmol/L (ref 0.5–2.0)

## 2014-03-02 LAB — COMPREHENSIVE METABOLIC PANEL
ALT: 32 U/L (ref 0–35)
AST: 34 U/L (ref 0–37)
Albumin: 3.9 g/dL (ref 3.5–5.2)
Alkaline Phosphatase: 62 U/L (ref 39–117)
Anion gap: 4 — ABNORMAL LOW (ref 5–15)
BUN: 12 mg/dL (ref 6–23)
CO2: 26 mmol/L (ref 19–32)
CREATININE: 0.73 mg/dL (ref 0.50–1.10)
Calcium: 9.3 mg/dL (ref 8.4–10.5)
Chloride: 101 mmol/L (ref 96–112)
GFR calc Af Amer: >90 mL/min (ref >90)
GLUCOSE: 223 mg/dL — AB (ref 70–99)
Potassium: 4.3 mmol/L (ref 3.5–5.1)
SODIUM: 131 mmol/L — AB (ref 135–145)
TOTAL PROTEIN: 7.1 g/dL (ref 6.0–8.3)
Total Bilirubin: 0.4 mg/dL (ref 0.3–1.2)

## 2014-03-02 LAB — URINE MICROSCOPIC-ADD ON

## 2014-03-02 LAB — CREATININE, SERUM
Creatinine, Ser: 0.61 mg/dL (ref 0.50–1.10)
GFR calc Af Amer: >90 mL/min (ref >90)
GFR calc non Af Amer: >90 mL/min (ref >90)

## 2014-03-02 LAB — GLUCOSE, CAPILLARY
GLUCOSE-CAPILLARY: 186 mg/dL — AB (ref 70–99)
Glucose-Capillary: 220 mg/dL — ABNORMAL HIGH (ref 70–99)

## 2014-03-02 LAB — URINALYSIS, ROUTINE W REFLEX MICROSCOPIC
Bilirubin Urine: NEGATIVE
Glucose, UA: 250 mg/dL — AB
Hgb urine dipstick: NEGATIVE
Ketones, ur: NEGATIVE mg/dL
NITRITE: NEGATIVE
PROTEIN: NEGATIVE mg/dL
Specific Gravity, Urine: 1.016 (ref 1.005–1.030)
UROBILINOGEN UA: 0.2 mg/dL (ref 0.0–1.0)
pH: 5.5 (ref 5.0–8.0)

## 2014-03-02 LAB — CBC
HCT: 32.7 % — ABNORMAL LOW (ref 36.0–46.0)
HEMOGLOBIN: 11 g/dL — AB (ref 12.0–15.0)
MCH: 31.8 pg (ref 26.0–34.0)
MCHC: 33.6 g/dL (ref 30.0–36.0)
MCV: 94.5 fL (ref 78.0–100.0)
Platelets: 292 10*3/uL (ref 150–400)
RBC: 3.46 MIL/uL — AB (ref 3.87–5.11)
RDW: 12.7 % (ref 11.5–15.5)
WBC: 10.6 10*3/uL — AB (ref 4.0–10.5)

## 2014-03-02 MED ORDER — PIPERACILLIN-TAZOBACTAM 3.375 G IVPB
3.3750 g | Freq: Three times a day (TID) | INTRAVENOUS | Status: DC
  Administered 2014-03-02 – 2014-03-03 (×3): 3.375 g via INTRAVENOUS
  Filled 2014-03-02 (×6): qty 50

## 2014-03-02 MED ORDER — FLUOXETINE HCL 20 MG PO TABS
20.0000 mg | ORAL_TABLET | Freq: Every day | ORAL | Status: DC
  Administered 2014-03-03 – 2014-03-04 (×2): 20 mg via ORAL
  Filled 2014-03-02 (×2): qty 1

## 2014-03-02 MED ORDER — HYDROCODONE-ACETAMINOPHEN 5-325 MG PO TABS
1.0000 | ORAL_TABLET | Freq: Once | ORAL | Status: AC
  Administered 2014-03-02: 1 via ORAL
  Filled 2014-03-02: qty 1

## 2014-03-02 MED ORDER — VITAMIN D2 50 MCG (2000 UT) PO TABS: 1.0000 | ORAL_TABLET | Freq: Every day | ORAL | Status: DC

## 2014-03-02 MED ORDER — FAMOTIDINE 20 MG PO TABS
20.0000 mg | ORAL_TABLET | Freq: Two times a day (BID) | ORAL | Status: DC
  Administered 2014-03-02 – 2014-03-04 (×4): 20 mg via ORAL
  Filled 2014-03-02 (×6): qty 1

## 2014-03-02 MED ORDER — LIRAGLUTIDE 18 MG/3ML ~~LOC~~ SOPN: 1.8000 mg | PEN_INJECTOR | Freq: Every day | SUBCUTANEOUS | Status: DC

## 2014-03-02 MED ORDER — ZOLPIDEM TARTRATE 5 MG PO TABS
5.0000 mg | ORAL_TABLET | Freq: Every evening | ORAL | Status: DC | PRN
  Administered 2014-03-02: 5 mg via ORAL
  Filled 2014-03-02: qty 1

## 2014-03-02 MED ORDER — HYDROCORTISONE NA SUCCINATE PF 100 MG IJ SOLR
100.0000 mg | Freq: Once | INTRAMUSCULAR | Status: AC
  Administered 2014-03-02: 100 mg via INTRAVENOUS
  Filled 2014-03-02: qty 2

## 2014-03-02 MED ORDER — ATORVASTATIN CALCIUM 10 MG PO TABS
10.0000 mg | ORAL_TABLET | Freq: Every day | ORAL | Status: DC
  Filled 2014-03-02: qty 1

## 2014-03-02 MED ORDER — ACETAMINOPHEN 650 MG RE SUPP: 650.0000 mg | Freq: Four times a day (QID) | RECTAL | Status: DC | PRN

## 2014-03-02 MED ORDER — HYDROCORTISONE NA SUCCINATE PF 100 MG IJ SOLR
75.0000 mg | Freq: Four times a day (QID) | INTRAMUSCULAR | Status: AC
  Administered 2014-03-02 – 2014-03-03 (×4): 75 mg via INTRAVENOUS
  Filled 2014-03-02 (×4): qty 1.5

## 2014-03-02 MED ORDER — SODIUM CHLORIDE 0.9 % IV SOLN
Freq: Once | INTRAVENOUS | Status: AC
  Administered 2014-03-02: 11:00:00 via INTRAVENOUS

## 2014-03-02 MED ORDER — SODIUM CHLORIDE 0.9 % IV BOLUS (SEPSIS)
500.0000 mL | Freq: Once | INTRAVENOUS | Status: AC
  Administered 2014-03-02: 500 mL via INTRAVENOUS

## 2014-03-02 MED ORDER — LABETALOL HCL 200 MG PO TABS
200.0000 mg | ORAL_TABLET | Freq: Two times a day (BID) | ORAL | Status: DC
  Administered 2014-03-02 – 2014-03-04 (×4): 200 mg via ORAL
  Filled 2014-03-02 (×5): qty 1

## 2014-03-02 MED ORDER — GABAPENTIN 300 MG PO CAPS
900.0000 mg | ORAL_CAPSULE | Freq: Every day | ORAL | Status: DC
  Administered 2014-03-02 – 2014-03-03 (×2): 900 mg via ORAL
  Filled 2014-03-02 (×3): qty 3

## 2014-03-02 MED ORDER — SENNOSIDES-DOCUSATE SODIUM 8.6-50 MG PO TABS
1.0000 | ORAL_TABLET | Freq: Every day | ORAL | Status: DC
  Filled 2014-03-02 (×3): qty 1

## 2014-03-02 MED ORDER — LISINOPRIL 10 MG PO TABS
10.0000 mg | ORAL_TABLET | Freq: Every day | ORAL | Status: DC
  Administered 2014-03-03 – 2014-03-04 (×2): 10 mg via ORAL
  Filled 2014-03-02 (×2): qty 1

## 2014-03-02 MED ORDER — MORPHINE SULFATE 2 MG/ML IJ SOLN
2.0000 mg | INTRAMUSCULAR | Status: DC | PRN
  Administered 2014-03-02: 2 mg via INTRAVENOUS
  Filled 2014-03-02: qty 1

## 2014-03-02 MED ORDER — ADULT MULTIVITAMIN W/MINERALS CH
1.0000 | ORAL_TABLET | Freq: Every day | ORAL | Status: DC
  Administered 2014-03-03 – 2014-03-04 (×2): 1 via ORAL
  Filled 2014-03-02 (×2): qty 1

## 2014-03-02 MED ORDER — POLYETHYLENE GLYCOL 3350 17 G PO PACK
17.0000 g | PACK | Freq: Every day | ORAL | Status: DC | PRN
Start: 2014-03-02 — End: 2014-03-04
  Filled 2014-03-02: qty 1

## 2014-03-02 MED ORDER — VANCOMYCIN HCL IN DEXTROSE 1-5 GM/200ML-% IV SOLN
1000.0000 mg | Freq: Once | INTRAVENOUS | Status: AC
  Administered 2014-03-02: 1000 mg via INTRAVENOUS
  Filled 2014-03-02: qty 200

## 2014-03-02 MED ORDER — HYDROCORTISONE 1 % EX OINT
TOPICAL_OINTMENT | Freq: Two times a day (BID) | CUTANEOUS | Status: DC
  Filled 2014-03-02: qty 28.35

## 2014-03-02 MED ORDER — ALBUTEROL SULFATE (2.5 MG/3ML) 0.083% IN NEBU: 3.0000 mL | INHALATION_SOLUTION | RESPIRATORY_TRACT | Status: DC | PRN

## 2014-03-02 MED ORDER — ROPINIROLE HCL 0.25 MG PO TABS
0.2500 mg | ORAL_TABLET | Freq: Every day | ORAL | Status: DC
  Administered 2014-03-02 – 2014-03-03 (×2): 0.25 mg via ORAL
  Filled 2014-03-02 (×3): qty 1

## 2014-03-02 MED ORDER — TRAMADOL HCL 50 MG PO TABS: 50.0000 mg | ORAL_TABLET | Freq: Four times a day (QID) | ORAL | Status: DC | PRN

## 2014-03-02 MED ORDER — INSULIN ASPART 100 UNIT/ML ~~LOC~~ SOLN
0.0000 [IU] | Freq: Three times a day (TID) | SUBCUTANEOUS | Status: DC
  Administered 2014-03-02 – 2014-03-03 (×2): 7 [IU] via SUBCUTANEOUS
  Administered 2014-03-03: 4 [IU] via SUBCUTANEOUS
  Administered 2014-03-03: 3 [IU] via SUBCUTANEOUS
  Administered 2014-03-04: 4 [IU] via SUBCUTANEOUS
  Administered 2014-03-04: 7 [IU] via SUBCUTANEOUS

## 2014-03-02 MED ORDER — ACETAMINOPHEN 325 MG PO TABS
325.0000 mg | ORAL_TABLET | Freq: Once | ORAL | Status: AC
  Administered 2014-03-02: 325 mg via ORAL
  Filled 2014-03-02: qty 1

## 2014-03-02 MED ORDER — HYDROCORTISONE 1 % EX OINT: TOPICAL_OINTMENT | Freq: Every day | CUTANEOUS | Status: DC | PRN

## 2014-03-02 MED ORDER — PROMETHAZINE HCL 25 MG PO TABS: 25.0000 mg | ORAL_TABLET | Freq: Four times a day (QID) | ORAL | Status: DC | PRN

## 2014-03-02 MED ORDER — ONDANSETRON HCL 4 MG PO TABS: 4.0000 mg | ORAL_TABLET | Freq: Four times a day (QID) | ORAL | Status: DC | PRN

## 2014-03-02 MED ORDER — MULTIVITAMINS PO CAPS: 1.0000 | ORAL_CAPSULE | Freq: Every day | ORAL | Status: DC

## 2014-03-02 MED ORDER — SODIUM CHLORIDE 0.9 % IV BOLUS (SEPSIS): 1000.0000 mL | Freq: Once | INTRAVENOUS | Status: DC

## 2014-03-02 MED ORDER — SODIUM CHLORIDE 0.9 % IV BOLUS (SEPSIS)
1000.0000 mL | Freq: Once | INTRAVENOUS | Status: AC
  Administered 2014-03-02: 1000 mL via INTRAVENOUS

## 2014-03-02 MED ORDER — VITAMIN D3 25 MCG (1000 UNIT) PO TABS
2000.0000 [IU] | ORAL_TABLET | Freq: Every day | ORAL | Status: DC
  Administered 2014-03-03 – 2014-03-04 (×2): 2000 [IU] via ORAL
  Filled 2014-03-02 (×2): qty 2

## 2014-03-02 MED ORDER — HYDROCORTISONE 2.5 % EX OINT: TOPICAL_OINTMENT | Freq: Two times a day (BID) | CUTANEOUS | Status: DC

## 2014-03-02 MED ORDER — LORAZEPAM 0.5 MG PO TABS
0.5000 mg | ORAL_TABLET | Freq: Two times a day (BID) | ORAL | Status: DC | PRN
  Administered 2014-03-02 – 2014-03-03 (×2): 0.5 mg via ORAL
  Filled 2014-03-02 (×2): qty 1

## 2014-03-02 MED ORDER — HYDROCHLOROTHIAZIDE 12.5 MG PO CAPS
12.5000 mg | ORAL_CAPSULE | Freq: Every day | ORAL | Status: DC
  Administered 2014-03-03 – 2014-03-04 (×2): 12.5 mg via ORAL
  Filled 2014-03-02 (×2): qty 1

## 2014-03-02 MED ORDER — VANCOMYCIN HCL 10 G IV SOLR
1250.0000 mg | Freq: Three times a day (TID) | INTRAVENOUS | Status: DC
  Administered 2014-03-02 – 2014-03-03 (×3): 1250 mg via INTRAVENOUS
  Filled 2014-03-02 (×5): qty 1250

## 2014-03-02 MED ORDER — ACETAMINOPHEN 325 MG PO TABS: 650.0000 mg | ORAL_TABLET | Freq: Four times a day (QID) | ORAL | Status: DC | PRN

## 2014-03-02 MED ORDER — INSULIN ASPART 100 UNIT/ML ~~LOC~~ SOLN: 0.0000 [IU] | Freq: Three times a day (TID) | SUBCUTANEOUS | Status: DC

## 2014-03-02 MED ORDER — SODIUM CHLORIDE 0.9 % IJ SOLN
3.0000 mL | Freq: Two times a day (BID) | INTRAMUSCULAR | Status: DC
  Administered 2014-03-02 – 2014-03-04 (×3): 3 mL via INTRAVENOUS

## 2014-03-02 MED ORDER — ONDANSETRON HCL 4 MG/2ML IJ SOLN: 4.0000 mg | Freq: Four times a day (QID) | INTRAMUSCULAR | Status: DC | PRN

## 2014-03-02 MED ORDER — INSULIN GLARGINE 100 UNIT/ML ~~LOC~~ SOLN
75.0000 [IU] | Freq: Every day | SUBCUTANEOUS | Status: DC
  Administered 2014-03-02 – 2014-03-03 (×2): 75 [IU] via SUBCUTANEOUS
  Filled 2014-03-02 (×3): qty 0.75

## 2014-03-02 MED ORDER — HEPARIN SODIUM (PORCINE) 5000 UNIT/ML IJ SOLN
5000.0000 [IU] | Freq: Three times a day (TID) | INTRAMUSCULAR | Status: DC
  Administered 2014-03-02 – 2014-03-04 (×5): 5000 [IU] via SUBCUTANEOUS
  Filled 2014-03-02 (×7): qty 1

## 2014-03-02 MED ORDER — LISINOPRIL-HYDROCHLOROTHIAZIDE 20-12.5 MG PO TABS: 1.0000 | ORAL_TABLET | Freq: Every morning | ORAL | Status: DC

## 2014-03-02 MED ORDER — ALUM & MAG HYDROXIDE-SIMETH 200-200-20 MG/5ML PO SUSP: 30.0000 mL | Freq: Four times a day (QID) | ORAL | Status: DC | PRN

## 2014-03-02 MED ORDER — INSULIN ASPART 100 UNIT/ML ~~LOC~~ SOLN
22.0000 [IU] | Freq: Three times a day (TID) | SUBCUTANEOUS | Status: DC
  Administered 2014-03-03 – 2014-03-04 (×5): 22 [IU] via SUBCUTANEOUS

## 2014-03-02 MED ORDER — HYDROCORTISONE ACETATE 25 MG RE SUPP
25.0000 mg | Freq: Two times a day (BID) | RECTAL | Status: DC
  Administered 2014-03-03 (×2): 25 mg via RECTAL
  Filled 2014-03-02 (×5): qty 1

## 2014-03-02 MED ORDER — PIPERACILLIN-TAZOBACTAM 3.375 G IVPB 30 MIN
3.3750 g | Freq: Once | INTRAVENOUS | Status: AC
  Administered 2014-03-02: 3.375 g via INTRAVENOUS
  Filled 2014-03-02 (×2): qty 50

## 2014-03-02 NOTE — Plan of Care (Signed)
Problem: Phase I Progression Outcomes Goal: Wound assessment- dressing change as appropriate Outcome: Completed/Met Date Met:  03/02/14 Right shin and foot reddened, very warm to touch. Outlined in marker by ED RN. No drainage.

## 2014-03-02 NOTE — Progress Notes (Signed)
Admission note:   Arrival Method: From [facility] via [place]. Mental Status: A&Ox4 Telemetry: Placed on box #1.   Skin: Cellulitis to RLE, right foot. Outlined in marker.  Tubes: N/A IV: NSL in RAC, LAC Pain: Denies.  Family: No one at bedside. Living Situation: Home with husband. Safety Measures: Call bell within reach. Bed in lowest position. 6E Orientation: Oriented to unit and surroundings.  Joellen Jersey, RN.

## 2014-03-02 NOTE — ED Notes (Signed)
Report given to Cybil, RN on unit.

## 2014-03-02 NOTE — Progress Notes (Signed)
Dr. Lajuana Ripple notified of patient's arrival to unit.  Joellen Jersey, RN.

## 2014-03-02 NOTE — Consult Note (Signed)
[place] for :   Vancomycin and Zosyn Indication:  Recurrent Cellulitis  Hospital Problems: Active Problems:   Cellulitis   Allergies: Allergies  Allergen Reactions  . Antibacterial Hand Soap [Triclosan] Other (See Comments) and Rash    Dry, itchy patches on skin    Patient Measurements: Height: 5\' 5"  (165.1 cm) Weight: 276 lb 8 oz (125.42 kg) IBW/kg (Calculated) : 57 kg Vital Signs: Temp: 98.5 F (36.9 C) (02/22 1625) Temp Source: Oral (02/22 1625) BP: 144/64 mmHg (02/22 1625) Pulse Rate: 115 (02/22 1625)  Recent Labs  03/02/14 1045  WBC 16.2*  HGB 11.4*  PLT 307  CREATININE 0.73   Estimated Creatinine Clearance: 124.5 mL/min (by C-G formula based on Cr of 0.73).  Microbiology: No results found for this or any previous visit (from the past 720 hour(s)).  Medical/Surgical History: Past Medical History  Diagnosis Date  . Cellulitis of lower leg     x3 on right leg  . Hypertension   . Irregular menstrual bleeding   . PCOS (polycystic ovarian syndrome)   . Tachycardia   . Abnormal Pap smear 2000  . Endometrial ca 12/20/2011  . Asthma     no issues in years  . Family history of anesthesia complication     sister had hard time waking up and problems breathing  . Cushing's syndrome 09/25/2013    MRI done on 10/02/2013  . 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 Diabetes mellitus   . Anxiety   . Neuromuscular disorder     DIABETIC NEUROPATHY   Past Surgical History  Procedure Laterality Date  . Hysteroscopy w/d&c  01/24/2012    Procedure: DILATATION AND CURETTAGE /HYSTEROSCOPY;  Surgeon: Betsy Coder, MD;  Location: [facility];  Service: Gynecology;  Laterality: N/A;  with vulvar biopsies   . Intrauterine device (iud) insertion  01/24/2012    Procedure: INTRAUTERINE DEVICE (IUD) INSERTION;  Surgeon: Betsy Coder, MD;  Location: [facility];  Service: Gynecology;   Laterality: N/A;  . Dilation and curettage of uterus    . Robotic assisted total hysterectomy with bilateral salpingo oopherectomy Bilateral 07/02/2012    Procedure: ROBOTIC ASSISTED TOTAL HYSTERECTOMY WITH BILATERAL SALPINGO OOPHORECTOMY ;  Surgeon: Imagene Gurney A. Alycia Rossetti, MD;  Location: [facility];  Service: Gynecology;  Laterality: Bilateral;  . Abdominal hysterectomy    . Transsphenoid hypophysectomy  11/26/2013    ACTH secreting pituitary lesion causing Cushing's; [facility]; Angelene Giovanni, MD    Current Medication[s] Include: Medication PTA: Prescriptions prior to admission  Medication Sig Dispense Refill Last Dose  . albuterol (PROVENTIL) (2.5 MG/3ML) 0.083% nebulizer solution Take 2.5 mg by nebulization every 6 (six) hours as needed for wheezing or shortness of breath.   over 30 days  . atorvastatin (LIPITOR) 10 MG tablet Take 1 tablet (10 mg total) by mouth daily at 6 PM. 90 tablet 3 over 30 days  . BAYER CONTOUR TEST test strip   98 unknown  . Ergocalciferol (VITAMIN D2) 2000 UNITS TABS Take 1 tablet by mouth.   03/02/2014 at Unknown time  . FLUoxetine (PROZAC) 20 MG tablet Take 1 tablet (20 mg total) by mouth daily. 30 tablet 5 03/02/2014 at Unknown time  . gabapentin (NEURONTIN) 300 MG capsule Take 900 mg by mouth at bedtime.   03/01/2014 at Unknown time  . hydrocortisone (ANUSOL-HC) 25 MG suppository Place 1 suppository (25 mg total) rectally 2 (two) times daily. (Patient  taking differently: Place 25 mg rectally daily as needed for hemorrhoids. ) 12 suppository 3 Past Week at Unknown time  . hydrocortisone 2.5 % ointment Apply topically 2 (two) times daily. (Patient taking differently: Apply 1 application topically daily as needed (inflammation, itching). ) 30 g 1 Past Week at Unknown time  . insulin aspart (NOVOLOG FLEXPEN) 100 UNIT/ML FlexPen Inject 22 units with a correction 1:50 > 150 three times daily before meals   03/02/2014 at Unknown time  . Insulin Glargine (LANTUS SOLOSTAR) 100  UNIT/ML Solostar Pen Inject 80 Units into the skin at bedtime.    03/02/2014 at Unknown time  . labetalol (NORMODYNE) 200 MG tablet Take 1 tablet (200 mg total) by mouth 2 (two) times daily. 60 tablet 3 03/02/2014 at 0800  . Liraglutide (VICTOZA) 18 MG/3ML SOPN Inject 1.8 mg into the skin daily.    03/02/2014 at Unknown time  . lisinopril-hydrochlorothiazide (PRINZIDE,ZESTORETIC) 10-12.5 MG per tablet Take 1 tablet by mouth daily.  10 03/02/2014 at Unknown time  . LORazepam (ATIVAN) 0.5 MG tablet Take 1 tablet (0.5 mg total) by mouth 2 (two) times daily as needed for anxiety. (Patient taking differently: Take 0.5 mg by mouth daily. ) 45 tablet 2 03/01/2014 at Unknown time  . metformin (FORTAMET) 500 MG (OSM) 24 hr tablet Take 1,000 mg by mouth 2 (two) times daily with a meal.    03/02/2014 at Unknown time  . Multiple Vitamin (MULTIVITAMIN) capsule Take 1 capsule by mouth 2 (two) times daily.    03/02/2014 at Unknown time  . Omega-3 1000 MG CAPS Take 3 g by mouth.   03/02/2014 at Unknown time  . ondansetron (ZOFRAN) 8 MG tablet Take 8 mg by mouth every 8 (eight) hours as needed for nausea or vomiting.    Past Month at Unknown time  . OVER THE COUNTER MEDICATION Take 1 packet by mouth 2 (two) times daily. Nutralite multi  Vitamins   03/02/2014 at Unknown time  . OVER THE COUNTER MEDICATION Take 1 tablet by mouth 2 (two) times daily. Glucose Health Cromium Piccolate   03/02/2014 at Unknown time  . predniSONE (DELTASONE) 1 MG tablet Take 7 mg by mouth daily.   03/02/2014 at Unknown time  . PROAIR HFA 108 (90 BASE) MCG/ACT inhaler Inhale 2 puffs into the lungs every 4 (four) hours as needed for shortness of breath.    03/01/2014 at Unknown time  . Probiotic Product (PROBIOTIC DAILY PO) Take 1 tablet by mouth daily.    03/02/2014 at Unknown time  . promethazine (PHENERGAN) 25 MG tablet Take 12.5 mg by mouth every 6 (six) hours as needed for nausea or vomiting.    Past Month at Unknown time  . ranitidine (ZANTAC) 150 MG  tablet Take 150 mg by mouth 2 (two) times daily.    03/02/2014 at Unknown time  . rOPINIRole (REQUIP) 0.25 MG tablet Take 1 tablet (0.25 mg total) by mouth at bedtime. 30 tablet 2 03/01/2014 at Unknown time  . traMADol (ULTRAM) 50 MG tablet Take 1 tablet (50 mg total) by mouth every 6 (six) hours as needed. (Patient taking differently: Take 50 mg by mouth every 6 (six) hours as needed for moderate pain or severe pain. ) 60 tablet 2 03/01/2014 at Unknown time  . silver sulfADIAZINE (SILVADENE) 1 % cream Apply 1 application topically daily. Apply dime size thickness to affected area daily (Patient not taking: Reported on 02/11/2014) 50 g 0 Not Taking at Unknown time    Prior to  Admission: Scheduled:  . [START ON 03/03/2014] cholecalciferol  2,000 Units Oral Daily  . famotidine  20 mg Oral BID  . [START ON 03/03/2014] FLUoxetine  20 mg Oral Daily  . gabapentin  900 mg Oral QHS  . heparin  5,000 Units Subcutaneous 3 times per day  . [START ON 03/03/2014] lisinopril  10 mg Oral Daily   And  . [START ON 03/03/2014] hydrochlorothiazide  12.5 mg Oral Daily  . hydrocortisone  25 mg Rectal BID  . hydrocortisone sod succinate (SOLU-CORTEF) inj  75 mg Intravenous Q6H  . insulin aspart  0-20 Units Subcutaneous TID WC  . [START ON 03/03/2014] insulin aspart  22 Units Subcutaneous TID WC  . insulin glargine  75 Units Subcutaneous QHS  . labetalol  200 mg Oral BID  . Liraglutide  1.8 mg Subcutaneous Daily  . [START ON 03/03/2014] multivitamin with minerals  1 tablet Oral Daily  . piperacillin-tazobactam  3.375 g Intravenous Q8H  . rOPINIRole  0.25 mg Oral QHS  . senna-docusate  1 tablet Oral QHS  . sodium chloride  3 mL Intravenous Q12H  . vancomycin (VANCOCIN) 1250 mg IVPB  1,250 mg Intravenous Q8H   Antibiotic[s]: Anti-infectives    Start     Dose/Rate Route Frequency Ordered Stop   03/02/14 1900  vancomycin (VANCOCIN) 1,250 mg in sodium chloride 0.9 % 250 mL IVPB     1,250 mg 166.7 mL/hr over 90 Minutes  Intravenous Every 8 hours 03/02/14 1900     03/02/14 1900  piperacillin-tazobactam (ZOSYN) IVPB 3.375 g     3.375 g 12.5 mL/hr over 240 Minutes Intravenous Every 8 hours 03/02/14 1900        Assessment:  39 y/o female with history of recurrent cellulitis of the R-leg, DM, HTN, Cushings syndrome is admitted with presumed recurrence of leg cellulitis.  Patient received single dose of Vancomycin at [facility] today.  Vancomycin and Zosyn ordered per Pharmacy consult.  Pt weight 125 kg, CrCl 124 ml/min, WBC 16.2, Afebrile.   Goal of Therapy:  Vancomycin and Zosyn dosed for clinical indication and adjusted for renal function.  Eradication of infection. Follow up cultures, clinical course, HD schedule, antibiotic levels as clinically indicated, and adjust as indicated.  Vancomycin trough level 15-20 mcg/ml due to multiple recurrences.  Plan:  1. Vancomycin 1250 mg IV q 8 hours +  Zosyn 3.375 gm IV q 8 hours, each dose to infuse over 4 hours 2. Monitor renal function, WBC, fever curve, any cultures/sensitivities, antibiotic levels as clinically indicated, and clinical progression.  Avett Reineck, Craig Guess,  Pharm.D,    2/22/20167:28 PM

## 2014-03-02 NOTE — ED Notes (Signed)
Pt reports hx of cellulitis in right leg. Now reports redness to right foot. Warmth noted to right foot.

## 2014-03-02 NOTE — ED Provider Notes (Signed)
CSN: [id number]     Arrival date & time 03/02/14  1007 History   First MD Initiated Contact with Patient 03/02/14 [phone]     Chief Complaint  Patient presents with  . Rash     Patient is a 39 y.o. female presenting with rash. The history is provided by the patient. No language interpreter was used.  Rash  Kelli Allen presents for evaluation of pain and swelling to the right foot. She woke this morning noticing some pain and swelling in the right foot. Later in the morning she had increased pain and redness to the area as well as pain and redness to the right shin. She has chronic skin changes to the right shin due to recurrent cellulitis but the redness is much worse than usual. She denies any fevers, cough, vomiting. She's been dealing with diarrhea recently. She denies any dysuria. She has a history of adrenal insufficiency following pituitary surgery last November.  Past Medical History  Diagnosis Date  . Diabetes mellitus   . Cellulitis of lower leg     x3 on right leg  . Hypertension   . Irregular menstrual bleeding   . PCOS (polycystic ovarian syndrome)   . Tachycardia   . Abnormal Pap smear 2000  . Endometrial ca 12/20/2011  . Asthma     no issues in years  . Family history of anesthesia complication     sister had hard time waking up and problems breathing  . Cushing's syndrome 09/25/2013    MRI done on 10/02/2013  . 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 Hysteroscopy w/d&c  01/24/2012    Procedure: DILATATION AND CURETTAGE /HYSTEROSCOPY;  Surgeon: Betsy Coder, MD;  Location: [facility];  Service: Gynecology;  Laterality: N/A;  with vulvar biopsies   . Intrauterine device (iud) insertion  01/24/2012    Procedure: INTRAUTERINE DEVICE (IUD) INSERTION;  Surgeon: Betsy Coder, MD;  Location: [facility];  Service: Gynecology;  Laterality: N/A;  . Dilation and  curettage of uterus    . Robotic assisted total hysterectomy with bilateral salpingo oopherectomy Bilateral 07/02/2012    Procedure: ROBOTIC ASSISTED TOTAL HYSTERECTOMY WITH BILATERAL SALPINGO OOPHORECTOMY ;  Surgeon: Imagene Gurney A. Alycia Rossetti, MD;  Location: [facility];  Service: Gynecology;  Laterality: Bilateral;  . Abdominal hysterectomy    . Transsphenoid hypophysectomy  11/26/2013    ACTH secreting pituitary lesion causing Cushing's; [facility]; Angelene Giovanni, MD   Family History  Problem Relation Age of Onset  . Diabetes type II Mother   . Coronary artery disease Mother   . Diabetes Mother   . Heart disease Mother   . Hyperlipidemia Mother   . Hypertension Mother   . Diabetes type II Father   . Coronary artery disease Father   . Diabetes Father   . Heart disease Father   . Hyperlipidemia Father   . Heart disease Sister   . Diabetes Sister    History  Substance Use Topics  . Smoking status: Never Smoker   . Smokeless tobacco: Never Used  . Alcohol Use: 0.0 oz/week    0 Standard drinks or equivalent per week     Comment: rare   OB History    Gravida Para Term Preterm AB TAB SAB Ectopic Multiple Living   0              Review of Systems  Skin: Positive  for rash.  All other systems reviewed and are negative.     Allergies  Antibacterial hand soap  Home Medications   Prior to Admission medications   Medication Sig Start Date End Date Taking? Authorizing Provider  atorvastatin (LIPITOR) 10 MG tablet Take 1 tablet (10 mg total) by mouth daily at 6 PM. 10/15/13   Wardell Honour, MD  BAYER CONTOUR TEST test strip  12/12/13   Historical Provider, MD  Ergocalciferol (VITAMIN D2) 2000 UNITS TABS Take 1 tablet by mouth.    Historical Provider, MD  FLUoxetine (PROZAC) 20 MG tablet Take 1 tablet (20 mg total) by mouth daily. 01/08/14   Wardell Honour, MD  gabapentin (NEURONTIN) 300 MG capsule Take 900 mg by mouth at bedtime.    Historical Provider, MD  hydrocortisone (ANUSOL-HC) 25 MG  suppository Place 1 suppository (25 mg total) rectally 2 (two) times daily. 02/13/14   Wardell Honour, MD  hydrocortisone 2.5 % ointment Apply topically 2 (two) times daily. 01/08/14   Wardell Honour, MD  insulin aspart (NOVOLOG FLEXPEN) 100 UNIT/ML FlexPen Inject 15 units with a correction 1:50 > 150 three times daily before meals 10/10/13   Historical Provider, MD  insulin aspart (NOVOLOG) 100 UNIT/ML injection Inject 15 Units into the skin 3 (three) times daily with meals. Sliding scale    Historical Provider, MD  Insulin Glargine (LANTUS SOLOSTAR) 100 UNIT/ML Solostar Pen Inject 70 Units into the skin. 09/25/13   Historical Provider, MD  labetalol (NORMODYNE) 200 MG tablet Take 1 tablet (200 mg total) by mouth 2 (two) times daily. 01/01/14   Kathlen Brunswick, PA-C  Liraglutide (VICTOZA) 18 MG/3ML SOPN Inject 1.8 mg into the skin. 10/17/13   Historical Provider, MD  lisinopril-hydrochlorothiazide (PRINZIDE,ZESTORETIC) 20-12.5 MG per tablet Take 1 tablet by mouth every morning.     Historical Provider, MD  LORazepam (ATIVAN) 0.5 MG tablet Take 1 tablet (0.5 mg total) by mouth 2 (two) times daily as needed for anxiety. 01/08/14   Wardell Honour, MD  metformin (FORTAMET) 500 MG (OSM) 24 hr tablet Take 1,000 mg by mouth 2 (two) times daily with a meal.     Historical Provider, MD  Multiple Vitamin (MULTIVITAMIN) capsule Take 1 capsule by mouth.    Historical Provider, MD  NON FORMULARY Take 1 capsule by mouth 2 (two) times daily. nutralite omega 3    Historical Provider, MD  Omega-3 1000 MG CAPS Take 3 g by mouth.    Historical Provider, MD  OVER THE COUNTER MEDICATION Take 1 packet by mouth 2 (two) times daily. Nutralite multi  Vitamins    Historical Provider, MD  OVER THE COUNTER MEDICATION Take 1 tablet by mouth 2 (two) times daily. Glucose Health Cromium Piccolate    Historical Provider, MD  predniSONE (DELTASONE) 10 MG tablet 10 mg.  11/29/13   Historical Provider, MD  PROAIR HFA 108 (90 BASE)  MCG/ACT inhaler Inhale 2 puffs into the lungs every 4 (four) hours as needed for shortness of breath.  11/21/12   Historical Provider, MD  Probiotic Product (PROBIOTIC DAILY PO) Take by mouth.    Historical Provider, MD  promethazine (PHENERGAN) 25 MG tablet Take 25 mg by mouth every 6 (six) hours as needed for nausea or vomiting.    Historical Provider, MD  ranitidine (ZANTAC) 150 MG tablet Take 150 mg by mouth. 11/29/13   Historical Provider, MD  rOPINIRole (REQUIP) 0.25 MG tablet Take 1 tablet (0.25 mg total) by mouth at bedtime. 01/08/14  Wardell Honour, MD  senna-docusate (SENOKOT-S) 8.6-50 MG per tablet Take 2 tablets by mouth. 11/29/13   Historical Provider, MD  silver sulfADIAZINE (SILVADENE) 1 % cream Apply 1 application topically daily. Apply dime size thickness to affected area daily Patient not taking: Reported on 02/11/2014 09/03/13   Thao P Le, DO  traMADol (ULTRAM) 50 MG tablet Take 1 tablet (50 mg total) by mouth every 6 (six) hours as needed. 02/11/14   Wardell Honour, MD  UNABLE TO FIND Liver health. 1 tablet by mouth twice daily.    Historical Provider, MD   BP 136/76 mmHg  Pulse 117  Temp(Src) 98.8 F (37.1 C) (Oral)  Resp 18  Ht 5\' 5"  (1.651 m)  Wt 276 lb (125.193 kg)  BMI 45.93 kg/m2  SpO2 99%  LMP 01/17/2012 Physical Exam  Constitutional: She is oriented to person, place, and time. She appears well-developed and well-nourished.  HENT:  Head: Normocephalic and atraumatic.  Cardiovascular: Regular rhythm.   No murmur heard. Tachycardic  Pulmonary/Chest: Effort normal and breath sounds normal. No respiratory distress.  Abdominal: Soft. There is no tenderness. There is no rebound and no guarding.  Musculoskeletal:  Moderate swelling to the right lower extremity. Erythema and swelling to the right anterior shin from 5 cm distal to the knee to just proximal to the ankle. There is no drainage or abscess. The right foot has motor moderate swelling, erythema, and tenderness  in the mid foot to the great toe. There is no abscess. There is mild swelling to the left lower extremity without any appreciable erythema or tenderness.  Neurological: She is alert and oriented to person, place, and time.  Skin: Skin is warm and dry.  Psychiatric: She has a normal mood and affect. Her behavior is normal.  Nursing note and vitals reviewed.   ED Course  Procedures (including critical care time) Labs Review Labs Reviewed  COMPREHENSIVE METABOLIC PANEL - Abnormal; Notable for the following:    Sodium 131 (*)    Glucose, Bld 223 (*)    Anion gap 4 (*)    All other components within normal limits  CBC WITH DIFFERENTIAL/PLATELET - Abnormal; Notable for the following:    WBC 16.2 (*)    RBC 3.54 (*)    Hemoglobin 11.4 (*)    HCT 34.5 (*)    Neutrophils Relative % 83 (*)    Neutro Abs 13.5 (*)    Lymphocytes Relative 6 (*)    Monocytes Absolute 1.1 (*)    All other components within normal limits  URINALYSIS, ROUTINE W REFLEX MICROSCOPIC - Abnormal; Notable for the following:    Glucose, UA 250 (*)    Leukocytes, UA TRACE (*)    All other components within normal limits  URINE MICROSCOPIC-ADD ON - Abnormal; Notable for the following:    Casts HYALINE CASTS (*)    All other components within normal limits  GLUCOSE, CAPILLARY - Abnormal; Notable for the following:    Glucose-Capillary 220 (*)    All other components within normal limits  CBC - Abnormal; Notable for the following:    WBC 10.6 (*)    RBC 3.46 (*)    Hemoglobin 11.0 (*)    HCT 32.7 (*)    All other components within normal limits  CBC - Abnormal; Notable for the following:    RBC 3.56 (*)    Hemoglobin 11.1 (*)    HCT 34.5 (*)    All other components within normal limits  GLUCOSE,  CAPILLARY - Abnormal; Notable for the following:    Glucose-Capillary 186 (*)    All other components within normal limits  I-STAT CG4 LACTIC ACID, ED - Abnormal; Notable for the following:    Lactic Acid, Venous 3.71  (*)    All other components within normal limits  I-STAT CG4 LACTIC ACID, ED - Abnormal; Notable for the following:    Lactic Acid, Venous 3.02 (*)    All other components within normal limits  URINE CULTURE  CLOSTRIDIUM DIFFICILE BY PCR  CREATININE, SERUM  PROTIME-INR  APTT  LACTIC ACID, PLASMA  CORTISOL-AM, BLOOD  HEMOGLOBIN A1C  COMPREHENSIVE METABOLIC PANEL  I-STAT CG4 LACTIC ACID, ED  I-STAT CG4 LACTIC ACID, ED  I-STAT CG4 LACTIC ACID, ED    Imaging Review No results found.   EKG Interpretation   Date/Time:  Monday March 02 2014 10:36:13 EST Ventricular Rate:  114 PR Interval:  172 QRS Duration: 86 QT Interval:  312 QTC Calculation: 430 R Axis:   52 Text Interpretation:  Sinus tachycardia Otherwise normal ECG Confirmed by  Hazle Coca [phone]) on 03/02/2014 11:04:27 AM      MDM   Final diagnoses:  Cellulitis of right lower extremity  Hyponatremia  Sepsis, due to unspecified organism    Patient here for evaluation of right foot pain. History and presentation are consistent with cellulitis. Patient meets sepsis criteria based on her leukocytosis, tachycardia and saline skin infection. Patient was given stress dose steroids given her history of adrenal insufficiency. Discussed with family medicine service regarding admission for further treatment.    Quintella Reichert, MD 03/03/14 0700

## 2014-03-02 NOTE — H&P (Signed)
[facility] Admission History and Physical Service Pager: [phone]  Patient name: Kelli Allen Medical record number: [id number] Date of birth: 30-Nov-1975 Age: 39 y.o. Gender: female  Primary Care Provider: Reginia Forts, MD Consultants: None Code Status: Full   Chief Complaint: RLE cellulitis  Assessment and Plan: Kelli Allen is a 39 y.o. female presenting with Recurrent RLE cellulits. PMH is significant for asthma, right lower leg cellulitis, Cushing's disease, diabetes type 2 with polyneuropathy, HLD, endometrial cancer, hypertension, polycystic ovary, sinus tachycardia.  Right lower extremity cellulitis: Patient states this is a recurrent right lower extremity cellulitis that has been  occurring for the last few years. Her last admission, a few months ago, resulted in sepsis and ICU admission, partially secondary to her adrenal insufficiency. White count 16.2. - Admit to tele with close BP monitoring, low threshold to transfer to stepdown if VS become unstable.  - Vanc and zosyn per pharmacy - Stress dose steroids load in ED 100 mg solu-cortef, will continue at 75 mg q6 for 24 hours, then taper over 72 hours.  - Am cortisol - Am lactic acid, 3.71 >> 3.02 on admission  - Area of erythema is marked on the LE with marker.   Cushing's disease: Patient with history of Cushing's disease/adrenal insufficiency with pituitary tumor removed in November 2015. Patient is followed by [facility] She[facility] at [facility], and is on 7 mg daily of prednisone for the past month. Patient with mild hyponatremia of 131 on admission. - Am cortisol level, although patient has hernia receive stress dose - She received 100 mg Solu-Cortef stress dose in emergency room, will continue at 75 mg every 6 hours for 24 hours, then taper over 72 hours. - Monitor blood pressure and electrolytes  Diarrhea: Patient states she took antibiotics sometime in December, she doesn't recall which  ones. - C. difficile PCR - Enteric precautions  Diabetes: Glucose on admission 223. Patient states her diabetes is "getting better". Patient's home regimen is 22 units NovoLog 3 times a day prior to meals. Victoza 1.8 mg daily. Metformin (24-hour tablet)1000 mg BID. Lantus 75 mg daily at bedtime. Last A1c was in August/2015 and was 8.2. - Lantus 75 mg daily at bedtime - NovoLog 22 units 3 times a day before meals - Resistant sliding scale for obesity/steroids - CBG monitoring - Repeat A1c  Hypertension: Patient normotensive to mildly hypertensive on admission. - We'll continue lisinopril/HCTZ  Tachycardia: Patient with a known history of tachycardia, does not appear to be on any beta blockers at this time. Will continue to monitor.  Depression/anxiety: Patient with history of depression/anxiety. Home meds consist of Ativan 0.5 mg twice a day when necessary and Prozac 20 mg daily. - Continue home meds  FEN/GI: Carb modified diet, monitor closely for need of IV fluids Prophylaxis: Heparin Subcutaneous, PPI  Disposition: Pending clinical course improvement and PT evaluation  History of Present Illness: Kelli Allen is a 39 y.o. female presenting with right lower extremity recurrent cellulitis. Patient has a significant history of adrenal insufficiency secondary to pituitary tumor removal in November 2015, as well as diabetes on high doses of insulin. Patient presented to the [facility] for recurrent right lower extremity cellulitis that she noticed increased this morning upon awakening. She reports some of the redness of her right calf is chronic, and has never fully resolved since her admission in August 2015. She does endorse increased pain to the area today. She reports a subjective fever at home, without chills. She  also endorses diarrhea and mild fatigue over the last few days. Patient states she has a history of a fast heartbeat. She has her updated insulin regimen printed  out with her today.  Review Of Systems: Per HPI with the following additions: per HPI Otherwise 12 point review of systems was performed and was unremarkable.  Patient Active Problem List   Diagnosis Date Noted  . Cellulitis 03/02/2014  . Type 2 diabetes mellitus with diabetic polyneuropathy 01/13/2014  . Sinus tachycardia by electrocardiogram 01/01/2014  . Cushing's disease 10/15/2013  . Endometrial cancer 07/03/2012  . Elevated cholesterol with high triglycerides 03/02/2011  . Cellulitis, leg 02/26/2011  . Diabetes mellitus type 2, uncontrolled, without complications 79/89/2119  . HTN (hypertension) 02/26/2011  . POLYCYSTIC OVARY 03/08/2006  . Obesity 03/08/2006  . ASTHMA, UNSPECIFIED 03/08/2006   Past Medical History: Past Medical History  Diagnosis Date  . Cellulitis of lower leg     x3 on right leg  . Hypertension   . Irregular menstrual bleeding   . PCOS (polycystic ovarian syndrome)   . Tachycardia   . Abnormal Pap smear 2000  . Endometrial ca 12/20/2011  . Asthma     no issues in years  . Family history of anesthesia complication     sister had hard time waking up and problems breathing  . Cushing's syndrome 09/25/2013    MRI done on 10/02/2013  . 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 Diabetes mellitus   . Anxiety   . Neuromuscular disorder     DIABETIC NEUROPATHY   Past Surgical History: Past Surgical History  Procedure Laterality Date  . Hysteroscopy w/d&c  01/24/2012    Procedure: DILATATION AND CURETTAGE /HYSTEROSCOPY;  Surgeon: Betsy Coder, MD;  Location: [facility];  Service: Gynecology;  Laterality: N/A;  with vulvar biopsies   . Intrauterine device (iud) insertion  01/24/2012    Procedure: INTRAUTERINE DEVICE (IUD) INSERTION;  Surgeon: Betsy Coder, MD;  Location: [facility];  Service: Gynecology;  Laterality: N/A;  . Dilation and curettage of uterus    . Robotic assisted total  hysterectomy with bilateral salpingo oopherectomy Bilateral 07/02/2012    Procedure: ROBOTIC ASSISTED TOTAL HYSTERECTOMY WITH BILATERAL SALPINGO OOPHORECTOMY ;  Surgeon: Imagene Gurney A. Alycia Rossetti, MD;  Location: [facility];  Service: Gynecology;  Laterality: Bilateral;  . Abdominal hysterectomy    . Transsphenoid hypophysectomy  11/26/2013    ACTH secreting pituitary lesion causing Cushing's; [facility]; Angelene Giovanni, MD   Social History: History  Substance Use Topics  . Smoking status: Never Smoker   . Smokeless tobacco: Never Used  . Alcohol Use: 0.0 oz/week    0 Standard drinks or equivalent per week     Comment: rare   Additional social history: Lives with husband Please also refer to relevant sections of EMR.  Family History: Family History  Problem Relation Age of Onset  . Diabetes type II Mother   . Coronary artery disease Mother   . Diabetes Mother   . Heart disease Mother   . Hyperlipidemia Mother   . Hypertension Mother   . Diabetes type II Father   . Coronary artery disease Father   . Diabetes Father   . Heart disease Father   . Hyperlipidemia Father   . Heart disease Sister   . Diabetes Sister    Allergies and Medications: Allergies  Allergen Reactions  . Antibacterial Hand Soap [Triclosan] Other (See Comments) and Rash  Dry, itchy patches on skin   No current facility-administered medications on file prior to encounter.   Current Outpatient Prescriptions on File Prior to Encounter  Medication Sig Dispense Refill  . atorvastatin (LIPITOR) 10 MG tablet Take 1 tablet (10 mg total) by mouth daily at 6 PM. 90 tablet 3  . BAYER CONTOUR TEST test strip   98  . Ergocalciferol (VITAMIN D2) 2000 UNITS TABS Take 1 tablet by mouth.    Marland Kitchen FLUoxetine (PROZAC) 20 MG tablet Take 1 tablet (20 mg total) by mouth daily. 30 tablet 5  . gabapentin (NEURONTIN) 300 MG capsule Take 900 mg by mouth at bedtime.    . hydrocortisone (ANUSOL-HC) 25 MG suppository Place 1 suppository (25 mg  total) rectally 2 (two) times daily. (Patient taking differently: Place 25 mg rectally daily as needed for hemorrhoids. ) 12 suppository 3  . hydrocortisone 2.5 % ointment Apply topically 2 (two) times daily. (Patient taking differently: Apply 1 application topically daily as needed (inflammation, itching). ) 30 g 1  . insulin aspart (NOVOLOG FLEXPEN) 100 UNIT/ML FlexPen Inject 22 units with a correction 1:50 > 150 three times daily before meals    . Insulin Glargine (LANTUS SOLOSTAR) 100 UNIT/ML Solostar Pen Inject 80 Units into the skin at bedtime.     Marland Kitchen labetalol (NORMODYNE) 200 MG tablet Take 1 tablet (200 mg total) by mouth 2 (two) times daily. 60 tablet 3  . Liraglutide (VICTOZA) 18 MG/3ML SOPN Inject 1.8 mg into the skin daily.     Marland Kitchen LORazepam (ATIVAN) 0.5 MG tablet Take 1 tablet (0.5 mg total) by mouth 2 (two) times daily as needed for anxiety. (Patient taking differently: Take 0.5 mg by mouth daily. ) 45 tablet 2  . metformin (FORTAMET) 500 MG (OSM) 24 hr tablet Take 1,000 mg by mouth 2 (two) times daily with a meal.     . Multiple Vitamin (MULTIVITAMIN) capsule Take 1 capsule by mouth 2 (two) times daily.     . Omega-3 1000 MG CAPS Take 3 g by mouth.    Marland Kitchen OVER THE COUNTER MEDICATION Take 1 packet by mouth 2 (two) times daily. Nutralite multi  Vitamins    . OVER THE COUNTER MEDICATION Take 1 tablet by mouth 2 (two) times daily. Glucose Health Cromium Piccolate    . PROAIR HFA 108 (90 BASE) MCG/ACT inhaler Inhale 2 puffs into the lungs every 4 (four) hours as needed for shortness of breath.     . Probiotic Product (PROBIOTIC DAILY PO) Take 1 tablet by mouth daily.     . promethazine (PHENERGAN) 25 MG tablet Take 12.5 mg by mouth every 6 (six) hours as needed for nausea or vomiting.     . ranitidine (ZANTAC) 150 MG tablet Take 150 mg by mouth 2 (two) times daily.     Marland Kitchen rOPINIRole (REQUIP) 0.25 MG tablet Take 1 tablet (0.25 mg total) by mouth at bedtime. 30 tablet 2  . traMADol (ULTRAM) 50 MG  tablet Take 1 tablet (50 mg total) by mouth every 6 (six) hours as needed. (Patient taking differently: Take 50 mg by mouth every 6 (six) hours as needed for moderate pain or severe pain. ) 60 tablet 2  . silver sulfADIAZINE (SILVADENE) 1 % cream Apply 1 application topically daily. Apply dime size thickness to affected area daily (Patient not taking: Reported on 02/11/2014) 50 g 0    Objective: BP 144/64 mmHg  Pulse 115  Temp(Src) 98.5 F (36.9 C) (Oral)  Resp 18  Ht 5\' 5"  (1.651 m)  Wt 276 lb 8 oz (125.42 kg)  BMI 46.01 kg/m2  SpO2 97%  LMP 01/17/2012 Exam: Gen: NAD. Lying in bed, appears happy, very talkative. Comfortable. HEENT: AT. [place]. Moon facies.  Bilateral eyes without injections or icterus. MMM.  CV: Tachycardic, regular rhythm, no murmur appreciated Chest: CTAB, no wheeze or crackles Abd: Soft. Mildly obese. NTND. BS present. Ext: No edema. Tenderness to palpation over erythemic areas. Right lower extremity with erythema of the foot and calf/shin area. Marked with purple marker. Skin: No rashes, purpura or petechiae. Cellulitis per above. Neuro: PERLA. EOMi. Alert. Oriented. Moves all 4 extremities without difficulty. Psych: Talkative. Normal speech. Normal affect, mood and demeanor.   Labs and Imaging: CBC BMET   Recent Labs Lab 03/02/14 1045  WBC 16.2*  HGB 11.4*  HCT 34.5*  PLT 307    Recent Labs Lab 03/02/14 1045  NA 131*  K 4.3  CL 101  CO2 26  BUN 12  CREATININE 0.73  GLUCOSE 223*  CALCIUM 9.3     Lactic acid 3.02 EKG: ST, otherwise normal. (history of sinus tach)  No imaging.  No results found.   Ma Hillock, DO 03/02/2014, 6:53 PM PGY-3, [facility] Intern pager: [phone], text pages welcome

## 2014-03-03 ENCOUNTER — Encounter: Payer: Self-pay | Admitting: Family Medicine

## 2014-03-03 DIAGNOSIS — A419 Sepsis, unspecified organism: Secondary | ICD-10-CM

## 2014-03-03 DIAGNOSIS — L03115 Cellulitis of right lower limb: Principal | ICD-10-CM

## 2014-03-03 DIAGNOSIS — IMO0001 Reserved for inherently not codable concepts without codable children: Secondary | ICD-10-CM | POA: Insufficient documentation

## 2014-03-03 DIAGNOSIS — E871 Hypo-osmolality and hyponatremia: Secondary | ICD-10-CM

## 2014-03-03 LAB — COMPREHENSIVE METABOLIC PANEL
ALT: 34 U/L (ref 0–35)
AST: 23 U/L (ref 0–37)
Albumin: 3.6 g/dL (ref 3.5–5.2)
Alkaline Phosphatase: 55 U/L (ref 39–117)
Anion gap: 6 (ref 5–15)
BUN: 11 mg/dL (ref 6–23)
CHLORIDE: 105 mmol/L (ref 96–112)
CO2: 27 mmol/L (ref 19–32)
Calcium: 9.5 mg/dL (ref 8.4–10.5)
Creatinine, Ser: 0.65 mg/dL (ref 0.50–1.10)
GFR calc non Af Amer: >90 mL/min (ref >90)
Glucose, Bld: 214 mg/dL — ABNORMAL HIGH (ref 70–99)
POTASSIUM: 4.3 mmol/L (ref 3.5–5.1)
Sodium: 138 mmol/L (ref 135–145)
TOTAL PROTEIN: 6.4 g/dL (ref 6.0–8.3)
Total Bilirubin: 0.4 mg/dL (ref 0.3–1.2)

## 2014-03-03 LAB — GLUCOSE, CAPILLARY
GLUCOSE-CAPILLARY: 222 mg/dL — AB (ref 70–99)
GLUCOSE-CAPILLARY: 154 mg/dL — AB (ref 70–99)
Glucose-Capillary: 198 mg/dL — ABNORMAL HIGH (ref 70–99)
Glucose-Capillary: 126 mg/dL — ABNORMAL HIGH (ref 70–99)

## 2014-03-03 LAB — APTT: aPTT: 29 seconds (ref 24–37)

## 2014-03-03 LAB — CBC
HCT: 34.5 % — ABNORMAL LOW (ref 36.0–46.0)
Hemoglobin: 11.1 g/dL — ABNORMAL LOW (ref 12.0–15.0)
MCH: 31.2 pg (ref 26.0–34.0)
MCHC: 32.2 g/dL (ref 30.0–36.0)
MCV: 96.9 fL (ref 78.0–100.0)
PLATELETS: 283 10*3/uL (ref 150–400)
RBC: 3.56 MIL/uL — AB (ref 3.87–5.11)
RDW: 12.6 % (ref 11.5–15.5)
WBC: 8.1 10*3/uL (ref 4.0–10.5)

## 2014-03-03 LAB — PROTIME-INR
INR: 1.06 (ref 0.00–1.49)
Prothrombin Time: 13.9 seconds (ref 11.6–15.2)

## 2014-03-03 LAB — URINE CULTURE
Colony Count: NO GROWTH
Culture: NO GROWTH

## 2014-03-03 LAB — CORTISOL-AM, BLOOD: Cortisol - AM: 5 ug/dL (ref 4.3–22.4)

## 2014-03-03 LAB — LACTIC ACID, PLASMA: LACTIC ACID, VENOUS: 1.8 mmol/L (ref 0.5–2.0)

## 2014-03-03 MED ORDER — CLINDAMYCIN HCL 150 MG PO CAPS
450.0000 mg | ORAL_CAPSULE | Freq: Four times a day (QID) | ORAL | Status: DC
  Administered 2014-03-03 – 2014-03-04 (×4): 450 mg via ORAL
  Filled 2014-03-03 (×7): qty 1

## 2014-03-03 MED ORDER — HYDROCORTISONE NA SUCCINATE PF 100 MG IJ SOLR
50.0000 mg | Freq: Four times a day (QID) | INTRAMUSCULAR | Status: DC
  Administered 2014-03-03 – 2014-03-04 (×4): 50 mg via INTRAVENOUS
  Filled 2014-03-03 (×7): qty 1

## 2014-03-03 MED ORDER — HYDROCODONE-ACETAMINOPHEN 5-325 MG PO TABS
1.0000 | ORAL_TABLET | Freq: Four times a day (QID) | ORAL | Status: DC | PRN
  Administered 2014-03-03 – 2014-03-04 (×2): 1 via ORAL
  Filled 2014-03-03: qty 2
  Filled 2014-03-03: qty 1

## 2014-03-03 NOTE — Progress Notes (Signed)
UR Completed.  [phone]  

## 2014-03-03 NOTE — Progress Notes (Signed)
PT Cancellation Note  Patient Details Name: Kelli Allen MRN: [id number] DOB: [birth year]   Cancelled Treatment:    Reason Eval/Treat Not Completed: Patient not medically ready;Medical issues which prohibited therapy;Other (comment) (Doctor cancelled PT per his request).  .MD cancellation of evaluation.   Ramond Dial 03/03/2014, 9:51 AM   Mee Hives, PT MS Acute Rehab Dept. Number: [phone]

## 2014-03-03 NOTE — Progress Notes (Signed)
OT Cancellation Note  Patient Details Name: Kelli Allen MRN: [id number] DOB: [birth year]   Cancelled Treatment:    Reason Eval/Treat Not Completed: Other (MD present in room when OT arrived; stated patient did not need OT consult. OT will sign off.)  [name] A 03/03/2014, 9:38 AM

## 2014-03-03 NOTE — Progress Notes (Signed)
Family Medicine Teaching Service Daily Progress Note Intern Pager: [phone]  Patient name: Kelli Allen Medical record number: [id number] Date of birth: 1975-01-20 Age: 39 y.o. Gender: female  Primary Care Provider: Reginia Forts, MD Consultants: None  Code Status: Full  Pt Overview and Major Events to Date:  2/22 - 2/23 - Admitted with RLE cellulitis. Improving. Switch to po antibiotics.   Assessment and Plan: Kelli Allen is a 39 y.o. female presenting with Recurrent RLE cellulits. PMH is significant for asthma, right lower leg cellulitis, Cushing's disease, diabetes type 2 with polyneuropathy, HLD, endometrial cancer, hypertension, polycystic ovary, sinus tachycardia.  Right lower extremity cellulitis: Patient states this is a recurrent right lower extremity cellulitis that has been occurring for the last few years. Her last admission, a few months ago, resulted in sepsis and ICU admission, partially secondary to her adrenal insufficiency.  - Admit to tele with close BP monitoring, low threshold to transfer to stepdown if VS become unstable.  - Vanc and zosyn per pharmacy >> Transition to PO clindamycin today. 450mg  QID.   - Stress dose steroids 75 mg q6 for 24 hours >> To 50mg  Q6 hrs. Today. Taper over 72 hours.   - Am cortisol - 5.  - WBC improving from 16 > 8 - Am lactic acid improved to 1.8 - Erythema improved today. Cellulitis significantly retreated from border.  - Febrile in the ED initially, but afebrile here.   Cushing's disease: Patient with history of Cushing's disease/adrenal insufficiency with pituitary tumor removed in November 2015. Patient is followed by [facility] Sheer at [facility], and is on 7 mg daily of prednisone for the past month. - Am cortisol level, although patient has already receive stress dose > 5 - Solu-Cortef as above.  - Normotensive here.  - Mild hyponatremia 131Na > 138 Na.  - Monitor blood pressure and electrolytes  Diabetes: Glucose on  admission 223. Patient states her diabetes is "getting better". Patient's home regimen is 22 units NovoLog 3 times a day prior to meals. Victoza 1.8 mg daily. Metformin (24-hour tablet)1000 mg BID. Lantus 75 mg daily at bedtime. Last A1c was in August/2015 and was 8.2. - Lantus 75 mg daily at bedtime - NovoLog 22 units 3 times a day before meals - Resistant sliding scale for obesity/steroids - CBG monitoring >> in the mid 100's. Continue to monitor closely.  - Repeat A1c > pending.   Hypertension: Patient normotensive - We'll continue lisinopril/HCTZ  Tachycardia: Patient with a known history of tachycardia, does not appear to be on any beta blockers at this time. Will continue to monitor. - HR Upper 90's - 100.   Depression/anxiety: Patient with history of depression/anxiety. Home meds consist of Ativan 0.5 mg twice a day when necessary and Prozac 20 mg daily. - Continue home meds - Ambien for sleep   FEN/GI: Carb modified diet, monitor closely for need of IV fluids Prophylaxis: Heparin Subcutaneous, PPI  Disposition: Home with steroid taper once improved.   Subjective:  No acute events overnight. Patient feels that she is much better this morning. She says that she feels much better this admission than with her prior admissions, and is in good spirits. She denies diarrhea this morning. She still has some mild right lower extremity pain, but she says that her redness is much improved from what it was before. She says that she has some baseline skin discoloration due to chronic recurrent lower extremity cellulitis in that area. She denies fevers chills, rigors. She does not feel  dizzy, weak, short of breath. No other complaints.  Objective: Temp:  [98.1 F (36.7 C)-100.1 F (37.8 C)] 98.3 F (36.8 C) (02/23 0850) Pulse Rate:  [94-117] 102 (02/23 0850) Resp:  [10-25] 18 (02/23 0850) BP: (110-150)/(47-76) 110/67 mmHg (02/23 0850) SpO2:  [97 %-100 %] 97 % (02/23 0850) Weight:  [276 lb  (125.193 kg)-276 lb 8 oz (125.42 kg)] 276 lb 8 oz (125.42 kg) (02/22 1625) Physical Exam: General: NAD, AAox3 Cardiovascular: RRR, No MGR, Normal S1/S2, 2+ distal pulses.  Respiratory: CTA Bilaterally, no crackles, rales. Unlabored, appropriate rate.  Abdomen: S, NT, ND +Bs Extremities: Area of erythema only slightly pink with what appears to be skin discoloration in the center of the outlined area. Erythema significantly retreated from outlined border over leg and foot. She Has some TTP of her RLE and R foot. No purulence.  Neuro: No focal deficits.   Laboratory:  Recent Labs Lab 03/02/14 1045 03/02/14 1955 03/03/14 0530  WBC 16.2* 10.6* 8.1  HGB 11.4* 11.0* 11.1*  HCT 34.5* 32.7* 34.5*  PLT 307 292 283    Recent Labs Lab 03/02/14 1045 03/02/14 1955 03/03/14 0530  NA 131*  --  138  K 4.3  --  4.3  CL 101  --  105  CO2 26  --  27  BUN 12  --  11  CREATININE 0.73 0.61 0.65  CALCIUM 9.3  --  9.5  PROT 7.1  --  6.4  BILITOT 0.4  --  0.4  ALKPHOS 62  --  55  ALT 32  --  34  AST 34  --  23  GLUCOSE 223*  --  214*   Lactic acid 3.02 > 1.8 EKG: ST, otherwise normal. (history of sinus tach)  A1C - pending  AM Cortosol - 5.0  Imaging/Diagnostic Tests:   Aquilla Hacker, MD 03/03/2014, 9:57 AM PGY-1, [place] Intern pager: [phone], text pages welcome

## 2014-03-04 LAB — BASIC METABOLIC PANEL
ANION GAP: 7 (ref 5–15)
BUN: 9 mg/dL (ref 6–23)
CALCIUM: 9.7 mg/dL (ref 8.4–10.5)
CO2: 28 mmol/L (ref 19–32)
CREATININE: 0.64 mg/dL (ref 0.50–1.10)
Chloride: 107 mmol/L (ref 96–112)
GFR calc Af Amer: >90 mL/min (ref >90)
GFR calc non Af Amer: >90 mL/min (ref >90)
GLUCOSE: 203 mg/dL — AB (ref 70–99)
Potassium: 4 mmol/L (ref 3.5–5.1)
Sodium: 142 mmol/L (ref 135–145)

## 2014-03-04 LAB — CBC WITH DIFFERENTIAL/PLATELET
Basophils Absolute: 0.1 10*3/uL (ref 0.0–0.1)
Basophils Relative: 1 % (ref 0–1)
Eosinophils Absolute: 0.2 10*3/uL (ref 0.0–0.7)
Eosinophils Relative: 3 % (ref 0–5)
HCT: 31.8 % — ABNORMAL LOW (ref 36.0–46.0)
HEMOGLOBIN: 10.3 g/dL — AB (ref 12.0–15.0)
Lymphocytes Relative: 34 % (ref 12–46)
Lymphs Abs: 2.7 10*3/uL (ref 0.7–4.0)
MCH: 30.9 pg (ref 26.0–34.0)
MCHC: 32.4 g/dL (ref 30.0–36.0)
MCV: 95.5 fL (ref 78.0–100.0)
MONO ABS: 0.6 10*3/uL (ref 0.1–1.0)
MONOS PCT: 8 % (ref 3–12)
Neutro Abs: 4.2 10*3/uL (ref 1.7–7.7)
Neutrophils Relative %: 54 % (ref 43–77)
Platelets: 299 10*3/uL (ref 150–400)
RBC: 3.33 MIL/uL — ABNORMAL LOW (ref 3.87–5.11)
RDW: 12.7 % (ref 11.5–15.5)
WBC: 7.8 10*3/uL (ref 4.0–10.5)

## 2014-03-04 LAB — GLUCOSE, CAPILLARY
Glucose-Capillary: 202 mg/dL — ABNORMAL HIGH (ref 70–99)
Glucose-Capillary: 185 mg/dL — ABNORMAL HIGH (ref 70–99)
Glucose-Capillary: 150 mg/dL — ABNORMAL HIGH (ref 70–99)

## 2014-03-04 LAB — HEMOGLOBIN A1C
Hgb A1c MFr Bld: 8.8 % — ABNORMAL HIGH (ref 4.8–5.6)
MEAN PLASMA GLUCOSE: 206 mg/dL

## 2014-03-04 MED ORDER — HYDROCODONE-ACETAMINOPHEN 5-325 MG PO TABS: 1.0000 | ORAL_TABLET | Freq: Four times a day (QID) | ORAL | Status: DC | PRN

## 2014-03-04 MED ORDER — CLINDAMYCIN HCL 150 MG PO CAPS: 450.0000 mg | ORAL_CAPSULE | Freq: Four times a day (QID) | ORAL | Status: AC

## 2014-03-04 NOTE — Discharge Summary (Signed)
[facility] Discharge Summary  Patient name: Kelli Allen Medical record number: [id number] Date of birth: May 22, 1975 Age: 39 y.o. Gender: female Date of Admission: 03/02/2014  Date of Discharge: 03/04/2014 Admitting Physician: Blane Ohara Ledell Codrington, MD  Primary Care Provider: Reginia Forts, MD Consultants: None  Indication for Hospitalization: Cellulitis  Discharge Diagnoses/Problem List:   RLE Cellulitis - recurrent Asthma Cushing's Disease (s/p Pituitary resection) DMII with polyneuropathy HLD Endometrial Cancer HTN PCOS Sinus Tachycardia  Disposition: Home  Discharge Condition: Stable  Discharge Exam:  General: NAD, AAox3 Cardiovascular: RRR, No MGR, Normal S1/S2, 2+ distal pulses.  Respiratory: CTA Bilaterally, no crackles, rales. Unlabored, appropriate rate.  Abdomen: S, NT, ND +Bs Extremities: Significant retreat from marked borders, almost no redness visible. Foot completely without erythema. No purulence. TTP much improved from yesterday. 1+ peripheral edema bilaterally. WWP Neuro: No focal deficits.   Brief Hospital Course:  Kelli Allen is a 39 y.o. female presenting with Recurrent RLE cellulits. PMH is significant for asthma, right lower leg cellulitis, Cushing's disease, diabetes type 2 with polyneuropathy, HLD, endometrial cancer, hypertension, polycystic ovary, sinus tachycardia.  Right lower extremity cellulitis: Patient was admitted with recurrence of her right lower extremity cellulitis that has occurred a few times over the last few years. Her last admission, a few months ago, resulted in sepsis and ICU admission, partially secondary to her adrenal insufficiency. In the ED workup was significant for mildly elevated WBC, she has been afebrile since admission. She ws started on Vancomycin and Zosyn due to concern over recurrence and prior history of sepsis and soft blood pressures in the setting of Secondary Adrenal Insufficiency.  Area of cellulitis was outlined. She showed rapid improvement in erythema and pain with IV antibiotics by day 2, and she tolerated switch to PO clindamycin. She tolerated this for one more day in the hospital, and was found to be safe for discharge home with close follow up with her PCP.   Cushing's Disease - Pt. With history of cushing's disease s/p Pituitary tumor resection 11/2013. She has secondary adrenal insufficiency as a result. She has previously required stress dose steroids in the setting of infection, and we also did that at this hospitalization. She was placed on Solu-Cortef during her admission which was tapered over 2 days. She did not have any hypotension, and tolerated this treatment well. Due to the short course and rapid improvement, she was found to be safe for discharge on her home regimen of Prednisone 7mg  daily at discharge with close follow up with her PCP. AM Cortisol here - 5  DM II - Pt. With DMII on extensive regimen at home. Continued here except for the Metformin. A1C this admission was 8.8. She did maintain CBG's in the 100's to 200's despite stress dose steroids on her home regimen. Otherwise, no acute changes in her therapy, and we continued her home regimen at discharge.   HTN -  Continued on HCTZ / Lisinopril - BP's normotensive here.   Tachycardia - Known history of baseline tachycardia. No BB's at this time. HR 90's - 100 here.   Depression / Anxiety -  Pt. On Ativan 0.5mg  BID prn, and Prozac 20mg  daily. Continued here. Ambien added for sleep which the patient tolerated very well.   Issues for Follow Up:  1. Follow up improvement of cellulitis and antibiotic compliance 2. Follow up outpatient steroid tolerance 3. Follow up Glucose control.   Significant Procedures: None  Significant Labs and Imaging:   Recent Labs Lab 03/02/14  1955 03/03/14 0530 03/04/14 0620  WBC 10.6* 8.1 7.8  HGB 11.0* 11.1* 10.3*  HCT 32.7* 34.5* 31.8*  PLT 292 283 299     Recent Labs Lab 03/02/14 1045 03/02/14 1955 03/03/14 0530 03/04/14 0620  NA 131*  --  138 142  K 4.3  --  4.3 4.0  CL 101  --  105 107  CO2 26  --  27 28  GLUCOSE 223*  --  214* 203*  BUN 12  --  11 9  CREATININE 0.73 0.61 0.65 0.64  CALCIUM 9.3  --  9.5 9.7  ALKPHOS 62  --  55  --   AST 34  --  23  --   ALT 32  --  34  --   ALBUMIN 3.9  --  3.6  --    AM Cortisol - 5 Lactic Acid 3 > 1.8   Results/Tests Pending at Time of Discharge: None  Discharge Medications:    Medication List    TAKE these medications        atorvastatin 10 MG tablet  Commonly known as:  LIPITOR  Take 1 tablet (10 mg total) by mouth daily at 6 PM.     BAYER CONTOUR TEST test strip  Generic drug:  glucose blood     clindamycin 150 MG capsule  Commonly known as:  CLEOCIN  Take 3 capsules (450 mg total) by mouth every 6 (six) hours.     FLUoxetine 20 MG tablet  Commonly known as:  PROZAC  Take 1 tablet (20 mg total) by mouth daily.     gabapentin 300 MG capsule  Commonly known as:  NEURONTIN  Take 900 mg by mouth at bedtime.     HYDROcodone-acetaminophen 5-325 MG per tablet  Commonly known as:  NORCO/VICODIN  Take 1-2 tablets by mouth every 6 (six) hours as needed for moderate pain.     hydrocortisone 2.5 % ointment  Apply topically 2 (two) times daily.     hydrocortisone 25 MG suppository  Commonly known as:  ANUSOL-HC  Place 1 suppository (25 mg total) rectally 2 (two) times daily.     labetalol 200 MG tablet  Commonly known as:  NORMODYNE  Take 1 tablet (200 mg total) by mouth 2 (two) times daily.     LANTUS SOLOSTAR 100 UNIT/ML Solostar Pen  Generic drug:  Insulin Glargine  Inject 80 Units into the skin at bedtime.     lisinopril-hydrochlorothiazide 10-12.5 MG per tablet  Commonly known as:  PRINZIDE,ZESTORETIC  Take 1 tablet by mouth daily.     LORazepam 0.5 MG tablet  Commonly known as:  ATIVAN  Take 1 tablet (0.5 mg total) by mouth 2 (two) times daily as needed  for anxiety.     metformin 500 MG (OSM) 24 hr tablet  Commonly known as:  FORTAMET  Take 1,000 mg by mouth 2 (two) times daily with a meal.     multivitamin capsule  Take 1 capsule by mouth 2 (two) times daily.     NOVOLOG FLEXPEN 100 UNIT/ML FlexPen  Generic drug:  insulin aspart  Inject 22 units with a correction 1:50 > 150 three times daily before meals     Omega-3 1000 MG Caps  Take 3 g by mouth.     ondansetron 8 MG tablet  Commonly known as:  ZOFRAN  Take 8 mg by mouth every 8 (eight) hours as needed for nausea or vomiting.     OVER THE COUNTER MEDICATION  Take 1 packet by mouth 2 (two) times daily. Nutralite multi  Vitamins     OVER THE COUNTER MEDICATION  - Take 1 tablet by mouth 2 (two) times daily. Glucose Health  - Cromium Piccolate     predniSONE 1 MG tablet  Commonly known as:  DELTASONE  Take 7 mg by mouth daily.     albuterol (2.5 MG/3ML) 0.083% nebulizer solution  Commonly known as:  PROVENTIL  Take 2.5 mg by nebulization every 6 (six) hours as needed for wheezing or shortness of breath.     PROAIR HFA 108 (90 BASE) MCG/ACT inhaler  Generic drug:  albuterol  Inhale 2 puffs into the lungs every 4 (four) hours as needed for shortness of breath.     PROBIOTIC DAILY PO  Take 1 tablet by mouth daily.     promethazine 25 MG tablet  Commonly known as:  PHENERGAN  Take 12.5 mg by mouth every 6 (six) hours as needed for nausea or vomiting.     rOPINIRole 0.25 MG tablet  Commonly known as:  REQUIP  Take 1 tablet (0.25 mg total) by mouth at bedtime.     silver sulfADIAZINE 1 % cream  Commonly known as:  SILVADENE  Apply 1 application topically daily. Apply dime size thickness to affected area daily     traMADol 50 MG tablet  Commonly known as:  ULTRAM  Take 1 tablet (50 mg total) by mouth every 6 (six) hours as needed.     VICTOZA 18 MG/3ML Sopn  Generic drug:  Liraglutide  Inject 1.8 mg into the skin daily.     Vitamin D2 2000 UNITS Tabs  Take 1  tablet by mouth.     ZANTAC 150 MG tablet  Generic drug:  ranitidine  Take 150 mg by mouth 2 (two) times daily.        Discharge Instructions: Please refer to Patient Instructions section of EMR for full details.  Patient was counseled important signs and symptoms that should prompt return to medical care, changes in medications, dietary instructions, activity restrictions, and follow up appointments.   Follow-Up Appointments: Follow-up Information    Follow up with SMITH,KRISTI, MD. Schedule an appointment as soon as possible for a visit in 1 week.   Specialty:  Family Medicine   Why:  Hospital Follow Up    Contact information:   [place] [address] [phone]       Aquilla Hacker, MD 03/04/2014, 4:44 PM PGY-1, [place]

## 2014-03-04 NOTE — Progress Notes (Signed)
Karl Pock to be D/C'd Home per MD order.  Discussed prescriptions and follow up appointments with the patient. Prescriptions given to patient, medication list explained in detail. Pt verbalized understanding.    Medication List    TAKE these medications        atorvastatin 10 MG tablet  Commonly known as:  LIPITOR  Take 1 tablet (10 mg total) by mouth daily at 6 PM.     BAYER CONTOUR TEST test strip  Generic drug:  glucose blood     clindamycin 150 MG capsule  Commonly known as:  CLEOCIN  Take 3 capsules (450 mg total) by mouth every 6 (six) hours.     FLUoxetine 20 MG tablet  Commonly known as:  PROZAC  Take 1 tablet (20 mg total) by mouth daily.     gabapentin 300 MG capsule  Commonly known as:  NEURONTIN  Take 900 mg by mouth at bedtime.     HYDROcodone-acetaminophen 5-325 MG per tablet  Commonly known as:  NORCO/VICODIN  Take 1-2 tablets by mouth every 6 (six) hours as needed for moderate pain.     hydrocortisone 2.5 % ointment  Apply topically 2 (two) times daily.     hydrocortisone 25 MG suppository  Commonly known as:  ANUSOL-HC  Place 1 suppository (25 mg total) rectally 2 (two) times daily.     labetalol 200 MG tablet  Commonly known as:  NORMODYNE  Take 1 tablet (200 mg total) by mouth 2 (two) times daily.     LANTUS SOLOSTAR 100 UNIT/ML Solostar Pen  Generic drug:  Insulin Glargine  Inject 80 Units into the skin at bedtime.     lisinopril-hydrochlorothiazide 10-12.5 MG per tablet  Commonly known as:  PRINZIDE,ZESTORETIC  Take 1 tablet by mouth daily.     LORazepam 0.5 MG tablet  Commonly known as:  ATIVAN  Take 1 tablet (0.5 mg total) by mouth 2 (two) times daily as needed for anxiety.     metformin 500 MG (OSM) 24 hr tablet  Commonly known as:  FORTAMET  Take 1,000 mg by mouth 2 (two) times daily with a meal.     multivitamin capsule  Take 1 capsule by mouth 2 (two) times daily.     NOVOLOG FLEXPEN 100 UNIT/ML FlexPen  Generic drug:   insulin aspart  Inject 22 units with a correction 1:50 > 150 three times daily before meals     Omega-3 1000 MG Caps  Take 3 g by mouth.     ondansetron 8 MG tablet  Commonly known as:  ZOFRAN  Take 8 mg by mouth every 8 (eight) hours as needed for nausea or vomiting.     OVER THE COUNTER MEDICATION  Take 1 packet by mouth 2 (two) times daily. Nutralite multi  Vitamins     OVER THE COUNTER MEDICATION  - Take 1 tablet by mouth 2 (two) times daily. Glucose Health  - Cromium Piccolate     predniSONE 1 MG tablet  Commonly known as:  DELTASONE  Take 7 mg by mouth daily.     albuterol (2.5 MG/3ML) 0.083% nebulizer solution  Commonly known as:  PROVENTIL  Take 2.5 mg by nebulization every 6 (six) hours as needed for wheezing or shortness of breath.     PROAIR HFA 108 (90 BASE) MCG/ACT inhaler  Generic drug:  albuterol  Inhale 2 puffs into the lungs every 4 (four) hours as needed for shortness of breath.     PROBIOTIC DAILY PO  Take 1 tablet by mouth daily.     promethazine 25 MG tablet  Commonly known as:  PHENERGAN  Take 12.5 mg by mouth every 6 (six) hours as needed for nausea or vomiting.     rOPINIRole 0.25 MG tablet  Commonly known as:  REQUIP  Take 1 tablet (0.25 mg total) by mouth at bedtime.     silver sulfADIAZINE 1 % cream  Commonly known as:  SILVADENE  Apply 1 application topically daily. Apply dime size thickness to affected area daily     traMADol 50 MG tablet  Commonly known as:  ULTRAM  Take 1 tablet (50 mg total) by mouth every 6 (six) hours as needed.     VICTOZA 18 MG/3ML Sopn  Generic drug:  Liraglutide  Inject 1.8 mg into the skin daily.     Vitamin D2 2000 UNITS Tabs  Take 1 tablet by mouth.     ZANTAC 150 MG tablet  Generic drug:  ranitidine  Take 150 mg by mouth 2 (two) times daily.        Filed Vitals:   03/04/14 0930  BP: 148/72  Pulse: 85  Temp: 98.3 F (36.8 C)  Resp: 18    Skin clean, dry and intact without evidence of skin  break down, no evidence of skin tears noted. IV catheter discontinued intact. Site without signs and symptoms of complications. Dressing and pressure applied. Pt denies pain at this time. No complaints noted.  An After Visit Summary was printed and given to the patient. Patient escorted by RN Alcide Evener A, and D/C home via private auto.  Juelz Whittenberg A 03/04/2014 4:35 PM

## 2014-03-04 NOTE — Progress Notes (Signed)
Inpatient Diabetes Program Recommendations  AACE/ADA: New Consensus Statement on Inpatient Glycemic Control (2013)  Target Ranges:  Prepandial:   less than 140 mg/dL      Peak postprandial:   less than 180 mg/dL (1-2 hours)      Critically ill patients:  140 - 180 mg/dL   Results for KOREA, SEVERS (MRN [id number]) as of 03/04/2014 11:01  Ref. Range 03/03/2014 07:42 03/03/2014 11:30 03/03/2014 16:40 03/03/2014 21:39 03/04/2014 08:14  Glucose-Capillary Latest Range: 70-99 mg/dL 198 (H) 126 (H) 222 (H) 154 (H) 202 (H)    Reason for Visit: recurrent RLE cellulitis  Diabetes history: DM 2 Outpatient Diabetes medications: Lantus 80 units QHS, Novolog 22 units TID meal coverage, Liraglutide 1.8mg  Daily, Metformin 1,00 mg BID  Current orders for Inpatient glycemic control: Lantus 75 units QHS, Novolog 0-20 units TID, Novolog 22 units TID meal coverage, Liraglutide 1.8mg  Daily  Inpatient Diabetes Program Recommendations Insulin - Meal Coverage: Noted IV steroid decreased from 75mg  to 50 mg. Patient's glucose is still increasing around meal times. Please consider increasing meal coverage to Novolog 25 units TID.  Thanks,  Tama Headings RN, MSN, [facility] Inpatient Diabetes Coordinator Team Pager [phone]

## 2014-03-04 NOTE — Progress Notes (Signed)
CARE MANAGEMENT NOTE 03/04/2014  Patient:  Kelli Allen, Kelli Allen   Account Number:  [id number]  Date Initiated:  03/04/2014  Documentation initiated by:  [facility]  Subjective/Objective Assessment:   cellulitis     Action/Plan:   Anticipated DC Date:  03/04/2014   Anticipated DC Plan:  [place]  CM consult      Choice offered to / List presented to:             Status of service:  Completed, signed off Medicare Important Message given?  NO (If response is "NO", the following Medicare IM given date fields will be blank) Date Medicare IM given:   Medicare IM given by:   Date Additional Medicare IM given:   Additional Medicare IM given by:    Discharge Disposition:  HOME/SELF CARE  Per UR Regulation:  Reviewed for med. necessity/level of care/duration of stay  If discussed at [place] of Stay Meetings, dates discussed:    Comments:  03/04/2014 1530 NCM spoke to pt and states she lives with a friend. States she does have a nebulizer machine at home. States she is able to afford her medications.  Jonnie Finner RN CCM Case Mgmt phone [phone]

## 2014-03-04 NOTE — Discharge Instructions (Signed)
We are glad that you are doing so much better  It is important to continue to monitor your symptoms whenever you return home. If you develop worsening redness of your lower extremity, drainage, or worsening pain, fevers, chills, nausea, or vomiting, then please return to the ED for evaluation.   Continue to take the Clindamycin for 10 more days until 03/12/2014.   You will also be able to return to your home dose of 7mg  of prednisone which you will start taking again tomorrow.   Schedule an appointment with Dr. Tamala Julian for follow up in 1 week.   Cellulitis Cellulitis is an infection of the skin and the tissue beneath it. The infected area is usually red and tender. Cellulitis occurs most often in the arms and lower legs.  CAUSES  Cellulitis is caused by bacteria that enter the skin through cracks or cuts in the skin. The most common types of bacteria that cause cellulitis are staphylococci and streptococci. SIGNS AND SYMPTOMS   Redness and warmth.  Swelling.  Tenderness or pain.  Fever. DIAGNOSIS  Your health care provider can usually determine what is wrong based on a physical exam. Blood tests may also be done. TREATMENT  Treatment usually involves taking an antibiotic medicine. HOME CARE INSTRUCTIONS   Take your antibiotic medicine as directed by your health care provider. Finish the antibiotic even if you start to feel better.  Keep the infected arm or leg elevated to reduce swelling.  Apply a warm cloth to the affected area up to 4 times per day to relieve pain.  Take medicines only as directed by your health care provider.  Keep all follow-up visits as directed by your health care provider. SEEK MEDICAL CARE IF:   You notice red streaks coming from the infected area.  Your red area gets larger or turns dark in color.  Your bone or joint underneath the infected area becomes painful after the skin has healed.  Your infection returns in the same area or another  area.  You notice a swollen bump in the infected area.  You develop new symptoms.  You have a fever. SEEK IMMEDIATE MEDICAL CARE IF:   You feel very sleepy.  You develop vomiting or diarrhea.  You have a general ill feeling (malaise) with muscle aches and pains. MAKE SURE YOU:   Understand these instructions.  Will watch your condition.  Will get help right away if you are not doing well or get worse. Document Released: 10/05/2004 Document Revised: 05/12/2013 Document Reviewed: 03/13/2011 [facility] Patient Information 2015 [place], [place]. This information is not intended to replace advice given to you by your health care provider. Make sure you discuss any questions you have with your health care provider.

## 2014-03-11 ENCOUNTER — Ambulatory Visit (INDEPENDENT_AMBULATORY_CARE_PROVIDER_SITE_OTHER): Payer: BLUE CROSS/BLUE SHIELD

## 2014-03-11 ENCOUNTER — Ambulatory Visit (INDEPENDENT_AMBULATORY_CARE_PROVIDER_SITE_OTHER): Payer: BLUE CROSS/BLUE SHIELD | Admitting: Family Medicine

## 2014-03-11 ENCOUNTER — Encounter: Payer: Self-pay | Admitting: Family Medicine

## 2014-03-11 ENCOUNTER — Other Ambulatory Visit: Payer: Self-pay | Admitting: Family Medicine

## 2014-03-11 VITALS — BP 120/72 | HR 99 | Temp 97.9°F | Resp 16 | Ht 65.0 in | Wt 268.0 lb

## 2014-03-11 DIAGNOSIS — M533 Sacrococcygeal disorders, not elsewhere classified: Secondary | ICD-10-CM

## 2014-03-11 DIAGNOSIS — E240 Pituitary-dependent Cushing's disease: Secondary | ICD-10-CM | POA: Diagnosis not present

## 2014-03-11 DIAGNOSIS — E2740 Unspecified adrenocortical insufficiency: Secondary | ICD-10-CM

## 2014-03-11 DIAGNOSIS — E1165 Type 2 diabetes mellitus with hyperglycemia: Secondary | ICD-10-CM | POA: Diagnosis not present

## 2014-03-11 DIAGNOSIS — L03115 Cellulitis of right lower limb: Secondary | ICD-10-CM | POA: Diagnosis not present

## 2014-03-11 DIAGNOSIS — M25561 Pain in right knee: Secondary | ICD-10-CM | POA: Diagnosis not present

## 2014-03-11 DIAGNOSIS — IMO0002 Reserved for concepts with insufficient information to code with codable children: Secondary | ICD-10-CM

## 2014-03-11 MED ORDER — DICLOFENAC SODIUM 1 % TD GEL
4.0000 g | Freq: Four times a day (QID) | TRANSDERMAL | Status: DC
Start: 2014-03-11 — End: 2015-02-03

## 2014-03-11 NOTE — Progress Notes (Signed)
Subjective:    Patient ID: Karl Pock, female    DOB: [birth year], 39 y.o.   MRN: [id number]  03/11/2014  Follow-up   HPI This 39 y.o. female presents for [place] from recent hospitalization for recurrent RLE cellulitis.  Here is the summary of discharge summary:    Date of Admission: 2/22/2016Date of Discharge: 03/04/2014 Admitting Physician: Blane Ohara McDiarmid, MD  Primary Care Provider: Reginia Forts, MD Consultants: None  Indication for Hospitalization: Cellulitis  Discharge Diagnoses/Problem List:   RLE Cellulitis - recurrent Asthma Cushing's Disease (s/p Pituitary resection) DMII with polyneuropathy HLD Endometrial Cancer HTN PCOS Sinus Tachycardia  Disposition: Home  Discharge Condition: Stable  Discharge Exam:  General: NAD, AAox3 Cardiovascular: RRR, No MGR, Normal S1/S2, 2+ distal pulses.  Respiratory: CTA Bilaterally, no crackles, rales. Unlabored, appropriate rate.  Abdomen: S, NT, ND +Bs Extremities: Significant retreat from marked borders, almost no redness visible. Foot completely without erythema. No purulence. TTP much improved from yesterday. 1+ peripheral edema bilaterally. WWP Neuro: No focal deficits.   Brief Hospital Course:  Salia Cangemi is a 39 y.o. female presenting with Recurrent RLE cellulits. PMH is significant for asthma, right lower leg cellulitis, Cushing's disease, diabetes type 2 with polyneuropathy, HLD, endometrial cancer, hypertension, polycystic ovary, sinus tachycardia.  Right lower extremity cellulitis: Patient was admitted with recurrence of her right lower extremity cellulitis that has occurred a few times over the last few years. Her last admission, a few months ago, resulted in sepsis and ICU admission, partially secondary to her adrenal insufficiency. In the ED workup was significant for mildly elevated WBC, she has been afebrile since admission. She ws started on Vancomycin and Zosyn  due to concern over recurrence and prior history of sepsis and soft blood pressures in the setting of Secondary Adrenal Insufficiency. Area of cellulitis was outlined. She showed rapid improvement in erythema and pain with IV antibiotics by day 2, and she tolerated switch to PO clindamycin. She tolerated this for one more day in the hospital, and was found to be safe for discharge home with close follow up with her PCP.   Cushing's Disease - Pt. With history of cushing's disease s/p Pituitary tumor resection 11/2013. She has secondary adrenal insufficiency as a result. She has previously required stress dose steroids in the setting of infection, and we also did that at this hospitalization. She was placed on Solu-Cortef during her admission which was tapered over 2 days. She did not have any hypotension, and tolerated this treatment well. Due to the short course and rapid improvement, she was found to be safe for discharge on her home regimen of Prednisone 7mg  daily at discharge with close follow up with her PCP. AM Cortisol here - 5  DM II - Pt. With DMII on extensive regimen at home. Continued here except for the Metformin. A1C this admission was 8.8. She did maintain CBG's in the 100's to 200's despite stress dose steroids on her home regimen. Otherwise, no acute changes in her therapy, and we continued her home regimen at discharge.   HTN - Continued on HCTZ / Lisinopril - BP's normotensive here.   Tachycardia - Known history of baseline tachycardia. No BB's at this time. HR 90's - 100 here.   Depression / Anxiety - Pt. On Ativan 0.5mg  BID prn, and Prozac 20mg  daily. Continued here. Ambien added for sleep which the patient tolerated very well.   Issues for Follow Up:  1. Follow up improvement of cellulitis and antibiotic compliance 2.  Follow up outpatient steroid tolerance 3. Follow up Glucose control.  Significant Procedures: None  Significant Labs and Imaging:   Last Labs      Recent  Labs Lab 03/02/14 1955 03/03/14 0530 03/04/14 0620  WBC 10.6* 8.1 7.8  HGB 11.0* 11.1* 10.3*  HCT 32.7* 34.5* 31.8*  PLT 292 283 299      Last Labs      Recent Labs Lab 03/02/14 1045 03/02/14 1955 03/03/14 0530 03/04/14 0620  NA 131* --  138 142  K 4.3 --  4.3 4.0  CL 101 --  105 107  CO2 26 --  27 28  GLUCOSE 223* --  214* 203*  BUN 12 --  11 9  CREATININE 0.73 0.61 0.65 0.64  CALCIUM 9.3 --  9.5 9.7  ALKPHOS 62 --  55 --   AST 34 --  23 --   ALT 32 --  34 --   ALBUMIN 3.9 --  3.6 --      AM Cortisol - 5 Lactic Acid 3 > 1.8   Results/Tests Pending at Time of Discharge: None  Discharge Medications:    Medication List    TAKE these medications       atorvastatin 10 MG tablet  Commonly known as: LIPITOR  Take 1 tablet (10 mg total) by mouth daily at 6 PM.     BAYER CONTOUR TEST test strip  Generic drug: glucose blood     clindamycin 150 MG capsule  Commonly known as: CLEOCIN  Take 3 capsules (450 mg total) by mouth every 6 (six) hours.     FLUoxetine 20 MG tablet  Commonly known as: PROZAC  Take 1 tablet (20 mg total) by mouth daily.     gabapentin 300 MG capsule  Commonly known as: NEURONTIN  Take 900 mg by mouth at bedtime.     HYDROcodone-acetaminophen 5-325 MG per tablet  Commonly known as: NORCO/VICODIN  Take 1-2 tablets by mouth every 6 (six) hours as needed for moderate pain.     hydrocortisone 2.5 % ointment  Apply topically 2 (two) times daily.     hydrocortisone 25 MG suppository  Commonly known as: ANUSOL-HC  Place 1 suppository (25 mg total) rectally 2 (two) times daily.     labetalol 200 MG tablet  Commonly known as: NORMODYNE  Take 1 tablet (200 mg total) by mouth 2 (two) times daily.     LANTUS SOLOSTAR 100 UNIT/ML Solostar Pen  Generic drug: Insulin Glargine    Inject 80 Units into the skin at bedtime.     lisinopril-hydrochlorothiazide 10-12.5 MG per tablet  Commonly known as: PRINZIDE,ZESTORETIC  Take 1 tablet by mouth daily.     LORazepam 0.5 MG tablet  Commonly known as: ATIVAN  Take 1 tablet (0.5 mg total) by mouth 2 (two) times daily as needed for anxiety.     metformin 500 MG (OSM) 24 hr tablet  Commonly known as: FORTAMET  Take 1,000 mg by mouth 2 (two) times daily with a meal.     multivitamin capsule  Take 1 capsule by mouth 2 (two) times daily.     NOVOLOG FLEXPEN 100 UNIT/ML FlexPen  Generic drug: insulin aspart  Inject 22 units with a correction 1:50 > 150 three times daily before meals     Omega-3 1000 MG Caps  Take 3 g by mouth.     ondansetron 8 MG tablet  Commonly known as: ZOFRAN  Take 8 mg by mouth every 8 (eight) hours as  needed for nausea or vomiting.     OVER THE COUNTER MEDICATION  Take 1 packet by mouth 2 (two) times daily. Nutralite multi Vitamins     OVER THE COUNTER MEDICATION  - Take 1 tablet by mouth 2 (two) times daily. Glucose Health  - Cromium Piccolate     predniSONE 1 MG tablet  Commonly known as: DELTASONE  Take 7 mg by mouth daily.     albuterol (2.5 MG/3ML) 0.083% nebulizer solution  Commonly known as: PROVENTIL  Take 2.5 mg by nebulization every 6 (six) hours as needed for wheezing or shortness of breath.     PROAIR HFA 108 (90 BASE) MCG/ACT inhaler  Generic drug: albuterol  Inhale 2 puffs into the lungs every 4 (four) hours as needed for shortness of breath.     PROBIOTIC DAILY PO  Take 1 tablet by mouth daily.     promethazine 25 MG tablet  Commonly known as: PHENERGAN  Take 12.5 mg by mouth every 6 (six) hours as needed for nausea or vomiting.     rOPINIRole 0.25 MG tablet  Commonly known as: REQUIP  Take 1 tablet (0.25 mg total) by mouth at bedtime.     silver sulfADIAZINE 1 % cream   Commonly known as: SILVADENE  Apply 1 application topically daily. Apply dime size thickness to affected area daily     traMADol 50 MG tablet  Commonly known as: ULTRAM  Take 1 tablet (50 mg total) by mouth every 6 (six) hours as needed.     VICTOZA 18 MG/3ML Sopn  Generic drug: Liraglutide  Inject 1.8 mg into the skin daily.     Vitamin D2 2000 UNITS Tabs  Take 1 tablet by mouth.     ZANTAC 150 MG tablet  Generic drug: ranitidine  Take 150 mg by mouth 2 (two) times daily.        Discharge Instructions: Please refer to Patient Instructions section of EMR for full details. Patient was counseled important signs and symptoms that should prompt return to medical care, changes in medications, dietary instructions, activity restrictions, and follow up appointments.   Follow-Up Appointments: Follow-up Information    Follow up with Camilah Spillman, MD. Schedule an appointment as soon as possible for a visit in 1 week.   Specialty: Family Medicine   Why: Hospital Follow Up    Contact information:   [place] [address] [phone]            RLE cellulitis:  Started last Tuesday while in shower along first toe.  Noticed redness of first toe during shower; suffering with burning and pain in first toe.  Presented to ED that day; admitted for 48 hours; received iv abx with rapid improvement.  Transitioned to oral antibiotics after 24 hours.  Returned to work the next day after discharge.  No fever/chills/sweats.  R leg felt really hot last night.  At end of the day each day, the anterior shin gets blood red.  Redness improves with elevation.  Not wearing compression stockings; fitted with compression stockings by podiatrist in 03/2013.  Legs swell regardless of compression stockings.  Dr. McDiarmid recommended lymphadema therapy via occupational therapy.  Sugars are starting to improve.  Dr. McDiarmid recommended considering a  prophylactic abx or self starting abx if starts to develop cellulitis.    Cushings syndrome:  Endocrinologist has decreased Prednisone to 5mg  every morning and 2 mg every afternoon.  Did try to wean to 5mg  over past four days but  felt poorly; has returned to 7mg  total per day.  The constant fatigue/tired.  Feels best on 8 or 10mg  daily.   Has a sick/stress protocol per endocrinologist.   Placed on 7mg  one month ago.  Saw endocrinology on 02/27/14 and remained on 7mg  daily.  Extra activities really fatigue patient.  Patient really wants to increase to 8mg  daily because feeling poorly currently.     R knee pain: having constant R knee pain.  Had suggestions of fibromyalgia, nerve inflammation.  Pain worsens with weight bearing.  Lives on third floor; taking steps one at a time going down.  No xray of leg.  Onset one month ago.  No injury.    Coccyx pain: having horrible tailbone pain.  Making careful movements.  Hesitates before getting up.  Sits down slowly.  Has been taking Tramadol every six hours.  Taking some hydrocodone.  Seeing chiropractor.     Review of Systems  Constitutional: Negative for fever, chills, diaphoresis and fatigue.  Eyes: Negative for visual disturbance.  Respiratory: Negative for cough and shortness of breath.   Cardiovascular: Positive for leg swelling. Negative for chest pain and palpitations.  Gastrointestinal: Negative for nausea, vomiting, abdominal pain, diarrhea and constipation.  Endocrine: Negative for cold intolerance, heat intolerance, polydipsia, polyphagia and polyuria.  Musculoskeletal: Positive for back pain, arthralgias and gait problem. Negative for myalgias, joint swelling, neck pain and neck stiffness.  Neurological: Negative for dizziness, tremors, seizures, syncope, facial asymmetry, speech difficulty, weakness, light-headedness, numbness and headaches.    Past Medical History  Diagnosis Date  . Cellulitis of lower leg     x3 on right leg  .  Hypertension   . Irregular menstrual bleeding   . PCOS (polycystic ovarian syndrome)   . Tachycardia   . Abnormal Pap smear 2000  . Endometrial ca 12/20/2011  . Asthma     no issues in years  . Family history of anesthesia complication     sister had hard time waking up and problems breathing  . Cushing's syndrome 09/25/2013    MRI done on 10/02/2013  . 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 Diabetes mellitus   . Anxiety   . Neuromuscular disorder     DIABETIC NEUROPATHY   Past Surgical History  Procedure Laterality Date  . Hysteroscopy w/d&c  01/24/2012    Procedure: DILATATION AND CURETTAGE /HYSTEROSCOPY;  Surgeon: Betsy Coder, MD;  Location: [facility];  Service: Gynecology;  Laterality: N/A;  with vulvar biopsies   . Intrauterine device (iud) insertion  01/24/2012    Procedure: INTRAUTERINE DEVICE (IUD) INSERTION;  Surgeon: Betsy Coder, MD;  Location: [facility];  Service: Gynecology;  Laterality: N/A;  . Dilation and curettage of uterus    . Robotic assisted total hysterectomy with bilateral salpingo oopherectomy Bilateral 07/02/2012    Procedure: ROBOTIC ASSISTED TOTAL HYSTERECTOMY WITH BILATERAL SALPINGO OOPHORECTOMY ;  Surgeon: Imagene Gurney A. Alycia Rossetti, MD;  Location: [facility];  Service: Gynecology;  Laterality: Bilateral;  . Abdominal hysterectomy    . Transsphenoid hypophysectomy  11/26/2013    ACTH secreting pituitary lesion causing Cushing's; [facility]; Angelene Giovanni, MD   Allergies  Allergen Reactions  . Antibacterial Hand Soap [Triclosan] Other (See Comments) and Rash    Dry, itchy patches on skin   Current Outpatient Prescriptions  Medication Sig Dispense Refill  . albuterol (PROVENTIL) (2.5 MG/3ML) 0.083% nebulizer solution Take 2.5 mg by nebulization every 6 (six) hours as needed for  wheezing or shortness of breath.    Marland Kitchen BAYER CONTOUR TEST test strip   98  . clindamycin (CLEOCIN) 150 MG capsule Take 3  capsules (450 mg total) by mouth every 6 (six) hours. 120 capsule 0  . Ergocalciferol (VITAMIN D2) 2000 UNITS TABS Take 1 tablet by mouth.    Marland Kitchen FLUoxetine (PROZAC) 20 MG tablet Take 1 tablet (20 mg total) by mouth daily. 30 tablet 5  . gabapentin (NEURONTIN) 300 MG capsule Take 900 mg by mouth at bedtime.    Marland Kitchen HYDROcodone-acetaminophen (NORCO/VICODIN) 5-325 MG per tablet Take 1-2 tablets by mouth every 6 (six) hours as needed for moderate pain. 40 tablet 0  . hydrocortisone (ANUSOL-HC) 25 MG suppository Place 1 suppository (25 mg total) rectally 2 (two) times daily. (Patient taking differently: Place 25 mg rectally daily as needed for hemorrhoids. ) 12 suppository 3  . hydrocortisone 2.5 % ointment Apply topically 2 (two) times daily. (Patient taking differently: Apply 1 application topically daily as needed (inflammation, itching). ) 30 g 1  . insulin aspart (NOVOLOG FLEXPEN) 100 UNIT/ML FlexPen Inject 22 units with a correction 1:50 > 150 three times daily before meals    . Insulin Glargine (LANTUS SOLOSTAR) 100 UNIT/ML Solostar Pen Inject 80 Units into the skin at bedtime.     Marland Kitchen labetalol (NORMODYNE) 200 MG tablet Take 1 tablet (200 mg total) by mouth 2 (two) times daily. 60 tablet 3  . Liraglutide (VICTOZA) 18 MG/3ML SOPN Inject 1.8 mg into the skin daily.     Marland Kitchen lisinopril-hydrochlorothiazide (PRINZIDE,ZESTORETIC) 10-12.5 MG per tablet Take 1 tablet by mouth daily.  10  . LORazepam (ATIVAN) 0.5 MG tablet Take 1 tablet (0.5 mg total) by mouth 2 (two) times daily as needed for anxiety. (Patient taking differently: Take 0.5 mg by mouth daily. ) 45 tablet 2  . metformin (FORTAMET) 500 MG (OSM) 24 hr tablet Take 1,000 mg by mouth 2 (two) times daily with a meal.     . Multiple Vitamin (MULTIVITAMIN) capsule Take 1 capsule by mouth 2 (two) times daily.     . Omega-3 1000 MG CAPS Take 3 g by mouth.    . ondansetron (ZOFRAN) 8 MG tablet Take 8 mg by mouth every 8 (eight) hours as needed for nausea or  vomiting.     Marland Kitchen OVER THE COUNTER MEDICATION Take 1 packet by mouth 2 (two) times daily. Nutralite multi  Vitamins    . OVER THE COUNTER MEDICATION Take 1 tablet by mouth 2 (two) times daily. Glucose Health Cromium Piccolate    . predniSONE (DELTASONE) 1 MG tablet Take 7 mg by mouth daily.    Marland Kitchen PROAIR HFA 108 (90 BASE) MCG/ACT inhaler Inhale 2 puffs into the lungs every 4 (four) hours as needed for shortness of breath.     . Probiotic Product (PROBIOTIC DAILY PO) Take 1 tablet by mouth daily.     . promethazine (PHENERGAN) 25 MG tablet Take 12.5 mg by mouth every 6 (six) hours as needed for nausea or vomiting.     . ranitidine (ZANTAC) 150 MG tablet Take 150 mg by mouth 2 (two) times daily.     Marland Kitchen rOPINIRole (REQUIP) 0.25 MG tablet Take 1 tablet (0.25 mg total) by mouth at bedtime. 30 tablet 2  . traMADol (ULTRAM) 50 MG tablet Take 1 tablet (50 mg total) by mouth every 6 (six) hours as needed. (Patient taking differently: Take 50 mg by mouth every 6 (six) hours as needed for moderate pain or  severe pain. ) 60 tablet 2  . atorvastatin (LIPITOR) 10 MG tablet Take 1 tablet (10 mg total) by mouth daily at 6 PM. (Patient not taking: Reported on 03/11/2014) 90 tablet 3  . diclofenac sodium (VOLTAREN) 1 % GEL Apply 4 g topically 4 (four) times daily. 100 g 2  . silver sulfADIAZINE (SILVADENE) 1 % cream Apply 1 application topically daily. Apply dime size thickness to affected area daily (Patient not taking: Reported on 02/11/2014) 50 g 0   No current facility-administered medications for this visit.       Objective:    BP 120/72 mmHg  Pulse 99  Temp(Src) 97.9 F (36.6 C) (Oral)  Resp 16  Ht 5\' 5"  (1.651 m)  Wt 268 lb (121.564 kg)  BMI 44.60 kg/m2  SpO2 97%  LMP 01/17/2012 Physical Exam  Constitutional: She is oriented to person, place, and time. She appears well-developed and well-nourished. No distress.  obese  HENT:  Head: Normocephalic and atraumatic.  Right Ear: External ear normal.  Left  Ear: External ear normal.  Nose: Nose normal.  Mouth/Throat: Oropharynx is clear and moist.  Eyes: Conjunctivae and EOM are normal. Pupils are equal, round, and reactive to light.  Neck: Normal range of motion. Neck supple. Carotid bruit is not present. No thyromegaly present.  Cardiovascular: Normal rate, regular rhythm, normal heart sounds and intact distal pulses.  Exam reveals no gallop and no friction rub.   No murmur heard. 1+ pitting and non-pitting edema BLE.    Pulmonary/Chest: Effort normal and breath sounds normal. She has no wheezes. She has no rales.  Abdominal: Soft. Bowel sounds are normal. She exhibits no distension and no mass. There is no tenderness. There is no rebound and no guarding.  Musculoskeletal:       Right knee: She exhibits normal range of motion, no swelling, no effusion, no bony tenderness and normal meniscus. No tenderness found. No medial joint line, no lateral joint line and no patellar tendon tenderness noted.       Lumbar back: She exhibits tenderness, bony tenderness and pain. She exhibits normal range of motion, no swelling, no spasm and normal pulse.  Lumbar spine:  Non-tender midline yet +TTP along distal coccyx region; non-tender paraspinal regions B.  Straight leg raises negative B; toe and heel walking intact; marching intact; motor 5/5 BLE.  Full ROM lumbar spine without limitation.  R KNEE: McMurray's negative; Lachman's negative; anterior drawer negative; v/v strain intact.  Lymphadenopathy:    She has no cervical adenopathy.  Neurological: She is alert and oriented to person, place, and time. No cranial nerve deficit.  Skin: Skin is warm and dry. No rash noted. She is not diaphoretic. No erythema. No pallor.  RLE without warmth, tenderness, or erythema.  No streaking.  Hyperpigmented area anterior shin that is chronic.  Psychiatric: She has a normal mood and affect. Her behavior is normal.   Results for orders placed or performed during the  hospital encounter of 03/02/14  Urine culture  Result Value Ref Range   Specimen Description URINE, CLEAN CATCH    Special Requests NONE    Colony Count NO GROWTH Performed at [facility]     Culture NO GROWTH Performed at [facility]     Report Status 03/03/2014 FINAL   Comprehensive metabolic panel  Result Value Ref Range   Sodium 131 (L) 135 - 145 mmol/L   Potassium 4.3 3.5 - 5.1 mmol/L   Chloride 101 96 - 112 mmol/L  CO2 26 19 - 32 mmol/L   Glucose, Bld 223 (H) 70 - 99 mg/dL   BUN 12 6 - 23 mg/dL   Creatinine, Ser 0.73 0.50 - 1.10 mg/dL   Calcium 9.3 8.4 - 10.5 mg/dL   Total Protein 7.1 6.0 - 8.3 g/dL   Albumin 3.9 3.5 - 5.2 g/dL   AST 34 0 - 37 U/L   ALT 32 0 - 35 U/L   Alkaline Phosphatase 62 39 - 117 U/L   Total Bilirubin 0.4 0.3 - 1.2 mg/dL   GFR calc non Af Amer >90 >90 mL/min   GFR calc Af Amer >90 >90 mL/min   Anion gap 4 (L) 5 - 15  CBC with Differential  Result Value Ref Range   WBC 16.2 (H) 4.0 - 10.5 K/uL   RBC 3.54 (L) 3.87 - 5.11 MIL/uL   Hemoglobin 11.4 (L) 12.0 - 15.0 g/dL   HCT 34.5 (L) 36.0 - 46.0 %   MCV 97.5 78.0 - 100.0 fL   MCH 32.2 26.0 - 34.0 pg   MCHC 33.0 30.0 - 36.0 g/dL   RDW 12.2 11.5 - 15.5 %   Platelets 307 150 - 400 K/uL   Neutrophils Relative % 83 (H) 43 - 77 %   Neutro Abs 13.5 (H) 1.7 - 7.7 K/uL   Lymphocytes Relative 6 (L) 12 - 46 %   Lymphs Abs 1.0 0.7 - 4.0 K/uL   Monocytes Relative 7 3 - 12 %   Monocytes Absolute 1.1 (H) 0.1 - 1.0 K/uL   Eosinophils Relative 3 0 - 5 %   Eosinophils Absolute 0.5 0.0 - 0.7 K/uL   Basophils Relative 1 0 - 1 %   Basophils Absolute 0.1 0.0 - 0.1 K/uL  Urinalysis, Routine w reflex microscopic  Result Value Ref Range   Color, Urine YELLOW YELLOW   APPearance CLEAR CLEAR   Specific Gravity, Urine 1.016 1.005 - 1.030   pH 5.5 5.0 - 8.0   Glucose, UA 250 (A) NEGATIVE mg/dL   Hgb urine dipstick NEGATIVE NEGATIVE   Bilirubin Urine NEGATIVE NEGATIVE   Ketones, ur NEGATIVE  NEGATIVE mg/dL   Protein, ur NEGATIVE NEGATIVE mg/dL   Urobilinogen, UA 0.2 0.0 - 1.0 mg/dL   Nitrite NEGATIVE NEGATIVE   Leukocytes, UA TRACE (A) NEGATIVE  Urine microscopic-add on  Result Value Ref Range   Squamous Epithelial / LPF RARE RARE   WBC, UA 0-2 <3 WBC/hpf   RBC / HPF 0-2 <3 RBC/hpf   Bacteria, UA RARE RARE   Casts HYALINE CASTS (A) NEGATIVE   Urine-Other MUCOUS PRESENT   Hemoglobin A1c  Result Value Ref Range   Hgb A1c MFr Bld 8.8 (H) 4.8 - 5.6 %   Mean Plasma Glucose 206 mg/dL  Glucose, capillary  Result Value Ref Range   Glucose-Capillary 220 (H) 70 - 99 mg/dL  CBC  Result Value Ref Range   WBC 10.6 (H) 4.0 - 10.5 K/uL   RBC 3.46 (L) 3.87 - 5.11 MIL/uL   Hemoglobin 11.0 (L) 12.0 - 15.0 g/dL   HCT 32.7 (L) 36.0 - 46.0 %   MCV 94.5 78.0 - 100.0 fL   MCH 31.8 26.0 - 34.0 pg   MCHC 33.6 30.0 - 36.0 g/dL   RDW 12.7 11.5 - 15.5 %   Platelets 292 150 - 400 K/uL  Creatinine, serum  Result Value Ref Range   Creatinine, Ser 0.61 0.50 - 1.10 mg/dL   GFR calc non Af Amer >90 >90 mL/min  GFR calc Af Amer >90 >90 mL/min  Comprehensive metabolic panel  Result Value Ref Range   Sodium 138 135 - 145 mmol/L   Potassium 4.3 3.5 - 5.1 mmol/L   Chloride 105 96 - 112 mmol/L   CO2 27 19 - 32 mmol/L   Glucose, Bld 214 (H) 70 - 99 mg/dL   BUN 11 6 - 23 mg/dL   Creatinine, Ser 0.65 0.50 - 1.10 mg/dL   Calcium 9.5 8.4 - 10.5 mg/dL   Total Protein 6.4 6.0 - 8.3 g/dL   Albumin 3.6 3.5 - 5.2 g/dL   AST 23 0 - 37 U/L   ALT 34 0 - 35 U/L   Alkaline Phosphatase 55 39 - 117 U/L   Total Bilirubin 0.4 0.3 - 1.2 mg/dL   GFR calc non Af Amer >90 >90 mL/min   GFR calc Af Amer >90 >90 mL/min   Anion gap 6 5 - 15  CBC  Result Value Ref Range   WBC 8.1 4.0 - 10.5 K/uL   RBC 3.56 (L) 3.87 - 5.11 MIL/uL   Hemoglobin 11.1 (L) 12.0 - 15.0 g/dL   HCT 34.5 (L) 36.0 - 46.0 %   MCV 96.9 78.0 - 100.0 fL   MCH 31.2 26.0 - 34.0 pg   MCHC 32.2 30.0 - 36.0 g/dL   RDW 12.6 11.5 - 15.5 %    Platelets 283 150 - 400 K/uL  Protime-INR  Result Value Ref Range   Prothrombin Time 13.9 11.6 - 15.2 seconds   INR 1.06 0.00 - 1.49  APTT  Result Value Ref Range   aPTT 29 24 - 37 seconds  Lactic acid, plasma  Result Value Ref Range   Lactic Acid, Venous 1.8 0.5 - 2.0 mmol/L  Cortisol-am, blood  Result Value Ref Range   Cortisol - AM 5.0 4.3 - 22.4 ug/dL  Glucose, capillary  Result Value Ref Range   Glucose-Capillary 186 (H) 70 - 99 mg/dL  Glucose, capillary  Result Value Ref Range   Glucose-Capillary 198 (H) 70 - 99 mg/dL  Glucose, capillary  Result Value Ref Range   Glucose-Capillary 126 (H) 70 - 99 mg/dL  Basic metabolic panel  Result Value Ref Range   Sodium 142 135 - 145 mmol/L   Potassium 4.0 3.5 - 5.1 mmol/L   Chloride 107 96 - 112 mmol/L   CO2 28 19 - 32 mmol/L   Glucose, Bld 203 (H) 70 - 99 mg/dL   BUN 9 6 - 23 mg/dL   Creatinine, Ser 0.64 0.50 - 1.10 mg/dL   Calcium 9.7 8.4 - 10.5 mg/dL   GFR calc non Af Amer >90 >90 mL/min   GFR calc Af Amer >90 >90 mL/min   Anion gap 7 5 - 15  CBC with Differential/Platelet  Result Value Ref Range   WBC 7.8 4.0 - 10.5 K/uL   RBC 3.33 (L) 3.87 - 5.11 MIL/uL   Hemoglobin 10.3 (L) 12.0 - 15.0 g/dL   HCT 31.8 (L) 36.0 - 46.0 %   MCV 95.5 78.0 - 100.0 fL   MCH 30.9 26.0 - 34.0 pg   MCHC 32.4 30.0 - 36.0 g/dL   RDW 12.7 11.5 - 15.5 %   Platelets 299 150 - 400 K/uL   Neutrophils Relative % 54 43 - 77 %   Neutro Abs 4.2 1.7 - 7.7 K/uL   Lymphocytes Relative 34 12 - 46 %   Lymphs Abs 2.7 0.7 - 4.0 K/uL   Monocytes Relative 8 3 -  12 %   Monocytes Absolute 0.6 0.1 - 1.0 K/uL   Eosinophils Relative 3 0 - 5 %   Eosinophils Absolute 0.2 0.0 - 0.7 K/uL   Basophils Relative 1 0 - 1 %   Basophils Absolute 0.1 0.0 - 0.1 K/uL  Glucose, capillary  Result Value Ref Range   Glucose-Capillary 222 (H) 70 - 99 mg/dL  Glucose, capillary  Result Value Ref Range   Glucose-Capillary 154 (H) 70 - 99 mg/dL  Glucose, capillary  Result  Value Ref Range   Glucose-Capillary 202 (H) 70 - 99 mg/dL   Comment 1 Documented in Char   Glucose, capillary  Result Value Ref Range   Glucose-Capillary 185 (H) 70 - 99 mg/dL   Comment 1 Documented in Char   Glucose, capillary  Result Value Ref Range   Glucose-Capillary 150 (H) 70 - 99 mg/dL   Comment 1 Documented in Char   I-Stat CG4 Lactic Acid, ED  Result Value Ref Range   Lactic Acid, Venous 3.71 (HH) 0.5 - 2.0 mmol/L   Comment NOTIFIED PHYSICIAN   I-Stat CG4 Lactic Acid, ED (not at [facility])  Result Value Ref Range   Lactic Acid, Venous 3.02 (HH) 0.5 - 2.0 mmol/L   Comment NOTIFIED PHYSICIAN    [facility] reading (PRIMARY) by  Dr. Tamala Julian.  R KNEE: NAD;  COCCYX:  NAD.      Assessment & Plan:   1. Cellulitis of right lower extremity   2. Adrenal insufficiency   3. Pituitary Cushing's syndrome   4. Type II diabetes mellitus, uncontrolled   5. Recurrent knee pain, right   6. Coccyx pain     1. Recurrent RLE cellulitis:  Recurrent requiring hospitalization again; refer to ID due to recurrent nature of cellulitis in setting of adrenal insufficiency s/p recent pituitary surgery for Cushing's syndrome. 2. Adrenal insufficiency: Persistent; encourage patient to defer Prednisone management to endocrinology; to contact endocrinology today regarding current symptoms and recent hospitalization for cellulitis. Discourage self adjustment of Prednisone. 3.  Cushing's syndrome: stable; s/p pituitary surgical resection; followed closely by endocrinology. 4.  DMII: uncontrolled yet improving.  Managed per endocrinology. 5.  Recurrent R knee pain:  Persistent; suffering with arthralgias since pituitary tumor resection as Prednisone is being weaned however suffering with very specific knee pain.  Mild OA on xray.  Recommend rest, icing qhs, Voltaren gel qid PRN.  If no improvement, refer to ortho yet do not recommend steroid injection or surgical correction of any internal pathology for the next six  months.  Could undergo MRI to determine pathology.  6.  Coccyx pain: New.  No trauma; mild degenerative changes present; recommend rest, donut pillow for sitting, and frequent position changes. Continue Tramadol and hydrocodone PRN; advised to use hydrocodone at night.   Meds ordered this encounter  Medications  . diclofenac sodium (VOLTAREN) 1 % GEL    Sig: Apply 4 g topically 4 (four) times daily.    Dispense:  100 g    Refill:  2    No Follow-up on file.     Cora Brierley Elayne Guerin, M.D. [facility] [address] [place], [place]  [address] [phone] phone [phone] fax

## 2014-03-12 ENCOUNTER — Encounter: Payer: Self-pay | Admitting: Family Medicine

## 2014-03-16 ENCOUNTER — Telehealth: Payer: Self-pay | Admitting: Family Medicine

## 2014-03-16 NOTE — Telephone Encounter (Signed)
PA approved. Faxed approval to her pharmacy. Left a voicemail letting pt know.

## 2014-03-16 NOTE — Telephone Encounter (Signed)
Patient states that her pharmacy sent an electronic PA request on Wed and Friday last week. I do not see anything in EPIC however patient states that she needs PA for a pain medication for her knee. She does not know the name of the med.  [phone] Patient uses [facility] on [facility]

## 2014-03-26 ENCOUNTER — Encounter (HOSPITAL_COMMUNITY): Payer: Self-pay | Admitting: Emergency Medicine

## 2014-03-26 ENCOUNTER — Emergency Department (HOSPITAL_COMMUNITY)
Admission: EM | Admit: 2014-03-26 | Discharge: 2014-03-27 | Disposition: A | Discharge status: Home / Self Care | Payer: BLUE CROSS/BLUE SHIELD | Attending: Emergency Medicine | Admitting: Emergency Medicine

## 2014-03-26 ENCOUNTER — Telehealth: Payer: Self-pay | Admitting: Family Medicine

## 2014-03-26 DIAGNOSIS — Z794 Long term (current) use of insulin: Secondary | ICD-10-CM | POA: Insufficient documentation

## 2014-03-26 DIAGNOSIS — J45909 Unspecified asthma, uncomplicated: Secondary | ICD-10-CM | POA: Diagnosis not present

## 2014-03-26 DIAGNOSIS — Z7952 Long term (current) use of systemic steroids: Secondary | ICD-10-CM | POA: Insufficient documentation

## 2014-03-26 DIAGNOSIS — Z791 Long term (current) use of non-steroidal anti-inflammatories (NSAID): Secondary | ICD-10-CM | POA: Diagnosis not present

## 2014-03-26 DIAGNOSIS — I10 Essential (primary) hypertension: Secondary | ICD-10-CM | POA: Insufficient documentation

## 2014-03-26 DIAGNOSIS — Z8542 Personal history of malignant neoplasm of other parts of uterus: Secondary | ICD-10-CM | POA: Diagnosis not present

## 2014-03-26 DIAGNOSIS — Z8669 Personal history of other diseases of the nervous system and sense organs: Secondary | ICD-10-CM | POA: Insufficient documentation

## 2014-03-26 DIAGNOSIS — F419 Anxiety disorder, unspecified: Secondary | ICD-10-CM | POA: Diagnosis not present

## 2014-03-26 DIAGNOSIS — Z872 Personal history of diseases of the skin and subcutaneous tissue: Secondary | ICD-10-CM | POA: Diagnosis not present

## 2014-03-26 DIAGNOSIS — M25561 Pain in right knee: Secondary | ICD-10-CM | POA: Diagnosis present

## 2014-03-26 DIAGNOSIS — Z79899 Other long term (current) drug therapy: Secondary | ICD-10-CM | POA: Insufficient documentation

## 2014-03-26 DIAGNOSIS — Z8742 Personal history of other diseases of the female genital tract: Secondary | ICD-10-CM | POA: Insufficient documentation

## 2014-03-26 DIAGNOSIS — E119 Type 2 diabetes mellitus without complications: Secondary | ICD-10-CM | POA: Insufficient documentation

## 2014-03-26 DIAGNOSIS — Z8639 Personal history of other endocrine, nutritional and metabolic disease: Secondary | ICD-10-CM | POA: Diagnosis not present

## 2014-03-26 MED ORDER — HYDROMORPHONE HCL 2 MG/ML IJ SOLN
2.0000 mg | Freq: Once | INTRAMUSCULAR | Status: AC
  Administered 2014-03-26: 2 mg via INTRAMUSCULAR
  Filled 2014-03-26: qty 1

## 2014-03-26 MED ORDER — ONDANSETRON 4 MG PO TBDP
4.0000 mg | ORAL_TABLET | Freq: Once | ORAL | Status: AC
  Administered 2014-03-26: 4 mg via ORAL
  Filled 2014-03-26: qty 1

## 2014-03-26 MED ORDER — OXYCODONE-ACETAMINOPHEN 5-325 MG PO TABS
2.0000 | ORAL_TABLET | Freq: Once | ORAL | Status: AC
  Administered 2014-03-26: 2 via ORAL
  Filled 2014-03-26: qty 2

## 2014-03-26 NOTE — ED Provider Notes (Signed)
CSN: [id number]     Arrival date & time 03/26/14  2029 History  This chart was scribed for non-physician practitioner, Alvina Chou, PA-C, working with Blanchie Dessert, MD, by Stephania Fragmin, ED Scribe. This patient was seen in room [place] and the patient's care was started at 10:08 PM.   The history is provided by the patient and a friend. No language interpreter was used.    HPI Comments: Kelli Allen is a 39 y.o. female who presents to the Emergency Department complaining of severe knee pain that began 2 months ago. She had been to her PCP, who had taken an XR that showed slight arthritis. She was also given Tramadol and Voltaren, which provided no relief.   Patient's PCP sent her to the ED because had stated she "wants to die" because she wants the pain to stop. However, patient denies SI or HI. Her friends have been in town for a week, and have said that they are "extremely worried" patient has not been behaving normally, especially because she normally lives alone. They state that she is usually not as agitated, and wonder whether her medications are causing adverse psychiatric side effects. They state that she started Prozac, Ativan, and Requip in December, and her knee pain started at around the time.   She states she has a history of pituitary tumorectomy and recurrent right leg cellulitis, for which she was last treated for in February. At one point, she was given very high doses of Prednisone every 4 hours to regulate her cortisol levels. She states that some morning, she is unable to get up in the morning because her cortisol levels are "through the floor" and is unable to get to work. Patient has tried hydrocodone and oxycodone from her hospitalizations/surgeries that were initially effective but became completely ineffective after some time. Nothing makes her pain better.  PCP: Reginia Forts, MD   Past Medical History  Diagnosis Date  . Cellulitis of lower leg     x3 on right leg   . Hypertension   . Irregular menstrual bleeding   . PCOS (polycystic ovarian syndrome)   . Tachycardia   . Abnormal Pap smear 2000  . Endometrial ca 12/20/2011  . Asthma     no issues in years  . Family history of anesthesia complication     sister had hard time waking up and problems breathing  . Cushing's syndrome 09/25/2013    MRI done on 10/02/2013  . 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 Diabetes mellitus   . Anxiety   . Neuromuscular disorder     DIABETIC NEUROPATHY   Past Surgical History  Procedure Laterality Date  . Hysteroscopy w/d&c  01/24/2012    Procedure: DILATATION AND CURETTAGE /HYSTEROSCOPY;  Surgeon: Betsy Coder, MD;  Location: [facility];  Service: Gynecology;  Laterality: N/A;  with vulvar biopsies   . Intrauterine device (iud) insertion  01/24/2012    Procedure: INTRAUTERINE DEVICE (IUD) INSERTION;  Surgeon: Betsy Coder, MD;  Location: [facility];  Service: Gynecology;  Laterality: N/A;  . Dilation and curettage of uterus    . Robotic assisted total hysterectomy with bilateral salpingo oopherectomy Bilateral 07/02/2012    Procedure: ROBOTIC ASSISTED TOTAL HYSTERECTOMY WITH BILATERAL SALPINGO OOPHORECTOMY ;  Surgeon: Imagene Gurney A. Alycia Rossetti, MD;  Location: [facility];  Service: Gynecology;  Laterality: Bilateral;  . Abdominal hysterectomy    . Transsphenoid hypophysectomy  11/26/2013    ACTH secreting pituitary lesion  causing Cushing's; [facility]; Angelene Giovanni, MD   Family History  Problem Relation Age of Onset  . Diabetes type II Mother   . Coronary artery disease Mother   . Diabetes Mother   . Heart disease Mother   . Hyperlipidemia Mother   . Hypertension Mother   . Diabetes type II Father   . Coronary artery disease Father   . Diabetes Father   . Heart disease Father   . Hyperlipidemia Father   . Heart disease Sister   . Diabetes Sister    History  Substance Use Topics  . Smoking  status: Never Smoker   . Smokeless tobacco: Never Used  . Alcohol Use: 0.0 oz/week    0 Standard drinks or equivalent per week     Comment: rare   OB History    Gravida Para Term Preterm AB TAB SAB Ectopic Multiple Living   0              Review of Systems  Musculoskeletal: Positive for arthralgias.  Psychiatric/Behavioral: Positive for agitation. Negative for suicidal ideas.  All other systems reviewed and are negative.   Allergies  Antibacterial hand soap  Home Medications   Prior to Admission medications   Medication Sig Start Date End Date Taking? Authorizing Provider  albuterol (PROVENTIL) (2.5 MG/3ML) 0.083% nebulizer solution Take 2.5 mg by nebulization every 6 (six) hours as needed for wheezing or shortness of breath.    Historical Provider, MD  atorvastatin (LIPITOR) 10 MG tablet Take 1 tablet (10 mg total) by mouth daily at 6 PM. Patient not taking: Reported on 03/11/2014 10/15/13   Wardell Honour, MD  BAYER CONTOUR TEST test strip  12/12/13   Historical Provider, MD  diclofenac sodium (VOLTAREN) 1 % GEL Apply 4 g topically 4 (four) times daily. 03/11/14   Wardell Honour, MD  Ergocalciferol (VITAMIN D2) 2000 UNITS TABS Take 1 tablet by mouth.    Historical Provider, MD  FLUoxetine (PROZAC) 20 MG tablet Take 1 tablet (20 mg total) by mouth daily. 01/08/14   Wardell Honour, MD  gabapentin (NEURONTIN) 300 MG capsule Take 900 mg by mouth at bedtime.    Historical Provider, MD  HYDROcodone-acetaminophen (NORCO/VICODIN) 5-325 MG per tablet Take 1-2 tablets by mouth every 6 (six) hours as needed for moderate pain. 03/04/14   Aquilla Hacker, MD  hydrocortisone (ANUSOL-HC) 25 MG suppository Place 1 suppository (25 mg total) rectally 2 (two) times daily. Patient taking differently: Place 25 mg rectally daily as needed for hemorrhoids.  02/13/14   Wardell Honour, MD  hydrocortisone 2.5 % ointment Apply topically 2 (two) times daily. Patient taking differently: Apply 1 application  topically daily as needed (inflammation, itching).  01/08/14   Wardell Honour, MD  insulin aspart (NOVOLOG FLEXPEN) 100 UNIT/ML FlexPen Inject 22 units with a correction 1:50 > 150 three times daily before meals 10/10/13   Historical Provider, MD  Insulin Glargine (LANTUS SOLOSTAR) 100 UNIT/ML Solostar Pen Inject 80 Units into the skin at bedtime.  09/25/13   Historical Provider, MD  labetalol (NORMODYNE) 200 MG tablet Take 1 tablet (200 mg total) by mouth 2 (two) times daily. 01/01/14   Tereasa Coop, PA-C  Liraglutide (VICTOZA) 18 MG/3ML SOPN Inject 1.8 mg into the skin daily.  10/17/13   Historical Provider, MD  lisinopril-hydrochlorothiazide (PRINZIDE,ZESTORETIC) 10-12.5 MG per tablet Take 1 tablet by mouth daily. 02/18/14   Historical Provider, MD  LORazepam (ATIVAN) 0.5 MG tablet Take 1 tablet (0.5  mg total) by mouth 2 (two) times daily as needed for anxiety. Patient taking differently: Take 0.5 mg by mouth daily.  01/08/14   Wardell Honour, MD  metformin (FORTAMET) 500 MG (OSM) 24 hr tablet Take 1,000 mg by mouth 2 (two) times daily with a meal.     Historical Provider, MD  Multiple Vitamin (MULTIVITAMIN) capsule Take 1 capsule by mouth 2 (two) times daily.     Historical Provider, MD  Omega-3 1000 MG CAPS Take 3 g by mouth.    Historical Provider, MD  ondansetron (ZOFRAN) 8 MG tablet Take 8 mg by mouth every 8 (eight) hours as needed for nausea or vomiting.     Historical Provider, MD  OVER THE COUNTER MEDICATION Take 1 packet by mouth 2 (two) times daily. Nutralite multi  Vitamins    Historical Provider, MD  OVER THE COUNTER MEDICATION Take 1 tablet by mouth 2 (two) times daily. Glucose Health Cromium Piccolate    Historical Provider, MD  predniSONE (DELTASONE) 1 MG tablet Take 7 mg by mouth daily. 02/11/14   Historical Provider, MD  PROAIR HFA 108 (90 BASE) MCG/ACT inhaler Inhale 2 puffs into the lungs every 4 (four) hours as needed for shortness of breath.  11/21/12   Historical Provider, MD   Probiotic Product (PROBIOTIC DAILY PO) Take 1 tablet by mouth daily.     Historical Provider, MD  promethazine (PHENERGAN) 25 MG tablet Take 12.5 mg by mouth every 6 (six) hours as needed for nausea or vomiting.     Historical Provider, MD  ranitidine (ZANTAC) 150 MG tablet Take 150 mg by mouth 2 (two) times daily.  11/29/13   Historical Provider, MD  rOPINIRole (REQUIP) 0.25 MG tablet Take 1 tablet (0.25 mg total) by mouth at bedtime. 01/08/14   Wardell Honour, MD  silver sulfADIAZINE (SILVADENE) 1 % cream Apply 1 application topically daily. Apply dime size thickness to affected area daily Patient not taking: Reported on 02/11/2014 09/03/13   Thao P Le, DO  traMADol (ULTRAM) 50 MG tablet Take 1 tablet (50 mg total) by mouth every 6 (six) hours as needed. Patient taking differently: Take 50 mg by mouth every 6 (six) hours as needed for moderate pain or severe pain.  02/11/14   Wardell Honour, MD   BP 139/88 mmHg  Pulse 103  Temp(Src) 98.3 F (36.8 C)  Resp 20  SpO2 96%  LMP 01/17/2012 Physical Exam  Constitutional: She is oriented to person, place, and time. She appears well-developed and well-nourished. No distress.  HENT:  Head: Normocephalic and atraumatic.  Eyes: Conjunctivae and EOM are normal.  Neck: Neck supple. No tracheal deviation present.  Cardiovascular: Normal rate and regular rhythm.  Exam reveals no gallop and no friction rub.   No murmur heard. Pulmonary/Chest: Effort normal and breath sounds normal. No respiratory distress. She has no wheezes. She has no rales. She exhibits no tenderness.  Abdominal: Soft. There is no tenderness.  Musculoskeletal:  Limited ROM of right knee due to pain. Generalized anterior right knee tenderness to palpation. No obvious deformity.   Neurological: She is alert and oriented to person, place, and time.  Speech is goal-oriented. Moves limbs without ataxia.   Skin: Skin is warm and dry.  Psychiatric: She has a normal mood and affect. Her  behavior is normal.  Nursing note and vitals reviewed.   ED Course  Procedures (including critical care time)  DIAGNOSTIC STUDIES: Oxygen Saturation is 96% on room air, normal by my  interpretation.    COORDINATION OF CARE: 10:21 PM - Discussed treatment plan with pt at bedside which includes an injection administered here, a prescription for pain medication, nausea medication, and a referral to an orthopedist, and pt agreed to plan.  Medications  HYDROmorphone (DILAUDID) injection 2 mg (not administered)  ondansetron (ZOFRAN-ODT) disintegrating tablet 4 mg (not administered)    11:25 PM - She denies any relief from her Dilaudid; however, she states that she no longer "cares" about her pain, as a side effect of the Dilaudid.  MDM   Final diagnoses:  Right knee pain   Pain improved with dilaudid and percocet. Patient will be referred to Orthopedics. No neurovascular compromise.   I personally performed the services described in this documentation, which was scribed in my presence. The recorded information has been reviewed and is accurate.     Alvina Chou, PA-C 03/27/14 0136  Blanchie Dessert, MD 03/28/14 1423

## 2014-03-26 NOTE — Telephone Encounter (Signed)
Kelli Allen, called reporting that patient is having a meltdown due to persistent daily pain.  Pt stating that she cannot take the pain anymore.  Having suicidal thoughts and comments.  A/P: Suicidal ideations: recommend ED evaluation immediately; friend to transport pt to ED.

## 2014-03-26 NOTE — ED Notes (Signed)
Kin of patient just came to this Probation officer and stated "the patient was sent here by their doctor for suicidal thoughts" notified RN Ramond Marrow

## 2014-03-26 NOTE — ED Notes (Signed)
Pt states she's been dealing with chronic rt knee pain from bad arthritis that has been flaring up more severely. The mood swings she's been having concerned her PCP because pt has been telling her she "wants to die" because she's in so much pain.

## 2014-03-27 MED ORDER — CYCLOBENZAPRINE HCL 10 MG PO TABS: 10.0000 mg | ORAL_TABLET | Freq: Two times a day (BID) | ORAL | Status: DC | PRN

## 2014-03-27 MED ORDER — OXYCODONE-ACETAMINOPHEN 5-325 MG PO TABS: 2.0000 | ORAL_TABLET | ORAL | Status: DC | PRN

## 2014-03-27 NOTE — Discharge Instructions (Signed)
Take percocet as needed for pain. Take Flexeril as needed for muscle spasm. Refer to attached documents for more information. Follow up with Dr. Percell Miller for further evaluation.

## 2014-04-01 ENCOUNTER — Ambulatory Visit (INDEPENDENT_AMBULATORY_CARE_PROVIDER_SITE_OTHER): Payer: BLUE CROSS/BLUE SHIELD | Admitting: Internal Medicine

## 2014-04-01 ENCOUNTER — Encounter: Payer: Self-pay | Admitting: Internal Medicine

## 2014-04-01 VITALS — BP 122/79 | HR 101 | Temp 98.3°F | Ht 65.0 in | Wt 272.0 lb

## 2014-04-01 DIAGNOSIS — L03119 Cellulitis of unspecified part of limb: Secondary | ICD-10-CM

## 2014-04-01 NOTE — Progress Notes (Signed)
Subjective:    Patient ID: Kelli Allen, female    DOB: Apr 21, 1975, 39 y.o.   MRN: [id number]  HPI  39yo F with hix of pituitary tumor causing cushing's syndrome s/p resection in late 2015, DM2 c/b diabetic neuropathy, obesity, venous stasis and recurrent cellulitis, with sepsis. Kelli Allen reports that she has had 6 episodes of cellulitis over the last 4 years, increasing in frequency. Her last episodes were this past September and February. She was hospitalized for both since it was associated with sepsis, per her report. She states that it always involves her right lower leg, and specifically always starts in the same spot, lateral mid calf.  She states that she has recovered from her last episode of cellulitis. Her legs do stay slightly edematous. She is referred to see if there are any interventions that can be done to minimize severity and frequency of cellulitis   Current Outpatient Prescriptions on File Prior to Visit  Medication Sig Dispense Refill  . albuterol (PROVENTIL) (2.5 MG/3ML) 0.083% nebulizer solution Take 2.5 mg by nebulization every 6 (six) hours as needed for wheezing or shortness of breath.    Marland Kitchen BAYER CONTOUR TEST test strip   98  . cyclobenzaprine (FLEXERIL) 10 MG tablet Take 1 tablet (10 mg total) by mouth 2 (two) times daily as needed for muscle spasms. 20 tablet 0  . diclofenac sodium (VOLTAREN) 1 % GEL Apply 4 g topically 4 (four) times daily. 100 g 2  . Ergocalciferol (VITAMIN D2) 2000 UNITS TABS Take 1 tablet by mouth.    Marland Kitchen FLUoxetine (PROZAC) 20 MG tablet Take 1 tablet (20 mg total) by mouth daily. 30 tablet 5  . gabapentin (NEURONTIN) 300 MG capsule Take 900 mg by mouth at bedtime.    . hydrocortisone (ANUSOL-HC) 25 MG suppository Place 1 suppository (25 mg total) rectally 2 (two) times daily. (Patient taking differently: Place 25 mg rectally daily as needed for hemorrhoids. ) 12 suppository 3  . hydrocortisone 2.5 % ointment Apply topically 2 (two) times daily.  (Patient taking differently: Apply 1 application topically daily as needed (inflammation, itching). ) 30 g 1  . insulin aspart (NOVOLOG FLEXPEN) 100 UNIT/ML FlexPen Inject 22 units with a correction 1:50 > 150 three times daily before meals    . Insulin Glargine (LANTUS SOLOSTAR) 100 UNIT/ML Solostar Pen Inject 80 Units into the skin at bedtime.     Marland Kitchen labetalol (NORMODYNE) 200 MG tablet Take 1 tablet (200 mg total) by mouth 2 (two) times daily. 60 tablet 3  . Liraglutide (VICTOZA) 18 MG/3ML SOPN Inject 1.8 mg into the skin daily.     Marland Kitchen lisinopril-hydrochlorothiazide (PRINZIDE,ZESTORETIC) 10-12.5 MG per tablet Take 1 tablet by mouth daily.  10  . LORazepam (ATIVAN) 0.5 MG tablet Take 1 tablet (0.5 mg total) by mouth 2 (two) times daily as needed for anxiety. (Patient taking differently: Take 0.5 mg by mouth daily. ) 45 tablet 2  . metformin (FORTAMET) 500 MG (OSM) 24 hr tablet Take 1,000 mg by mouth 2 (two) times daily with a meal.     . Multiple Vitamin (MULTIVITAMIN) capsule Take 1 capsule by mouth 2 (two) times daily.     . Omega-3 1000 MG CAPS Take 3 g by mouth.    . ondansetron (ZOFRAN) 8 MG tablet Take 8 mg by mouth every 8 (eight) hours as needed for nausea or vomiting.     Marland Kitchen OVER THE COUNTER MEDICATION Take 1 packet by mouth 2 (two) times daily. Nutralite multi  Vitamins    . OVER THE COUNTER MEDICATION Take 1 tablet by mouth 2 (two) times daily. Glucose Health Cromium Piccolate    . oxyCODONE-acetaminophen (PERCOCET/ROXICET) 5-325 MG per tablet Take 2 tablets by mouth every 4 (four) hours as needed for severe pain. 15 tablet 0  . predniSONE (DELTASONE) 1 MG tablet Take 7 mg by mouth daily.    Marland Kitchen PROAIR HFA 108 (90 BASE) MCG/ACT inhaler Inhale 2 puffs into the lungs every 4 (four) hours as needed for shortness of breath.     . Probiotic Product (PROBIOTIC DAILY PO) Take 1 tablet by mouth daily.     . promethazine (PHENERGAN) 25 MG tablet Take 12.5 mg by mouth every 6 (six) hours as needed for  nausea or vomiting.     . ranitidine (ZANTAC) 150 MG tablet Take 150 mg by mouth 2 (two) times daily.     Marland Kitchen rOPINIRole (REQUIP) 0.25 MG tablet Take 1 tablet (0.25 mg total) by mouth at bedtime. 30 tablet 2  . silver sulfADIAZINE (SILVADENE) 1 % cream Apply 1 application topically daily. Apply dime size thickness to affected area daily 50 g 0  . traMADol (ULTRAM) 50 MG tablet Take 1 tablet (50 mg total) by mouth every 6 (six) hours as needed. (Patient taking differently: Take 50 mg by mouth every 6 (six) hours as needed for moderate pain or severe pain. ) 60 tablet 2  . atorvastatin (LIPITOR) 10 MG tablet Take 1 tablet (10 mg total) by mouth daily at 6 PM. (Patient not taking: Reported on 03/11/2014) 90 tablet 3   No current facility-administered medications on file prior to visit.   Active Ambulatory Problems    Diagnosis Date Noted  . POLYCYSTIC OVARY 03/08/2006  . Obesity 03/08/2006  . ASTHMA, UNSPECIFIED 03/08/2006  . Cellulitis, leg 02/26/2011  . Diabetes mellitus type 2, uncontrolled, without complications 30/86/5784  . HTN (hypertension) 02/26/2011  . Elevated cholesterol with high triglycerides 03/02/2011  . Endometrial cancer 07/03/2012  . Cushing's disease 10/15/2013  . Sinus tachycardia by electrocardiogram 01/01/2014  . Type 2 diabetes mellitus with diabetic polyneuropathy 01/13/2014  . Cellulitis 03/02/2014  . Cellulitis of right lower extremity   . Hyponatremia   . Blood poisoning    Resolved Ambulatory Problems    Diagnosis Date Noted  . Severe sepsis 08/21/2013  . Dog bite of right forearm without complication 69/62/9528   Past Medical History  Diagnosis Date  . Cellulitis of lower leg   . Hypertension   . Irregular menstrual bleeding   . PCOS (polycystic ovarian syndrome)   . Tachycardia   . Abnormal Pap smear 2000  . Endometrial ca 12/20/2011  . Asthma   . Family history of anesthesia complication   . Cushing's syndrome 09/25/2013  . Cushing syndrome  11/26/2013  . Diabetes mellitus   . Anxiety   . Neuromuscular disorder    History  Substance Use Topics  . Smoking status: Never Smoker   . Smokeless tobacco: Never Used  . Alcohol Use: 0.0 oz/week    0 Standard drinks or equivalent per week     Comment: rare  family history includes Coronary artery disease in her father and mother; Diabetes in her father, mother, and sister; Diabetes type II in her father and mother; Heart disease in her father, mother, and sister; Hyperlipidemia in her father and mother; Hypertension in her mother.   Review of Systems   Constitutional: Negative for fever, chills, diaphoresis, activity change, appetite change, fatigue and unexpected weight  change.  HENT: Negative for congestion, sore throat, rhinorrhea, sneezing, trouble swallowing and sinus pressure.  Eyes: Negative for photophobia and visual disturbance.  Respiratory: Negative for cough, chest tightness, shortness of breath, wheezing and stridor.  Cardiovascular: Negative for chest pain, palpitations and leg swelling.  Gastrointestinal: Negative for nausea, vomiting, abdominal pain, diarrhea, constipation, blood in stool, abdominal distention and anal bleeding.  Genitourinary: Negative for dysuria, hematuria, flank pain and difficulty urinating.  Musculoskeletal: Negative for myalgias, back pain, joint swelling, arthralgias and gait problem.  Skin: positive rash per hpi Neurological: positive for headache. Negative for dizziness, tremors, weakness and light-headedness.  Hematological: Negative for adenopathy. Does not bruise/bleed easily.  Psychiatric/Behavioral: positive for mood instability      Objective:   Physical Exam  BP 122/79 mmHg  Pulse 101  Temp(Src) 98.3 F (36.8 C) (Oral)  Ht 5\' 5"  (1.651 m)  Wt 272 lb (123.378 kg)  BMI 45.26 kg/m2  LMP 01/17/2012 Physical Exam  Constitutional:  oriented to person, place, and time. appears well-developed and well-nourished. No distress.    HENT:  Mouth/Throat: Oropharynx is clear and moist. No oropharyngeal exudate.  Cardiovascular: Normal rate, regular rhythm and normal heart sounds. Exam reveals no gallop and no friction rub.  No murmur heard.  Pulmonary/Chest: Effort normal and breath sounds normal. No respiratory distress.  has no wheezes.  Abdominal: Soft. Bowel sounds are normal.  exhibits no distension. There is no tenderness.  Lymphadenopathy: no cervical adenopathy.  Neurological: alert and oriented to person, place, and time.  Skin: Skin is warm and dry. No rash noted. No erythema. Dry, flaky Psychiatric: a normal mood and affect. behavior is normal.  Ext: trace edema       Assessment & Plan:  - will check immune deficiency - moisturize dry skin - compression socks - for venous stasis - would recommend trial of antibiotics for pre-emptive treatment with amoxicillin 500mg  tid, with intent to seek out care if she feels onset occurring again.

## 2014-04-01 NOTE — Progress Notes (Deleted)
Patient ID: Kelli Allen, female   DOB: 11-20-1975, 39 y.o.   MRN: [id number] .

## 2014-04-02 LAB — IGG, IGA, IGM
IGM, SERUM: 125 mg/dL (ref 52–322)
IgA: 89 mg/dL (ref 69–380)
IgG (Immunoglobin G), Serum: 913 mg/dL (ref 690–1700)

## 2014-04-03 LAB — COMPLEMENT, TOTAL

## 2014-04-08 ENCOUNTER — Other Ambulatory Visit: Payer: Self-pay | Admitting: Family Medicine

## 2014-04-09 ENCOUNTER — Encounter: Payer: Self-pay | Admitting: Internal Medicine

## 2014-04-10 ENCOUNTER — Encounter: Payer: Self-pay | Admitting: Family Medicine

## 2014-04-16 NOTE — Telephone Encounter (Signed)
Please fax letter that was created today to Raliegh Ip Attention Jonette Eva at [phone].

## 2014-04-24 DIAGNOSIS — M8580 Other specified disorders of bone density and structure, unspecified site: Secondary | ICD-10-CM | POA: Insufficient documentation

## 2014-04-25 ENCOUNTER — Encounter: Payer: Self-pay | Admitting: Internal Medicine

## 2014-04-25 ENCOUNTER — Encounter: Payer: Self-pay | Admitting: Family Medicine

## 2014-04-28 ENCOUNTER — Telehealth: Payer: Self-pay | Admitting: *Deleted

## 2014-04-28 NOTE — Telephone Encounter (Signed)
Patient called with updates on her health.  She has had no more episodes/flare ups, her MD is lowering her prednisone dose, her A1C down to 7.  She would like to know your recommendation on the antibiotics you discussed at her office visit and your interpretations of her lab results.  Please advise. Landis Gandy, RN

## 2014-04-29 ENCOUNTER — Ambulatory Visit (INDEPENDENT_AMBULATORY_CARE_PROVIDER_SITE_OTHER): Payer: BLUE CROSS/BLUE SHIELD | Admitting: Physical Therapy

## 2014-04-29 ENCOUNTER — Encounter: Payer: Self-pay | Admitting: Physical Therapy

## 2014-04-29 DIAGNOSIS — R52 Pain, unspecified: Secondary | ICD-10-CM

## 2014-04-29 DIAGNOSIS — R531 Weakness: Secondary | ICD-10-CM

## 2014-04-29 DIAGNOSIS — R6889 Other general symptoms and signs: Secondary | ICD-10-CM

## 2014-04-29 NOTE — Therapy (Signed)
[facility] [address] [place], [place], [address] Phone: [phone]   Fax:  [phone]  Physical Therapy Evaluation  Patient Details  Name: Kelli Allen MRN: [id number] Date of Birth: [birth year] Referring Provider:  Lovett Calender, PA-C  Encounter Date: 04/29/2014      PT End of Session - 04/29/14 1405    Visit Number 1   Number of Visits 12   Date for PT Re-Evaluation 06/10/14   PT Start Time 0935   PT Stop Time 1027   PT Time Calculation (min) 52 min   Activity Tolerance Patient limited by pain      Past Medical History  Diagnosis Date  . Cellulitis of lower leg     x3 on right leg  . Hypertension   . Irregular menstrual bleeding   . PCOS (polycystic ovarian syndrome)   . Tachycardia   . Abnormal Pap smear 2000  . Endometrial ca 12/20/2011  . Asthma     no issues in years  . Family history of anesthesia complication     sister had hard time waking up and problems breathing  . Cushing's syndrome 09/25/2013    MRI done on 10/02/2013  . 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 Diabetes mellitus   . Anxiety   . Neuromuscular disorder     DIABETIC NEUROPATHY    Past Surgical History  Procedure Laterality Date  . Hysteroscopy w/d&c  01/24/2012    Procedure: DILATATION AND CURETTAGE /HYSTEROSCOPY;  Surgeon: Betsy Coder, MD;  Location: [facility];  Service: Gynecology;  Laterality: N/A;  with vulvar biopsies   . Intrauterine device (iud) insertion  01/24/2012    Procedure: INTRAUTERINE DEVICE (IUD) INSERTION;  Surgeon: Betsy Coder, MD;  Location: [facility];  Service: Gynecology;  Laterality: N/A;  . Dilation and curettage of uterus    . Robotic assisted total hysterectomy with bilateral salpingo oopherectomy Bilateral 07/02/2012    Procedure: ROBOTIC ASSISTED TOTAL HYSTERECTOMY WITH BILATERAL SALPINGO OOPHORECTOMY ;  Surgeon: Imagene Gurney A.  Alycia Rossetti, MD;  Location: [facility];  Service: Gynecology;  Laterality: Bilateral;  . Abdominal hysterectomy    . Transsphenoid hypophysectomy  11/26/2013    ACTH secreting pituitary lesion causing Cushing's; [facility]; Angelene Giovanni, MD    There were no vitals filed for this visit.  Visit Diagnosis:  Pain, generalized - Plan: PT plan of care cert/re-cert  Weakness generalized - Plan: PT plan of care cert/re-cert  Activity intolerance - Plan: PT plan of care cert/re-cert      Subjective Assessment - 04/29/14 0941    Subjective Pt is referred to PT with a dx of spinal stenosis, she reports she has Rt knee pain and the MD told her it was due to a compressed nerve in her back. She is handling the pain better now. If PT doesn't help she will have a spinal injeciton    Pertinent History Pt reports she was dx with cushings dz and has been on a lot of medicine that caused her to gain weight, get DM and have other medical issues.  Last year a tumor was found on her pituitary gland, it was determined this was her problem and she had it surgically removed in Sept 2015.  They are now working to wean her off medication and she feels her overall problems now with swelling, pain , stiffness may be from steriod withdrawals.    How long can you sit  comfortably? able to sit even with the pain   How long can you stand comfortably? pushes through and tries to ignore the pain   Diagnostic tests MRI performed about a month ago and saw the nerve compression. x-ray of knee and sacrum - these showed mild arthritis.    Patient Stated Goals reduce pain and go up/down stairs better. tolerate standing to cook, go out in the community and then not pay for it later.    Currently in Pain? Yes  pain increases significantly with bending of the knees. with pressure   Pain Score 1    Pain Location --  bilat LE's and buttocks, Usually pain is in the knee   Pain Descriptors / Indicators Cramping;Aching   Pain Radiating Towards  bilat LE's cramp at the end of the day    Pain Onset More than a month ago   Pain Frequency Constant   Aggravating Factors  alternating legs on steps, bending her Rt knee.  transitioning sit to stand   Effect of Pain on Daily Activities overall body pain, developing numbness and tingling into both of her hands while at work and it will wake her at night.  (+) Tinels sign.             [facility] PT Assessment - 04/29/14 0001    Assessment   Medical Diagnosis lumbar stenosis   Onset Date 01/28/14  with second drop of prednisone dose   Next MD Visit not set   Prior Therapy none   Precautions   Precautions None   Balance Screen   Has the patient fallen in the past 6 months No   Has the patient had a decrease in activity level because of a fear of falling?  No   Is the patient reluctant to leave their home because of a fear of falling?  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    Leisure cook and bake   Observation/Other Assessments   Focus on Therapeutic Outcomes (FOTO)  53% limited   Posture/Postural Control   Posture/Postural Control Postural limitations   Postural Limitations Rounded Shoulders;Forward head;Decreased lumbar lordosis  extra abdominal girth.    ROM / Strength   AROM / PROM / Strength AROM;Strength   AROM   Overall AROM Comments lumbar WNL pain with extension    AROM Assessment Site --  slight tightness in bilat ITB, and prone quad stretch.    Strength   Overall Strength Comments Lt LE WNL  Rt multifidis fair (-), Lt good (-)   Strength Assessment Site Hip;Knee   Right/Left Hip Right   Right Hip Flexion --  5-/5   Right Hip Extension 5/5   Right Hip ABduction --  5-/5   Right/Left Knee Right   Right Knee Flexion 4+/5   Right Knee  Extension 4/5   Special Tests    Special Tests --  (-) lumbar and SI special tests                   [facility] Adult PT Treatment/Exercise - 04/29/14 0001    Exercises   Exercises Knee/Hip  wrist stretch for carpal tunnel   Knee/Hip Exercises: Stretches   Quad Stretch 30 seconds  prone with strap  ITB Stretch 30 seconds  supine cross body with strap                PT Education - 04/29/14 1404    Education provided Yes   Education Details HEP for stretches   Person(s) Educated Patient   Methods Explanation;Demonstration;Handout   Comprehension Verbalized understanding          PT Short Term Goals - 04/29/14 1408    PT SHORT TERM GOAL #1   Title I with initial HEP   Time 3   Period Weeks   Status New   PT SHORT TERM GOAL #2   Title increase strength of Rt hip flexors =/> 5-/5   Time 3   Period Weeks   Status New   PT SHORT TERM GOAL #3   Title decrease pain =/> 25% in the Rt knee   Time 3   Period Weeks   Status New           PT Long Term Goals - 04/29/14 1409    PT LONG TERM GOAL #1   Title I with advanced HEP   Time 6   Period Weeks   Status New   PT LONG TERM GOAL #2   Title ambulate up/down stairs with reciprocal gait and no increase in knee pain   Time 6   Period Weeks   Status New   PT LONG TERM GOAL #3   Title improve FOTO =/< 34% limited   Time 6   Period Weeks   Status New   PT LONG TERM GOAL #4   Title stand and bake/cook without pain reprocussions the next day    Time 6   Period Weeks   Status New               Plan - 04/29/14 1406    Clinical Impression Statement 39 yo female with involved medical history.  Presents with weakness in her core and Rt LE along with some soft tissue tightness.  These may be the cause of her symptoms or they may be attributed to medication withdrawl.  We will see for PT and see if symptoms of pain reduce.    Pt will benefit from skilled therapeutic intervention in order to  improve on the following deficits Impaired flexibility;Increased edema;Postural dysfunction;Pain;Decreased strength   Rehab Potential Good   PT Frequency 2x / week   PT Duration 6 weeks   PT Treatment/Interventions Moist Heat;Patient/family education;Passive range of motion;Therapeutic exercise;Ultrasound;Manual techniques;Traction;Cryotherapy;Stair training;Neuromuscular re-education;Functional mobility training;Electrical Stimulation   PT Next Visit Plan add in core and LE strengthening, modalities PRN   Consulted and Agree with Plan of Care Patient         Problem List Patient Active Problem List   Diagnosis Date Noted  . Cellulitis of right lower extremity   . Hyponatremia   . Blood poisoning   . Cellulitis 03/02/2014  . Type 2 diabetes mellitus with diabetic polyneuropathy 01/13/2014  . Sinus tachycardia by electrocardiogram 01/01/2014  . Cushing's disease 10/15/2013  . Endometrial cancer 07/03/2012  . Elevated cholesterol with high triglycerides 03/02/2011  . Cellulitis, leg 02/26/2011  . Diabetes mellitus type 2, uncontrolled, without complications 03/49/1791  . HTN (hypertension) 02/26/2011  . POLYCYSTIC OVARY 03/08/2006  . Obesity 03/08/2006  . ASTHMA, UNSPECIFIED 03/08/2006    Jeral Pinch PT 04/29/2014, 2:16 PM  [facility] [address] [place], [place], [address] Phone: [phone]   Fax:  [phone]

## 2014-04-29 NOTE — Telephone Encounter (Signed)
Her testing for immune response was normal, which is reassuring. We discussed having her use antibiotics as needed, at the get go of when she thinks it is starting with amoxicillin 500mg  TID, and have her seek immmediate medical attention. IF she is hesistant at taking antibiotics pre-emptively,  the alternative would be for her to go to MD office, urgent care ,or ED for quick evaluation if it occurs again since she quickly develops sepsis based upon her last 2 episodes of cellulitis.

## 2014-04-29 NOTE — Patient Instructions (Signed)
CARPAL TUNNEL (Nerve Compression Syndrome): Wrist Stretch [place] [id number]   Extend right arm with fingers facing down. With left hand, gently pull fingers of right hand toward body. Hold position for _30__ secs. Repeat with arms switched. Repeat 1-2___ times, alternating arms. Do _1_ times per day.  Copyright  VHI. All rights reserved.  Outer Hip Stretch: Reclined IT Band Stretch (Strap)   Strap around opposite foot, pull across only as far as possible with shoulders on mat. Hold for _30__ secs. Repeat __1-2__ times each leg.  Copyright  VHI. All rights reserved.  Hip Flexor, Quadricep Stretch: Belly Down (Strap)   Engage stable pelvic tilt. Hold top of foot with hand or strap. Keep chest down.  Hold for _30___ sec. Repeat _1-2___ times each leg.  Copyright  VHI. All rights reserved.

## 2014-05-01 ENCOUNTER — Ambulatory Visit (INDEPENDENT_AMBULATORY_CARE_PROVIDER_SITE_OTHER): Payer: BLUE CROSS/BLUE SHIELD | Admitting: Physical Therapy

## 2014-05-01 DIAGNOSIS — R52 Pain, unspecified: Secondary | ICD-10-CM | POA: Diagnosis not present

## 2014-05-01 DIAGNOSIS — R6889 Other general symptoms and signs: Secondary | ICD-10-CM | POA: Diagnosis not present

## 2014-05-01 DIAGNOSIS — R531 Weakness: Secondary | ICD-10-CM

## 2014-05-01 NOTE — Therapy (Signed)
[place], [place], [address] Phone: [phone]   Fax:  [phone]  Physical Therapy Treatment  Patient Details  Name: Kelli Allen MRN: [id number] Date of Birth: [birth year] Referring Provider:  Lovett Calender, PA-C  Encounter Date: 05/01/2014      PT End of Session - 05/01/14 1455    Visit Number 2   Number of Visits 12   Date for PT Re-Evaluation 06/10/14   PT Start Time 6440   PT Stop Time 1533   PT Time Calculation (min) 38 min      Past Medical History  Diagnosis Date  . Cellulitis of lower leg     x3 on right leg  . Hypertension   . Irregular menstrual bleeding   . PCOS (polycystic ovarian syndrome)   . Tachycardia   . Abnormal Pap smear 2000  . Endometrial ca 12/20/2011  . Asthma     no issues in years  . Family history of anesthesia complication     sister had hard time waking up and problems breathing  . Cushing's syndrome 09/25/2013    MRI done on 10/02/2013  . 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 Diabetes mellitus   . Anxiety   . Neuromuscular disorder     DIABETIC NEUROPATHY    Past Surgical History  Procedure Laterality Date  . Hysteroscopy w/d&c  01/24/2012    Procedure: DILATATION AND CURETTAGE /HYSTEROSCOPY;  Surgeon: Betsy Coder, MD;  Location: [facility];  Service: Gynecology;  Laterality: N/A;  with vulvar biopsies   . Intrauterine device (iud) insertion  01/24/2012    Procedure: INTRAUTERINE DEVICE (IUD) INSERTION;  Surgeon: Betsy Coder, MD;  Location: [facility];  Service: Gynecology;  Laterality: N/A;  . Dilation and curettage of uterus    . Robotic assisted total hysterectomy with bilateral salpingo oopherectomy Bilateral 07/02/2012    Procedure: ROBOTIC ASSISTED TOTAL HYSTERECTOMY WITH BILATERAL SALPINGO OOPHORECTOMY ;  Surgeon: Imagene Gurney A. Alycia Rossetti, MD;  Location: [facility];  Service:  Gynecology;  Laterality: Bilateral;  . Abdominal hysterectomy    . Transsphenoid hypophysectomy  11/26/2013    ACTH secreting pituitary lesion causing Cushing's; [facility]; Angelene Giovanni, MD    There were no vitals filed for this visit.  Visit Diagnosis:  Pain, generalized  Weakness generalized  Activity intolerance      Subjective Assessment - 05/01/14 1507    Subjective Pt performed   Currently in Pain? Yes   Pain Score 3    Pain Location --  "every where"   Pain Descriptors / Indicators Aching            [facility] PT Assessment - 05/01/14 0001    Assessment   Medical Diagnosis lumbar stenosis   Onset Date 01/28/14                     [facility] Adult PT Treatment/Exercise - 05/01/14 0001    Exercises   Exercises Knee/Hip;Lumbar   Lumbar Exercises: Stretches   Passive Hamstring Stretch 3 reps;20 seconds  Rt/Lt   ITB Stretch 3 reps;20 seconds  cross body stretch with strap; performed each leg.    Lumbar Exercises: Supine   Ab Set 10 reps;5 seconds  mulitple cues for proper form   Other Supine Lumbar Exercises Trans Abd with knee in/out x 10    Other Supine Lumbar Exercises attempted calf stretch with strap; pt reported she feels  calf stretch with HS stretch.    Knee/Hip Exercises: Aerobic   Stationary Bike NuStep L3 (arms/legs) x 5 min    Knee/Hip Exercises: Seated   Long Arc Quad 1 set;Left;Right  with 5 sec hold x 10 reps    Other Seated Knee Exercises Seated marching x 20 x 2 sets                PT Education - 05/01/14 1630    Education provided Yes   Education Details HEP; reviewed log roll into/out of bed    Person(s) Educated Patient   Methods Handout;Explanation   Comprehension Verbalized understanding;Returned demonstration          PT Short Term Goals - 04/29/14 1408    PT SHORT TERM GOAL #1   Title I with initial HEP   Time 3   Period Weeks   Status New   PT SHORT TERM GOAL #2   Title increase strength of Rt hip  flexors =/> 5-/5   Time 3   Period Weeks   Status New   PT SHORT TERM GOAL #3   Title decrease pain =/> 25% in the Rt knee   Time 3   Period Weeks   Status New           PT Long Term Goals - 04/29/14 1409    PT LONG TERM GOAL #1   Title I with advanced HEP   Time 6   Period Weeks   Status New   PT LONG TERM GOAL #2   Title ambulate up/down stairs with reciprocal gait and no increase in knee pain   Time 6   Period Weeks   Status New   PT LONG TERM GOAL #3   Title improve FOTO =/< 34% limited   Time 6   Period Weeks   Status New   PT LONG TERM GOAL #4   Title stand and bake/cook without pain reprocussions the next day    Time 6   Period Weeks   Status New               Plan - 05/01/14 1624    Clinical Impression Statement Pt talkative throughout session; required some redirecting to stay on task of exercise. Pt required some VC for form of exercise to reduce angle of pull for stretches to decrease pain.    Pt will benefit from skilled therapeutic intervention in order to improve on the following deficits Impaired flexibility;Increased edema;Postural dysfunction;Pain;Decreased strength   Rehab Potential Good   PT Frequency 2x / week   PT Duration 6 weeks   PT Treatment/Interventions Moist Heat;Patient/family education;Passive range of motion;Therapeutic exercise;Ultrasound;Manual techniques;Traction;Cryotherapy;Stair training;Neuromuscular re-education;Functional mobility training;Electrical Stimulation   PT Next Visit Plan Continue core and LE strengthening. Modalities PRN.    Consulted and Agree with Plan of Care Patient        Problem List Patient Active Problem List   Diagnosis Date Noted  . Cellulitis of right lower extremity   . Hyponatremia   . Blood poisoning   . Cellulitis 03/02/2014  . Type 2 diabetes mellitus with diabetic polyneuropathy 01/13/2014  . Sinus tachycardia by electrocardiogram 01/01/2014  . Cushing's disease 10/15/2013  .  Endometrial cancer 07/03/2012  . Elevated cholesterol with high triglycerides 03/02/2011  . Cellulitis, leg 02/26/2011  . Diabetes mellitus type 2, uncontrolled, without complications 25/49/8264  . HTN (hypertension) 02/26/2011  . POLYCYSTIC OVARY 03/08/2006  . Obesity 03/08/2006  . ASTHMA, UNSPECIFIED 03/08/2006    Anderson Malta  [place], PTA 05/01/2014 4:31 PM  [place], [place], [address] Phone: [phone]   Fax:  [phone]

## 2014-05-01 NOTE — Patient Instructions (Signed)
  Abdominal Bracing With Pelvic Floor (Hook-Lying)   With neutral spine, tighten pelvic floor and abdominals. Hold 10 seconds. Repeat __10_ times. Do _1__ times a day.   Copyright  VHI. All rights reserved.    Copyright  VHI. All rights reserved.  Pt issued paper copy of trans abdominal series.  Knee to Chest: Transverse Plane Stability  Copyright  VHI. All rights reserved.  Hip External Rotation With Pillow: Transverse Plane Stability   One knee bent, one leg straight, on pillow. Slowly roll bent knee out. Be sure pelvis does not rotate. Do _10__ times. Restabilize pelvis. Repeat with other leg. Do _1-2__ sets, _1__ times per day.

## 2014-05-04 ENCOUNTER — Encounter (HOSPITAL_BASED_OUTPATIENT_CLINIC_OR_DEPARTMENT_OTHER): Payer: Self-pay | Admitting: *Deleted

## 2014-05-04 ENCOUNTER — Emergency Department (HOSPITAL_BASED_OUTPATIENT_CLINIC_OR_DEPARTMENT_OTHER)
Admission: EM | Admit: 2014-05-04 | Discharge: 2014-05-04 | Disposition: A | Discharge status: Home / Self Care | Payer: BLUE CROSS/BLUE SHIELD | Attending: Emergency Medicine | Admitting: Emergency Medicine

## 2014-05-04 ENCOUNTER — Ambulatory Visit (INDEPENDENT_AMBULATORY_CARE_PROVIDER_SITE_OTHER): Payer: BLUE CROSS/BLUE SHIELD | Admitting: Physical Therapy

## 2014-05-04 DIAGNOSIS — R52 Pain, unspecified: Secondary | ICD-10-CM | POA: Diagnosis not present

## 2014-05-04 DIAGNOSIS — I10 Essential (primary) hypertension: Secondary | ICD-10-CM | POA: Insufficient documentation

## 2014-05-04 DIAGNOSIS — Z8542 Personal history of malignant neoplasm of other parts of uterus: Secondary | ICD-10-CM | POA: Diagnosis not present

## 2014-05-04 DIAGNOSIS — E1140 Type 2 diabetes mellitus with diabetic neuropathy, unspecified: Secondary | ICD-10-CM | POA: Insufficient documentation

## 2014-05-04 DIAGNOSIS — Z79899 Other long term (current) drug therapy: Secondary | ICD-10-CM | POA: Diagnosis not present

## 2014-05-04 DIAGNOSIS — R6889 Other general symptoms and signs: Secondary | ICD-10-CM | POA: Diagnosis not present

## 2014-05-04 DIAGNOSIS — Z7951 Long term (current) use of inhaled steroids: Secondary | ICD-10-CM | POA: Insufficient documentation

## 2014-05-04 DIAGNOSIS — R531 Weakness: Secondary | ICD-10-CM | POA: Diagnosis not present

## 2014-05-04 DIAGNOSIS — Z791 Long term (current) use of non-steroidal anti-inflammatories (NSAID): Secondary | ICD-10-CM | POA: Insufficient documentation

## 2014-05-04 DIAGNOSIS — L03115 Cellulitis of right lower limb: Secondary | ICD-10-CM | POA: Insufficient documentation

## 2014-05-04 DIAGNOSIS — Z794 Long term (current) use of insulin: Secondary | ICD-10-CM | POA: Diagnosis not present

## 2014-05-04 DIAGNOSIS — J45909 Unspecified asthma, uncomplicated: Secondary | ICD-10-CM | POA: Insufficient documentation

## 2014-05-04 DIAGNOSIS — M79604 Pain in right leg: Secondary | ICD-10-CM | POA: Diagnosis present

## 2014-05-04 LAB — URINE MICROSCOPIC-ADD ON

## 2014-05-04 LAB — CBC WITH DIFFERENTIAL/PLATELET
BASOS PCT: 1 % (ref 0–1)
Basophils Absolute: 0.1 10*3/uL (ref 0.0–0.1)
Eosinophils Absolute: 1.1 10*3/uL — ABNORMAL HIGH (ref 0.0–0.7)
Eosinophils Relative: 10 % — ABNORMAL HIGH (ref 0–5)
HCT: 36.7 % (ref 36.0–46.0)
Hemoglobin: 12.2 g/dL (ref 12.0–15.0)
LYMPHS ABS: 3.1 10*3/uL (ref 0.7–4.0)
Lymphocytes Relative: 27 % (ref 12–46)
MCH: 31.3 pg (ref 26.0–34.0)
MCHC: 33.2 g/dL (ref 30.0–36.0)
MCV: 94.1 fL (ref 78.0–100.0)
Monocytes Absolute: 0.9 10*3/uL (ref 0.1–1.0)
Monocytes Relative: 8 % (ref 3–12)
NEUTROS ABS: 6 10*3/uL (ref 1.7–7.7)
Neutrophils Relative %: 54 % (ref 43–77)
Platelets: 357 10*3/uL (ref 150–400)
RBC: 3.9 MIL/uL (ref 3.87–5.11)
RDW: 12.5 % (ref 11.5–15.5)
WBC: 11.2 10*3/uL — ABNORMAL HIGH (ref 4.0–10.5)

## 2014-05-04 LAB — URINALYSIS, ROUTINE W REFLEX MICROSCOPIC
BILIRUBIN URINE: NEGATIVE
GLUCOSE, UA: NEGATIVE mg/dL
HGB URINE DIPSTICK: NEGATIVE
Ketones, ur: NEGATIVE mg/dL
Nitrite: NEGATIVE
PROTEIN: NEGATIVE mg/dL
Specific Gravity, Urine: 1.017 (ref 1.005–1.030)
UROBILINOGEN UA: 0.2 mg/dL (ref 0.0–1.0)
pH: 5.5 (ref 5.0–8.0)

## 2014-05-04 LAB — BASIC METABOLIC PANEL
ANION GAP: 10 (ref 5–15)
BUN: 18 mg/dL (ref 6–23)
CHLORIDE: 101 mmol/L (ref 96–112)
CO2: 26 mmol/L (ref 19–32)
CREATININE: 0.77 mg/dL (ref 0.50–1.10)
Calcium: 10.1 mg/dL (ref 8.4–10.5)
GFR calc Af Amer: >90 mL/min (ref >90)
GFR calc non Af Amer: >90 mL/min (ref >90)
GLUCOSE: 126 mg/dL — AB (ref 70–99)
Potassium: 3.9 mmol/L (ref 3.5–5.1)
SODIUM: 137 mmol/L (ref 135–145)

## 2014-05-04 LAB — I-STAT CG4 LACTIC ACID, ED: LACTIC ACID, VENOUS: 1.34 mmol/L (ref 0.5–2.0)

## 2014-05-04 MED ORDER — CLINDAMYCIN PHOSPHATE 600 MG/50ML IV SOLN
600.0000 mg | Freq: Once | INTRAVENOUS | Status: AC
  Administered 2014-05-04: 600 mg via INTRAVENOUS
  Filled 2014-05-04: qty 50

## 2014-05-04 MED ORDER — CLINDAMYCIN HCL 300 MG PO CAPS: 300.0000 mg | ORAL_CAPSULE | Freq: Four times a day (QID) | ORAL | Status: DC

## 2014-05-04 NOTE — ED Notes (Signed)
C/o burning, stinging and swelling to rt lower leg x 2 weeks

## 2014-05-04 NOTE — Discharge Instructions (Signed)
Clindamycin as prescribed.  Follow-up with Dr. Graylon Good this week, and return to the ER if your symptoms significantly worsen or change.   Cellulitis Cellulitis is an infection of the skin and the tissue beneath it. The infected area is usually red and tender. Cellulitis occurs most often in the arms and lower legs.  CAUSES  Cellulitis is caused by bacteria that enter the skin through cracks or cuts in the skin. The most common types of bacteria that cause cellulitis are staphylococci and streptococci. SIGNS AND SYMPTOMS   Redness and warmth.  Swelling.  Tenderness or pain.  Fever. DIAGNOSIS  Your health care provider can usually determine what is wrong based on a physical exam. Blood tests may also be done. TREATMENT  Treatment usually involves taking an antibiotic medicine. HOME CARE INSTRUCTIONS   Take your antibiotic medicine as directed by your health care provider. Finish the antibiotic even if you start to feel better.  Keep the infected arm or leg elevated to reduce swelling.  Apply a warm cloth to the affected area up to 4 times per day to relieve pain.  Take medicines only as directed by your health care provider.  Keep all follow-up visits as directed by your health care provider. SEEK MEDICAL CARE IF:   You notice red streaks coming from the infected area.  Your red area gets larger or turns dark in color.  Your bone or joint underneath the infected area becomes painful after the skin has healed.  Your infection returns in the same area or another area.  You notice a swollen bump in the infected area.  You develop new symptoms.  You have a fever. SEEK IMMEDIATE MEDICAL CARE IF:   You feel very sleepy.  You develop vomiting or diarrhea.  You have a general ill feeling (malaise) with muscle aches and pains. MAKE SURE YOU:   Understand these instructions.  Will watch your condition.  Will get help right away if you are not doing well or get  worse. Document Released: 10/05/2004 Document Revised: 05/12/2013 Document Reviewed: 03/13/2011 [facility] Patient Information 2015 [place], [place]. This information is not intended to replace advice given to you by your health care provider. Make sure you discuss any questions you have with your health care provider.

## 2014-05-04 NOTE — Telephone Encounter (Signed)
Relayed information, patient in agreement.  She would like to keep this on file at her pharmacy.  She feels she may be approaching a flare, will be going to Dr. Tamala Julian today for labs.    Please advise quantity and refills for Amoxicillin 500mg  TID.

## 2014-05-04 NOTE — Patient Instructions (Signed)
Bridge   Lie back, legs bent. Inhale, pressing hips up. Keeping ribs in, lengthen lower back. Exhale, rolling down along spine from top. Repeat __10__ times. Do _1___ sessions per day.  Copyright  VHI. All rights reserved.   [facility] [place], [place] [address]  [phone] (office) [phone] (fax)

## 2014-05-04 NOTE — Therapy (Signed)
[place], [place], [address] Phone: [phone]   Fax:  [phone]  Physical Therapy Treatment  Patient Details  Name: Kelli Allen MRN: [id number] Date of Birth: February 19, 1975 Referring Provider:  Lovett Calender, PA-C  Encounter Date: 05/04/2014      PT End of Session - 05/04/14 0736    Visit Number 3   Number of Visits 12   Date for PT Re-Evaluation 06/10/14   PT Start Time 0730   PT Stop Time 0809   PT Time Calculation (min) 39 min      Past Medical History  Diagnosis Date  . Cellulitis of lower leg     x3 on right leg  . Hypertension   . Irregular menstrual bleeding   . PCOS (polycystic ovarian syndrome)   . Tachycardia   . Abnormal Pap smear 2000  . Endometrial ca 12/20/2011  . Asthma     no issues in years  . Family history of anesthesia complication     sister had hard time waking up and problems breathing  . Cushing's syndrome 09/25/2013    MRI done on 10/02/2013  . 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 Diabetes mellitus   . Anxiety   . Neuromuscular disorder     DIABETIC NEUROPATHY    Past Surgical History  Procedure Laterality Date  . Hysteroscopy w/d&c  01/24/2012    Procedure: DILATATION AND CURETTAGE /HYSTEROSCOPY;  Surgeon: Betsy Coder, MD;  Location: [facility];  Service: Gynecology;  Laterality: N/A;  with vulvar biopsies   . Intrauterine device (iud) insertion  01/24/2012    Procedure: INTRAUTERINE DEVICE (IUD) INSERTION;  Surgeon: Betsy Coder, MD;  Location: [facility];  Service: Gynecology;  Laterality: N/A;  . Dilation and curettage of uterus    . Robotic assisted total hysterectomy with bilateral salpingo oopherectomy Bilateral 07/02/2012    Procedure: ROBOTIC ASSISTED TOTAL HYSTERECTOMY WITH BILATERAL SALPINGO OOPHORECTOMY ;  Surgeon: Imagene Gurney A. Alycia Rossetti, MD;  Location: [facility];  Service:  Gynecology;  Laterality: Bilateral;  . Abdominal hysterectomy    . Transsphenoid hypophysectomy  11/26/2013    ACTH secreting pituitary lesion causing Cushing's; [facility]; Angelene Giovanni, MD    There were no vitals filed for this visit.  Visit Diagnosis:  Pain, generalized  Weakness generalized  Activity intolerance      Subjective Assessment - 05/04/14 0737    Subjective Pt took last dose of prednisone on Saturday- now completely off of it; feeling worse today overall. Pt having further testing today (blood work/MRI).    Currently in Pain? Yes   Pain Score 7    Pain Location --  "everywhere"    Aggravating Factors  ?   Pain Relieving Factors medicine             [facility] PT Assessment - 05/04/14 0001    Assessment   Medical Diagnosis lumbar stenosis   Onset Date 01/28/14                     [facility] Adult PT Treatment/Exercise - 05/04/14 0001    Exercises   Exercises Knee/Hip;Lumbar   Lumbar Exercises: Stretches   Passive Hamstring Stretch 2 reps;30 seconds   Lower Trunk Rotation 3 reps;30 seconds  each direction    Quad Stretch 2 reps;30 seconds   ITB Stretch 2 reps;20 seconds   Lumbar Exercises: Supine   Bridge 10 reps  Lumbar Exercises: Sidelying   Clam 10 reps  2 sets each side    Knee/Hip Exercises: Aerobic   Stationary Bike NuStep L3, 4 min    Knee/Hip Exercises: Standing   Heel Raises 1 set;10 reps   Modalities   Modalities Moist Heat   Moist Heat Therapy   Number Minutes Moist Heat 10 Minutes   Moist Heat Location --  Rt knee for pain relief                 PT Education - 05/04/14 1418    Education provided Yes   Education Details HEP; added bridging.    Person(s) Educated Patient   Methods Explanation   Comprehension Returned demonstration;Verbalized understanding          PT Short Term Goals - 04/29/14 1408    PT SHORT TERM GOAL #1   Title I with initial HEP   Time 3   Period Weeks   Status New   PT SHORT  TERM GOAL #2   Title increase strength of Rt hip flexors =/> 5-/5   Time 3   Period Weeks   Status New   PT SHORT TERM GOAL #3   Title decrease pain =/> 25% in the Rt knee   Time 3   Period Weeks   Status New           PT Long Term Goals - 04/29/14 1409    PT LONG TERM GOAL #1   Title I with advanced HEP   Time 6   Period Weeks   Status New   PT LONG TERM GOAL #2   Title ambulate up/down stairs with reciprocal gait and no increase in knee pain   Time 6   Period Weeks   Status New   PT LONG TERM GOAL #3   Title improve FOTO =/< 34% limited   Time 6   Period Weeks   Status New   PT LONG TERM GOAL #4   Title stand and bake/cook without pain reprocussions the next day    Time 6   Period Weeks   Status New               Plan - 05/04/14 1419    Clinical Impression Statement Pt tolerated all exercises without increase in symptoms.  Pt reported MHP gave relief at time of application, but did not provide any lasting relief once removed. No goals met yet.    Pt will benefit from skilled therapeutic intervention in order to improve on the following deficits Impaired flexibility;Increased edema;Postural dysfunction;Pain;Decreased strength   Rehab Potential Good   PT Frequency 2x / week   PT Duration 6 weeks   PT Treatment/Interventions Moist Heat;Patient/family education;Passive range of motion;Therapeutic exercise;Ultrasound;Manual techniques;Traction;Cryotherapy;Stair training;Neuromuscular re-education;Functional mobility training;Electrical Stimulation   PT Next Visit Plan Continue core and LE strengthening. Modalities PRN.    Consulted and Agree with Plan of Care Patient        Problem List Patient Active Problem List   Diagnosis Date Noted  . Cellulitis of right lower extremity   . Hyponatremia   . Blood poisoning   . Cellulitis 03/02/2014  . Type 2 diabetes mellitus with diabetic polyneuropathy 01/13/2014  . Sinus tachycardia by electrocardiogram  01/01/2014  . Cushing's disease 10/15/2013  . Endometrial cancer 07/03/2012  . Elevated cholesterol with high triglycerides 03/02/2011  . Cellulitis, leg 02/26/2011  . Diabetes mellitus type 2, uncontrolled, without complications 90/24/0973  . HTN (hypertension) 02/26/2011  . POLYCYSTIC OVARY  03/08/2006  . Obesity 03/08/2006  . ASTHMA, UNSPECIFIED 03/08/2006    Kerin Perna, PTA 05/04/2014 2:22 PM  [place], [place], [address] Phone: [phone]   Fax:  [phone]

## 2014-05-04 NOTE — ED Provider Notes (Signed)
CSN: [id number]     Arrival date & time 05/04/14  1824 History  This chart was scribed for Veryl Speak, MD by Stephania Fragmin, ED Scribe. This patient was seen in room MH06/MH06 and the patient's care was started at 8:17 PM.    Chief Complaint  Patient presents with  . Leg Pain   Patient is a 39 y.o. female presenting with leg pain. The history is provided by the patient. No language interpreter was used.  Leg Pain Location:  Leg Time since incident:  2 weeks Injury: no   Leg location:  R lower leg Pain details:    Quality:  Burning   Radiates to:  Does not radiate   Severity:  Moderate   Onset quality:  Gradual   Duration:  2 weeks   Timing:  Constant   Progression:  Worsening Chronicity:  Recurrent Dislocation: no   Foreign body present:  No foreign bodies Prior injury to area:  No Relieved by:  Nothing Worsened by:  Nothing tried Ineffective treatments:  None tried Associated symptoms: no back pain      HPI Comments: Kelli Allen is a 39 y.o. female with a history of recurrent cellulitis and adrenal insufficiency due to transsphenoidal hypophysectomy who presents to the Emergency Department complaining of constant, burning, stinging, right lower leg pain that began 2 weeks ago. She states she is positive that this pain is due to on oncoming cellulitis infection, and would like to have antibiotics and testing before her symptoms worsen. When hospitalized with cellulitis in the past, she was typically treated in the hospital with IV vanco, and discharged home with Bactrim, Rocephin, or Clindamycin. She denies any other symptoms, including back pain or any urinary symptoms.  Patient adds she had an MRI in the past which showed bulging discs and a pinched nerve in her back.  Past Medical History  Diagnosis Date  . Cellulitis of lower leg     x3 on right leg  . Hypertension   . Irregular menstrual bleeding   . PCOS (polycystic ovarian syndrome)   . Tachycardia   . Abnormal Pap  smear 2000  . Endometrial ca 12/20/2011  . Asthma     no issues in years  . Family history of anesthesia complication     sister had hard time waking up and problems breathing  . Cushing's syndrome 09/25/2013    MRI done on 10/02/2013  . 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 Diabetes mellitus   . Anxiety   . Neuromuscular disorder     DIABETIC NEUROPATHY   Past Surgical History  Procedure Laterality Date  . Hysteroscopy w/d&c  01/24/2012    Procedure: DILATATION AND CURETTAGE /HYSTEROSCOPY;  Surgeon: Betsy Coder, MD;  Location: [facility];  Service: Gynecology;  Laterality: N/A;  with vulvar biopsies   . Intrauterine device (iud) insertion  01/24/2012    Procedure: INTRAUTERINE DEVICE (IUD) INSERTION;  Surgeon: Betsy Coder, MD;  Location: [facility];  Service: Gynecology;  Laterality: N/A;  . Dilation and curettage of uterus    . Robotic assisted total hysterectomy with bilateral salpingo oopherectomy Bilateral 07/02/2012    Procedure: ROBOTIC ASSISTED TOTAL HYSTERECTOMY WITH BILATERAL SALPINGO OOPHORECTOMY ;  Surgeon: Imagene Gurney A. Alycia Rossetti, MD;  Location: [facility];  Service: Gynecology;  Laterality: Bilateral;  . Abdominal hysterectomy    . Transsphenoid hypophysectomy  11/26/2013    ACTH secreting pituitary lesion causing Cushing's; [facility]; Angelene Giovanni,  MD   Family History  Problem Relation Age of Onset  . Diabetes type II Mother   . Coronary artery disease Mother   . Diabetes Mother   . Heart disease Mother   . Hyperlipidemia Mother   . Hypertension Mother   . Diabetes type II Father   . Coronary artery disease Father   . Diabetes Father   . Heart disease Father   . Hyperlipidemia Father   . Heart disease Sister   . Diabetes Sister    History  Substance Use Topics  . Smoking status: Never Smoker   . Smokeless tobacco: Never Used  . Alcohol Use: 0.0 oz/week    0 Standard drinks or equivalent per week      Comment: rare   OB History    Gravida Para Term Preterm AB TAB SAB Ectopic Multiple Living   0              Review of Systems  Musculoskeletal: Positive for myalgias. Negative for back pain.       Right leg pain  All other systems reviewed and are negative.  Allergies  Antibacterial hand soap  Home Medications   Prior to Admission medications   Medication Sig Start Date End Date Taking? Authorizing Provider  albuterol (PROVENTIL) (2.5 MG/3ML) 0.083% nebulizer solution Take 2.5 mg by nebulization every 6 (six) hours as needed for wheezing or shortness of breath.    Historical Provider, MD  atorvastatin (LIPITOR) 10 MG tablet Take 1 tablet (10 mg total) by mouth daily at 6 PM. 10/15/13   Wardell Honour, MD  BAYER CONTOUR TEST test strip  12/12/13   Historical Provider, MD  cyclobenzaprine (FLEXERIL) 10 MG tablet Take 1 tablet (10 mg total) by mouth 2 (two) times daily as needed for muscle spasms. 03/27/14   Kaitlyn Szekalski, PA-C  diclofenac sodium (VOLTAREN) 1 % GEL Apply 4 g topically 4 (four) times daily. 03/11/14   Wardell Honour, MD  Ergocalciferol (VITAMIN D2) 2000 UNITS TABS Take 1 tablet by mouth.    Historical Provider, MD  FLUoxetine (PROZAC) 20 MG tablet Take 1 tablet (20 mg total) by mouth daily. 01/08/14   Wardell Honour, MD  gabapentin (NEURONTIN) 300 MG capsule Take 900 mg by mouth at bedtime.    Historical Provider, MD  hydrocortisone (ANUSOL-HC) 25 MG suppository Place 1 suppository (25 mg total) rectally 2 (two) times daily. Patient taking differently: Place 25 mg rectally daily as needed for hemorrhoids.  02/13/14   Wardell Honour, MD  hydrocortisone 2.5 % ointment Apply topically 2 (two) times daily. Patient taking differently: Apply 1 application topically daily as needed (inflammation, itching).  01/08/14   Wardell Honour, MD  insulin aspart (NOVOLOG FLEXPEN) 100 UNIT/ML FlexPen Inject 22 units with a correction 1:50 > 150 three times daily before meals 10/10/13    Historical Provider, MD  Insulin Glargine (LANTUS SOLOSTAR) 100 UNIT/ML Solostar Pen Inject 80 Units into the skin at bedtime.  09/25/13   Historical Provider, MD  labetalol (NORMODYNE) 200 MG tablet Take 1 tablet (200 mg total) by mouth 2 (two) times daily. 01/01/14   Tereasa Coop, PA-C  Liraglutide (VICTOZA) 18 MG/3ML SOPN Inject 1.8 mg into the skin daily.  10/17/13   Historical Provider, MD  lisinopril-hydrochlorothiazide (PRINZIDE,ZESTORETIC) 10-12.5 MG per tablet Take 1 tablet by mouth daily. 02/18/14   Historical Provider, MD  LORazepam (ATIVAN) 0.5 MG tablet Take 1 tablet (0.5 mg total) by mouth 2 (two) times daily  as needed for anxiety. Patient taking differently: Take 0.5 mg by mouth daily.  01/08/14   Wardell Honour, MD  metformin (FORTAMET) 500 MG (OSM) 24 hr tablet Take 1,000 mg by mouth 2 (two) times daily with a meal.     Historical Provider, MD  Multiple Vitamin (MULTIVITAMIN) capsule Take 1 capsule by mouth 2 (two) times daily.     Historical Provider, MD  Omega-3 1000 MG CAPS Take 3 g by mouth.    Historical Provider, MD  ondansetron (ZOFRAN) 8 MG tablet Take 8 mg by mouth every 8 (eight) hours as needed for nausea or vomiting.     Historical Provider, MD  OVER THE COUNTER MEDICATION Take 1 packet by mouth 2 (two) times daily. Nutralite multi  Vitamins    Historical Provider, MD  OVER THE COUNTER MEDICATION Take 1 tablet by mouth 2 (two) times daily. Glucose Health Cromium Piccolate    Historical Provider, MD  oxyCODONE-acetaminophen (PERCOCET/ROXICET) 5-325 MG per tablet Take 2 tablets by mouth every 4 (four) hours as needed for severe pain. 03/27/14   Kaitlyn Szekalski, PA-C  predniSONE (DELTASONE) 1 MG tablet Take 7 mg by mouth daily. 02/11/14   Historical Provider, MD  PROAIR HFA 108 (90 BASE) MCG/ACT inhaler Inhale 2 puffs into the lungs every 4 (four) hours as needed for shortness of breath.  11/21/12   Historical Provider, MD  Probiotic Product (PROBIOTIC DAILY PO) Take 1  tablet by mouth daily.     Historical Provider, MD  promethazine (PHENERGAN) 25 MG tablet Take 12.5 mg by mouth every 6 (six) hours as needed for nausea or vomiting.     Historical Provider, MD  ranitidine (ZANTAC) 150 MG tablet Take 150 mg by mouth 2 (two) times daily.  11/29/13   Historical Provider, MD  rOPINIRole (REQUIP) 0.25 MG tablet TAKE ONE TABLET BY MOUTH NIGHTLY AT BEDTIME  04/09/14   Wardell Honour, MD  silver sulfADIAZINE (SILVADENE) 1 % cream Apply 1 application topically daily. Apply dime size thickness to affected area daily 09/03/13   Thao P Le, DO  traMADol (ULTRAM) 50 MG tablet Take 1 tablet (50 mg total) by mouth every 6 (six) hours as needed. Patient taking differently: Take 50 mg by mouth every 6 (six) hours as needed for moderate pain or severe pain.  02/11/14   Wardell Honour, MD   BP 116/66 mmHg  Pulse 112  Temp(Src) 99 F (37.2 C) (Oral)  Resp 18  Ht 5\' 5"  (1.651 m)  Wt 272 lb (123.378 kg)  BMI 45.26 kg/m2  SpO2 98%  LMP 01/17/2012 Physical Exam  Constitutional: She is oriented to person, place, and time. She appears well-developed and well-nourished. No distress.  HENT:  Head: Normocephalic and atraumatic.  Right Ear: Hearing normal.  Left Ear: Hearing normal.  Nose: Nose normal.  Mouth/Throat: Oropharynx is clear and moist and mucous membranes are normal.  Eyes: Conjunctivae and EOM are normal. Pupils are equal, round, and reactive to light.  Neck: Normal range of motion. Neck supple. No tracheal deviation present.  Cardiovascular: Normal rate, regular rhythm, S1 normal and S2 normal.  Exam reveals no gallop and no friction rub.   No murmur heard. Pulmonary/Chest: Effort normal and breath sounds normal. No respiratory distress. She exhibits no tenderness.  Abdominal: Soft. Normal appearance and bowel sounds are normal. There is no hepatosplenomegaly. There is no tenderness. There is no rebound, no guarding, no tenderness at McBurney's point and negative Murphy's  sign. No hernia.  Musculoskeletal: Normal range of motion.  The right leg appears grossly normal with slight discoloration to the shin area. There is no warmth or erythema. DP pulses are easily palpable. No calf tenderness and Homan's sign is absent.   Neurological: She is alert and oriented to person, place, and time. She has normal strength. No cranial nerve deficit or sensory deficit. Coordination normal. GCS eye subscore is 4. GCS verbal subscore is 5. GCS motor subscore is 6.  Skin: Skin is warm, dry and intact. No rash noted. No cyanosis or erythema.  Psychiatric: She has a normal mood and affect. Her speech is normal and behavior is normal. Thought content normal.  Nursing note and vitals reviewed.   ED Course  Procedures (including critical care time)  DIAGNOSTIC STUDIES: Oxygen Saturation is 98% on RA, normal by my interpretation.    COORDINATION OF CARE: 8:22 PM - Discussed treatment plan with pt at bedside which includes diagnostic tests, and pt agreed to plan.  Labs Review Labs Reviewed  CBC WITH DIFFERENTIAL/PLATELET - Abnormal; Notable for the following:    WBC 11.2 (*)    Eosinophils Relative 10 (*)    Eosinophils Absolute 1.1 (*)    All other components within normal limits  BASIC METABOLIC PANEL - Abnormal; Notable for the following:    Glucose, Bld 126 (*)    All other components within normal limits  URINALYSIS, ROUTINE W REFLEX MICROSCOPIC - Abnormal; Notable for the following:    Leukocytes, UA SMALL (*)    All other components within normal limits  URINE MICROSCOPIC-ADD ON  I-STAT CG4 LACTIC ACID, ED   Imaging Review No results found.   MDM   Final diagnoses:  None     Patient with history of recurrent LE cellulitis.  She presents with complaints of malaise and "something brewing".  She feels as though she is getting ready to develop another episode of cellulitis.  The leg is not erythematous or warm and she insists that this is how episodes of  cellulitis start.  Her wbc is 11, but lactate and vitals are not suggestive of a septic condition.  She will be treated with iv followed by po clindamycin.  She is to follow up with her ID doctor in the next day (Dr. Baxter Flattery).  I personally performed the services described in this documentation, which was scribed in my presence. The recorded information has been reviewed and is accurate.      Veryl Speak, MD 05/05/14 [phone]

## 2014-05-04 NOTE — ED Notes (Signed)
Pain in her right lower leg. Hx of recurrent cellulitis. Leg does not look red but she is trying to keep it in control.

## 2014-05-05 ENCOUNTER — Telehealth: Payer: Self-pay | Admitting: *Deleted

## 2014-05-05 NOTE — Telephone Encounter (Signed)
Patient called regarding the antibiotic she was given at the ED yesterday; clindamycin 300 mg four times daily. She wanted to know if Dr. Baxter Flattery was in agreement. Spoke to MD and she agrees with this treatment. She also advised that the patient be evaluated by her PCP, Dr. Tamala Julian if she has further concerns. Relayed this information to the patient and she is in agreement and will start the antibiotic today. Kelli Allen

## 2014-05-06 ENCOUNTER — Telehealth: Payer: Self-pay | Admitting: *Deleted

## 2014-05-06 ENCOUNTER — Encounter: Payer: BLUE CROSS/BLUE SHIELD | Admitting: Physical Therapy

## 2014-05-06 NOTE — Telephone Encounter (Signed)
Patient called advised that the doctor called her and she missed the call. She advised she is still not feeling well and her leg doesn't look or feel any different than it did when she went to the ED. She advised the stinging and burning are still present and she just does not feel well. She also advised her right knee hurts very bad. Advised she is trying to get her PCP to give her a spinal injection (steroid) for the pain and wanted the doctor to know that as well. Advised her will send a message to the doctor and call her back once she calls back.

## 2014-05-07 ENCOUNTER — Other Ambulatory Visit: Payer: Self-pay | Admitting: Orthopedic Surgery

## 2014-05-07 ENCOUNTER — Ambulatory Visit (INDEPENDENT_AMBULATORY_CARE_PROVIDER_SITE_OTHER): Payer: BLUE CROSS/BLUE SHIELD | Admitting: Family Medicine

## 2014-05-07 ENCOUNTER — Encounter: Payer: Self-pay | Admitting: Family Medicine

## 2014-05-07 VITALS — BP 121/75 | HR 107 | Temp 98.3°F | Resp 18 | Ht 65.0 in | Wt 262.0 lb

## 2014-05-07 DIAGNOSIS — L03119 Cellulitis of unspecified part of limb: Secondary | ICD-10-CM | POA: Diagnosis not present

## 2014-05-07 DIAGNOSIS — M79601 Pain in right arm: Secondary | ICD-10-CM | POA: Diagnosis not present

## 2014-05-07 DIAGNOSIS — G8929 Other chronic pain: Secondary | ICD-10-CM | POA: Diagnosis not present

## 2014-05-07 DIAGNOSIS — M79604 Pain in right leg: Secondary | ICD-10-CM

## 2014-05-07 DIAGNOSIS — M5126 Other intervertebral disc displacement, lumbar region: Secondary | ICD-10-CM

## 2014-05-07 NOTE — Patient Instructions (Signed)
I have placed an order for CT of right lower leg; the insurer still requires a call for authorization but we will try to get it scheduled before May 10, 2014.  Continue taking your antibiotic.

## 2014-05-07 NOTE — Telephone Encounter (Signed)
Will call her back to see how cellulitis is doing

## 2014-05-08 ENCOUNTER — Ambulatory Visit
Admission: RE | Admit: 2014-05-08 | Discharge: 2014-05-08 | Disposition: A | Discharge status: Home / Self Care | Payer: BLUE CROSS/BLUE SHIELD | Source: Ambulatory Visit | Attending: Family Medicine | Admitting: Family Medicine

## 2014-05-08 ENCOUNTER — Encounter: Payer: BLUE CROSS/BLUE SHIELD | Admitting: Physical Therapy

## 2014-05-08 ENCOUNTER — Other Ambulatory Visit: Payer: BLUE CROSS/BLUE SHIELD

## 2014-05-08 ENCOUNTER — Ambulatory Visit
Admission: RE | Admit: 2014-05-08 | Discharge: 2014-05-08 | Disposition: A | Discharge status: Home / Self Care | Payer: BLUE CROSS/BLUE SHIELD | Source: Ambulatory Visit | Attending: Orthopedic Surgery | Admitting: Orthopedic Surgery

## 2014-05-08 DIAGNOSIS — G8929 Other chronic pain: Secondary | ICD-10-CM

## 2014-05-08 DIAGNOSIS — L03119 Cellulitis of unspecified part of limb: Secondary | ICD-10-CM

## 2014-05-08 DIAGNOSIS — M79601 Pain in right arm: Principal | ICD-10-CM

## 2014-05-08 DIAGNOSIS — M5126 Other intervertebral disc displacement, lumbar region: Secondary | ICD-10-CM

## 2014-05-08 DIAGNOSIS — M79604 Pain in right leg: Secondary | ICD-10-CM

## 2014-05-08 MED ORDER — IOHEXOL 300 MG/ML  SOLN
125.0000 mL | Freq: Once | INTRAMUSCULAR | Status: AC | PRN
  Administered 2014-05-08: 125 mL via INTRAVENOUS

## 2014-05-08 MED ORDER — IOHEXOL 180 MG/ML  SOLN
1.0000 mL | Freq: Once | INTRAMUSCULAR | Status: AC | PRN
  Administered 2014-05-08: 1 mL via EPIDURAL

## 2014-05-08 MED ORDER — METHYLPREDNISOLONE ACETATE 40 MG/ML INJ SUSP (RADIOLOG
120.0000 mg | Freq: Once | INTRAMUSCULAR | Status: AC
  Administered 2014-05-08: 120 mg via EPIDURAL

## 2014-05-08 NOTE — Discharge Instructions (Signed)

## 2014-05-11 ENCOUNTER — Other Ambulatory Visit: Payer: Self-pay | Admitting: Physician Assistant

## 2014-05-12 NOTE — Progress Notes (Signed)
Subjective:    Patient ID: Kelli Allen, female    DOB: 1975/03/07, 38 y.o.   MRN: [id number]  HPI This 39 y.o female is a pt followed by Dr. Zannie Cove, who is here today for follow-up of treatment for cellulitis in ED on 05/04/2014. Pt has hx of recurrent cellulitis treated on at least 3 previous occasions (hospitalization in Feb 2016). She has a hx of "blood poisoning". Clindamycin 300 mg qid was prescribed in ED; pt is compliant w/ medication w/o adverse effects. She c/o persistent discoloration and tenderness of R shin and calf.  Dopplers studies have been negative for DVT in the past on 2 occasions.  Pt is currently being evaluated for for LBP and chronic R knee pain; she is being scheduled for  epidural procedure; March xray of knee was remarkable for mild patellofemoral OA.  Pt has Cushing's DiseaseType II DM, managed at the [facility]; pt reports last A1c < 8%. She has Diabetic polyneuropathy but it is not clear if R leg pain is related to this disorder.   Patient Active Problem List   Diagnosis Date Noted  . Osteopenia 04/24/2014  . Cellulitis of right lower extremity   . Hyponatremia   . Blood poisoning   . Cellulitis 03/02/2014  . Type 2 diabetes mellitus with diabetic polyneuropathy 01/13/2014  . Sinus tachycardia by electrocardiogram 01/01/2014  . Disease of pituitary gland 11/25/2013  . Cushing's disease 10/15/2013  . ACTH excess, central 10/15/2013  . Endometrial cancer 07/03/2012  . Elevated cholesterol with high triglycerides 03/02/2011  . Cellulitis, leg 02/26/2011  . Diabetes mellitus type 2, uncontrolled, without complications 19/75/8832  . HTN (hypertension) 02/26/2011  . POLYCYSTIC OVARY 03/08/2006  . Obesity 03/08/2006  . ASTHMA, UNSPECIFIED 03/08/2006  . 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 Authorizing Provider  albuterol (PROVENTIL) (2.5 MG/3ML) 0.083%  nebulizer solution Take 2.5 mg by nebulization every 6 (six) hours as needed for wheezing or shortness of breath.   Yes Historical Provider, MD  atorvastatin (LIPITOR) 10 MG tablet Take 1 tablet (10 mg total) by mouth daily at 6 PM. 10/15/13  Yes Wardell Honour, MD  BAYER CONTOUR TEST test strip  12/12/13  Yes Historical Provider, MD  clindamycin (CLEOCIN) 300 MG capsule Take 1 capsule (300 mg total) by mouth 4 (four) times daily. X 7 days 05/04/14  Yes Veryl Speak, MD  cyclobenzaprine (FLEXERIL) 10 MG tablet Take 1 tablet (10 mg total) by mouth 2 (two) times daily as needed for muscle spasms. 03/27/14  Yes Kaitlyn Szekalski, PA-C  diclofenac sodium (VOLTAREN) 1 % GEL Apply 4 g topically 4 (four) times daily. 03/11/14  Yes Wardell Honour, MD  Ergocalciferol (VITAMIN D2) 2000 UNITS TABS Take 1 tablet by mouth.   Yes Historical Provider, MD  FLUoxetine (PROZAC) 20 MG tablet Take 1 tablet (20 mg total) by mouth daily. 01/08/14  Yes Wardell Honour, MD  gabapentin (NEURONTIN) 300 MG capsule Take 900 mg by mouth at bedtime.   Yes Historical Provider, MD  hydrocortisone (ANUSOL-HC) 25 MG suppository Place 1 suppository (25 mg total) rectally 2 (two) times daily. Patient taking differently: Place 25 mg rectally daily as needed for hemorrhoids.  02/13/14  Yes Wardell Honour, MD  hydrocortisone 2.5 % ointment Apply topically 2 (two) times daily. Patient taking differently: Apply 1 application topically daily as needed (inflammation, itching).  01/08/14  Yes Wardell Honour, MD  insulin aspart (NOVOLOG FLEXPEN) 100 UNIT/ML FlexPen Inject 22 units with a correction 1:50 > 150 three times daily before meals 10/10/13  Yes Historical Provider, MD  Insulin Glargine (LANTUS SOLOSTAR) 100 UNIT/ML Solostar Pen Inject 80 Units into the skin at bedtime.  09/25/13  Yes Historical Provider, MD  Liraglutide (VICTOZA) 18 MG/3ML SOPN Inject 1.8 mg into the skin daily.  10/17/13  Yes Historical Provider, MD    lisinopril-hydrochlorothiazide (PRINZIDE,ZESTORETIC) 10-12.5 MG per tablet Take 1 tablet by mouth daily. 02/18/14  Yes Historical Provider, MD  LORazepam (ATIVAN) 0.5 MG tablet Take 1 tablet (0.5 mg total) by mouth 2 (two) times daily as needed for anxiety. Patient taking differently: Take 0.5 mg by mouth daily.  01/08/14  Yes Wardell Honour, MD  metformin (FORTAMET) 500 MG (OSM) 24 hr tablet Take 1,000 mg by mouth 2 (two) times daily with a meal.    Yes Historical Provider, MD  Multiple Vitamin (MULTIVITAMIN) capsule Take 1 capsule by mouth 2 (two) times daily.    Yes Historical Provider, MD  Omega-3 1000 MG CAPS Take 3 g by mouth.   Yes Historical Provider, MD  ondansetron (ZOFRAN) 8 MG tablet Take 8 mg by mouth every 8 (eight) hours as needed for nausea or vomiting.    Yes Historical Provider, MD  OVER THE COUNTER MEDICATION Take 1 packet by mouth 2 (two) times daily. Nutralite multi  Vitamins   Yes Historical Provider, MD  OVER THE COUNTER MEDICATION Take 1 tablet by mouth 2 (two) times daily. Glucose Health Cromium Piccolate   Yes Historical Provider, MD  oxyCODONE-acetaminophen (PERCOCET/ROXICET) 5-325 MG per tablet Take 2 tablets by mouth every 4 (four) hours as needed for severe pain. 03/27/14  Yes Kaitlyn Szekalski, PA-C  predniSONE (DELTASONE) 1 MG tablet Take 7 mg by mouth daily. 02/11/14  Yes Historical Provider, MD  PROAIR HFA 108 (90 BASE) MCG/ACT inhaler Inhale 2 puffs into the lungs every 4 (four) hours as needed for shortness of breath.  11/21/12  Yes Historical Provider, MD  Probiotic Product (PROBIOTIC DAILY PO) Take 1 tablet by mouth daily.    Yes Historical Provider, MD  promethazine (PHENERGAN) 25 MG tablet Take 12.5 mg by mouth every 6 (six) hours as needed for nausea or vomiting.    Yes Historical Provider, MD  ranitidine (ZANTAC) 150 MG tablet Take 150 mg by mouth 2 (two) times daily.  11/29/13  Yes Historical Provider, MD  rOPINIRole (REQUIP) 0.25 MG tablet TAKE ONE TABLET BY  MOUTH NIGHTLY AT BEDTIME  04/09/14  Yes Wardell Honour, MD  silver sulfADIAZINE (SILVADENE) 1 % cream Apply 1 application topically daily. Apply dime size thickness to affected area daily 09/03/13  Yes Thao P Le, DO  traMADol (ULTRAM) 50 MG tablet Take 1 tablet (50 mg total) by mouth every 6 (six) hours as needed. Patient taking differently: Take 50 mg by mouth every 6 (six) hours as needed for moderate pain or severe pain.  02/11/14  Yes Wardell Honour, MD  labetalol (NORMODYNE) 200 MG tablet TAKE ONE TABLET BY MOUTH TWICE DAILY.  "OV NEEDED FOR ADDITIONAL REFILLS" 05/12/14   Fara Chute, PA-C    Past Surgical History  Procedure Laterality Date  . Hysteroscopy w/d&c  01/24/2012    Procedure: DILATATION AND CURETTAGE /HYSTEROSCOPY;  Surgeon: Betsy Coder, MD;  Location: [facility];  Service: Gynecology;  Laterality: N/A;  with vulvar biopsies   . Intrauterine device (iud) insertion  01/24/2012    Procedure: INTRAUTERINE DEVICE (  IUD) INSERTION;  Surgeon: Betsy Coder, MD;  Location: [facility];  Service: Gynecology;  Laterality: N/A;  . Dilation and curettage of uterus    . Robotic assisted total hysterectomy with bilateral salpingo oopherectomy Bilateral 07/02/2012    Procedure: ROBOTIC ASSISTED TOTAL HYSTERECTOMY WITH BILATERAL SALPINGO OOPHORECTOMY ;  Surgeon: Imagene Gurney A. Alycia Rossetti, MD;  Location: [facility];  Service: Gynecology;  Laterality: Bilateral;  . Abdominal hysterectomy    . Transsphenoid hypophysectomy  11/26/2013    ACTH secreting pituitary lesion causing Cushing's; [facility]; Angelene Giovanni, MD    [facility] H[facility] reviewed.   Review of Systems  Constitutional: Negative for fever, chills, diaphoresis, appetite change and fatigue.  Respiratory: Negative for cough, chest tightness and shortness of breath.   Cardiovascular: Negative for chest pain and leg swelling.  Musculoskeletal: Positive for back pain and arthralgias. Negative for gait problem.  Skin: Positive for color change and rash.         Chronic skin changes due to chronic cellulitis.  Neurological: Negative.   Psychiatric/Behavioral: Negative.        Objective:   Physical Exam  Constitutional: She is oriented to person, place, and time. She appears well-developed and well-nourished. No distress.  HENT:  Head: Normocephalic and atraumatic.  Right Ear: External ear normal.  Left Ear: External ear normal.  Nose: Nose normal.  Mouth/Throat: Oropharynx is clear and moist.  Eyes: Conjunctivae and EOM are normal. No scleral icterus.  Cardiovascular: Normal rate and regular rhythm.   Pulmonary/Chest: Effort normal. No respiratory distress.  Musculoskeletal: Normal range of motion.       Right lower leg: She exhibits tenderness. She exhibits no edema and no deformity.  R anterior mid-shin >> brawny discoloration; trace edema. Moderate palpation of tibia/ calf >>  Tenderness.  Neurological: She is alert and oriented to person, place, and time. No cranial nerve deficit. Coordination normal.  Skin: Skin is warm and dry. Rash noted. She is not diaphoretic.  Nursing note and vitals reviewed.      Assessment & Plan:  Chronic leg pain, right - R/O soft tissue infection. Plan: CT Tibia Fibula Right W Contrast  Recurrent cellulitis of lower leg - Continue clindamycin. Plan: CT Tibia Fibula Right W Contrast  Musculoskeletal leg pain, right - Plan: CT Tibia Fibula Right W Contrast

## 2014-05-13 ENCOUNTER — Ambulatory Visit (INDEPENDENT_AMBULATORY_CARE_PROVIDER_SITE_OTHER): Payer: BLUE CROSS/BLUE SHIELD | Admitting: Physical Therapy

## 2014-05-13 ENCOUNTER — Encounter: Payer: Self-pay | Admitting: Physical Therapy

## 2014-05-13 DIAGNOSIS — R531 Weakness: Secondary | ICD-10-CM

## 2014-05-13 DIAGNOSIS — R6889 Other general symptoms and signs: Secondary | ICD-10-CM

## 2014-05-13 DIAGNOSIS — R52 Pain, unspecified: Secondary | ICD-10-CM

## 2014-05-13 NOTE — Therapy (Signed)
[facility] [address] [place], [place], [address] Phone: [phone]   Fax:  [phone]  Physical Therapy Treatment  Patient Details  Name: Kelli Allen MRN: [id number] Date of Birth: [birth year] Referring Provider:  Janalee Dane, PA-C  Encounter Date: 05/13/2014      PT End of Session - 05/13/14 0707    Visit Number 4   Number of Visits 12   Date for PT Re-Evaluation 06/10/14   PT Start Time 0706   PT Stop Time 0753   PT Time Calculation (min) 47 min      Past Medical History  Diagnosis Date  . Cellulitis of lower leg     x3 on right leg  . Hypertension   . Irregular menstrual bleeding   . PCOS (polycystic ovarian syndrome)   . Tachycardia   . Abnormal Pap smear 2000  . Endometrial ca 12/20/2011  . Asthma     no issues in years  . Family history of anesthesia complication     sister had hard time waking up and problems breathing  . Cushing's syndrome 09/25/2013    MRI done on 10/02/2013  . 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 Diabetes mellitus   . Anxiety   . Neuromuscular disorder     DIABETIC NEUROPATHY    Past Surgical History  Procedure Laterality Date  . Hysteroscopy w/d&c  01/24/2012    Procedure: DILATATION AND CURETTAGE /HYSTEROSCOPY;  Surgeon: Michael Litter, MD;  Location: [facility];  Service: Gynecology;  Laterality: N/A;  with vulvar biopsies   . Intrauterine device (iud) insertion  01/24/2012    Procedure: INTRAUTERINE DEVICE (IUD) INSERTION;  Surgeon: Michael Litter, MD;  Location: [facility];  Service: Gynecology;  Laterality: N/A;  . Dilation and curettage of uterus    . Robotic assisted total hysterectomy with bilateral salpingo oopherectomy Bilateral 07/02/2012    Procedure: ROBOTIC ASSISTED TOTAL HYSTERECTOMY WITH BILATERAL SALPINGO OOPHORECTOMY ;  Surgeon: Rejeana Brock A. Duard Brady, MD;  Location: [facility];  Service:  Gynecology;  Laterality: Bilateral;  . Abdominal hysterectomy    . Transsphenoid hypophysectomy  11/26/2013    ACTH secreting pituitary lesion causing Cushing's; [facility]; Brooke Pace, MD    There were no vitals filed for this visit.  Visit Diagnosis:  Pain, generalized  Weakness generalized  Activity intolerance      Subjective Assessment - 05/13/14 0708    Subjective Pt had an epidural shot in her back on Friday so overall she feels better however her knee still hurts.  Had her first massage yesterday and she felt great after that. Still taking prednisone. Had an MRI since her last visit.    Diagnostic tests MRI was clear and she doesn't need to see neuro MD for 18 months now.  And he cleared her to have the epidural spinal injection.   Currently in Pain? Yes   Pain Location Knee   Pain Orientation Right   Pain Descriptors / Indicators Dull   Pain Type Chronic pain   Pain Radiating Towards LE cramps are continuing, will have them during the day   Pain Frequency Intermittent   Aggravating Factors  moving, weight bearing   Pain Relieving Factors massage, will go again in 2 wks            [facility] PT Assessment - 05/13/14 0001    Assessment   Medical Diagnosis lumbar stenosis   Onset Date 01/28/14  Strength   Right Hip Flexion 5/5   Right Knee Flexion --  5-/5   Right Knee Extension 4+/5                     [facility] Adult PT Treatment/Exercise - 05/13/14 0001    Lumbar Exercises: Aerobic   Stationary Bike Nustep L5 x 5'   Lumbar Exercises: Supine   Ab Set 10 reps;5 seconds  with isometric hip flex in hooklying   Bridge --  3x10   Straight Leg Raise --  3x10 with abd bracing   Lumbar Exercises: Sidelying   Clam --  30 reps, bilat.   Knee/Hip Exercises: Standing   Step Down 3 sets;10 reps;Step Height: 4";Right   Modalities   Modalities Moist Heat   Moist Heat Therapy   Number Minutes Moist Heat 15 Minutes   Moist Heat Location --  Rt knee  and lumbar                  PT Short Term Goals - 05/13/14 0726    PT SHORT TERM GOAL #1   Title I with initial HEP   Status Achieved   PT SHORT TERM GOAL #2   Title increase strength of Rt hip flexors =/> 5-/5   Status Achieved   PT SHORT TERM GOAL #3   Title decrease pain =/> 25% in the Rt knee   Status On-going           PT Long Term Goals - 05/13/14 0727    PT LONG TERM GOAL #1   Title I with advanced HEP   Status On-going   PT LONG TERM GOAL #2   Title ambulate up/down stairs with reciprocal gait and no increase in knee pain   Status On-going   PT LONG TERM GOAL #3   Title improve FOTO =/< 34% limited   Status On-going   PT LONG TERM GOAL #4   Title stand and bake/cook without pain reprocussions the next day    Status On-going               Plan - 05/13/14 0739    Clinical Impression Statement Pt had some relief with her epidural and massage in the last week.  She has also met some of her STGs for PT.  Discussed trying to go to the gym and using the recumbent bike and treadmill.  Continues to have weakness in her core and Rt knee and  pain   Pt will benefit from skilled therapeutic intervention in order to improve on the following deficits Impaired flexibility;Increased edema;Postural dysfunction;Pain;Decreased strength   Rehab Potential Good   PT Frequency 2x / week   PT Duration 6 weeks   PT Treatment/Interventions Moist Heat;Therapeutic exercise;Patient/family education   Consulted and Agree with Plan of Care Patient        Problem List Patient Active Problem List   Diagnosis Date Noted  . Osteopenia 04/24/2014  . Cellulitis of right lower extremity   . Hyponatremia   . Blood poisoning   . Cellulitis 03/02/2014  . Type 2 diabetes mellitus with diabetic polyneuropathy 01/13/2014  . Sinus tachycardia by electrocardiogram 01/01/2014  . Disease of pituitary gland 11/25/2013  . Cushing's disease 10/15/2013  . ACTH excess, central  10/15/2013  . Endometrial cancer 07/03/2012  . Elevated cholesterol with high triglycerides 03/02/2011  . Cellulitis, leg 02/26/2011  . Diabetes mellitus type 2, uncontrolled, without complications 78/58/8502  . HTN (hypertension) 02/26/2011  . POLYCYSTIC  OVARY 03/08/2006  . Obesity 03/08/2006  . ASTHMA, UNSPECIFIED 03/08/2006  . Deficiency, disaccharidase intestinal 03/08/2006    Jeral Pinch PT 05/13/2014, 7:42 AM  [facility] [address] [place], [place], [address] Phone: [phone]   Fax:  [phone]

## 2014-05-14 ENCOUNTER — Ambulatory Visit (INDEPENDENT_AMBULATORY_CARE_PROVIDER_SITE_OTHER): Payer: BLUE CROSS/BLUE SHIELD | Admitting: Family Medicine

## 2014-05-14 ENCOUNTER — Other Ambulatory Visit: Payer: Self-pay

## 2014-05-14 VITALS — BP 120/72 | HR 84 | Temp 97.7°F | Resp 16 | Ht 65.0 in | Wt 264.5 lb

## 2014-05-14 DIAGNOSIS — F4323 Adjustment disorder with mixed anxiety and depressed mood: Secondary | ICD-10-CM

## 2014-05-14 DIAGNOSIS — E240 Pituitary-dependent Cushing's disease: Secondary | ICD-10-CM

## 2014-05-14 DIAGNOSIS — J4520 Mild intermittent asthma, uncomplicated: Secondary | ICD-10-CM | POA: Diagnosis not present

## 2014-05-14 DIAGNOSIS — I10 Essential (primary) hypertension: Secondary | ICD-10-CM

## 2014-05-14 DIAGNOSIS — E669 Obesity, unspecified: Secondary | ICD-10-CM | POA: Diagnosis not present

## 2014-05-14 DIAGNOSIS — L03115 Cellulitis of right lower limb: Secondary | ICD-10-CM

## 2014-05-14 DIAGNOSIS — M5441 Lumbago with sciatica, right side: Secondary | ICD-10-CM

## 2014-05-14 DIAGNOSIS — E1142 Type 2 diabetes mellitus with diabetic polyneuropathy: Secondary | ICD-10-CM | POA: Diagnosis not present

## 2014-05-14 MED ORDER — ALBUTEROL SULFATE HFA 108 (90 BASE) MCG/ACT IN AERS: 2.0000 | INHALATION_SPRAY | Freq: Four times a day (QID) | RESPIRATORY_TRACT | Status: AC | PRN

## 2014-05-14 MED ORDER — GABAPENTIN 300 MG PO CAPS: 300.0000 mg | ORAL_CAPSULE | Freq: Three times a day (TID) | ORAL | Status: DC

## 2014-05-14 MED ORDER — FLUOXETINE HCL 20 MG PO TABS: 40.0000 mg | ORAL_TABLET | Freq: Every day | ORAL | Status: DC

## 2014-05-14 NOTE — Telephone Encounter (Signed)
[facility] sent req for Dr Tamala Julian to RF pt's gabapentin. Dr Tamala Julian, it looks like you have not Rxd this for pt before, but it was on her current med list when you saw her last in March. Pended for review.

## 2014-05-14 NOTE — Telephone Encounter (Signed)
Patient presented for evaluation to office.  Rx provided.

## 2014-05-14 NOTE — Progress Notes (Signed)
Subjective:  This chart was scribed for Reginia Forts, MD by Mercy Moore, Medial Scribe. This patient was seen in room 10 and the patient's care was started at 6:56 PM.    Patient ID: Kelli Allen, female    DOB: Feb 01, 1975, 39 y.o.   MRN: [id number] Chief Complaint  Patient presents with  . Medication Refill    05/14/2014  Medication Refill   HPI HPI Comments: Kelli Allen is a 39 y.o. female who presents to the [facility] requesting medication refill of Gabapentin for diabetic peripheral neuropathy.   Patient last seen by Dr. Tamala Julian 03/11/14. Patient saw Dr. Leward Quan, 5/3 due to concerns of recurrent cellulitis. Patient was able to obtain CT scan the following morning which resulted negatively in cellulitis or abscess formation. Patient was cleared for lumbar epidural injection Friday/the following day at [facility]. Patient denies change/improvement in her right knee pain since the epidural treatment. Patient followed by Dr. Percell Miller for her right knee pain. Patient reports back pain and right buttock pain that worsens as the day progresses. Patient reports burning aching pain in her right knee with movement and pain in her right buttocks with standing. Patient reports pain in her right knee with descending stairs; patient denies pain with walking, and riding her bicycle recently. Patient reports physical therapy weekly and chiropractic therapy.   Patient has decreased her steroid dosage to 3mg  from the 10mg . Patient denies relief of pain with an array of attempted pain medications and muscles relaxers. Patient reports relief with massage therapy.   Anxiety:  Patient reports good compliance with medication, good tolerance to medication, and good symptom control.   Patient reports effective treatment with use of Prozac. Patient states that she no longer needs to take her Ativan.   Excessive worry and evening mind racing has greatly improved.  Pt now can get  very emotionally strained from chronic ongoing knee pain.    Recurrent cellulitis:  S/p ID consultation.  Patient shares that she's seen Dr. Graylon Good at ID 4/25 for follow up of her recurrent cellulitis.  No changes to therapy made other than Dr. Graylon Good providing patient with PRN fill abx therapy.    Cushings syndrome: weaning prednisone as tolerated.  Pt really feels that ongoing pain is withdrawal from high prednisone levels prior to resection.  Followed by endocrinology closely every 2-3 months.  Losing weight unintentionally since surgery and feeling well.  DMII: sugars are improving since surgery /trasnssphenoid hypophysectomy.  RLS: well controlled with requip.     Review of Systems  Constitutional: Positive for unexpected weight change. Negative for fever, chills, diaphoresis and fatigue.  Eyes: Negative for visual disturbance.  Respiratory: Negative for cough and shortness of breath.   Cardiovascular: Negative for chest pain, palpitations and leg swelling.  Gastrointestinal: Negative for nausea, vomiting, abdominal pain, diarrhea and constipation.  Endocrine: Negative for cold intolerance, heat intolerance, polydipsia, polyphagia and polyuria.  Musculoskeletal: Positive for myalgias, back pain, arthralgias and gait problem. Negative for joint swelling, neck pain and neck stiffness.  Skin: Negative for color change, pallor, rash and wound.  Neurological: Negative for dizziness, tremors, seizures, syncope, facial asymmetry, speech difficulty, weakness, light-headedness, numbness and headaches.  Psychiatric/Behavioral: Positive for dysphoric mood. Negative for suicidal ideas, sleep disturbance and self-injury. The patient is nervous/anxious.     Past Medical History  Diagnosis Date  . Cellulitis of lower leg     x3 on right leg  . Hypertension   . Irregular menstrual bleeding   .  PCOS (polycystic ovarian syndrome)   . Tachycardia   . Abnormal Pap smear 2000  . Endometrial ca  12/20/2011  . Asthma     no issues in years  . Family history of anesthesia complication     sister had hard time waking up and problems breathing  . Cushing's syndrome 09/25/2013    MRI done on 10/02/2013  . 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 Diabetes mellitus   . Anxiety   . Neuromuscular disorder     DIABETIC NEUROPATHY   Past Surgical History  Procedure Laterality Date  . Hysteroscopy w/d&c  01/24/2012    Procedure: DILATATION AND CURETTAGE /HYSTEROSCOPY;  Surgeon: Betsy Coder, MD;  Location: [facility];  Service: Gynecology;  Laterality: N/A;  with vulvar biopsies   . Intrauterine device (iud) insertion  01/24/2012    Procedure: INTRAUTERINE DEVICE (IUD) INSERTION;  Surgeon: Betsy Coder, MD;  Location: [place] [facility];  Service: Gynecology;  Laterality: N/A;  . Dilation and curettage of uterus    . Robotic assisted total hysterectomy with bilateral salpingo oopherectomy Bilateral 07/02/2012    Procedure: ROBOTIC ASSISTED TOTAL HYSTERECTOMY WITH BILATERAL SALPINGO OOPHORECTOMY ;  Surgeon: Imagene Gurney A. Alycia Rossetti, MD;  Location: [facility];  Service: Gynecology;  Laterality: Bilateral;  . Abdominal hysterectomy    . Transsphenoid hypophysectomy  11/26/2013    ACTH secreting pituitary lesion causing Cushing's; [facility]; Angelene Giovanni, MD   Allergies  Allergen Reactions  . Antibacterial Hand Soap [Triclosan] Other (See Comments) and Rash    Dry, itchy patches on skin        Objective:    Triage Vitals: BP 120/72 mmHg  Pulse 84  Temp(Src) 97.7 F (36.5 C) (Oral)  Resp 16  Ht 5\' 5"  (1.651 m)  Wt 264 lb 8 oz (119.976 kg)  BMI 44.01 kg/m2  SpO2 98%  LMP 01/17/2012 Physical Exam  Constitutional: She is oriented to person, place, and time. She appears well-developed and well-nourished. No distress.  obese  HENT:  Head: Normocephalic and atraumatic.  Right Ear: External ear normal.  Left Ear: External ear normal.    Nose: Nose normal.  Mouth/Throat: Oropharynx is clear and moist.  Eyes: Conjunctivae and EOM are normal. Pupils are equal, round, and reactive to light.  Neck: Normal range of motion. Neck supple. Carotid bruit is not present. No tracheal deviation present. No thyromegaly present.  Cardiovascular: Normal rate, regular rhythm, normal heart sounds and intact distal pulses.  Exam reveals no gallop and no friction rub.   No murmur heard. Trace to 1+ pitting edema BLE; chronic skin changes RLE without erythema or scaling.  Non-tender to RLE.  Pulmonary/Chest: Effort normal and breath sounds normal. No respiratory distress. She has no wheezes. She has no rales.  Abdominal: Soft. Bowel sounds are normal. She exhibits no distension and no mass. There is no tenderness. There is no rebound and no guarding.  Musculoskeletal: Normal range of motion.  Lymphadenopathy:    She has no cervical adenopathy.  Neurological: She is alert and oriented to person, place, and time. No cranial nerve deficit.  Skin: Skin is warm and dry. No rash noted. She is not diaphoretic. No erythema. No pallor.  Psychiatric: She has a normal mood and affect. Her behavior is normal.  Nursing note and vitals reviewed.  Results for orders placed or performed during the hospital encounter of 05/04/14  CBC with Differential  Result Value Ref Range   WBC  11.2 (H) 4.0 - 10.5 K/uL   RBC 3.90 3.87 - 5.11 MIL/uL   Hemoglobin 12.2 12.0 - 15.0 g/dL   HCT 36.7 36.0 - 46.0 %   MCV 94.1 78.0 - 100.0 fL   MCH 31.3 26.0 - 34.0 pg   MCHC 33.2 30.0 - 36.0 g/dL   RDW 12.5 11.5 - 15.5 %   Platelets 357 150 - 400 K/uL   Neutrophils Relative % 54 43 - 77 %   Neutro Abs 6.0 1.7 - 7.7 K/uL   Lymphocytes Relative 27 12 - 46 %   Lymphs Abs 3.1 0.7 - 4.0 K/uL   Monocytes Relative 8 3 - 12 %   Monocytes Absolute 0.9 0.1 - 1.0 K/uL   Eosinophils Relative 10 (H) 0 - 5 %   Eosinophils Absolute 1.1 (H) 0.0 - 0.7 K/uL   Basophils Relative 1 0 - 1 %    Basophils Absolute 0.1 0.0 - 0.1 K/uL  Basic metabolic panel  Result Value Ref Range   Sodium 137 135 - 145 mmol/L   Potassium 3.9 3.5 - 5.1 mmol/L   Chloride 101 96 - 112 mmol/L   CO2 26 19 - 32 mmol/L   Glucose, Bld 126 (H) 70 - 99 mg/dL   BUN 18 6 - 23 mg/dL   Creatinine, Ser 0.77 0.50 - 1.10 mg/dL   Calcium 10.1 8.4 - 10.5 mg/dL   GFR calc non Af Amer >90 >90 mL/min   GFR calc Af Amer >90 >90 mL/min   Anion gap 10 5 - 15  Urinalysis, Routine w reflex microscopic  Result Value Ref Range   Color, Urine YELLOW YELLOW   APPearance CLEAR CLEAR   Specific Gravity, Urine 1.017 1.005 - 1.030   pH 5.5 5.0 - 8.0   Glucose, UA NEGATIVE NEGATIVE mg/dL   Hgb urine dipstick NEGATIVE NEGATIVE   Bilirubin Urine NEGATIVE NEGATIVE   Ketones, ur NEGATIVE NEGATIVE mg/dL   Protein, ur NEGATIVE NEGATIVE mg/dL   Urobilinogen, UA 0.2 0.0 - 1.0 mg/dL   Nitrite NEGATIVE NEGATIVE   Leukocytes, UA SMALL (A) NEGATIVE  Urine microscopic-add on  Result Value Ref Range   Squamous Epithelial / LPF RARE RARE   WBC, UA 7-10 <3 WBC/hpf   Bacteria, UA RARE RARE  I-Stat CG4 Lactic Acid, ED  Result Value Ref Range   Lactic Acid, Venous 1.34 0.5 - 2.0 mmol/L       Assessment & Plan:   1. Type 2 diabetes mellitus with diabetic polyneuropathy   2. Adjustment disorder with mixed anxiety and depressed mood   3. Obesity   4. Essential hypertension   5. Cushing's disease   6. Cellulitis of right lower extremity   7. Asthma, mild intermittent, uncomplicated   8. Right-sided low back pain with right-sided sciatica     1. DMII with peripheral neuropathy: controlled; refill of Neurontin provided. 2.  Anxiety and depression: improved yet persistent due to ongoing pain; increase Prozac to 40mg  daily; not using Ativan much at this time. 3.  Obesity: improving since surgery. 4.  HTN: improved with weight loss and treatment of Cushing's syndrome. 5.  Cushings disease: s/p surgical resection transsphenoid  hypophysectomy.  Followed by endocrinology; slowly weaning Prednisone. 6.  RLE cellulitis with recurrence: s/p ID consultation; PRN abx provided. 7.  Asthma: stable; refill of Albuterol provided. 8. Lower back pain with radicular symptoms: s/p lumbar injection with persistent radicular pain; will increase Neurontin to 1-2 tablets tid.  Recommend follow-up with ortho; recommend  pt calling for follow-up appointment.  Meds ordered this encounter  Medications  . FLUoxetine (PROZAC) 20 MG tablet    Sig: Take 2 tablets (40 mg total) by mouth daily.    Dispense:  60 tablet    Refill:  5  . gabapentin (NEURONTIN) 300 MG capsule    Sig: Take 1-2 capsules (300-600 mg total) by mouth 3 (three) times daily.    Dispense:  120 capsule    Refill:  5  . albuterol (PROAIR HFA) 108 (90 BASE) MCG/ACT inhaler    Sig: Inhale 2 puffs into the lungs every 6 (six) hours as needed for shortness of breath.    Dispense:  18 g    Refill:  1    No Follow-up on file.    I personally performed the services described in this documentation, which was scribed in my presence. The recorded information has been reviewed and considered.  Shanique Aslinger Elayne Guerin, M.D. [facility] [address] [place], [place]  [address] [phone] phone [phone] fax

## 2014-05-20 ENCOUNTER — Ambulatory Visit (INDEPENDENT_AMBULATORY_CARE_PROVIDER_SITE_OTHER): Payer: BLUE CROSS/BLUE SHIELD | Admitting: Physical Therapy

## 2014-05-20 DIAGNOSIS — R6889 Other general symptoms and signs: Secondary | ICD-10-CM

## 2014-05-20 DIAGNOSIS — R52 Pain, unspecified: Secondary | ICD-10-CM | POA: Diagnosis not present

## 2014-05-20 DIAGNOSIS — R531 Weakness: Secondary | ICD-10-CM

## 2014-05-20 NOTE — Therapy (Signed)
[place], [place], [address] Phone: [phone]   Fax:  [phone]  Physical Therapy Treatment  Patient Details  Name: Kelli Allen MRN: [id number] Date of Birth: [birth year] Referring Provider:  Lovett Calender, PA-C  Encounter Date: 05/20/2014      PT End of Session - 05/20/14 0708    Visit Number 5   Number of Visits 12   Date for PT Re-Evaluation 06/10/14   PT Start Time 0705   PT Stop Time 0806   PT Time Calculation (min) 61 min   Activity Tolerance Patient tolerated treatment well      Past Medical History  Diagnosis Date  . Cellulitis of lower leg     x3 on right leg  . Hypertension   . Irregular menstrual bleeding   . PCOS (polycystic ovarian syndrome)   . Tachycardia   . Abnormal Pap smear 2000  . Endometrial ca 12/20/2011  . Asthma     no issues in years  . Family history of anesthesia complication     sister had hard time waking up and problems breathing  . Cushing's syndrome 09/25/2013    MRI done on 10/02/2013  . 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 Diabetes mellitus   . Anxiety   . Neuromuscular disorder     DIABETIC NEUROPATHY    Past Surgical History  Procedure Laterality Date  . Hysteroscopy w/d&c  01/24/2012    Procedure: DILATATION AND CURETTAGE /HYSTEROSCOPY;  Surgeon: Betsy Coder, MD;  Location: [facility];  Service: Gynecology;  Laterality: N/A;  with vulvar biopsies   . Intrauterine device (iud) insertion  01/24/2012    Procedure: INTRAUTERINE DEVICE (IUD) INSERTION;  Surgeon: Betsy Coder, MD;  Location: [facility];  Service: Gynecology;  Laterality: N/A;  . Dilation and curettage of uterus    . Robotic assisted total hysterectomy with bilateral salpingo oopherectomy Bilateral 07/02/2012    Procedure: ROBOTIC ASSISTED TOTAL HYSTERECTOMY WITH BILATERAL SALPINGO OOPHORECTOMY ;  Surgeon:  Imagene Gurney A. Alycia Rossetti, MD;  Location: [facility];  Service: Gynecology;  Laterality: Bilateral;  . Abdominal hysterectomy    . Transsphenoid hypophysectomy  11/26/2013    ACTH secreting pituitary lesion causing Cushing's; [facility]; Angelene Giovanni, MD    There were no vitals filed for this visit.  Visit Diagnosis:  Pain, generalized  Weakness generalized  Activity intolerance      Subjective Assessment - 05/20/14 0709    Subjective Pt has been to gym 2x since last visit; not compliant with HEP.  Standing is giving her relief at work. Pt reported MD changed timing of medicine and increased Prozac.    Currently in Pain? Yes   Pain Score 5    Pain Location Knee   Pain Orientation Right   Pain Descriptors / Indicators Aching;Burning            [facility] PT Assessment - 05/20/14 0001    Assessment   Medical Diagnosis lumbar stenosis   Onset Date 01/28/14                     [facility] Adult PT Treatment/Exercise - 05/20/14 0001    Lumbar Exercises: Aerobic   Stationary Bike Nustep L5 x 5'   Lumbar Exercises: Supine   Bridge 20 reps   Lumbar Exercises: Sidelying   Clam 20 reps   Knee/Hip Exercises: Stretches   Passive Hamstring Stretch 2 reps;60 seconds  Rt/ Lt   Quad Stretch 2 reps;30 seconds  each leg   ITB Stretch 60 seconds;2 reps  each leg   Knee/Hip Exercises: Standing   Heel Raises 1 set;10 reps   Side Lunges 20 reps   Step Down 2 sets;10 reps;Right   Modalities   Modalities Ultrasound   Ultrasound   Ultrasound Location Rt Lateral knee    Ultrasound Parameters 3.3 mHz, 100%, 1.1 w/cm2 x 8 min   Ultrasound Goals Pain   Manual Therapy   Manual Therapy Myofascial release;Other (comment)   Myofascial Release to Rt ITB   Other Manual Therapy TPR to Rt TFL and ITB,  dynamic tape to Rt lateral knee                   PT Short Term Goals - 05/13/14 0726    PT SHORT TERM GOAL #1   Title I with initial HEP   Status Achieved   PT SHORT TERM GOAL #2    Title increase strength of Rt hip flexors =/> 5-/5   Status Achieved   PT SHORT TERM GOAL #3   Title decrease pain =/> 25% in the Rt knee   Status On-going           PT Long Term Goals - 05/20/14 0714    PT LONG TERM GOAL #1   Title I with advanced HEP   Time 6   Period Weeks   Status On-going   PT LONG TERM GOAL #2   Title ambulate up/down stairs with reciprocal gait and no increase in knee pain   Time 6   Period Weeks   Status On-going   PT LONG TERM GOAL #3   Title improve FOTO =/< 34% limited   Time 6   Period Weeks   Status On-going   PT LONG TERM GOAL #4   Title stand and bake/cook without pain reprocussions the next day    Time 6   Period Weeks   Status Achieved               Plan - 05/20/14 0723    Clinical Impression Statement Pt is reporting unchanged pain rating in Rt knee, but the frequency of pain occuring has lessened 5-10%. Pt tolerated all exercises this date without increase in pain. Pt has met LTG # 4.   Pt will benefit from skilled therapeutic intervention in order to improve on the following deficits Impaired flexibility;Increased edema;Postural dysfunction;Pain;Decreased strength   Rehab Potential Good   PT Frequency 2x / week   PT Duration 6 weeks   PT Treatment/Interventions Moist Heat;Therapeutic exercise;Patient/family education;Ultrasound   PT Next Visit Plan Continue core and LE strengthening. Modalities PRN.    Consulted and Agree with Plan of Care Patient        Problem List Patient Active Problem List   Diagnosis Date Noted  . Osteopenia 04/24/2014  . Cellulitis of right lower extremity   . Hyponatremia   . Blood poisoning   . Cellulitis 03/02/2014  . Type 2 diabetes mellitus with diabetic polyneuropathy 01/13/2014  . Sinus tachycardia by electrocardiogram 01/01/2014  . Disease of pituitary gland 11/25/2013  . Cushing's disease 10/15/2013  . ACTH excess, central 10/15/2013  . Endometrial cancer 07/03/2012  . Elevated  cholesterol with high triglycerides 03/02/2011  . Cellulitis, leg 02/26/2011  . Diabetes mellitus type 2, uncontrolled, without complications 69/48/5462  . HTN (hypertension) 02/26/2011  . POLYCYSTIC OVARY 03/08/2006  . Obesity 03/08/2006  . ASTHMA, UNSPECIFIED 03/08/2006  .  Deficiency, disaccharidase intestinal 03/08/2006    Kerin Perna, PTA 05/20/2014 8:19 AM  [facility] [place], [place], [address] Phone: [phone]   Fax:  [phone]

## 2014-05-22 ENCOUNTER — Ambulatory Visit (INDEPENDENT_AMBULATORY_CARE_PROVIDER_SITE_OTHER): Payer: BLUE CROSS/BLUE SHIELD | Admitting: Physical Therapy

## 2014-05-22 DIAGNOSIS — R52 Pain, unspecified: Secondary | ICD-10-CM | POA: Diagnosis not present

## 2014-05-22 DIAGNOSIS — R6889 Other general symptoms and signs: Secondary | ICD-10-CM

## 2014-05-22 DIAGNOSIS — R531 Weakness: Secondary | ICD-10-CM | POA: Diagnosis not present

## 2014-05-22 NOTE — Therapy (Signed)
[place] [place], [place], [address] Phone: [phone]   Fax:  [phone]  Physical Therapy Treatment  Patient Details  Name: Kelli Allen MRN: [id number] Date of Birth: September 17, 1975 Referring Provider:  Lovett Calender, PA-C  Encounter Date: 05/22/2014      PT End of Session - 05/22/14 0817    Visit Number 6   Number of Visits 12   Date for PT Re-Evaluation 06/10/14   PT Start Time 0813   PT Stop Time 0845   PT Time Calculation (min) 32 min   Activity Tolerance Patient tolerated treatment well;Patient limited by pain      Past Medical History  Diagnosis Date  . Cellulitis of lower leg     x3 on right leg  . Hypertension   . Irregular menstrual bleeding   . PCOS (polycystic ovarian syndrome)   . Tachycardia   . Abnormal Pap smear 2000  . Endometrial ca 12/20/2011  . Asthma     no issues in years  . Family history of anesthesia complication     sister had hard time waking up and problems breathing  . Cushing's syndrome 09/25/2013    MRI done on 10/02/2013  . 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 Diabetes mellitus   . Anxiety   . Neuromuscular disorder     DIABETIC NEUROPATHY    Past Surgical History  Procedure Laterality Date  . Hysteroscopy w/d&c  01/24/2012    Procedure: DILATATION AND CURETTAGE /HYSTEROSCOPY;  Surgeon: Betsy Coder, MD;  Location: [facility];  Service: Gynecology;  Laterality: N/A;  with vulvar biopsies   . Intrauterine device (iud) insertion  01/24/2012    Procedure: INTRAUTERINE DEVICE (IUD) INSERTION;  Surgeon: Betsy Coder, MD;  Location: [facility];  Service: Gynecology;  Laterality: N/A;  . Dilation and curettage of uterus    . Robotic assisted total hysterectomy with bilateral salpingo oopherectomy Bilateral 07/02/2012    Procedure: ROBOTIC ASSISTED TOTAL HYSTERECTOMY WITH BILATERAL SALPINGO  OOPHORECTOMY ;  Surgeon: Imagene Gurney A. Alycia Rossetti, MD;  Location: [facility];  Service: Gynecology;  Laterality: Bilateral;  . Abdominal hysterectomy    . Transsphenoid hypophysectomy  11/26/2013    ACTH secreting pituitary lesion causing Cushing's; [facility]; Angelene Giovanni, MD    There were no vitals filed for this visit.  Visit Diagnosis:  Pain, generalized  Weakness generalized  Activity intolerance      Subjective Assessment - 05/22/14 0817    Subjective Pt reports dynamic tape felt comforting, but did not change the pain in Rt knee. Pt reports she is tolerating more activity compared to before beginning therapy.    Currently in Pain? Yes   Pain Score 5    Pain Location Knee   Pain Orientation Right   Pain Descriptors / Indicators Aching            [facility] PT Assessment - 05/22/14 0001    Assessment   Medical Diagnosis lumbar stenosis   Onset Date 01/28/14                     [facility] Adult PT Treatment/Exercise - 05/22/14 0001    Lumbar Exercises: Stretches   Lower Trunk Rotation 5 reps;10 seconds   Lumbar Exercises: Aerobic   Stationary Bike Nustep L5 x 5'   Lumbar Exercises: Supine   Bridge 10 reps   Other Supine Lumbar Exercises Trans Abd with knee in/out  x 10    Other Supine Lumbar Exercises Trans abd with marching x 15 (increased LBP), stopped   Knee/Hip Exercises: Machines for Strengthening   Cybex Knee Extension 2 plates: 10 reps x 2 sets    Cybex Leg Press 6 plates: 2 sets of 12    Modalities   Modalities --  Pt declined modalities                  PT Short Term Goals - 05/13/14 0726    PT SHORT TERM GOAL #1   Title I with initial HEP   Status Achieved   PT SHORT TERM GOAL #2   Title increase strength of Rt hip flexors =/> 5-/5   Status Achieved   PT SHORT TERM GOAL #3   Title decrease pain =/> 25% in the Rt knee   Status On-going           PT Long Term Goals - 05/20/14 0714    PT LONG TERM GOAL #1   Title I with advanced HEP    Time 6   Period Weeks   Status On-going   PT LONG TERM GOAL #2   Title ambulate up/down stairs with reciprocal gait and no increase in knee pain   Time 6   Period Weeks   Status On-going   PT LONG TERM GOAL #3   Title improve FOTO =/< 34% limited   Time 6   Period Weeks   Status On-going   PT LONG TERM GOAL #4   Title stand and bake/cook without pain reprocussions the next day    Time 6   Period Weeks   Status Achieved               Plan - 05/22/14 0836    Clinical Impression Statement Pt reported minimal change in Rt knee symptoms with [place] and taping.  She reported LBP with use of leg press. Pt reporting decreased pain in body, now localizing to Rt knee. She notes that her legs are not cramping with ther ex anymore, which is a significant improvement.    Pt will benefit from skilled therapeutic intervention in order to improve on the following deficits Impaired flexibility;Increased edema;Postural dysfunction;Pain;Decreased strength   Rehab Potential Good   PT Frequency 2x / week   PT Duration 6 weeks   PT Treatment/Interventions Moist Heat;Therapeutic exercise;Patient/family education;Ultrasound   PT Next Visit Plan Continue core and LE strengthening. Modalities PRN.    Consulted and Agree with Plan of Care Patient        Problem List Patient Active Problem List   Diagnosis Date Noted  . Osteopenia 04/24/2014  . Cellulitis of right lower extremity   . Hyponatremia   . Blood poisoning   . Cellulitis 03/02/2014  . Type 2 diabetes mellitus with diabetic polyneuropathy 01/13/2014  . Sinus tachycardia by electrocardiogram 01/01/2014  . Disease of pituitary gland 11/25/2013  . Cushing's disease 10/15/2013  . ACTH excess, central 10/15/2013  . Endometrial cancer 07/03/2012  . Elevated cholesterol with high triglycerides 03/02/2011  . Cellulitis, leg 02/26/2011  . Diabetes mellitus type 2, uncontrolled, without complications 35/36/1443  . HTN (hypertension)  02/26/2011  . POLYCYSTIC OVARY 03/08/2006  . Obesity 03/08/2006  . Asthma 03/08/2006  . Deficiency, disaccharidase intestinal 03/08/2006   Kerin Perna, PTA 05/22/2014 11:01 AM  [place], [place], [address] Phone: [phone]   Fax:  [phone]

## 2014-05-27 ENCOUNTER — Ambulatory Visit (INDEPENDENT_AMBULATORY_CARE_PROVIDER_SITE_OTHER): Payer: BLUE CROSS/BLUE SHIELD | Admitting: Physical Therapy

## 2014-05-27 DIAGNOSIS — R52 Pain, unspecified: Secondary | ICD-10-CM

## 2014-05-27 DIAGNOSIS — R531 Weakness: Secondary | ICD-10-CM | POA: Diagnosis not present

## 2014-05-27 DIAGNOSIS — R6889 Other general symptoms and signs: Secondary | ICD-10-CM

## 2014-05-27 NOTE — Therapy (Signed)
[place], [place], [address] Phone: [phone]   Fax:  [phone]  Physical Therapy Treatment  Patient Details  Name: Kelli Allen MRN: [id number] Date of Birth: 03/06/1975 Referring Provider:  Lovett Calender, PA-C  Encounter Date: 05/27/2014      PT End of Session - 05/27/14 0710    Visit Number 7   Number of Visits 12   Date for PT Re-Evaluation 06/10/14   PT Start Time 0708   PT Stop Time 0751   PT Time Calculation (min) 43 min   Activity Tolerance Patient limited by pain      Past Medical History  Diagnosis Date  . Cellulitis of lower leg     x3 on right leg  . Hypertension   . Irregular menstrual bleeding   . PCOS (polycystic ovarian syndrome)   . Tachycardia   . Abnormal Pap smear 2000  . Endometrial ca 12/20/2011  . Asthma     no issues in years  . Family history of anesthesia complication     sister had hard time waking up and problems breathing  . Cushing's syndrome 09/25/2013    MRI done on 10/02/2013  . 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 Diabetes mellitus   . Anxiety   . Neuromuscular disorder     DIABETIC NEUROPATHY    Past Surgical History  Procedure Laterality Date  . Hysteroscopy w/d&c  01/24/2012    Procedure: DILATATION AND CURETTAGE /HYSTEROSCOPY;  Surgeon: Betsy Coder, MD;  Location: [facility];  Service: Gynecology;  Laterality: N/A;  with vulvar biopsies   . Intrauterine device (iud) insertion  01/24/2012    Procedure: INTRAUTERINE DEVICE (IUD) INSERTION;  Surgeon: Betsy Coder, MD;  Location: [facility];  Service: Gynecology;  Laterality: N/A;  . Dilation and curettage of uterus    . Robotic assisted total hysterectomy with bilateral salpingo oopherectomy Bilateral 07/02/2012    Procedure: ROBOTIC ASSISTED TOTAL HYSTERECTOMY WITH BILATERAL SALPINGO OOPHORECTOMY ;  Surgeon: Imagene Gurney A.  Alycia Rossetti, MD;  Location: [facility];  Service: Gynecology;  Laterality: Bilateral;  . Abdominal hysterectomy    . Transsphenoid hypophysectomy  11/26/2013    ACTH secreting pituitary lesion causing Cushing's; [facility]; Angelene Giovanni, MD    There were no vitals filed for this visit.  Visit Diagnosis:  Pain, generalized  Weakness generalized  Activity intolerance      Subjective Assessment - 05/27/14 0711    Subjective Pt had massage last night, felt good but no relief in knee. Pt has been lowering her prednisone dose.    Currently in Pain? Yes   Pain Score 5    Pain Location Knee   Pain Orientation Right   Pain Descriptors / Indicators Aching;Burning   Aggravating Factors  moving, weight bearing    Pain Relieving Factors massage, sitting still             [facility] PT Assessment - 05/27/14 0001    Assessment   Medical Diagnosis lumbar stenosis   Onset Date 01/28/14   Strength   Right/Left Hip Right;Left   Right Hip Flexion --  5-/5, with pain in thigh   Right Hip Extension 5/5   Right Hip ABduction --  5-/5, with pain    Left Hip Flexion 5/5   Left Hip Extension --  5-/5   Left Hip ADduction 5/5   Right Knee Flexion 5/5   Right Knee Extension  5/5                     [facility] Adult PT Treatment/Exercise - 05/27/14 0001    Lumbar Exercises: Stretches   Passive Hamstring Stretch 2 reps;20 seconds  each leg    Lower Trunk Rotation 5 reps;10 seconds   ITB Stretch 2 reps;30 seconds  each leg   Lumbar Exercises: Aerobic   Stationary Bike Nustep L4 x 5'   Knee/Hip Exercises: Machines for Strengthening   Cybex Knee Extension 2 plates x 10 reps,    Cybex Leg Press 6 plates, 1 rep - stopped due to back pain.    Knee/Hip Exercises: Standing   Side Lunges 10 reps   Side Lunges Limitations stopped due to pain in Rt lateral knee    Step Down 1 set;Right;Left;10 reps  with backward step up, 3" no UE support    SLS 3 trials of up to 7 sec on Lt, up to 15 sec on  Rt.    Knee/Hip Exercises: Seated   Other Seated Knee Exercises seated on dyna-disc : pelvic tilts ant/post, side /side, CW/ CCW, marching with trans abd engaged.                   PT Short Term Goals - 05/13/14 0726    PT SHORT TERM GOAL #1   Title I with initial HEP   Status Achieved   PT SHORT TERM GOAL #2   Title increase strength of Rt hip flexors =/> 5-/5   Status Achieved   PT SHORT TERM GOAL #3   Title decrease pain =/> 25% in the Rt knee   Status On-going           PT Long Term Goals - 05/20/14 0714    PT LONG TERM GOAL #1   Title I with advanced HEP   Time 6   Period Weeks   Status On-going   PT LONG TERM GOAL #2   Title ambulate up/down stairs with reciprocal gait and no increase in knee pain   Time 6   Period Weeks   Status On-going   PT LONG TERM GOAL #3   Title improve FOTO =/< 34% limited   Time 6   Period Weeks   Status On-going   PT LONG TERM GOAL #4   Title stand and bake/cook without pain reprocussions the next day    Time 6   Period Weeks   Status Achieved               Plan - 05/27/14 0724    Clinical Impression Statement Pt demo improved Rt knee strength this visit, however had pain in Rt thigh with break test for hip flexion/abd.  Pt wasn't able to tolerate as many reps of exercises, due to increased pain/tenderness in Rt knee and LB. She attributes this to reducing her medicine dosage.    Pt will benefit from skilled therapeutic intervention in order to improve on the following deficits Impaired flexibility;Increased edema;Postural dysfunction;Pain;Decreased strength   Rehab Potential Good   PT Frequency 2x / week   PT Duration 6 weeks   PT Treatment/Interventions Moist Heat;Therapeutic exercise;Patient/family education;Ultrasound   PT Next Visit Plan Continue core and LE strengthening. Modalities PRN.    Consulted and Agree with Plan of Care Patient        Problem List Patient Active Problem List   Diagnosis Date  Noted  . Osteopenia 04/24/2014  . Cellulitis of right lower extremity   .  Hyponatremia   . Blood poisoning   . Cellulitis 03/02/2014  . Type 2 diabetes mellitus with diabetic polyneuropathy 01/13/2014  . Sinus tachycardia by electrocardiogram 01/01/2014  . Disease of pituitary gland 11/25/2013  . Cushing's disease 10/15/2013  . ACTH excess, central 10/15/2013  . Endometrial cancer 07/03/2012  . Elevated cholesterol with high triglycerides 03/02/2011  . Cellulitis, leg 02/26/2011  . Diabetes mellitus type 2, uncontrolled, without complications 93/73/4287  . HTN (hypertension) 02/26/2011  . POLYCYSTIC OVARY 03/08/2006  . Obesity 03/08/2006  . Asthma 03/08/2006  . Deficiency, disaccharidase intestinal 03/08/2006   Kerin Perna, PTA 05/27/2014 7:58 AM  [facility] [place], [place], [address] Phone: [phone]   Fax:  [phone]

## 2014-05-29 ENCOUNTER — Ambulatory Visit (INDEPENDENT_AMBULATORY_CARE_PROVIDER_SITE_OTHER): Payer: BLUE CROSS/BLUE SHIELD | Admitting: Physical Therapy

## 2014-05-29 DIAGNOSIS — R531 Weakness: Secondary | ICD-10-CM | POA: Diagnosis not present

## 2014-05-29 DIAGNOSIS — R52 Pain, unspecified: Secondary | ICD-10-CM

## 2014-05-29 DIAGNOSIS — R6889 Other general symptoms and signs: Secondary | ICD-10-CM

## 2014-05-29 NOTE — Therapy (Signed)
[place], [place], [address] Phone: [phone]   Fax:  [phone]  Physical Therapy Treatment  Patient Details  Name: Kelli Allen MRN: [id number] Date of Birth: January 19, 1975 Referring Provider:  Lovett Calender, PA-C  Encounter Date: 05/29/2014      PT End of Session - 05/29/14 1548    Visit Number 8   Number of Visits 12   Date for PT Re-Evaluation 06/10/14   PT Start Time 1536   PT Stop Time 1620   PT Time Calculation (min) 44 min   Activity Tolerance Patient tolerated treatment well      Past Medical History  Diagnosis Date  . Cellulitis of lower leg     x3 on right leg  . Hypertension   . Irregular menstrual bleeding   . PCOS (polycystic ovarian syndrome)   . Tachycardia   . Abnormal Pap smear 2000  . Endometrial ca 12/20/2011  . Asthma     no issues in years  . Family history of anesthesia complication     sister had hard time waking up and problems breathing  . Cushing's syndrome 09/25/2013    MRI done on 10/02/2013  . 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 Diabetes mellitus   . Anxiety   . Neuromuscular disorder     DIABETIC NEUROPATHY    Past Surgical History  Procedure Laterality Date  . Hysteroscopy w/d&c  01/24/2012    Procedure: DILATATION AND CURETTAGE /HYSTEROSCOPY;  Surgeon: Betsy Coder, MD;  Location: [facility];  Service: Gynecology;  Laterality: N/A;  with vulvar biopsies   . Intrauterine device (iud) insertion  01/24/2012    Procedure: INTRAUTERINE DEVICE (IUD) INSERTION;  Surgeon: Betsy Coder, MD;  Location: [facility];  Service: Gynecology;  Laterality: N/A;  . Dilation and curettage of uterus    . Robotic assisted total hysterectomy with bilateral salpingo oopherectomy Bilateral 07/02/2012    Procedure: ROBOTIC ASSISTED TOTAL HYSTERECTOMY WITH BILATERAL SALPINGO OOPHORECTOMY ;  Surgeon:  Imagene Gurney A. Alycia Rossetti, MD;  Location: [facility];  Service: Gynecology;  Laterality: Bilateral;  . Abdominal hysterectomy    . Transsphenoid hypophysectomy  11/26/2013    ACTH secreting pituitary lesion causing Cushing's; [facility]; Angelene Giovanni, MD    There were no vitals filed for this visit.  Visit Diagnosis:  Pain, generalized  Weakness generalized  Activity intolerance      Subjective Assessment - 05/29/14 1607    Subjective "Stairs are not as emotionally challenging as they were".  Wrists are "talking to me" more.    Currently in Pain? Yes   Pain Score 4    Pain Location Knee   Pain Orientation Right   Multiple Pain Sites Yes   Pain Score 5   Pain Location Wrist   Pain Orientation Right;Left   Aggravating Factors  ?   Pain Relieving Factors ?            [facility] PT Assessment - 05/29/14 0001    Assessment   Medical Diagnosis lumbar stenosis   Onset Date/Surgical Date 01/28/14          [facility] Adult PT Treatment/Exercise - 05/29/14 0001    Lumbar Exercises: Stretches   Passive Hamstring Stretch 2 reps;20 seconds   Lumbar Exercises: Aerobic   Stationary Bike NuStep L4 x 5 min (SPM 80-90   Knee/Hip Exercises: Clinical research associate 20 seconds;2 reps   Knee/Hip Exercises:  Machines for Strengthening   Cybex Knee Extension 2 plates x 10 reps,    Knee/Hip Exercises: Standing   Heel Raises 1 set;15 reps   Lateral Step Up 1 set;10 reps;Right;Left  3" step, UE support    Functional Squat 10 reps;2 sets  VC for form   Other Standing Knee Exercises side stepping with yellow band around ankle x 10 ft x 4 trials.    Other Standing Knee Exercises cross over step ups 3" step x 5 reps each side (difficult); wall push ups x 10 x 2 sets    Knee/Hip Exercises: Seated   Long Arc Quad 1 set;Right;Left  8 reps, held 5 sec in ext                  PT Short Term Goals - 05/13/14 0726    PT SHORT TERM GOAL #1   Title I with initial HEP   Status Achieved   PT SHORT  TERM GOAL #2   Title increase strength of Rt hip flexors =/> 5-/5   Status Achieved   PT SHORT TERM GOAL #3   Title decrease pain =/> 25% in the Rt knee   Status On-going           PT Long Term Goals - 05/20/14 0714    PT LONG TERM GOAL #1   Title I with advanced HEP   Time 6   Period Weeks   Status On-going   PT LONG TERM GOAL #2   Title ambulate up/down stairs with reciprocal gait and no increase in knee pain   Time 6   Period Weeks   Status On-going   PT LONG TERM GOAL #3   Title improve FOTO =/< 34% limited   Time 6   Period Weeks   Status On-going   PT LONG TERM GOAL #4   Title stand and bake/cook without pain reprocussions the next day    Time 6   Period Weeks   Status Achieved               Plan - 05/29/14 1616    Clinical Impression Statement Pt tolerating exercise with less "burning pain"; demonstrating increased activity tolerance each treatment. Progressing towards goals.    Pt will benefit from skilled therapeutic intervention in order to improve on the following deficits Impaired flexibility;Increased edema;Postural dysfunction;Pain;Decreased strength   Rehab Potential Good   PT Frequency 2x / week   PT Duration 6 weeks   PT Treatment/Interventions Moist Heat;Therapeutic exercise;Patient/family education;Ultrasound   PT Next Visit Plan Progress current HEP. Add wrist exercises.    Consulted and Agree with Plan of Care Patient        Problem List Patient Active Problem List   Diagnosis Date Noted  . Osteopenia 04/24/2014  . Cellulitis of right lower extremity   . Hyponatremia   . Blood poisoning   . Cellulitis 03/02/2014  . Type 2 diabetes mellitus with diabetic polyneuropathy 01/13/2014  . Sinus tachycardia by electrocardiogram 01/01/2014  . Disease of pituitary gland 11/25/2013  . Cushing's disease 10/15/2013  . ACTH excess, central 10/15/2013  . Endometrial cancer 07/03/2012  . Elevated cholesterol with high triglycerides  03/02/2011  . Cellulitis, leg 02/26/2011  . Diabetes mellitus type 2, uncontrolled, without complications 23/55/7322  . HTN (hypertension) 02/26/2011  . POLYCYSTIC OVARY 03/08/2006  . Obesity 03/08/2006  . Asthma 03/08/2006  . Deficiency, disaccharidase intestinal 03/08/2006   Kerin Perna, PTA 05/29/2014 4:24 PM  [facility]  [place], [place], [address] Phone: [phone]   Fax:  [phone]

## 2014-06-01 ENCOUNTER — Encounter: Payer: BLUE CROSS/BLUE SHIELD | Admitting: Physical Therapy

## 2014-06-03 ENCOUNTER — Ambulatory Visit (INDEPENDENT_AMBULATORY_CARE_PROVIDER_SITE_OTHER): Payer: BLUE CROSS/BLUE SHIELD | Admitting: Physical Therapy

## 2014-06-03 DIAGNOSIS — R531 Weakness: Secondary | ICD-10-CM | POA: Diagnosis not present

## 2014-06-03 DIAGNOSIS — R6889 Other general symptoms and signs: Secondary | ICD-10-CM | POA: Diagnosis not present

## 2014-06-03 DIAGNOSIS — R52 Pain, unspecified: Secondary | ICD-10-CM

## 2014-06-03 NOTE — Therapy (Signed)
[place], [place], [address] Phone: [phone]   Fax:  [phone]  Physical Therapy Treatment  Patient Details  Name: Kelli Allen MRN: [id number] Date of Birth: [birth year] Referring Provider:  Lovett Calender, PA-C  Encounter Date: 06/03/2014      PT End of Session - 06/03/14 0826    Visit Number 9   Number of Visits 12   Date for PT Re-Evaluation 06/10/14   PT Start Time 0815   PT Stop Time 0846   PT Time Calculation (min) 31 min   Activity Tolerance Patient tolerated treatment well      Past Medical History  Diagnosis Date  . Cellulitis of lower leg     x3 on right leg  . Hypertension   . Irregular menstrual bleeding   . PCOS (polycystic ovarian syndrome)   . Tachycardia   . Abnormal Pap smear 2000  . Endometrial ca 12/20/2011  . Asthma     no issues in years  . Family history of anesthesia complication     sister had hard time waking up and problems breathing  . Cushing's syndrome 09/25/2013    MRI done on 10/02/2013  . 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 Diabetes mellitus   . Anxiety   . Neuromuscular disorder     DIABETIC NEUROPATHY    Past Surgical History  Procedure Laterality Date  . Hysteroscopy w/d&c  01/24/2012    Procedure: DILATATION AND CURETTAGE /HYSTEROSCOPY;  Surgeon: Betsy Coder, MD;  Location: [facility];  Service: Gynecology;  Laterality: N/A;  with vulvar biopsies   . Intrauterine device (iud) insertion  01/24/2012    Procedure: INTRAUTERINE DEVICE (IUD) INSERTION;  Surgeon: Betsy Coder, MD;  Location: [facility];  Service: Gynecology;  Laterality: N/A;  . Dilation and curettage of uterus    . Robotic assisted total hysterectomy with bilateral salpingo oopherectomy Bilateral 07/02/2012    Procedure: ROBOTIC ASSISTED TOTAL HYSTERECTOMY WITH BILATERAL SALPINGO OOPHORECTOMY ;  Surgeon:  Imagene Gurney A. Alycia Rossetti, MD;  Location: [facility];  Service: Gynecology;  Laterality: Bilateral;  . Abdominal hysterectomy    . Transsphenoid hypophysectomy  11/26/2013    ACTH secreting pituitary lesion causing Cushing's; [facility]; Angelene Giovanni, MD    There were no vitals filed for this visit.  Visit Diagnosis:  Pain, generalized  Weakness generalized  Activity intolerance      Subjective Assessment - 06/03/14 0821    Currently in Pain? Yes   Pain Score 4    Pain Location Wrist   Pain Orientation Right;Left   Pain Descriptors / Indicators Tender  "bruised feeling"    Aggravating Factors  carrying items    Pain Relieving Factors rest             [facility] PT Assessment - 06/03/14 0001    Assessment   Medical Diagnosis lumbar stenosis   Onset Date/Surgical Date 01/28/14                     [facility] Adult PT Treatment/Exercise - 06/03/14 0001    Exercises   Exercises Knee/Hip;Wrist   Lumbar Exercises: Aerobic   Stationary Bike NuStep L4 x 5 min (SPM 80-90   Knee/Hip Exercises: Stretches   Gastroc Stretch 2 reps;20 seconds   Knee/Hip Exercises: Machines for Strengthening   Cybex Knee Extension 2 plates x 10, 3 plates x 5 reps   Knee/Hip Exercises: Standing  Heel Raises 2 sets;10 reps   Other Standing Knee Exercises side stepping with red band around ankle x 25 ft x 4 trials.    Wrist Exercises   Other wrist exercises wrist extension stretch x 30 sec x 3 reps each side    Modalities   Modalities Cryotherapy   Cryotherapy   Number Minutes Cryotherapy 5 Minutes   Cryotherapy Location Wrist   Type of Cryotherapy Ice massage                PT Education - 06/03/14 1412    Education provided Yes   Education Details HEP- reviewed wrist stretches/thumb stretches, self care: instructed on ice massage for pain relief.    Person(s) Educated Patient   Methods Explanation   Comprehension Verbalized understanding;Returned demonstration          PT Short  Term Goals - 05/13/14 0726    PT SHORT TERM GOAL #1   Title I with initial HEP   Status Achieved   PT SHORT TERM GOAL #2   Title increase strength of Rt hip flexors =/> 5-/5   Status Achieved   PT SHORT TERM GOAL #3   Title decrease pain =/> 25% in the Rt knee   Status On-going           PT Long Term Goals - 05/20/14 0714    PT LONG TERM GOAL #1   Title I with advanced HEP   Time 6   Period Weeks   Status On-going   PT LONG TERM GOAL #2   Title ambulate up/down stairs with reciprocal gait and no increase in knee pain   Time 6   Period Weeks   Status On-going   PT LONG TERM GOAL #3   Title improve FOTO =/< 34% limited   Time 6   Period Weeks   Status On-going   PT LONG TERM GOAL #4   Title stand and bake/cook without pain reprocussions the next day    Time 6   Period Weeks   Status Achieved               Plan - 06/03/14 1410    Clinical Impression Statement Pt tolerated increased resistance with band for ther ex. Pt reported decrease in wrist pain with trial of ice massage to Rt wrist at distal radius/thumb tendons.  Pt's treatment limited due to pt's late arrival.    Pt will benefit from skilled therapeutic intervention in order to improve on the following deficits Impaired flexibility;Increased edema;Postural dysfunction;Pain;Decreased strength   Rehab Potential Good   PT Frequency 2x / week   PT Duration 6 weeks   PT Treatment/Interventions Moist Heat;Therapeutic exercise;Patient/family education;Ultrasound   PT Next Visit Plan Progress current HEP. Add wrist exercises. Assess need for continued therapy vs d/c to HEP.    Consulted and Agree with Plan of Care Patient        Problem List Patient Active Problem List   Diagnosis Date Noted  . Osteopenia 04/24/2014  . Cellulitis of right lower extremity   . Hyponatremia   . Blood poisoning   . Cellulitis 03/02/2014  . Type 2 diabetes mellitus with diabetic polyneuropathy 01/13/2014  . Sinus  tachycardia by electrocardiogram 01/01/2014  . Disease of pituitary gland 11/25/2013  . Cushing's disease 10/15/2013  . ACTH excess, central 10/15/2013  . Endometrial cancer 07/03/2012  . Elevated cholesterol with high triglycerides 03/02/2011  . Cellulitis, leg 02/26/2011  . Diabetes mellitus type 2, uncontrolled, without complications 62/69/4854  .  HTN (hypertension) 02/26/2011  . POLYCYSTIC OVARY 03/08/2006  . Obesity 03/08/2006  . Asthma 03/08/2006  . 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

## 2014-06-05 ENCOUNTER — Other Ambulatory Visit: Payer: Self-pay | Admitting: Physician Assistant

## 2014-06-05 ENCOUNTER — Other Ambulatory Visit: Payer: Self-pay | Admitting: Family Medicine

## 2014-06-06 ENCOUNTER — Encounter: Payer: Self-pay | Admitting: Family Medicine

## 2014-06-07 ENCOUNTER — Other Ambulatory Visit: Payer: Self-pay | Admitting: Family Medicine

## 2014-06-07 MED ORDER — LISINOPRIL-HYDROCHLOROTHIAZIDE 10-12.5 MG PO TABS: 1.0000 | ORAL_TABLET | Freq: Every day | ORAL | Status: DC

## 2014-06-08 ENCOUNTER — Other Ambulatory Visit: Payer: Self-pay | Admitting: Physician Assistant

## 2014-06-09 ENCOUNTER — Encounter: Payer: BLUE CROSS/BLUE SHIELD | Admitting: Rehabilitative and Restorative Service Providers"

## 2014-06-09 ENCOUNTER — Encounter: Payer: Self-pay | Admitting: Gynecologic Oncology

## 2014-06-09 ENCOUNTER — Ambulatory Visit: Payer: BLUE CROSS/BLUE SHIELD | Attending: Gynecologic Oncology | Admitting: Gynecologic Oncology

## 2014-06-09 VITALS — BP 122/63 | HR 90 | Temp 98.3°F | Resp 20 | Ht 65.0 in | Wt 265.3 lb

## 2014-06-09 DIAGNOSIS — Z90722 Acquired absence of ovaries, bilateral: Secondary | ICD-10-CM | POA: Diagnosis not present

## 2014-06-09 DIAGNOSIS — Z794 Long term (current) use of insulin: Secondary | ICD-10-CM | POA: Insufficient documentation

## 2014-06-09 DIAGNOSIS — E1140 Type 2 diabetes mellitus with diabetic neuropathy, unspecified: Secondary | ICD-10-CM | POA: Diagnosis not present

## 2014-06-09 DIAGNOSIS — R21 Rash and other nonspecific skin eruption: Secondary | ICD-10-CM

## 2014-06-09 DIAGNOSIS — Z8542 Personal history of malignant neoplasm of other parts of uterus: Secondary | ICD-10-CM

## 2014-06-09 DIAGNOSIS — Z08 Encounter for follow-up examination after completed treatment for malignant neoplasm: Secondary | ICD-10-CM | POA: Diagnosis present

## 2014-06-09 DIAGNOSIS — Z90710 Acquired absence of both cervix and uterus: Secondary | ICD-10-CM | POA: Diagnosis not present

## 2014-06-09 DIAGNOSIS — F419 Anxiety disorder, unspecified: Secondary | ICD-10-CM | POA: Insufficient documentation

## 2014-06-09 DIAGNOSIS — C541 Malignant neoplasm of endometrium: Secondary | ICD-10-CM

## 2014-06-09 MED ORDER — NYSTATIN 100000 UNIT/GM EX POWD: Freq: Four times a day (QID) | CUTANEOUS | Status: DC

## 2014-06-09 NOTE — Patient Instructions (Addendum)
Return to clinic in 6 months as scheduled above.  Nystatin topical powder What is this medicine? NYSTATIN (nye STAT in) is an antifungal medicine. It is used to treat certain kinds of fungal or yeast infections of the skin. This medicine may be used for other purposes; ask your health care provider or pharmacist if you have questions. COMMON BRAND NAME(S): Mycostatin, Nyamyc, Nystop, Pedi-Dri What should I tell my health care provider before I take this medicine? They need to know if you have any of these conditions: -an unusual or allergic reaction to nystatin, other foods, dyes or preservatives -pregnant or trying to get pregnant -breast-feeding How should I use this medicine? This medicine is for external use on the skin only. Follow the directions on the prescription label. Dust the powder on the affected area (or into socks and shoes). If you are treating diaper rash, do not use tight-fitting diapers or plastic pants. Do not get the medicine in your eyes. If you do, rinse out with plenty of cool tap water. Do not breathe in the powder. Do not use your medicine more often than directed. Use your doses at regular intervals. Finish the full course prescribed by your doctor or health care professional even if you think your condition is better. Do not stop using except on the advice of your doctor or health care professional. Talk to your pediatrician regarding the use of this medicine in children. Special care may be needed. Overdosage: If you think you have taken too much of this medicine contact a poison control center or emergency room at once. NOTE: This medicine is only for you. Do not share this medicine with others. What if I miss a dose? If you miss a dose, use it as soon as you can. If it is almost time for your next dose, use only that dose. Do not use double or extra doses. What may interact with this medicine? Interactions are not expected. Do not use any other skin products on the  affected area without telling your doctor or health care professional. This list may not describe all possible interactions. Give your health care provider a list of all the medicines, herbs, non-prescription drugs, or dietary supplements you use. Also tell them if you smoke, drink alcohol, or use illegal drugs. Some items may interact with your medicine. What should I watch for while using this medicine? Tell your doctor or health care professional if your symptoms do not improve after 3 days. After bathing make sure that your skin is very dry. Fungal infections like moist conditions. Do not walk around barefoot. To help prevent reinfection, wear freshly washed cotton, not synthetic, clothing. What side effects may I notice from receiving this medicine? Side effects that usually do not require medical attention (report to your doctor or health care professional if they continue or are bothersome): -skin irritation This list may not describe all possible side effects. Call your doctor for medical advice about side effects. You may report side effects to FDA at 1-800-FDA-1088. Where should I keep my medicine? Keep out of the reach of children. Store at room temperature between 15 and 30 degrees C (59 and 86 degrees F). Throw away any unused medicine after the expiration date. NOTE: This sheet is a summary. It may not cover all possible information. If you have questions about this medicine, talk to your doctor, pharmacist, or health care provider.  2015, Elsevier/Gold Standard. (2007-07-22 14:28:12)

## 2014-06-09 NOTE — Progress Notes (Signed)
Consult Note: Gyn-Onc  Kelli Allen 39 y.o. female with history of a stage IA grade 1 endometrioid adenocarcinoma who has no evidence of recurrent disease. We'll follow-up in results for Pap smear from today. Return to see me in 6 months or when necessary.  CC:  Chief Complaint  Patient presents with  . endometrial cancer    follow-up    HPI:  Patient is a 39 year old gravida 0 para 0 who remembers having irregular periods in which he passed some tissue in October of 2012. She was seen by Dr. Charlesetta Garibaldi and apparently Dr. Charlesetta Garibaldi wanted to perform a history of sonogram but the patient declined. She was seen in followup with Dr. Charlesetta Garibaldi in September was started on birth control pills. In October or November she had some spotting and it was fairly regular for a few days. She ultimately underwent a hysteroscopy D&C that Dr. Charlesetta Garibaldi and 1 he to perform and underwent an endometrial biopsy on November 7. [name] 31-year-old a polyp. The uterus measures 8.04 x 4.3 cm with normal appearing adnexa. There was a 1.2 cm polyp noted. She had an endometrial biopsy that revealed a grade 1 endometrial carcinoma arising atypical complex hyperplasia.   She underwent a D&C IUD placement with Dr. Charlesetta Garibaldi on January 24, 2012. Pathology revealed an endometrioid adenocarcinoma grade 1 arising in a background of atypical complex hyperplasia. She also had some benign melanotic nevi removed at that time.  She got a second opinion with Dr. Gaetano Net who told her that the most definitive treatment would be hysterectomy.    Review of Systems: I performed an endometrial biopsy on her in April 2014 that revealed persistent adenocarcinoma. She decided she wanted to proceed with hysterectomy.   On 07/02/2012 showing a robotic hysterectomy bilateral salpingo-oophorectomy. Pathology revealed: Diagnosis Uterus +/- tubes/ovaries, neoplastic, with cervix - ENDOMETRIOID CARCINOMA, FIGO GRADE I. - BACKGROUND ATYPICAL  COMPLEX HYPERPLASIA AND A ENDOMETRIAL POLYP. PLEASE SEE COMMENT. - BILATERAL OVARIES AND FALLOPIAN TUBES: NO EVIDENCE OF ATYPIA OR MALIGNANCY. - CERVIX: BENIGN SQUAMOUS MUCOSA AND ENDOCERVICAL MUCOSA, NO DYSPLASIA OR MALIGNANCY. - LEIOMYOMA.  I last saw her 12/15 at which time her exam and pap smear were normal. She comes in today for follow up. She has lost about 17# since we last saw her. She had a mammogram in December that was unremarkable. She overall feeling much better. Her energy slowly increasing since her pituitary surgery in November. Her cortisol levels are still low but still improving and they're decreasing her steroid-induced dose. With the decrease in her straightforward dosing, she has noticed some increase in her joint pains. She is undergoing physical therapy primarily for her right wrist. Fortunately she is left wrist dominant. Her last physical therapy session is this coming Friday. She is starting to do some walking on a treadmill and her peripheral just opened and she is looking forward to starting this when. Her A1c has improved from 9.3-7. There are no new medical problems and her family.  Review of Systems  Constitutional: Denies fever. + right wrist and right knee pain Skin: + rash under pannus Cardiovascular: No chest pain, shortness of breath, + LE edema R>L Pulmonary: No cough Gastro Intestinal:  No nausea, vomiting, constipation Genitourinary: No frequency, urgency, or dysuria.  Denies vaginal bleeding Psychology: Feeling more hopeful   Current Meds:  Outpatient Encounter Prescriptions as of 06/09/2014  Medication Sig  . albuterol (PROAIR HFA) 108 (90 BASE) MCG/ACT inhaler Inhale 2 puffs into the lungs every 6 (six) hours  as needed for shortness of breath.  Marland Kitchen BAYER CONTOUR TEST test strip   . cyclobenzaprine (FLEXERIL) 10 MG tablet Take 1 tablet (10 mg total) by mouth 2 (two) times daily as needed for muscle spasms.  . Ergocalciferol (VITAMIN D2) 2000 UNITS TABS  Take 1 tablet by mouth daily.   Marland Kitchen FLUoxetine (PROZAC) 20 MG tablet Take 2 tablets (40 mg total) by mouth daily.  Marland Kitchen gabapentin (NEURONTIN) 300 MG capsule Take 1-2 capsules (300-600 mg total) by mouth 3 (three) times daily.  . insulin aspart (NOVOLOG FLEXPEN) 100 UNIT/ML FlexPen Inject 22 units with a correction 1:50 > 150 three times daily before meals  . Insulin Glargine (LANTUS SOLOSTAR) 100 UNIT/ML Solostar Pen Inject 80 Units into the skin at bedtime.   Marland Kitchen labetalol (NORMODYNE) 200 MG tablet TAKE ONE TABLET BY MOUTH TWICE DAILY. "OV NEEDED FOR ADDITIONAL REFILLS"  . Liraglutide (VICTOZA) 18 MG/3ML SOPN Inject 1.8 mg into the skin daily.   Marland Kitchen lisinopril-hydrochlorothiazide (PRINZIDE,ZESTORETIC) 10-12.5 MG per tablet Take 1 tablet by mouth daily.  Marland Kitchen LORazepam (ATIVAN) 0.5 MG tablet Take 1 tablet (0.5 mg total) by mouth 2 (two) times daily as needed for anxiety. (Patient taking differently: Take 0.5 mg by mouth daily. )  . metformin (FORTAMET) 500 MG (OSM) 24 hr tablet Take 1,000 mg by mouth 2 (two) times daily with a meal.   . Multiple Vitamin (MULTIVITAMIN) capsule Take 1 capsule by mouth 2 (two) times daily.   . Omega-3 1000 MG CAPS Take 3 g by mouth daily.   . ondansetron (ZOFRAN) 8 MG tablet Take 8 mg by mouth every 8 (eight) hours as needed for nausea or vomiting.   Marland Kitchen OVER THE COUNTER MEDICATION Take 1 packet by mouth 2 (two) times daily. Nutralite multi  Vitamins  . OVER THE COUNTER MEDICATION Take 1 tablet by mouth 2 (two) times daily. Glucose Health Cromium Piccolate  . oxyCODONE-acetaminophen (PERCOCET/ROXICET) 5-325 MG per tablet Take 2 tablets by mouth every 4 (four) hours as needed for severe pain.  . predniSONE (DELTASONE) 1 MG tablet Take 7 mg by mouth daily.  . Probiotic Product (PROBIOTIC DAILY PO) Take 1 tablet by mouth daily.   . promethazine (PHENERGAN) 25 MG tablet Take 12.5 mg by mouth every 6 (six) hours as needed for nausea or vomiting.   . ranitidine (ZANTAC) 150 MG tablet  Take 150 mg by mouth 2 (two) times daily.   Marland Kitchen rOPINIRole (REQUIP) 0.25 MG tablet TAKE ONE TABLET BY MOUTH NIGHTLY AT BEDTIME   . traMADol (ULTRAM) 50 MG tablet Take 1 tablet (50 mg total) by mouth every 6 (six) hours as needed. (Patient taking differently: Take 50 mg by mouth every 6 (six) hours as needed for moderate pain or severe pain. )  . albuterol (PROVENTIL) (2.5 MG/3ML) 0.083% nebulizer solution Take 2.5 mg by nebulization every 6 (six) hours as needed for wheezing or shortness of breath.  Marland Kitchen atorvastatin (LIPITOR) 10 MG tablet Take 1 tablet (10 mg total) by mouth daily at 6 PM. (Patient not taking: Reported on 06/09/2014)  . diclofenac sodium (VOLTAREN) 1 % GEL Apply 4 g topically 4 (four) times daily. (Patient not taking: Reported on 06/09/2014)  . hydrocortisone (ANUSOL-HC) 25 MG suppository Place 1 suppository (25 mg total) rectally 2 (two) times daily. (Patient not taking: Reported on 06/09/2014)  . hydrocortisone 2.5 % ointment Apply topically 2 (two) times daily. (Patient not taking: Reported on 06/09/2014)  . silver sulfADIAZINE (SILVADENE) 1 % cream Apply 1 application topically  daily. Apply dime size thickness to affected area daily (Patient not taking: Reported on 06/09/2014)  . [DISCONTINUED] clindamycin (CLEOCIN) 300 MG capsule Take 1 capsule (300 mg total) by mouth 4 (four) times daily. X 7 days  . [DISCONTINUED] FLUoxetine (PROZAC) 20 MG capsule Take 40 mg by mouth daily.  . [DISCONTINUED] gabapentin (NEURONTIN) 300 MG capsule Take 900 mg by mouth at bedtime.  . [DISCONTINUED] metFORMIN (GLUCOPHAGE) 500 MG tablet    No facility-administered encounter medications on file as of 06/09/2014.    Allergy:  Allergies  Allergen Reactions  . Antibacterial Hand Soap [Triclosan] Other (See Comments) and Rash    Dry, itchy patches on skin    Social Hx:  She denies tobacco. She drinks alcohol socially she has a drug use. Her husband is with an Insurance claims handler but she is married. She  works in Therapist, art for [employer].  History   Social History  . Marital Status: Married    Spouse Name: N/A  . Number of Children: N/A  . Years of Education: N/A   Occupational History  . Not on file.   Social History Main Topics  . Smoking status: Never Smoker   . Smokeless tobacco: Never Used  . Alcohol Use: 0.0 oz/week    0 Standard drinks or equivalent per week     Comment: rare  . Drug Use: No  . Sexual Activity: Yes    Birth Control/ Protection: IUD     Comment: mirena    Other Topics Concern  . Not on file   Social History Narrative    Past Surgical Hx:  Past Surgical History  Procedure Laterality Date  . Hysteroscopy w/d&c  01/24/2012    Procedure: DILATATION AND CURETTAGE /HYSTEROSCOPY;  Surgeon: Betsy Coder, MD;  Location: [facility];  Service: Gynecology;  Laterality: N/A;  with vulvar biopsies   . Intrauterine device (iud) insertion  01/24/2012    Procedure: INTRAUTERINE DEVICE (IUD) INSERTION;  Surgeon: Betsy Coder, MD;  Location: [facility];  Service: Gynecology;  Laterality: N/A;  . Dilation and curettage of uterus    . Robotic assisted total hysterectomy with bilateral salpingo oopherectomy Bilateral 07/02/2012    Procedure: ROBOTIC ASSISTED TOTAL HYSTERECTOMY WITH BILATERAL SALPINGO OOPHORECTOMY ;  Surgeon: Imagene Gurney A. Alycia Rossetti, MD;  Location: [facility];  Service: Gynecology;  Laterality: Bilateral;  . Abdominal hysterectomy    . Transsphenoid hypophysectomy  11/26/2013    ACTH secreting pituitary lesion causing Cushing's; [facility]; Angelene Giovanni, MD    Past Medical Hx:  Past Medical History  Diagnosis Date  . Cellulitis of lower leg     x3 on right leg  . Hypertension   . Irregular menstrual bleeding   . PCOS (polycystic ovarian syndrome)   . Tachycardia   . Abnormal Pap smear 2000  . Endometrial ca 12/20/2011  . Asthma     no issues in years  . Family history of anesthesia complication     sister had hard time waking up and problems  breathing  . Cushing's syndrome 09/25/2013    MRI done on 10/02/2013  . 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 Diabetes mellitus   . Anxiety   . Neuromuscular disorder     DIABETIC NEUROPATHY    Family Hx: She is one of 7 girls. Although 2 of her sisters have depression. She has one sister with mitochondrial myopathy. Her paternal grandfather died of lung cancer she was a  smoker. Maternal grandmother may have had bone "cancer" Family History  Problem Relation Age of Onset  . Diabetes type II Mother   . Coronary artery disease Mother   . Diabetes Mother   . Heart disease Mother   . Hyperlipidemia Mother   . Hypertension Mother   . Diabetes type II Father   . Coronary artery disease Father   . Diabetes Father   . Heart disease Father   . Hyperlipidemia Father   . Heart disease Sister   . Diabetes Sister     Vitals:  Blood pressure 122/63, pulse 90, temperature 98.3 F (36.8 C), temperature source Oral, resp. rate 20, height 5\' 5"  (1.651 m), weight 265 lb 4.8 oz (120.339 kg), last menstrual period 01/17/2012.  Physical Exam:  Well-nourished well-developed female in no acute distress.   Neck: Supple, no lymphadenopathy, no thyromegaly.  Lungs: Clear to auscultation bilaterally.  Cardiovascular: Regular rate and rhythm.  Abdomen: Morbidly obese. Well-healed surgical incisions. No appreciable hernias. + rash c/w candida under pannus, skin intact.  Groins: No lymphadenopathy. Exam limited by habitus..  Extremities: Chronic venous stasis changes in the right lower extremity. 2+ edema right lower extremity 1+ edema left lower extremity.  Pelvic: Normal female genitalia. Vagina is well epithelialized. There is no discharge. The vaginal cuff is difficult to visualize secondary to habitus. There are no gross visible lesions. Bimanual examination reveals no masses or nodularity. However, exam is limited by habitus.     Assessment/Plan: 39 year old gravida 0 with a grade Ia grade 1 endometrial carcinoma s/p definitive therapy 6/14. She has had no evidence of recurrent disease in 2 years.  She will return to see me in 6 months or when necessary and we will perform a pap smear at that time. We sent in a prescription for nystatin powder. Instructions for use were discussed with the patient. We also discussed getting adequate closest and she can and keeping the pannus dry.   Shantasia Hunnell A., MD 06/09/2014, 9:47 AM

## 2014-06-10 ENCOUNTER — Encounter: Payer: BLUE CROSS/BLUE SHIELD | Admitting: Physical Therapy

## 2014-06-11 ENCOUNTER — Ambulatory Visit: Payer: BC Managed Care – PPO | Admitting: Gynecologic Oncology

## 2014-06-12 ENCOUNTER — Ambulatory Visit (INDEPENDENT_AMBULATORY_CARE_PROVIDER_SITE_OTHER): Payer: BLUE CROSS/BLUE SHIELD | Admitting: Physical Therapy

## 2014-06-12 DIAGNOSIS — R6889 Other general symptoms and signs: Secondary | ICD-10-CM | POA: Diagnosis not present

## 2014-06-12 DIAGNOSIS — R52 Pain, unspecified: Secondary | ICD-10-CM | POA: Diagnosis not present

## 2014-06-12 DIAGNOSIS — R531 Weakness: Secondary | ICD-10-CM | POA: Diagnosis not present

## 2014-06-12 NOTE — Therapy (Addendum)
[place], [place], [address] Phone: [phone]   Fax:  [phone]  Physical Therapy Treatment  Patient Details  Name: Chrysta Fulcher MRN: [id number] Date of Birth: 07-12-1975 Referring Provider:  Lovett Calender, PA-C  Encounter Date: 06/12/2014      PT End of Session - 06/12/14 0808    Visit Number 10   Number of Visits 12   Date for PT Re-Evaluation 06/10/14   PT Start Time 0808   PT Stop Time 0849   PT Time Calculation (min) 41 min   Activity Tolerance Patient limited by pain      Past Medical History  Diagnosis Date  . Cellulitis of lower leg     x3 on right leg  . Hypertension   . Irregular menstrual bleeding   . PCOS (polycystic ovarian syndrome)   . Tachycardia   . Abnormal Pap smear 2000  . Endometrial ca 12/20/2011  . Asthma     no issues in years  . Family history of anesthesia complication     sister had hard time waking up and problems breathing  . Cushing's syndrome 09/25/2013    MRI done on 10/02/2013  . 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 Diabetes mellitus   . Anxiety   . Neuromuscular disorder     DIABETIC NEUROPATHY    Past Surgical History  Procedure Laterality Date  . Hysteroscopy w/d&c  01/24/2012    Procedure: DILATATION AND CURETTAGE /HYSTEROSCOPY;  Surgeon: Betsy Coder, MD;  Location: [facility];  Service: Gynecology;  Laterality: N/A;  with vulvar biopsies   . Intrauterine device (iud) insertion  01/24/2012    Procedure: INTRAUTERINE DEVICE (IUD) INSERTION;  Surgeon: Betsy Coder, MD;  Location: [facility];  Service: Gynecology;  Laterality: N/A;  . Dilation and curettage of uterus    . Robotic assisted total hysterectomy with bilateral salpingo oopherectomy Bilateral 07/02/2012    Procedure: ROBOTIC ASSISTED TOTAL HYSTERECTOMY WITH BILATERAL SALPINGO OOPHORECTOMY ;  Surgeon: Imagene Gurney A.  Alycia Rossetti, MD;  Location: [facility];  Service: Gynecology;  Laterality: Bilateral;  . Abdominal hysterectomy    . Transsphenoid hypophysectomy  11/26/2013    ACTH secreting pituitary lesion causing Cushing's; [facility]; Angelene Giovanni, MD    There were no vitals filed for this visit.  Visit Diagnosis:  Pain, generalized  Weakness generalized  Activity intolerance      Subjective Assessment - 06/12/14 0810    Subjective Pt reports she has adjusted emotionally and physically, but she is noticing increased pain in all joints again.  Last wk was "good week", this wk more challenging.    Currently in Pain? Yes   Pain Score 5    Pain Location Wrist   Pain Orientation Right;Left   Pain Descriptors / Indicators Tender   Aggravating Factors  carrying items    Pain Relieving Factors rest   Pain Score 6   Pain Location --  "everywhere"    Pain Descriptors / Indicators Aching   Aggravating Factors  ?   Pain Relieving Factors ?            [facility] PT Assessment - 06/12/14 0001    Assessment   Medical Diagnosis lumbar stenosis   Onset Date/Surgical Date 01/28/14                     [facility] Adult PT Treatment/Exercise - 06/12/14 0001  Exercises   Exercises Elbow;Shoulder;Lumbar;Knee/Hip   Elbow Exercises   Elbow Flexion Right;Left;10 reps   Theraband Level (Elbow Flexion) Level 1 (Yellow)   Lumbar Exercises: Supine   Bridge 10 reps  caused Rt groin pain    Other Supine Lumbar Exercises Unilateral UE/LE stretch 5 reps each side x 15 sec    Other Supine Lumbar Exercises snow angels    Knee/Hip Exercises: Seated   Long Arc Quad Right;Left;1 set;10 reps   Other Seated Knee Exercises hamstring curls with yellow band x 15 reps each leg; LAQ with yellow band x 8 RLE (stopped due to pain in Rt shin.    Other Seated Knee Exercises seated marching, hip adduction (pillow squeezes) 3 sec hold x 15 reps    Shoulder Exercises: Seated   Horizontal ABduction Strengthening;Both;10  reps;Theraband   Theraband Level (Shoulder Horizontal ABduction) Level 1 (Yellow)   External Rotation Strengthening;Both;10 reps;Theraband   Theraband Level (Shoulder External Rotation) Level 1 (Yellow)                  PT Short Term Goals - 05/13/14 0726    PT SHORT TERM GOAL #1   Title I with initial HEP   Status Achieved   PT SHORT TERM GOAL #2   Title increase strength of Rt hip flexors =/> 5-/5   Status Achieved   PT SHORT TERM GOAL #3   Title decrease pain =/> 25% in the Rt knee   Status On-going           PT Long Term Goals - 06/12/14 1102    PT LONG TERM GOAL #1   Title I with advanced HEP   Time 6   Period Weeks   Status Achieved   PT LONG TERM GOAL #2   Title ambulate up/down stairs with reciprocal gait and no increase in knee pain   Time 6   Period Weeks   Status Not Met  pt states she can now ascend/descend stairs, but the pain remains unchanged.    PT LONG TERM GOAL #3   Title improve FOTO =/< 34% limited   Time 6   Period Weeks   Status Not Met  57% limitation at discharge   PT LONG TERM GOAL #4   Title stand and bake/cook without pain reprocussions the next day    Time 6   Period Weeks   Status Achieved               Plan - 06/12/14 1104    Clinical Impression Statement Pt has partially met her goals. She reports her pain waxes and wains dependent upon medication dosage, weather, and emotions.  Pt is agreeable and interested in d/c to HEP at this time and will return to MD for advisement.   Pt will benefit from skilled therapeutic intervention in order to improve on the following deficits Impaired flexibility;Increased edema;Postural dysfunction;Pain;Decreased strength   Rehab Potential Good   PT Treatment/Interventions Moist Heat;Therapeutic exercise;Patient/family education;Ultrasound   PT Next Visit Plan Spoke to supervising PT.  Will advise pt to return to MD for advisement and d/c to HEP.    Consulted and Agree with Plan of  Care Patient        Problem List Patient Active Problem List   Diagnosis Date Noted  . Osteopenia 04/24/2014  . Cellulitis of right lower extremity   . Hyponatremia   . Blood poisoning   . Cellulitis 03/02/2014  . Type 2 diabetes mellitus with diabetic polyneuropathy 01/13/2014  .  Sinus tachycardia by electrocardiogram 01/01/2014  . Disease of pituitary gland 11/25/2013  . Cushing's disease 10/15/2013  . ACTH excess, central 10/15/2013  . Endometrial cancer 07/03/2012  . Elevated cholesterol with high triglycerides 03/02/2011  . Cellulitis, leg 02/26/2011  . Diabetes mellitus type 2, uncontrolled, without complications 63/14/9702  . HTN (hypertension) 02/26/2011  . POLYCYSTIC OVARY 03/08/2006  . Obesity 03/08/2006  . Asthma 03/08/2006  . 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 Goals have been partially accomplished and Vineta will continue with HEP.   Remaining deficits: Continued pain; ascending and descending stairs remains painful.   Education / Equipment: HEP  Plan: Patient agrees to discharge.  Patient goals were partially met. Patient is being discharged due to being pleased with the current functional level.  ?????     Celyn P. Helene Kelp, PT, MPH 06/12/14 2:45 pm

## 2014-06-16 ENCOUNTER — Encounter: Payer: Self-pay | Admitting: Family Medicine

## 2014-06-28 ENCOUNTER — Other Ambulatory Visit: Payer: Self-pay | Admitting: Physician Assistant

## 2014-07-07 ENCOUNTER — Ambulatory Visit (INDEPENDENT_AMBULATORY_CARE_PROVIDER_SITE_OTHER): Payer: BLUE CROSS/BLUE SHIELD | Admitting: Family Medicine

## 2014-07-07 ENCOUNTER — Other Ambulatory Visit: Payer: Self-pay | Admitting: Family Medicine

## 2014-07-07 VITALS — BP 120/72 | HR 91 | Temp 98.2°F | Resp 20 | Ht 65.0 in | Wt 264.2 lb

## 2014-07-07 DIAGNOSIS — E1142 Type 2 diabetes mellitus with diabetic polyneuropathy: Secondary | ICD-10-CM | POA: Diagnosis not present

## 2014-07-07 DIAGNOSIS — L03115 Cellulitis of right lower limb: Secondary | ICD-10-CM

## 2014-07-07 DIAGNOSIS — E2740 Unspecified adrenocortical insufficiency: Secondary | ICD-10-CM | POA: Diagnosis not present

## 2014-07-07 LAB — COMPLETE METABOLIC PANEL WITH GFR
CO2: 26 mEq/L (ref 19–32)
Creat: 0.67 mg/dL (ref 0.50–1.10)
GFR, Est African American: >89 mL/min
GFR, Est Non African American: >89 mL/min
Glucose, Bld: 112 mg/dL — ABNORMAL HIGH (ref 70–99)
Sodium: 138 mEq/L (ref 135–145)
Total Bilirubin: 0.3 mg/dL (ref 0.2–1.2)
Total Protein: 6.9 g/dL (ref 6.0–8.3)

## 2014-07-07 LAB — POCT CBC
Granulocyte percent: 61.8 %G (ref 37–80)
HCT, POC: 35.7 % — AB (ref 37.7–47.9)
Hemoglobin: 12 g/dL — AB (ref 12.2–16.2)
Lymph, poc: 2.8 (ref 0.6–3.4)
MCH, POC: 30.8 pg (ref 27–31.2)
MCHC: 33.7 g/dL (ref 31.8–35.4)
MCV: 91.2 fL (ref 80–97)
MID (cbc): 0.6 (ref 0–0.9)
MPV: 7.4 fL (ref 0–99.8)
POC Granulocyte: 5.5 (ref 2–6.9)
POC LYMPH PERCENT: 31 % (ref 10–50)
POC MID %: 7.2 % (ref 0–12)
Platelet Count, POC: 391 10*3/uL (ref 142–424)
RBC: 3.91 M/uL — AB (ref 4.04–5.48)
RDW, POC: 13.4 %
WBC: 8.9 10*3/uL (ref 4.6–10.2)

## 2014-07-07 LAB — COMPLETE METABOLIC PANEL WITHOUT GFR
ALT: 21 U/L (ref 0–35)
AST: 18 U/L (ref 0–37)
Albumin: 4.5 g/dL (ref 3.5–5.2)
Alkaline Phosphatase: 78 U/L (ref 39–117)
BUN: 10 mg/dL (ref 6–23)
Calcium: 10.1 mg/dL (ref 8.4–10.5)
Chloride: 100 meq/L (ref 96–112)
Potassium: 4.5 meq/L (ref 3.5–5.3)

## 2014-07-07 MED ORDER — CEFTRIAXONE SODIUM 1 G IJ SOLR
1.0000 g | Freq: Once | INTRAMUSCULAR | Status: AC
  Administered 2014-07-07: 1 g via INTRAMUSCULAR

## 2014-07-07 MED ORDER — SULFAMETHOXAZOLE-TRIMETHOPRIM 800-160 MG PO TABS: 1.0000 | ORAL_TABLET | Freq: Two times a day (BID) | ORAL | Status: DC

## 2014-07-07 NOTE — Progress Notes (Signed)
Chief Complaint:  Chief Complaint  Patient presents with  . Insect Bite    bug bites to both arms and right leg.  has a hx of cellulitis in right lower leg.      HPI: Kelli Allen is a 39 y.o. female who is here for  right buttock bite and also right lower leg skin infection for the last 3 days. She is going to be traveling soon. She is going to be on the road to [place], also [place]. She has a history of recurrent cellulitis. She has not been feeling well. The last time she was hospitalized for an infection she was there for short spell. She has had some warmth and is and also increased redness in the right lower leg.No new n/w/t.   Past Medical History  Diagnosis Date  . Cellulitis of lower leg     x3 on right leg  . Hypertension   . Irregular menstrual bleeding   . PCOS (polycystic ovarian syndrome)   . Tachycardia   . Abnormal Pap smear 2000  . Endometrial ca 12/20/2011  . Asthma     no issues in years  . Family history of anesthesia complication     sister had hard time waking up and problems breathing  . Cushing's syndrome 09/25/2013    MRI done on 10/02/2013  . 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 Diabetes mellitus   . Anxiety   . Neuromuscular disorder     DIABETIC NEUROPATHY   Past Surgical History  Procedure Laterality Date  . Hysteroscopy w/d&c  01/24/2012    Procedure: DILATATION AND CURETTAGE /HYSTEROSCOPY;  Surgeon: Betsy Coder, MD;  Location: [facility];  Service: Gynecology;  Laterality: N/A;  with vulvar biopsies   . Intrauterine device (iud) insertion  01/24/2012    Procedure: INTRAUTERINE DEVICE (IUD) INSERTION;  Surgeon: Betsy Coder, MD;  Location: [facility];  Service: Gynecology;  Laterality: N/A;  . Dilation and curettage of uterus    . Robotic assisted total hysterectomy with bilateral salpingo oopherectomy Bilateral 07/02/2012    Procedure: ROBOTIC ASSISTED TOTAL  HYSTERECTOMY WITH BILATERAL SALPINGO OOPHORECTOMY ;  Surgeon: Imagene Gurney A. Alycia Rossetti, MD;  Location: [facility];  Service: Gynecology;  Laterality: Bilateral;  . Abdominal hysterectomy    . Transsphenoid hypophysectomy  11/26/2013    ACTH secreting pituitary lesion causing Cushing's; [facility]; Angelene Giovanni, MD   History   Social History  . Marital Status: Married    Spouse Name: N/A  . Number of Children: N/A  . Years of Education: N/A   Social History Main Topics  . Smoking status: Never Smoker   . Smokeless tobacco: Never Used  . Alcohol Use: 0.0 oz/week    0 Standard drinks or equivalent per week     Comment: rare  . Drug Use: No  . Sexual Activity: Yes    Birth Control/ Protection: IUD     Comment: mirena    Other Topics Concern  . None   Social History Narrative   Family History  Problem Relation Age of Onset  . Diabetes type II Mother   . Coronary artery disease Mother   . Diabetes Mother   . Heart disease Mother   . Hyperlipidemia Mother   . Hypertension Mother   . Diabetes type II Father   . Coronary artery disease Father   . Diabetes Father   . Heart disease Father   .  Hyperlipidemia Father   . Heart disease Sister   . Diabetes Sister    Allergies  Allergen Reactions  . Antibacterial Hand Soap [Triclosan] Other (See Comments) and Rash    Dry, itchy patches on skin   Prior to Admission medications   Medication Sig Start Date End Date Taking? Authorizing Provider  albuterol (PROAIR HFA) 108 (90 BASE) MCG/ACT inhaler Inhale 2 puffs into the lungs every 6 (six) hours as needed for shortness of breath. 05/14/14  Yes Wardell Honour, MD  albuterol (PROVENTIL) (2.5 MG/3ML) 0.083% nebulizer solution Take 2.5 mg by nebulization every 6 (six) hours as needed for wheezing or shortness of breath.   Yes Historical Provider, MD  BAYER CONTOUR TEST test strip  12/12/13  Yes Historical Provider, MD  cyclobenzaprine (FLEXERIL) 10 MG tablet Take 1 tablet (10 mg total) by mouth 2  (two) times daily as needed for muscle spasms. 03/27/14  Yes Kaitlyn Szekalski, PA-C  Ergocalciferol (VITAMIN D2) 2000 UNITS TABS Take 1 tablet by mouth daily.    Yes Historical Provider, MD  FLUoxetine (PROZAC) 20 MG tablet Take 2 tablets (40 mg total) by mouth daily. 05/14/14  Yes Wardell Honour, MD  gabapentin (NEURONTIN) 300 MG capsule Take 1-2 capsules (300-600 mg total) by mouth 3 (three) times daily. 05/14/14  Yes Wardell Honour, MD  insulin aspart (NOVOLOG FLEXPEN) 100 UNIT/ML FlexPen Inject 22 units with a correction 1:50 > 150 three times daily before meals 10/10/13  Yes Historical Provider, MD  Insulin Glargine (LANTUS SOLOSTAR) 100 UNIT/ML Solostar Pen Inject 80 Units into the skin at bedtime.  09/25/13  Yes Historical Provider, MD  labetalol (NORMODYNE) 200 MG tablet Take 1 tablet (200 mg total) by mouth 2 (two) times daily. 06/29/14  Yes Wardell Honour, MD  Liraglutide (VICTOZA) 18 MG/3ML SOPN Inject 1.8 mg into the skin daily.  10/17/13  Yes Historical Provider, MD  lisinopril-hydrochlorothiazide (PRINZIDE,ZESTORETIC) 10-12.5 MG per tablet Take 1 tablet by mouth daily. 06/07/14  Yes Wardell Honour, MD  LORazepam (ATIVAN) 0.5 MG tablet Take 1 tablet (0.5 mg total) by mouth 2 (two) times daily as needed for anxiety. Patient taking differently: Take 0.5 mg by mouth daily.  01/08/14  Yes Wardell Honour, MD  metformin (FORTAMET) 500 MG (OSM) 24 hr tablet Take 1,000 mg by mouth 2 (two) times daily with a meal.    Yes Historical Provider, MD  Multiple Vitamin (MULTIVITAMIN) capsule Take 1 capsule by mouth 2 (two) times daily.    Yes Historical Provider, MD  nystatin (MYCOSTATIN) powder Apply topically 4 (four) times daily. 06/09/14  Yes Nancy Marus, MD  Omega-3 1000 MG CAPS Take 3 g by mouth daily.    Yes Historical Provider, MD  ondansetron (ZOFRAN) 8 MG tablet Take 8 mg by mouth every 8 (eight) hours as needed for nausea or vomiting.    Yes Historical Provider, MD  OVER THE COUNTER MEDICATION Take 1  packet by mouth 2 (two) times daily. Nutralite multi  Vitamins   Yes Historical Provider, MD  OVER THE COUNTER MEDICATION Take 1 tablet by mouth 2 (two) times daily. Glucose Health Cromium Piccolate   Yes Historical Provider, MD  oxyCODONE-acetaminophen (PERCOCET/ROXICET) 5-325 MG per tablet Take 2 tablets by mouth every 4 (four) hours as needed for severe pain. 03/27/14  Yes Kaitlyn Szekalski, PA-C  predniSONE (DELTASONE) 1 MG tablet Take 7 mg by mouth daily. 02/11/14  Yes Historical Provider, MD  Probiotic Product (PROBIOTIC DAILY PO) Take 1 tablet by  mouth daily.    Yes Historical Provider, MD  promethazine (PHENERGAN) 25 MG tablet Take 12.5 mg by mouth every 6 (six) hours as needed for nausea or vomiting.    Yes Historical Provider, MD  ranitidine (ZANTAC) 150 MG tablet Take 150 mg by mouth 2 (two) times daily.  11/29/13  Yes Historical Provider, MD  rOPINIRole (REQUIP) 0.25 MG tablet TAKE ONE TABLET BY MOUTH NIGHTLY AT BEDTIME  04/09/14  Yes Wardell Honour, MD  traMADol (ULTRAM) 50 MG tablet Take 1 tablet (50 mg total) by mouth every 6 (six) hours as needed. Patient taking differently: Take 50 mg by mouth every 6 (six) hours as needed for moderate pain or severe pain.  02/11/14  Yes Wardell Honour, MD  atorvastatin (LIPITOR) 10 MG tablet Take 1 tablet (10 mg total) by mouth daily at 6 PM. Patient not taking: Reported on 06/09/2014 10/15/13   Wardell Honour, MD  diclofenac sodium (VOLTAREN) 1 % GEL Apply 4 g topically 4 (four) times daily. Patient not taking: Reported on 06/09/2014 03/11/14   Wardell Honour, MD  hydrocortisone (ANUSOL-HC) 25 MG suppository Place 1 suppository (25 mg total) rectally 2 (two) times daily. Patient not taking: Reported on 06/09/2014 02/13/14   Wardell Honour, MD  hydrocortisone 2.5 % ointment Apply topically 2 (two) times daily. Patient not taking: Reported on 06/09/2014 01/08/14   Wardell Honour, MD  silver sulfADIAZINE (SILVADENE) 1 % cream Apply 1 application topically daily.  Apply dime size thickness to affected area daily Patient not taking: Reported on 07/07/2014 09/03/13   Levern Kalka P Mark Hassey, DO     ROS: The patient denies  night sweats, unintentional weight loss, chest pain, palpitations, wheezing, dyspnea on exertion, nausea, vomiting, abdominal pain, dysuria, hematuria, melena, numbness, weakness, or tingling.   All other systems have been reviewed and were otherwise negative with the exception of those mentioned in the HPI and as above.    PHYSICAL EXAM: Filed Vitals:   07/07/14 1153  BP: 120/72  Pulse: 91  Temp: 98.2 F (36.8 C)  Resp: 20   Filed Vitals:   07/07/14 1153  Height: 5\' 5"  (1.651 m)  Weight: 264 lb 4 oz (119.863 kg)   Body mass index is 43.97 kg/(m^2).   General: Alert, no acute distress, morbidly obese HEENT:  Normocephalic, atraumatic, oropharynx patent. EOMI, PERRLA Cardiovascular:  Regular rate and rhythm, no rubs murmurs or gallops.  No Carotid bruits, radial pulse intact. + pedal edema.  Respiratory: Clear to auscultation bilaterally.  No wheezes, rales, or rhonchi.  No cyanosis, no use of accessory musculature GI: No organomegaly, abdomen is soft and non-tender, positive bowel sounds.  No masses. Skin: + cellulitis, erythematous, warmth, no dc on lower right leg Neurologic: Facial musculature symmetric. Psychiatric: Patient is appropriate throughout our interaction. Lymphatic: No cervical lymphadenopathy Musculoskeletal: Gait intact.   LABS: Results for orders placed or performed in visit on 07/07/14  POCT CBC  Result Value Ref Range   WBC 8.9 4.6 - 10.2 K/uL   Lymph, poc 2.8 0.6 - 3.4   POC LYMPH PERCENT 31.0 10 - 50 %L   MID (cbc) 0.6 0 - 0.9   POC MID % 7.2 0 - 12 %M   POC Granulocyte 5.5 2 - 6.9   Granulocyte percent 61.8 37 - 80 %G   RBC 3.91 (A) 4.04 - 5.48 M/uL   Hemoglobin 12.0 (A) 12.2 - 16.2 g/dL   HCT, POC 35.7 (A) 37.7 - 47.9 %  MCV 91.2 80 - 97 fL   MCH, POC 30.8 27 - 31.2 pg   MCHC 33.7 31.8 - 35.4 g/dL    RDW, POC 13.4 %   Platelet Count, POC 391 142 - 424 K/uL   MPV 7.4 0 - 99.8 fL     EKG/XRAY:   Primary read interpreted by Dr. Marin Comment at [facility].   ASSESSMENT/PLAN: Encounter Diagnoses  Name Primary?  . Type 2 diabetes mellitus with diabetic polyneuropathy Yes  . Adrenal insufficiency   . Cellulitis of right leg    This is a pleasant 39 year old obese female who has a past medical history of pituitary  tumor status post resection with adrenal insufficiency, type 2 diabetes, right leg cellulitis who is on chronic steroids. She received an injection of Rocephin 1 g time for possible worsening cellulitis infection sicne will be traveling.  She also received Bactrim DS for cellulitis We will follow-up as needed Labs pending  Gross sideeffects, risk and benefits, and alternatives of medications d/w patient. Patient is aware that all medications have potential sideeffects and we are unable to predict every sideeffect or drug-drug interaction that may occur.  Kelli Allen, [place], DO 07/07/2014 2:16 PM

## 2014-08-07 ENCOUNTER — Ambulatory Visit (INDEPENDENT_AMBULATORY_CARE_PROVIDER_SITE_OTHER): Payer: BLUE CROSS/BLUE SHIELD

## 2014-08-07 ENCOUNTER — Ambulatory Visit (INDEPENDENT_AMBULATORY_CARE_PROVIDER_SITE_OTHER): Payer: BLUE CROSS/BLUE SHIELD | Admitting: Family Medicine

## 2014-08-07 VITALS — BP 128/84 | HR 90 | Temp 98.1°F | Resp 20 | Ht 64.5 in | Wt 263.2 lb

## 2014-08-07 DIAGNOSIS — R059 Cough, unspecified: Secondary | ICD-10-CM

## 2014-08-07 DIAGNOSIS — L03119 Cellulitis of unspecified part of limb: Secondary | ICD-10-CM

## 2014-08-07 DIAGNOSIS — R52 Pain, unspecified: Secondary | ICD-10-CM | POA: Diagnosis not present

## 2014-08-07 DIAGNOSIS — R5381 Other malaise: Secondary | ICD-10-CM

## 2014-08-07 DIAGNOSIS — R05 Cough: Secondary | ICD-10-CM

## 2014-08-07 LAB — POCT CBC
GRANULOCYTE PERCENT: 55.6 % (ref 37–80)
HEMATOCRIT: 33.8 % — AB (ref 37.7–47.9)
HEMOGLOBIN: 11.5 g/dL — AB (ref 12.2–16.2)
Lymph, poc: 2.7 (ref 0.6–3.4)
MCH: 30.7 pg (ref 27–31.2)
MCHC: 34 g/dL (ref 31.8–35.4)
MCV: 90.2 fL (ref 80–97)
MID (CBC): 0.8 (ref 0–0.9)
MPV: 7.5 fL (ref 0–99.8)
PLATELET COUNT, POC: 374 10*3/uL (ref 142–424)
POC GRANULOCYTE: 4.4 (ref 2–6.9)
POC LYMPH %: 34 % (ref 10–50)
POC MID %: 10.4 % (ref 0–12)
RBC: 3.75 M/uL — AB (ref 4.04–5.48)
RDW, POC: 12.7 %
WBC: 8 10*3/uL (ref 4.6–10.2)

## 2014-08-07 NOTE — Patient Instructions (Signed)
I do not see any cause of your symptoms today, except I wonder if you will need to go back on a little prednisone Please see your endocrinology provider today as planned and let me know if he thinks this is NOT related to cortisol

## 2014-08-07 NOTE — Progress Notes (Signed)
[facility] [address], [place] [address] [phone]  Date:  08/07/2014   Name:  Kelli Allen   DOB:  1975/05/31   MRN:  [id number]  PCP:  Reginia Forts, MD    Chief Complaint: Fatigue; Generalized Body Aches; and Cough   History of Present Illness:  Kelli Allen is a 39 y.o. very pleasant female patient who presents with the following:  Rather complicated pmhx of DM, obesity, HTN, recurrent celluliits, and cushing's disease.    She had been maintained on long term low dose of prednisone until recently   Here today with possible illness.  She has noted sx for about 2 weeks- she feels tired, notes body aches.  "I hurt everywhere."  She notes aches in her neck, her right wrist has been painful - however she has been dx with deQuervain and has a brace for this particular problem     She normally goes to [place] every weekend but has been too ill to go for a couple of weeks.  Plans to make the trip in about 8 days and is concerned that she will be too weak.   She has had persistent cellulitis of her legs in the past, but they look good now.   She was taking 3 mg of prednisone- she stopped taking this about 2.5 weeks ago.    He endocrinologist did a cortisol stim test 2 days ago and this looked ok per his report- she was told that she does NOT need to go back on prednisone at this time  Melton Alar, PA-C with [facility] is her endocrinology provider.  He is seeing him this afternoon as well.    She has noted a cough and some PND.  She is having some dry mouth- this had been better for a while but is worse again.  She is drinking a lot of water but still notes a dry mouth.    Lab Results  Component Value Date   HGBA1C 8.8* 03/02/2014   She had an A1c at [facility] in April at 7%.   Patient Active Problem List   Diagnosis Date Noted  . Osteopenia 04/24/2014  . Cellulitis of right lower extremity   . Hyponatremia   . Blood poisoning   .  Cellulitis 03/02/2014  . Type 2 diabetes mellitus with diabetic polyneuropathy 01/13/2014  . Sinus tachycardia by electrocardiogram 01/01/2014  . Disease of pituitary gland 11/25/2013  . Cushing's disease 10/15/2013  . ACTH excess, central 10/15/2013  . Endometrial cancer 07/03/2012  . Elevated cholesterol with high triglycerides 03/02/2011  . Cellulitis, leg 02/26/2011  . Diabetes mellitus type 2, uncontrolled, without complications 34/19/3790  . HTN (hypertension) 02/26/2011  . POLYCYSTIC OVARY 03/08/2006  . Obesity 03/08/2006  . Asthma 03/08/2006  . Deficiency, disaccharidase intestinal 03/08/2006    Past Medical History  Diagnosis Date  . Cellulitis of lower leg     x3 on right leg  . Hypertension   . Irregular menstrual bleeding   . PCOS (polycystic ovarian syndrome)   . Tachycardia   . Abnormal Pap smear 2000  . Endometrial ca 12/20/2011  . Asthma     no issues in years  . Family history of anesthesia complication     sister had hard time waking up and problems breathing  . Cushing's syndrome 09/25/2013    MRI done on 10/02/2013  . 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 Diabetes  mellitus   . Anxiety   . Neuromuscular disorder     DIABETIC NEUROPATHY    Past Surgical History  Procedure Laterality Date  . Hysteroscopy w/d&c  01/24/2012    Procedure: DILATATION AND CURETTAGE /HYSTEROSCOPY;  Surgeon: Betsy Coder, MD;  Location: [facility];  Service: Gynecology;  Laterality: N/A;  with vulvar biopsies   . Intrauterine device (iud) insertion  01/24/2012    Procedure: INTRAUTERINE DEVICE (IUD) INSERTION;  Surgeon: Betsy Coder, MD;  Location: [facility];  Service: Gynecology;  Laterality: N/A;  . Dilation and curettage of uterus    . Robotic assisted total hysterectomy with bilateral salpingo oopherectomy Bilateral 07/02/2012    Procedure: ROBOTIC ASSISTED TOTAL HYSTERECTOMY WITH BILATERAL SALPINGO  OOPHORECTOMY ;  Surgeon: Imagene Gurney A. Alycia Rossetti, MD;  Location: [facility];  Service: Gynecology;  Laterality: Bilateral;  . Abdominal hysterectomy    . Transsphenoid hypophysectomy  11/26/2013    ACTH secreting pituitary lesion causing Cushing's; [facility]; Angelene Giovanni, MD    History  Substance Use Topics  . Smoking status: Never Smoker   . Smokeless tobacco: Never Used  . Alcohol Use: 0.0 oz/week    0 Standard drinks or equivalent per week     Comment: rare    Family History  Problem Relation Age of Onset  . Diabetes type II Mother   . Coronary artery disease Mother   . Diabetes Mother   . Heart disease Mother   . Hyperlipidemia Mother   . Hypertension Mother   . Diabetes type II Father   . Coronary artery disease Father   . Diabetes Father   . Heart disease Father   . Hyperlipidemia Father   . Heart disease Sister   . Diabetes Sister     Allergies  Allergen Reactions  . Antibacterial Hand Soap [Triclosan] Other (See Comments) and Rash    Dry, itchy patches on skin    Medication list has been reviewed and updated.  Current Outpatient Prescriptions on File Prior to Visit  Medication Sig Dispense Refill  . albuterol (PROAIR HFA) 108 (90 BASE) MCG/ACT inhaler Inhale 2 puffs into the lungs every 6 (six) hours as needed for shortness of breath. 18 g 1  . albuterol (PROVENTIL) (2.5 MG/3ML) 0.083% nebulizer solution Take 2.5 mg by nebulization every 6 (six) hours as needed for wheezing or shortness of breath.    Marland Kitchen BAYER CONTOUR TEST test strip   98  . cyclobenzaprine (FLEXERIL) 10 MG tablet Take 1 tablet (10 mg total) by mouth 2 (two) times daily as needed for muscle spasms. 20 tablet 0  . Ergocalciferol (VITAMIN D2) 2000 UNITS TABS Take 1 tablet by mouth daily.     Marland Kitchen FLUoxetine (PROZAC) 20 MG tablet Take 2 tablets (40 mg total) by mouth daily. 60 tablet 5  . gabapentin (NEURONTIN) 300 MG capsule Take 1-2 capsules (300-600 mg total) by mouth 3 (three) times daily. 120 capsule 5   . insulin aspart (NOVOLOG FLEXPEN) 100 UNIT/ML FlexPen Inject 22 units with a correction 1:50 > 150 three times daily before meals    . Insulin Glargine (LANTUS SOLOSTAR) 100 UNIT/ML Solostar Pen Inject 80 Units into the skin at bedtime.     Marland Kitchen labetalol (NORMODYNE) 200 MG tablet Take 1 tablet (200 mg total) by mouth 2 (two) times daily. 60 tablet 4  . Liraglutide (VICTOZA) 18 MG/3ML SOPN Inject 1.8 mg into the skin daily.     Marland Kitchen lisinopril-hydrochlorothiazide (PRINZIDE,ZESTORETIC) 10-12.5 MG per tablet Take 1 tablet by  mouth daily. 90 tablet 3  . LORazepam (ATIVAN) 0.5 MG tablet Take 1 tablet (0.5 mg total) by mouth 2 (two) times daily as needed for anxiety. (Patient taking differently: Take 0.5 mg by mouth daily. ) 45 tablet 2  . metformin (FORTAMET) 500 MG (OSM) 24 hr tablet Take 1,000 mg by mouth 2 (two) times daily with a meal.     . Multiple Vitamin (MULTIVITAMIN) capsule Take 1 capsule by mouth 2 (two) times daily.     Marland Kitchen nystatin (MYCOSTATIN) powder Apply topically 4 (four) times daily. 30 g 3  . Omega-3 1000 MG CAPS Take 3 g by mouth daily.     . ondansetron (ZOFRAN) 8 MG tablet Take 8 mg by mouth every 8 (eight) hours as needed for nausea or vomiting.     Marland Kitchen OVER THE COUNTER MEDICATION Take 1 packet by mouth 2 (two) times daily. Nutralite multi  Vitamins    . OVER THE COUNTER MEDICATION Take 1 tablet by mouth 2 (two) times daily. Glucose Health Cromium Piccolate    . oxyCODONE-acetaminophen (PERCOCET/ROXICET) 5-325 MG per tablet Take 2 tablets by mouth every 4 (four) hours as needed for severe pain. 15 tablet 0  . Probiotic Product (PROBIOTIC DAILY PO) Take 1 tablet by mouth daily.     . promethazine (PHENERGAN) 25 MG tablet Take 12.5 mg by mouth every 6 (six) hours as needed for nausea or vomiting.     . ranitidine (ZANTAC) 150 MG tablet Take 150 mg by mouth 2 (two) times daily.     Marland Kitchen rOPINIRole (REQUIP) 0.25 MG tablet TAKE ONE TABLET BY MOUTH NIGHTLY AT BEDTIME 30 tablet 0  .  sulfamethoxazole-trimethoprim (BACTRIM DS,SEPTRA DS) 800-160 MG per tablet Take 1 tablet by mouth 2 (two) times daily. 20 tablet 0  . atorvastatin (LIPITOR) 10 MG tablet Take 1 tablet (10 mg total) by mouth daily at 6 PM. (Patient not taking: Reported on 06/09/2014) 90 tablet 3  . diclofenac sodium (VOLTAREN) 1 % GEL Apply 4 g topically 4 (four) times daily. (Patient not taking: Reported on 06/09/2014) 100 g 2  . hydrocortisone (ANUSOL-HC) 25 MG suppository Place 1 suppository (25 mg total) rectally 2 (two) times daily. (Patient not taking: Reported on 06/09/2014) 12 suppository 3  . hydrocortisone 2.5 % ointment Apply topically 2 (two) times daily. (Patient not taking: Reported on 06/09/2014) 30 g 1  . predniSONE (DELTASONE) 1 MG tablet Take 7 mg by mouth daily.    . silver sulfADIAZINE (SILVADENE) 1 % cream Apply 1 application topically daily. Apply dime size thickness to affected area daily (Patient not taking: Reported on 07/07/2014) 50 g 0  . traMADol (ULTRAM) 50 MG tablet Take 1 tablet (50 mg total) by mouth every 6 (six) hours as needed. (Patient not taking: Reported on 08/07/2014) 60 tablet 2   No current facility-administered medications on file prior to visit.    Review of Systems:  As per HPI- otherwise negative.   Physical Examination: Filed Vitals:   08/07/14 1139  BP: 128/84  Pulse: 103  Temp: 98.1 F (36.7 C)  Resp: 20   Filed Vitals:   08/07/14 1139  Height: 5' 4.5" (1.638 m)  Weight: 263 lb 3.2 oz (119.387 kg)   Body mass index is 44.5 kg/(m^2). Ideal Body Weight: Weight in (lb) to have BMI = 25: 147.6  GEN: WDWN, NAD, Non-toxic, A & O x 3, obese, cushionoid appearance but OW looks well HEENT: Atraumatic, Normocephalic. Neck supple. No masses, No LAD. Ears and  Nose: No external deformity. CV: RRR, No M/G/R. No JVD. No thrill. No extra heart sounds. PULM: CTA B, no wheezes, crackles, rhonchi. No retractions. No resp. distress. No accessory muscle use. EXTR: No  c/c/e NEURO Normal gait.  PSYCH: Normally interactive. Conversant. Not depressed or anxious appearing.  Calm demeanor.  No evidence of current cellulitis of her LE.  She has some post- inflammatory hyperpigmentation of the right shin but no redness, tenderness or swelling, or heat  [facility] reading (PRIMARY) by  Dr. Mady Gemma. CXR: negative CHEST 2 VIEW  COMPARISON: 08/23/2013  FINDINGS: The cardiomediastinal silhouette is unremarkable.  There is no evidence of focal airspace disease, pulmonary edema, suspicious pulmonary nodule/mass, pleural effusion, or pneumothorax. No acute bony abnormalities are identified.  IMPRESSION: No active cardiopulmonary disease.  Results for orders placed or performed in visit on 08/07/14  POCT CBC  Result Value Ref Range   WBC 8.0 4.6 - 10.2 K/uL   Lymph, poc 2.7 0.6 - 3.4   POC LYMPH PERCENT 34.0 10 - 50 %L   MID (cbc) 0.8 0 - 0.9   POC MID % 10.4 0 - 12 %M   POC Granulocyte 4.4 2 - 6.9   Granulocyte percent 55.6 37 - 80 %G   RBC 3.75 (A) 4.04 - 5.48 M/uL   Hemoglobin 11.5 (A) 12.2 - 16.2 g/dL   HCT, POC 33.8 (A) 37.7 - 47.9 %   MCV 90.2 80 - 97 fL   MCH, POC 30.7 27 - 31.2 pg   MCHC 34.0 31.8 - 35.4 g/dL   RDW, POC 12.7 %   Platelet Count, POC 374 142 - 424 K/uL   MPV 7.5 0 - 99.8 fL    Assessment and Plan: Malaise - Plan: DG Chest 2 View  Cough - Plan: DG Chest 2 View  Recurrent cellulitis of lower leg - Plan: POCT CBC  Body aches - Plan: Sedimentation Rate, C-reactive protein, Rheumatoid factor, ANA  Here today with symptoms of just not feeling quite herself, feeling tired and more achy than usual She has been on prednisone for a long time- recent testing per endocrine showed that her systemic cortisol is ok, but suspect she is still feeling this adjustment.  Reassured that she does not have any sign of recurrent cellulitis at this time She will see her endocrinology provider today and will discuss with him- ?should she go back on  pred for a few days She will keep me posted as to her progress and will be in touch pending the rest of her labs   Signed Lamar Blinks, MD

## 2014-08-08 LAB — RHEUMATOID FACTOR

## 2014-08-08 LAB — SEDIMENTATION RATE: Sed Rate: 38 mm/hr — ABNORMAL HIGH (ref 0–20)

## 2014-08-08 LAB — C-REACTIVE PROTEIN: CRP: <0.5 mg/dL (ref <0.60)

## 2014-08-10 ENCOUNTER — Ambulatory Visit (INDEPENDENT_AMBULATORY_CARE_PROVIDER_SITE_OTHER): Payer: BLUE CROSS/BLUE SHIELD | Admitting: Family Medicine

## 2014-08-10 ENCOUNTER — Encounter: Payer: Self-pay | Admitting: Family Medicine

## 2014-08-10 VITALS — BP 106/66 | HR 104 | Temp 98.5°F | Resp 16 | Ht 65.0 in | Wt 260.6 lb

## 2014-08-10 DIAGNOSIS — IMO0001 Reserved for inherently not codable concepts without codable children: Secondary | ICD-10-CM

## 2014-08-10 DIAGNOSIS — I10 Essential (primary) hypertension: Secondary | ICD-10-CM | POA: Diagnosis not present

## 2014-08-10 DIAGNOSIS — M25551 Pain in right hip: Secondary | ICD-10-CM

## 2014-08-10 DIAGNOSIS — R000 Tachycardia, unspecified: Secondary | ICD-10-CM

## 2014-08-10 DIAGNOSIS — E240 Pituitary-dependent Cushing's disease: Secondary | ICD-10-CM

## 2014-08-10 DIAGNOSIS — E669 Obesity, unspecified: Secondary | ICD-10-CM | POA: Diagnosis not present

## 2014-08-10 DIAGNOSIS — M25561 Pain in right knee: Secondary | ICD-10-CM | POA: Diagnosis not present

## 2014-08-10 DIAGNOSIS — F4323 Adjustment disorder with mixed anxiety and depressed mood: Secondary | ICD-10-CM

## 2014-08-10 DIAGNOSIS — D649 Anemia, unspecified: Secondary | ICD-10-CM | POA: Diagnosis not present

## 2014-08-10 DIAGNOSIS — E1142 Type 2 diabetes mellitus with diabetic polyneuropathy: Secondary | ICD-10-CM

## 2014-08-10 LAB — CBC WITH DIFFERENTIAL/PLATELET
Basophils Absolute: 0.1 10*3/uL (ref 0.0–0.1)
Basophils Relative: 1 % (ref 0–1)
Eosinophils Absolute: 1.3 10*3/uL — ABNORMAL HIGH (ref 0.0–0.7)
Eosinophils Relative: 13 % — ABNORMAL HIGH (ref 0–5)
HEMATOCRIT: 34.1 % — AB (ref 36.0–46.0)
HEMOGLOBIN: 11.5 g/dL — AB (ref 12.0–15.0)
LYMPHS ABS: 3.1 10*3/uL (ref 0.7–4.0)
LYMPHS PCT: 32 % (ref 12–46)
MCH: 30.7 pg (ref 26.0–34.0)
MCHC: 33.7 g/dL (ref 30.0–36.0)
MCV: 91.2 fL (ref 78.0–100.0)
MPV: 9.9 fL (ref 8.6–12.4)
Monocytes Absolute: 0.7 10*3/uL (ref 0.1–1.0)
Monocytes Relative: 7 % (ref 3–12)
NEUTROS PCT: 47 % (ref 43–77)
Neutro Abs: 4.6 10*3/uL (ref 1.7–7.7)
PLATELETS: 403 10*3/uL — AB (ref 150–400)
RBC: 3.74 MIL/uL — ABNORMAL LOW (ref 3.87–5.11)
RDW: 13.2 % (ref 11.5–15.5)
WBC: 9.7 10*3/uL (ref 4.0–10.5)

## 2014-08-10 LAB — FERRITIN: FERRITIN: 167 ng/mL (ref 10–291)

## 2014-08-10 LAB — IBC PANEL
%SAT: 23 % (ref 20–55)
TIBC: 347 ug/dL (ref 250–470)
UIBC: 266 ug/dL (ref 125–400)

## 2014-08-10 LAB — FOLATE

## 2014-08-10 LAB — VITAMIN B12: Vitamin B-12: 938 pg/mL — ABNORMAL HIGH (ref 211–911)

## 2014-08-10 LAB — IRON: Iron: 81 ug/dL (ref 42–145)

## 2014-08-10 LAB — ANA: Anti Nuclear Antibody(ANA): NEGATIVE

## 2014-08-10 NOTE — Progress Notes (Signed)
Subjective:    Patient ID: Kelli Allen, female    DOB: September 24, 1975, 39 y.o.   MRN: [id number]  08/10/2014  Follow-up   HPI This 39 y.o. female presents for evaluation of the following:  1.  DMII: HgbA1c 6.1.  Lowest reading ever.  S/p follow-up with endocrinology last week.   2.  Cushings: now off of prednisone for the past three weeks.  Feels poorly; having arthralgias and horrible fatigue.  S/p evaluation by endocrinology last week.    3. Anemia: Hgb is trending down some.  Recommended follow-up with PCP for work up.  S/p hysterectomy.  No bloody stools.  No melena. No n/v/d/c.  No abdominal pain.    4. Arthralgias:  Dr. Lorelei Pont obtained sedimentation rate, CRP, ANA, RF.  Sedimentation rate elevated but all other labs negative.  Wrist pain and coccyx pain persists.  Feeling some better now.  Has a really hard time every morning.  Then improves; pain waxes and wanes throughout the day.  Taking energy juice concentrate; ginseng with extra B12.  Knee pain recurred.  Took Tramadol and pain seemed to worsen.  Did take oxycodone this weekend.  Finished prednisone 3 weeks tomorrow.  Stopped 3mg  abruptly.   5. Fatigue: also suffering with fatigue; stayed down all weekend.  Traveling to [place] next week.  Has some tri-ron -folic.     Review of Systems  Constitutional: Positive for fatigue. Negative for fever, chills and diaphoresis.  Eyes: Negative for visual disturbance.  Respiratory: Negative for cough and shortness of breath.   Cardiovascular: Negative for chest pain, palpitations and leg swelling.  Gastrointestinal: Negative for nausea, vomiting, abdominal pain, diarrhea and constipation.  Endocrine: Negative for cold intolerance, heat intolerance, polydipsia, polyphagia and polyuria.  Musculoskeletal: Positive for myalgias and arthralgias.  Skin: Negative for color change, pallor, rash and wound.  Neurological: Negative for dizziness, tremors, seizures, syncope, facial asymmetry, speech  difficulty, weakness, light-headedness, numbness and headaches.  Psychiatric/Behavioral: Negative for suicidal ideas, sleep disturbance, self-injury and dysphoric mood. The patient is nervous/anxious.     Past Medical History  Diagnosis Date  . Cellulitis of lower leg     x3 on right leg  . Hypertension   . Irregular menstrual bleeding   . PCOS (polycystic ovarian syndrome)   . Tachycardia   . Abnormal Pap smear 2000  . Endometrial ca 12/20/2011  . Asthma     no issues in years  . Family history of anesthesia complication     sister had hard time waking up and problems breathing  . Cushing's syndrome 09/25/2013    MRI done on 10/02/2013  . 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 Diabetes mellitus   . Anxiety   . Neuromuscular disorder     DIABETIC NEUROPATHY   Past Surgical History  Procedure Laterality Date  . Hysteroscopy w/d&c  01/24/2012    Procedure: DILATATION AND CURETTAGE /HYSTEROSCOPY;  Surgeon: Betsy Coder, MD;  Location: [facility];  Service: Gynecology;  Laterality: N/A;  with vulvar biopsies   . Intrauterine device (iud) insertion  01/24/2012    Procedure: INTRAUTERINE DEVICE (IUD) INSERTION;  Surgeon: Betsy Coder, MD;  Location: [facility];  Service: Gynecology;  Laterality: N/A;  . Dilation and curettage of uterus    . Robotic assisted total hysterectomy with bilateral salpingo oopherectomy Bilateral 07/02/2012    Procedure: ROBOTIC ASSISTED TOTAL HYSTERECTOMY WITH BILATERAL SALPINGO OOPHORECTOMY ;  Surgeon: Imagene Gurney A. Alycia Rossetti, MD;  Location: [facility];  Service: Gynecology;  Laterality: Bilateral;  . Abdominal hysterectomy    . Transsphenoid hypophysectomy  11/26/2013    ACTH secreting pituitary lesion causing Cushing's; [facility]; Angelene Giovanni, MD   Allergies  Allergen Reactions  . Antibacterial Hand Soap [Triclosan] Other (See Comments) and Rash    Dry, itchy patches on skin   Current  Outpatient Prescriptions  Medication Sig Dispense Refill  . albuterol (PROAIR HFA) 108 (90 BASE) MCG/ACT inhaler Inhale 2 puffs into the lungs every 6 (six) hours as needed for shortness of breath. 18 g 1  . albuterol (PROVENTIL) (2.5 MG/3ML) 0.083% nebulizer solution Take 2.5 mg by nebulization every 6 (six) hours as needed for wheezing or shortness of breath.    . cyclobenzaprine (FLEXERIL) 10 MG tablet Take 1 tablet (10 mg total) by mouth 2 (two) times daily as needed for muscle spasms. 20 tablet 0  . Ergocalciferol (VITAMIN D2) 2000 UNITS TABS Take 1 tablet by mouth daily.     Marland Kitchen FLUoxetine (PROZAC) 20 MG tablet Take 2 tablets (40 mg total) by mouth daily. 60 tablet 5  . gabapentin (NEURONTIN) 300 MG capsule Take 1-2 capsules (300-600 mg total) by mouth 3 (three) times daily. 120 capsule 5  . hydrocortisone 2.5 % ointment Apply topically 2 (two) times daily. 30 g 1  . insulin aspart (NOVOLOG FLEXPEN) 100 UNIT/ML FlexPen Inject 22 units with a correction 1:50 > 150 three times daily before meals    . Insulin Glargine (LANTUS SOLOSTAR) 100 UNIT/ML Solostar Pen Inject 80 Units into the skin at bedtime.     Marland Kitchen labetalol (NORMODYNE) 200 MG tablet Take 1 tablet (200 mg total) by mouth 2 (two) times daily. 60 tablet 4  . Liraglutide (VICTOZA) 18 MG/3ML SOPN Inject 1.8 mg into the skin daily.     Marland Kitchen lisinopril-hydrochlorothiazide (PRINZIDE,ZESTORETIC) 10-12.5 MG per tablet Take 1 tablet by mouth daily. 90 tablet 3  . LORazepam (ATIVAN) 0.5 MG tablet Take 1 tablet (0.5 mg total) by mouth 2 (two) times daily as needed for anxiety. (Patient taking differently: Take 0.5 mg by mouth daily. ) 45 tablet 2  . metformin (FORTAMET) 500 MG (OSM) 24 hr tablet Take 1,000 mg by mouth 2 (two) times daily with a meal.     . Multiple Vitamin (MULTIVITAMIN) capsule Take 1 capsule by mouth 2 (two) times daily.     Marland Kitchen nystatin (MYCOSTATIN) powder Apply topically 4 (four) times daily. 30 g 3  . Omega-3 1000 MG CAPS Take 3 g by  mouth daily.     . ondansetron (ZOFRAN) 8 MG tablet Take 8 mg by mouth every 8 (eight) hours as needed for nausea or vomiting.     Marland Kitchen OVER THE COUNTER MEDICATION Take 1 packet by mouth 2 (two) times daily. Nutralite multi  Vitamins    . OVER THE COUNTER MEDICATION Take 1 tablet by mouth 2 (two) times daily. Glucose Health Cromium Piccolate    . oxyCODONE-acetaminophen (PERCOCET/ROXICET) 5-325 MG per tablet Take 2 tablets by mouth every 4 (four) hours as needed for severe pain. 15 tablet 0  . Probiotic Product (PROBIOTIC DAILY PO) Take 1 tablet by mouth daily.     . promethazine (PHENERGAN) 25 MG tablet Take 12.5 mg by mouth every 6 (six) hours as needed for nausea or vomiting.     Marland Kitchen rOPINIRole (REQUIP) 0.25 MG tablet TAKE ONE TABLET BY MOUTH NIGHTLY AT BEDTIME 30 tablet 0  . traMADol (ULTRAM) 50 MG tablet Take 1 tablet (  50 mg total) by mouth every 6 (six) hours as needed. 60 tablet 2  . atorvastatin (LIPITOR) 10 MG tablet Take 1 tablet (10 mg total) by mouth daily at 6 PM. (Patient not taking: Reported on 06/09/2014) 90 tablet 3  . BAYER CONTOUR TEST test strip   98  . diclofenac sodium (VOLTAREN) 1 % GEL Apply 4 g topically 4 (four) times daily. (Patient not taking: Reported on 06/09/2014) 100 g 2  . hydrocortisone (ANUSOL-HC) 25 MG suppository Place 1 suppository (25 mg total) rectally 2 (two) times daily. (Patient not taking: Reported on 06/09/2014) 12 suppository 3  . predniSONE (DELTASONE) 1 MG tablet Take 7 mg by mouth daily.    . ranitidine (ZANTAC) 150 MG tablet Take 150 mg by mouth 2 (two) times daily.     . silver sulfADIAZINE (SILVADENE) 1 % cream Apply 1 application topically daily. Apply dime size thickness to affected area daily (Patient not taking: Reported on 07/07/2014) 50 g 0  . sulfamethoxazole-trimethoprim (BACTRIM DS,SEPTRA DS) 800-160 MG per tablet Take 1 tablet by mouth 2 (two) times daily. (Patient not taking: Reported on 08/10/2014) 20 tablet 0   No current facility-administered  medications for this visit.       Objective:    BP 106/66 mmHg  Pulse 104  Temp(Src) 98.5 F (36.9 C) (Oral)  Resp 16  Ht $R'5\' 5"'Kd$  (1.651 m)  Wt 260 lb 9.6 oz (118.207 kg)  BMI 43.37 kg/m2  LMP 01/17/2012 Physical Exam  Constitutional: She is oriented to person, place, and time. She appears well-developed and well-nourished. No distress.  obese  HENT:  Head: Normocephalic and atraumatic.  Right Ear: External ear normal.  Left Ear: External ear normal.  Nose: Nose normal.  Mouth/Throat: Oropharynx is clear and moist.  Eyes: Conjunctivae and EOM are normal. Pupils are equal, round, and reactive to light.  Neck: Normal range of motion. Neck supple. Carotid bruit is not present. No thyromegaly present.  Cardiovascular: Normal rate, regular rhythm, normal heart sounds and intact distal pulses.  Exam reveals no gallop and no friction rub.   No murmur heard. Pulmonary/Chest: Effort normal and breath sounds normal. She has no wheezes. She has no rales.  Abdominal: Soft. Bowel sounds are normal. She exhibits no distension and no mass. There is no tenderness. There is no rebound and no guarding.  Musculoskeletal:       Right shoulder: Normal.       Left shoulder: Normal.       Cervical back: Normal.       Thoracic back: Normal.       Lumbar back: Normal.  Lymphadenopathy:    She has no cervical adenopathy.  Neurological: She is alert and oriented to person, place, and time. No cranial nerve deficit.  Skin: Skin is warm and dry. No rash noted. She is not diaphoretic. No erythema. No pallor.  Psychiatric: She has a normal mood and affect. Her behavior is normal.   Results for orders placed or performed in visit on 08/10/14  CBC with Differential/Platelet  Result Value Ref Range   WBC 9.7 4.0 - 10.5 K/uL   RBC 3.74 (L) 3.87 - 5.11 MIL/uL   Hemoglobin 11.5 (L) 12.0 - 15.0 g/dL   HCT 34.1 (L) 36.0 - 46.0 %   MCV 91.2 78.0 - 100.0 fL   MCH 30.7 26.0 - 34.0 pg   MCHC 33.7 30.0 - 36.0  g/dL   RDW 13.2 11.5 - 15.5 %   Platelets 403 (H)  150 - 400 K/uL   MPV 9.9 8.6 - 12.4 fL   Neutrophils Relative % 47 43 - 77 %   Neutro Abs 4.6 1.7 - 7.7 K/uL   Lymphocytes Relative 32 12 - 46 %   Lymphs Abs 3.1 0.7 - 4.0 K/uL   Monocytes Relative 7 3 - 12 %   Monocytes Absolute 0.7 0.1 - 1.0 K/uL   Eosinophils Relative 13 (H) 0 - 5 %   Eosinophils Absolute 1.3 (H) 0.0 - 0.7 K/uL   Basophils Relative 1 0 - 1 %   Basophils Absolute 0.1 0.0 - 0.1 K/uL   Smear Review Criteria for review not met   Ferritin  Result Value Ref Range   Ferritin 167 10 - 291 ng/mL  Vitamin B12  Result Value Ref Range   Vitamin B-12 938 (H) 211 - 911 pg/mL  Iron  Result Value Ref Range   Iron 81 42 - 145 ug/dL  IBC panel  Result Value Ref Range   UIBC 266 125 - 400 ug/dL   TIBC 347 250 - 470 ug/dL   %SAT 23 20 - 55 %  Folate  Result Value Ref Range   Folate >20.0 ng/mL       Assessment & Plan:   1. Anemia, unspecified anemia type   2. Chronic arthralgias of knees and hips, right   3. Type 2 diabetes mellitus with diabetic polyneuropathy   4. Sinus tachycardia by electrocardiogram   5. Obesity   6. Essential hypertension   7. Adjustment disorder with mixed anxiety and depressed mood     1. Anemia: New.  Send iron studies, B12, Folate; stool cards provided to complete at home. Likely consistent with anemia of chronic disease yet with age, warrants rule out for GI bleeding. 2. Arthralgias: worsening due to cessation of Prednisone. S/p ANA and RF that are both negative. If no improvement in one month, refer to rheumatology to rule out associated autoimmune process. 3.  DMII: improving with cessation of prednisone and s/p surgical correction for Cushings. 4.  Obesity: improving slowly. 5. HTN: controlled. 6. Anxiety: increase Prozac to three daily during time of stress and with worsening pain from cessation of Prednisone.    No orders of the defined types were placed in this encounter.     Return in about 4 weeks (around 09/07/2014) for recheck.    Amer Alcindor Elayne Guerin, M.D. [facility] [address] [place], [place]  [address] [phone] phone [phone] fax

## 2014-08-11 MED ORDER — FLUOXETINE HCL 20 MG PO TABS: 60.0000 mg | ORAL_TABLET | Freq: Every day | ORAL | Status: DC

## 2014-09-24 ENCOUNTER — Other Ambulatory Visit: Payer: Self-pay | Admitting: Family Medicine

## 2014-09-25 ENCOUNTER — Other Ambulatory Visit: Payer: Self-pay

## 2014-09-25 MED ORDER — ROPINIROLE HCL 0.25 MG PO TABS: 0.2500 mg | ORAL_TABLET | Freq: Every day | ORAL | Status: DC

## 2014-10-28 ENCOUNTER — Other Ambulatory Visit: Payer: Self-pay | Admitting: Family Medicine

## 2014-10-29 ENCOUNTER — Telehealth: Payer: Self-pay

## 2014-10-29 MED ORDER — ROPINIROLE HCL 0.25 MG PO TABS: 0.2500 mg | ORAL_TABLET | Freq: Every day | ORAL | Status: DC

## 2014-10-29 NOTE — Telephone Encounter (Signed)
Pt would like a refill on her rOPINIRole (REQUIP) 0.25 MG tablet [[id number]]. She has moved, the pharmacy she would like [place] to use is [facility] in [place]. [address]. Please advise at [phone]

## 2014-10-29 NOTE — Telephone Encounter (Signed)
Rx sent to pharmacy   

## 2014-11-02 MED ORDER — ROPINIROLE HCL 0.25 MG PO TABS: 0.2500 mg | ORAL_TABLET | Freq: Every day | ORAL | Status: DC

## 2014-11-02 NOTE — Addendum Note (Signed)
Addended by: Wardell Honour on: 11/02/2014 04:45 PM   Modules accepted: Orders

## 2014-12-09 ENCOUNTER — Ambulatory Visit: Payer: BLUE CROSS/BLUE SHIELD | Admitting: Gynecologic Oncology

## 2014-12-14 ENCOUNTER — Encounter: Payer: Self-pay | Admitting: Family Medicine

## 2014-12-14 ENCOUNTER — Ambulatory Visit (INDEPENDENT_AMBULATORY_CARE_PROVIDER_SITE_OTHER): Payer: BLUE CROSS/BLUE SHIELD | Admitting: Family Medicine

## 2014-12-14 VITALS — BP 114/69 | HR 91 | Temp 98.4°F | Resp 16 | Ht 65.0 in | Wt 246.2 lb

## 2014-12-14 DIAGNOSIS — G2581 Restless legs syndrome: Secondary | ICD-10-CM

## 2014-12-14 DIAGNOSIS — I10 Essential (primary) hypertension: Secondary | ICD-10-CM | POA: Diagnosis not present

## 2014-12-14 DIAGNOSIS — I878 Other specified disorders of veins: Secondary | ICD-10-CM | POA: Diagnosis not present

## 2014-12-14 DIAGNOSIS — Z794 Long term (current) use of insulin: Secondary | ICD-10-CM

## 2014-12-14 DIAGNOSIS — E240 Pituitary-dependent Cushing's disease: Secondary | ICD-10-CM | POA: Diagnosis not present

## 2014-12-14 DIAGNOSIS — E1142 Type 2 diabetes mellitus with diabetic polyneuropathy: Secondary | ICD-10-CM | POA: Diagnosis not present

## 2014-12-14 DIAGNOSIS — R6882 Decreased libido: Secondary | ICD-10-CM

## 2014-12-14 DIAGNOSIS — L03115 Cellulitis of right lower limb: Secondary | ICD-10-CM

## 2014-12-14 DIAGNOSIS — D649 Anemia, unspecified: Secondary | ICD-10-CM | POA: Diagnosis not present

## 2014-12-14 DIAGNOSIS — E282 Polycystic ovarian syndrome: Secondary | ICD-10-CM

## 2014-12-14 LAB — COMPREHENSIVE METABOLIC PANEL
ALK PHOS: 106 U/L (ref 33–115)
ALT: 18 U/L (ref 6–29)
AST: 16 U/L (ref 10–30)
Albumin: 4.6 g/dL (ref 3.6–5.1)
BUN: 21 mg/dL (ref 7–25)
CO2: 27 mmol/L (ref 20–31)
Calcium: 9.8 mg/dL (ref 8.6–10.2)
Chloride: 99 mmol/L (ref 98–110)
Creat: 0.88 mg/dL (ref 0.50–1.10)
GLUCOSE: 75 mg/dL (ref 65–99)
Potassium: 4.5 mmol/L (ref 3.5–5.3)
Sodium: 135 mmol/L (ref 135–146)
Total Bilirubin: 0.4 mg/dL (ref 0.2–1.2)
Total Protein: 6.9 g/dL (ref 6.1–8.1)

## 2014-12-14 LAB — CBC WITH DIFFERENTIAL/PLATELET
Basophils Absolute: 0 10*3/uL (ref 0.0–0.1)
Basophils Relative: 0 % (ref 0–1)
EOS PCT: 4 % (ref 0–5)
Eosinophils Absolute: 0.3 10*3/uL (ref 0.0–0.7)
HCT: 35 % — ABNORMAL LOW (ref 36.0–46.0)
Hemoglobin: 11.6 g/dL — ABNORMAL LOW (ref 12.0–15.0)
LYMPHS ABS: 3 10*3/uL (ref 0.7–4.0)
Lymphocytes Relative: 40 % (ref 12–46)
MCH: 30.1 pg (ref 26.0–34.0)
MCHC: 33.1 g/dL (ref 30.0–36.0)
MCV: 90.7 fL (ref 78.0–100.0)
MPV: 9.9 fL (ref 8.6–12.4)
Monocytes Absolute: 0.5 10*3/uL (ref 0.1–1.0)
Monocytes Relative: 7 % (ref 3–12)
NEUTROS PCT: 49 % (ref 43–77)
Neutro Abs: 3.7 10*3/uL (ref 1.7–7.7)
Platelets: 370 10*3/uL (ref 150–400)
RBC: 3.86 MIL/uL — ABNORMAL LOW (ref 3.87–5.11)
RDW: 13.1 % (ref 11.5–15.5)
WBC: 7.6 10*3/uL (ref 4.0–10.5)

## 2014-12-14 LAB — IRON: Iron: 110 ug/dL (ref 40–190)

## 2014-12-14 NOTE — Progress Notes (Signed)
Subjective:    Patient ID: Kelli Allen, female    DOB: Oct 21, 1975, 39 y.o.   MRN: [id number]  12/14/2014  Edema and Depression   HPI This 39 y.o. female presents for evaluation of the following:  1.  Depression and anxiety:  Moved to [place]; currently unemployed.  Has been taking Prozac for the past year.  Sometimes will miss Prozac and does not do as well.  Has forgotten Ativan; has suffered with insomnia intermittent.  Not every night but it is often.  Mind racing.  Praying.  Taking 40m daily of Prozac; might miss two days per week.  Struggling with not working.  [place]  Had another job.  Has not found a job in eight weeks.  [place]  Not sure if depression evolving.  Trust God's work.  Had a job opportunity that seemed like a shoe in but has not heard a word.  Sister recently diagnosed with ADHD.  Has not been taking Lorazepam at all.    2. Cushings syndrome: off of Prednisone.    3.  Anemia:  Never completed stool cards after last visit in 08/2014.  Finally this week, followed dietary restrictions.    4. RLS: taking Requip.    5. DMII: taking two Gabapentin qhs.    6. RLE cellulitis: scratched leg recently.  Superficial scratch. No recent issues.  7.  Sinusitis: treated with abx; recent treatment.  No longer worried about leg cellulitis recurring when acutely ill.    8. Venous stasis: [place] past [place].    9.  Decreased libido:  Follow up with January gynecology.  Vaginal dryness.   Review of Systems  Constitutional: Negative for fever, chills, diaphoresis and fatigue.  Eyes: Negative for visual disturbance.  Respiratory: Negative for cough and shortness of breath.   Cardiovascular: Negative for chest pain, palpitations and leg swelling.  Gastrointestinal: Negative for nausea, vomiting, abdominal pain, diarrhea and constipation.  Endocrine: Negative for cold intolerance, heat intolerance,  polydipsia, polyphagia and polyuria.  Neurological: Negative for dizziness, tremors, seizures, syncope, facial asymmetry, speech difficulty, weakness, light-headedness, numbness and headaches.    Past Medical History  Diagnosis Date  . Cellulitis of lower leg     x3 on right leg  . Hypertension   . Irregular menstrual bleeding   . PCOS (polycystic ovarian syndrome)   . Tachycardia   . Abnormal Pap smear 2000  . [place]ometrial ca [place] 12/20/[place]  . Asthma     no issues in years  . Family history of anesthesia complication     sister had hard time waking up and problems breathing  . Cushing's syndrome ([place] 09/25/2013    MRI done on 10/02/2013  . [place] 11/26/2013    s/p transsphenoidal hypophysectomy for ACTH secreting pituitary lesion  . Diabetes mellitus   . Anxiety   . Neuromuscular disorder ([place]     DIABETIC NEUROPATHY   Past Surgical History  Procedure Laterality Date  . Hysteroscopy w/d&c  01/24/2012    Procedure: DILATATION AND CURETTAGE /HYSTEROSCOPY;  Surgeon: NBetsy Coder MD;  [facility];  Service: Gynecology;  Laterality: N/A;  with vulvar biopsies   . Intrauterine device (iud) insertion  01/24/2012    Procedure: INTRAUTERINE DEVICE (IUD) INSERTION;  Surgeon: NBetsy Coder MD;  [facility];  Service: Gynecology;  Laterality: N/A;  . Dilation and curettage of uterus    . Robotic assisted total hysterectomy with  bilateral salpingo oopherectomy Bilateral 07/02/2012    Procedure: ROBOTIC ASSISTED TOTAL HYSTERECTOMY WITH BILATERAL SALPINGO OOPHORECTOMY ;  Surgeon: Imagene Gurney A. Alycia Rossetti, MD;  [facility];  Service: Gynecology;  Laterality: Bilateral;  . Transsphenoid hypophysectomy  11/26/2013    ACTH secreting pituitary lesion causing Cushing's; [facility]; Angelene Giovanni, MD  . Abdominal hysterectomy     Allergies  Allergen Reactions  . Antibacterial Hand Soap [Triclosan] Other (See Comments) and Rash    Dry, itchy patches on skin     Social History   Social History  . Marital Status: Married    Spouse Name: N/A  . Number of Children: N/A  . Years of Education: N/A   Occupational History  . Not on file.   Social History Main Topics  . Smoking status: Never Smoker   . Smokeless tobacco: Never Used  . Alcohol Use: 0.0 oz/week    0 Standard drinks or equivalent per week     Comment: rare  . Drug Use: No  . Sexual Activity: Yes    Birth Control/ Protection: IUD     Comment: mirena    Other Topics Concern  . Not on file   Social History Narrative   Family History  Problem Relation Age of Onset  . Diabetes type II Mother   . Coronary artery disease Mother   . Diabetes Mother   . Heart disease Mother   . Hyperlipidemia Mother   . Hypertension Mother   . Diabetes type II Father   . Coronary artery disease Father   . Diabetes Father   . Heart disease Father   . Hyperlipidemia Father   . Heart disease Sister   . Diabetes Sister        Objective:    BP 114/69 mmHg  Pulse 91  Temp(Src) 98.4 F (36.9 C) (Oral)  Resp 16  Ht _0  (1.651 m)  Wt 246 lb 3.2 oz (111.676 kg)  BMI 40.97 kg/m2  LMP 01/17/2012 Physical Exam  Constitutional: She is oriented to person, place, and time. She appears well-developed and well-nourished. No distress.  HENT:  Head: Normocephalic and atraumatic.  Right Ear: External ear normal.  Left Ear: External ear normal.  Nose: Nose normal.  Mouth/Throat: Oropharynx is clear and moist.  Eyes: Conjunctivae and EOM are normal. Pupils are equal, round, and reactive to light.  Neck: Normal range of motion. Neck supple. Carotid bruit is not present. No thyromegaly present.  Cardiovascular: Normal rate, regular rhythm, normal heart sounds and intact distal pulses.  Exam reveals no gallop and no friction rub.   No murmur heard. Pulmonary/Chest: Effort normal and breath sounds normal. She has no wheezes. She has no rales.  Abdominal: Soft. Bowel sounds are normal. She  exhibits no distension and no mass. There is no tenderness. There is no rebound and no guarding.  Lymphadenopathy:    She has no cervical adenopathy.  Neurological: She is alert and oriented to person, place, and time. No cranial nerve deficit.  Skin: Skin is warm and dry. No rash noted. She is not diaphoretic. No erythema. No pallor.  Psychiatric: She has a normal mood and affect. Her behavior is normal.   Results for orders placed or performed in visit on 12/14/14  CBC with Differential/Platelet  Result Value Ref Range   WBC 7.6 4.0 - 10.5 K/uL   RBC 3.86 (L) 3.87 - 5.11 MIL/uL   Hemoglobin 11.6 (L) 12.0 - 15.0 g/dL   HCT 35.0 (L) 36.0 - 46.0 %  MCV 90.7 78.0 - 100.0 fL   MCH 30.1 26.0 - 34.0 pg   MCHC 33.1 30.0 - 36.0 g/dL   RDW 13.1 11.5 - 15.5 %   Platelets 370 150 - 400 K/uL   MPV 9.9 8.6 - 12.4 fL   Neutrophils Relative % 49 43 - 77 %   Neutro Abs 3.7 1.7 - 7.7 K/uL   Lymphocytes Relative 40 12 - 46 %   Lymphs Abs 3.0 0.7 - 4.0 K/uL   Monocytes Relative 7 3 - 12 %   Monocytes Absolute 0.5 0.1 - 1.0 K/uL   Eosinophils Relative 4 0 - 5 %   Eosinophils Absolute 0.3 0.0 - 0.7 K/uL   Basophils Relative 0 0 - 1 %   Basophils Absolute 0.0 0.0 - 0.1 K/uL   Smear Review Criteria for review not met   Iron  Result Value Ref Range   Iron 110 40 - 190 ug/dL  Comprehensive metabolic panel  Result Value Ref Range   Sodium 135 135 - 146 mmol/L   Potassium 4.5 3.5 - 5.3 mmol/L   Chloride 99 98 - 110 mmol/L   CO2 27 20 - 31 mmol/L   Glucose, Bld 75 65 - 99 mg/dL   BUN 21 7 - 25 mg/dL   Creat 0.88 0.50 - 1.10 mg/dL   Total Bilirubin 0.4 0.2 - 1.2 mg/dL   Alkaline Phosphatase 106 33 - 115 U/L   AST 16 10 - 30 U/L   ALT 18 6 - 29 U/L   Total Protein 6.9 6.1 - 8.1 g/dL   Albumin 4.6 3.6 - 5.1 g/dL   Calcium 9.8 8.6 - 10.2 mg/dL       Assessment & Plan:   1. Anemia, unspecified anemia type   2. Essential hypertension, benign   3. POLYCYSTIC OVARY   4. Type 2 diabetes  mellitus with diabetic polyneuropathy, with long-term current use of insulin ([place])   5. Cushing's disease ([place])   6. Cellulitis of right lower extremity   7. Venous stasis   8. Decreased libido   9. Restless leg syndrome     Orders Placed This Encounter  Procedures  . CBC with Differential/Platelet  . Iron  . Comprehensive metabolic panel  . IFOBT POC (occult bld, rslt in office)   No orders of the defined types were placed in this encounter.    Return in about 3 months (around 03/14/2015).    Chyanna Flock Elayne Guerin, M.D. [facility] [address] [place], [place]  [address] [phone] phone [phone] fax

## 2014-12-14 NOTE — Patient Instructions (Signed)
1. Restart Lorazepam at bedtime.

## 2014-12-29 ENCOUNTER — Other Ambulatory Visit: Payer: Self-pay | Admitting: Family Medicine

## 2015-01-12 ENCOUNTER — Encounter: Payer: Self-pay | Admitting: Family Medicine

## 2015-02-02 ENCOUNTER — Other Ambulatory Visit: Payer: Self-pay | Admitting: Family Medicine

## 2015-02-03 ENCOUNTER — Ambulatory Visit: Payer: BLUE CROSS/BLUE SHIELD | Attending: Gynecologic Oncology | Admitting: Gynecologic Oncology

## 2015-02-03 ENCOUNTER — Encounter: Payer: Self-pay | Admitting: Gynecologic Oncology

## 2015-02-03 VITALS — BP 123/72 | HR 73 | Temp 97.5°F | Resp 18 | Ht 65.0 in | Wt 248.2 lb

## 2015-02-03 DIAGNOSIS — C541 Malignant neoplasm of endometrium: Secondary | ICD-10-CM | POA: Insufficient documentation

## 2015-02-03 NOTE — Progress Notes (Signed)
Consult Note: Gyn-Onc  Kelli Allen 40 y.o. female with history of a stage IA grade 1 dx 6/14 endometrioid adenocarcinoma who has no evidence of recurrent disease.  Return to see me in 6 months or when necessary.  CC:  Chief Complaint  Patient presents with  . endometrial cancer    MD follow up visit    HPI:  Patient is a 40 year old gravida 0 para 0 who remembers having irregular periods in which he passed some tissue in October of 2012. She was seen by Dr. Charlesetta Garibaldi and apparently Dr. Charlesetta Garibaldi wanted to perform a history of sonogram but the patient declined. She was seen in followup with Dr. Charlesetta Garibaldi in September was started on birth control pills. In October or November she had some spotting and it was fairly regular for a few days. She ultimately underwent a hysteroscopy D&C that Dr. Charlesetta Garibaldi and 1 he to perform and underwent an endometrial biopsy on November 7. [name] 41-year-old a polyp. The uterus measures 8.04 x 4.3 cm with normal appearing adnexa. There was a 1.2 cm polyp noted. She had an endometrial biopsy that revealed a grade 1 endometrial carcinoma arising atypical complex hyperplasia.   She underwent a D&C IUD placement with Dr. Charlesetta Garibaldi on January 24, 2012. Pathology revealed an endometrioid adenocarcinoma grade 1 arising in a background of atypical complex hyperplasia. She also had some benign melanotic nevi removed at that time.  She got a second opinion with Dr. Gaetano Net who told her that the most definitive treatment would be hysterectomy.   I performed an endometrial biopsy on her in April 2014 that revealed persistent adenocarcinoma. She decided she wanted to proceed with hysterectomy.   On 07/02/2012 showing a robotic hysterectomy bilateral salpingo-oophorectomy. Pathology revealed: Diagnosis Uterus +/- tubes/ovaries, neoplastic, with cervix - ENDOMETRIOID CARCINOMA, FIGO GRADE I. - BACKGROUND ATYPICAL COMPLEX HYPERPLASIA AND A ENDOMETRIAL POLYP. PLEASE SEE  COMMENT. - BILATERAL OVARIES AND FALLOPIAN TUBES: NO EVIDENCE OF ATYPIA OR MALIGNANCY. - CERVIX: BENIGN SQUAMOUS MUCOSA AND ENDOCERVICAL MUCOSA, NO DYSPLASIA OR MALIGNANCY. - LEIOMYOMA.  I last saw her 5/16 at which time her exam and pap smear were normal. She comes in today for follow up. She has lost about 17# # since we last saw her.  She was hoping to have lost a little bit more but she had some bug bites over the holidays and had to be placed on a steroid taper that made her increased weight. She is progressively and steadily feeling better with progressive weight loss. Her last A1c was 6.2. She states occasionally she'll get some pimples on her breast. She's noticed a new one that feels a little bit different and she would like to have that evaluated today. She has been to [place]. Her husband has been living there for the past 4 years and she recently moved [place] day could be closer together. She has had some rectal itching and she thought it might be due to hemorrhoids. She used some Anusol suppositories and decrease the itching. There are no new medical problems and her family. She is due for her mammogram. Her last one was in December 2015.  She is very pleased that she's not had any further cellulitis of her right leg.  Review of Systems  Constitutional: Denies fever.  Skin: + rash under pannus, + pimple on right breast Cardiovascular: No chest pain, shortness of breath, improved LE edema  Pulmonary: No cough Gastro Intestinal:  No nausea, vomiting, constipation Genitourinary: No frequency, urgency, or  dysuria.  Denies vaginal bleeding Psychology: Doing well  Current Meds:  Outpatient Encounter Prescriptions as of 02/03/2015  Medication Sig  . albuterol (PROAIR HFA) 108 (90 BASE) MCG/ACT inhaler Inhale 2 puffs into the lungs every 6 (six) hours as needed for shortness of breath.  Marland Kitchen albuterol (PROVENTIL) (2.5 MG/3ML) 0.083% nebulizer solution Take 2.5 mg by nebulization every 6  (six) hours as needed for wheezing or shortness of breath.  Marland Kitchen atorvastatin (LIPITOR) 10 MG tablet Take 1 tablet (10 mg total) by mouth daily at 6 PM.  . BAYER CONTOUR TEST test strip   . Ergocalciferol (VITAMIN D2) 2000 UNITS TABS Take 1 tablet by mouth daily.   Marland Kitchen FLUoxetine (PROZAC) 20 MG tablet Take 3 tablets (60 mg total) by mouth daily.  Marland Kitchen gabapentin (NEURONTIN) 300 MG capsule TAKE 1-2 CAPSULES (300-600 MG TOTAL) BY MOUTH 3 (THREE) TIMES DAILY.  Marland Kitchen insulin aspart (NOVOLOG FLEXPEN) 100 UNIT/ML FlexPen Inject 22 units with a correction 1:50 > 150 three times daily before meals  . Insulin Glargine (LANTUS SOLOSTAR) 100 UNIT/ML Solostar Pen Inject 65 Units into the skin at bedtime.   Marland Kitchen labetalol (NORMODYNE) 200 MG tablet TAKE 1 TABLET BY MOUTH 2 (TWO) TIMES DAILY.  Marland Kitchen Liraglutide (VICTOZA) 18 MG/3ML SOPN Inject 1.8 mg into the skin daily.   Marland Kitchen lisinopril-hydrochlorothiazide (PRINZIDE,ZESTORETIC) 10-12.5 MG per tablet Take 1 tablet by mouth daily.  . metformin (FORTAMET) 500 MG (OSM) 24 hr tablet Take 1,000 mg by mouth 2 (two) times daily with a meal.   . Multiple Vitamin (MULTIVITAMIN) capsule Take 1 capsule by mouth 2 (two) times daily.   Marland Kitchen OVER THE COUNTER MEDICATION Take 1 packet by mouth 2 (two) times daily. Nutralite multi  Vitamins  . OVER THE COUNTER MEDICATION Take 1 tablet by mouth 2 (two) times daily. Glucose Health Cromium Piccolate  . rOPINIRole (REQUIP) 0.25 MG tablet Take 1 tablet (0.25 mg total) by mouth at bedtime.  . cyclobenzaprine (FLEXERIL) 10 MG tablet Take 1 tablet (10 mg total) by mouth 2 (two) times daily as needed for muscle spasms. (Patient not taking: Reported on 02/03/2015)  . hydrocortisone (ANUSOL-HC) 25 MG suppository Place 1 suppository (25 mg total) rectally 2 (two) times daily. (Patient not taking: Reported on 02/03/2015)  . hydrocortisone 2.5 % ointment Apply topically 2 (two) times daily. (Patient not taking: Reported on 02/03/2015)  . LORazepam (ATIVAN) 0.5 MG  tablet Take 1 tablet (0.5 mg total) by mouth 2 (two) times daily as needed for anxiety. (Patient not taking: Reported on 02/03/2015)  . nystatin (MYCOSTATIN) powder Apply topically 4 (four) times daily. (Patient not taking: Reported on 02/03/2015)  . Omega-3 1000 MG CAPS Take 3 g by mouth daily. Reported on 02/03/2015  . ondansetron (ZOFRAN) 8 MG tablet Take 8 mg by mouth every 8 (eight) hours as needed for nausea or vomiting. Reported on 02/03/2015  . oxyCODONE-acetaminophen (PERCOCET/ROXICET) 5-325 MG per tablet Take 2 tablets by mouth every 4 (four) hours as needed for severe pain. (Patient not taking: Reported on 12/14/2014)  . Probiotic Product (PROBIOTIC DAILY PO) Take 1 tablet by mouth daily. Reported on 02/03/2015  . promethazine (PHENERGAN) 25 MG tablet Take 12.5 mg by mouth every 6 (six) hours as needed for nausea or vomiting. Reported on 02/03/2015  . ranitidine (ZANTAC) 150 MG tablet Take 150 mg by mouth 2 (two) times daily. Reported on 02/03/2015  . [DISCONTINUED] diclofenac sodium (VOLTAREN) 1 % GEL Apply 4 g topically 4 (four) times daily. (Patient not taking: Reported  on 12/14/2014)  . [DISCONTINUED] predniSONE (DELTASONE) 1 MG tablet Take 7 mg by mouth daily.  . [DISCONTINUED] silver sulfADIAZINE (SILVADENE) 1 % cream Apply 1 application topically daily. Apply dime size thickness to affected area daily (Patient not taking: Reported on 12/14/2014)  . [DISCONTINUED] sulfamethoxazole-trimethoprim (BACTRIM DS,SEPTRA DS) 800-160 MG per tablet Take 1 tablet by mouth 2 (two) times daily. (Patient not taking: Reported on 12/14/2014)  . [DISCONTINUED] traMADol (ULTRAM) 50 MG tablet Take 1 tablet (50 mg total) by mouth every 6 (six) hours as needed. (Patient not taking: Reported on 12/14/2014)   No facility-administered encounter medications on file as of 02/03/2015.    Allergy:  Allergies  Allergen Reactions  . Antibacterial Hand Soap [Triclosan] Other (See Comments) and Rash    Dry, itchy patches on  skin    Social Hx:  Social History   Social History  . Marital Status: Married    Spouse Name: N/A  . Number of Children: N/A  . Years of Education: N/A   Occupational History  . Not on file.   Social History Main Topics  . Smoking status: Never Smoker   . Smokeless tobacco: Never Used  . Alcohol Use: 0.0 oz/week    0 Standard drinks or equivalent per week     Comment: rare  . Drug Use: No  . Sexual Activity: Yes    Birth Control/ Protection: IUD     Comment: mirena    Other Topics Concern  . Not on file   Social History Narrative    Past Surgical Hx:  Past Surgical History  Procedure Laterality Date  . Hysteroscopy w/d&c  01/24/2012    Procedure: DILATATION AND CURETTAGE /HYSTEROSCOPY;  Surgeon: Betsy Coder, MD;  Location: [facility];  Service: Gynecology;  Laterality: N/A;  with vulvar biopsies   . Intrauterine device (iud) insertion  01/24/2012    Procedure: INTRAUTERINE DEVICE (IUD) INSERTION;  Surgeon: Betsy Coder, MD;  Location: [facility];  Service: Gynecology;  Laterality: N/A;  . Dilation and curettage of uterus    . Robotic assisted total hysterectomy with bilateral salpingo oopherectomy Bilateral 07/02/2012    Procedure: ROBOTIC ASSISTED TOTAL HYSTERECTOMY WITH BILATERAL SALPINGO OOPHORECTOMY ;  Surgeon: Imagene Gurney A. Alycia Rossetti, MD;  Location[facility];  Service: Gynecology;  Laterality: Bilateral;  . Transsphenoid hypophysectomy  11/26/2013    ACTH secreting pituitary lesion causing Cushing's; [facility]; Angelene Giovanni, MD  . Abdominal hysterectomy      Past Medical Hx:  Past Medical History  Diagnosis Date  . Cellulitis of lower leg     x3 on right leg  . Hypertension   . Irregular menstrual bleeding   . PCOS (polycystic ovarian syndrome)   . Tachycardia   . Abnormal Pap smear 2000  . Endometrial ca ([place]) 12/20/2011  . Asthma     no issues in years  . Family history of anesthesia complication     sister had hard time waking up and problems breathing   . Cushing's syndrome ([place]) 09/25/2013    MRI done on 10/02/2013  . 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 Diabetes mellitus   . Anxiety   . Neuromuscular disorder ([place])     DIABETIC NEUROPATHY    Family Hx: She is one of 7 girls. Although 2 of her sisters have depression. She has one sister with mitochondrial myopathy. Her paternal grandfather died of lung cancer she was a smoker. Maternal grandmother 54  have had bone "cancer" Family History  Problem Relation Age of Onset  . Diabetes type II Mother   . Coronary artery disease Mother   . Diabetes Mother   . Heart disease Mother   . Hyperlipidemia Mother   . Hypertension Mother   . Diabetes type II Father   . Coronary artery disease Father   . Diabetes Father   . Heart disease Father   . Hyperlipidemia Father   . Heart disease Sister   . Diabetes Sister     Vitals:  Blood pressure 123/72, pulse 73, temperature 97.5 F (36.4 C), temperature source Oral, resp. rate 18, height 5\' 5"  (1.651 m), weight 248 lb 3.2 oz (112.583 kg), last menstrual period 01/17/2012, SpO2 99 %.  Physical Exam:  Well-nourished well-developed female in no acute distress.   Neck: Supple, no lymphadenopathy, no thyromegaly.  Lungs: Clear to auscultation bilaterally.  Cardiovascular: Regular rate and rhythm.   breasts: Small skin tag on right breast  At the 8:00 position. No other skin changes, no nipple discharge. No palpable masses. The left breast is without masses, skin changes or nipple discharge.  Abdomen: Morbidly obese. Well-healed surgical incisions. No appreciable hernias. + rash c/w candida under pannus, skin intact.  Groins: No lymphadenopathy. Exam limited by habitus..  Extremities: Chronic venous stasis changes in the right lower extremity.  Bilateral 1+ edema lower extremity.  Pelvic: Normal female genitalia. Vagina is well epithelialized. There is no discharge.  The vaginal cuff is difficult to visualize secondary to habitus. There are no gross visible lesions. Bimanual examination reveals no masses or nodularity. However, exam is limited by habitus.    Assessment/Plan: 39 year old gravida 0 with a grade Ia grade 1 endometrial carcinoma s/p definitive therapy 6/14. She has had no evidence of recurrent disease in 2.5 years.  She will return to see me in 6 months or when necessary and we will perform a pap smear at that time.  She has moved to [place]. She was provided the name of to GYN oncologist in the [place] region. As she has family here she is not sure if she'll transfer care to ask social continue coming here. We will be happy to see if she decides to continue care here in [place]. If she switches care we will send records.    Cailan Antonucci A., MD 02/03/2015, 1:28 PM

## 2015-02-03 NOTE — Patient Instructions (Addendum)
Return to clinic in 6 months unless  You decided to transfer care in [place]. I will recommend Dr. Caryl Pina case and Dr. Hermina Staggers.

## 2015-02-05 NOTE — Telephone Encounter (Signed)
Lorazepam rx called in to pharmacy

## 2015-02-05 NOTE — Telephone Encounter (Signed)
Please call in Lorazepam as approved.

## 2015-03-10 ENCOUNTER — Telehealth: Payer: BLUE CROSS/BLUE SHIELD | Admitting: Family

## 2015-03-10 DIAGNOSIS — J209 Acute bronchitis, unspecified: Secondary | ICD-10-CM

## 2015-03-10 MED ORDER — AZITHROMYCIN 250 MG PO TABS: ORAL_TABLET | ORAL | Status: DC

## 2015-03-10 MED ORDER — BENZONATATE 100 MG PO CAPS: 100.0000 mg | ORAL_CAPSULE | Freq: Two times a day (BID) | ORAL | Status: DC | PRN

## 2015-03-10 NOTE — Progress Notes (Signed)
We are sorry that you are not feeling well.  Here is how we plan to help!  Based on what you have shared with me it looks like you have upper respiratory tract inflammation that has resulted in a significant cough.  Inflammation and infection in the upper respiratory tract is commonly called bronchitis and has four common causes:  Allergies, Viral Infections, Acid Reflux and Bacterial Infections.  Allergies, viruses and acid reflux are treated by controlling symptoms or eliminating the cause. An example might be a cough caused by taking certain blood pressure medications. You stop the cough by changing the medication. Another example might be a cough caused by acid reflux. Controlling the reflux helps control the cough.  Based on your presentation I believe you most likely have A cough due to bacteria.  When patients have a fever and a productive cough with a change in color or increased sputum production, we are concerned about bacterial bronchitis.  If left untreated it can progress to pneumonia.  If your symptoms do not improve with your treatment plan it is important that you contact your provider.   I have prescribed Azithromyin 250 mg: two tables now and then one tablet daily for 4 additonal days   In addition you may use A non-prescription cough medication called Robitussin DAC. Take 2 teaspoons every 8 hours or Delsym: take 2 teaspoons every 12 hours. and A non-prescription cough medication called Mucinex DM: take 2 tablets every 12 hours.    HOME CARE . Only take medications as instructed by your medical team. . Complete the entire course of an antibiotic. . Drink plenty of fluids and get plenty of rest. . Avoid close contacts especially the very young and the elderly . Cover your mouth if you cough or cough into your sleeve. . Always remember to wash your hands . A steam or ultrasonic humidifier can help congestion.    GET HELP RIGHT AWAY IF: . You develop worsening fever. . You become  short of breath . You cough up blood. . Your symptoms persist after you have completed your treatment plan MAKE SURE YOU   Understand these instructions.  Will watch your condition.  Will get help right away if you are not doing well or get worse.  Your e-visit answers were reviewed by a board certified advanced clinical practitioner to complete your personal care plan.  Depending on the condition, your plan could have included both over the counter or prescription medications. If there is a problem please reply  once you have received a response from your provider. Your safety is important to [place].  If you have drug allergies check your prescription carefully.    You can use MyChart to ask questions about today's visit, request a non-urgent call back, or ask for a work or school excuse for 24 hours related to this e-Visit. If it has been greater than 24 hours you will need to follow up with your provider, or enter a new e-Visit to address those concerns. You will get an e-mail in the next two days asking about your experience.  I hope that your e-visit has been valuable and will speed your recovery. Thank you for using e-visits.

## 2015-03-19 ENCOUNTER — Ambulatory Visit: Payer: BLUE CROSS/BLUE SHIELD | Admitting: Family Medicine

## 2015-03-30 ENCOUNTER — Ambulatory Visit: Payer: BLUE CROSS/BLUE SHIELD | Admitting: Family Medicine

## 2015-07-17 ENCOUNTER — Other Ambulatory Visit: Payer: Self-pay | Admitting: Family Medicine

## 2015-07-23 ENCOUNTER — Other Ambulatory Visit: Payer: Self-pay

## 2015-07-23 MED ORDER — ATORVASTATIN CALCIUM 10 MG PO TABS
ORAL_TABLET | ORAL | Status: DC
Start: 2015-07-23 — End: 2017-07-23

## 2015-12-29 ENCOUNTER — Other Ambulatory Visit: Payer: Self-pay | Admitting: Family Medicine

## 2016-01-01 ENCOUNTER — Ambulatory Visit (INDEPENDENT_AMBULATORY_CARE_PROVIDER_SITE_OTHER): Payer: BLUE CROSS/BLUE SHIELD | Admitting: Family Medicine

## 2016-01-01 VITALS — BP 118/76 | HR 102 | Temp 98.1°F | Resp 17 | Ht 65.0 in | Wt 267.0 lb

## 2016-01-01 DIAGNOSIS — Z794 Long term (current) use of insulin: Secondary | ICD-10-CM | POA: Diagnosis not present

## 2016-01-01 DIAGNOSIS — F411 Generalized anxiety disorder: Secondary | ICD-10-CM | POA: Diagnosis not present

## 2016-01-01 DIAGNOSIS — L03115 Cellulitis of right lower limb: Secondary | ICD-10-CM | POA: Diagnosis not present

## 2016-01-01 DIAGNOSIS — E240 Pituitary-dependent Cushing's disease: Secondary | ICD-10-CM

## 2016-01-01 DIAGNOSIS — E1142 Type 2 diabetes mellitus with diabetic polyneuropathy: Secondary | ICD-10-CM | POA: Diagnosis not present

## 2016-01-01 MED ORDER — GABAPENTIN 300 MG PO CAPS: ORAL_CAPSULE | ORAL | 1 refills | Status: DC

## 2016-01-01 MED ORDER — CEPHALEXIN 500 MG PO CAPS: 500.0000 mg | ORAL_CAPSULE | Freq: Four times a day (QID) | ORAL | 0 refills | Status: DC

## 2016-01-01 MED ORDER — SULFAMETHOXAZOLE-TRIMETHOPRIM 800-160 MG PO TABS: 2.0000 | ORAL_TABLET | Freq: Two times a day (BID) | ORAL | 0 refills | Status: DC

## 2016-01-01 NOTE — Patient Instructions (Signed)
     IF you received an x-ray today, you will receive an invoice from [facility]. Please contact [facility] at [phone] with questions or concerns regarding your invoice.   IF you received labwork today, you will receive an invoice from [facility]. Please contact [facility] at [phone] with questions or concerns regarding your invoice.   Our billing staff will not be able to assist you with questions regarding bills from these companies.  You will be contacted with the lab results as soon as they are available. The fastest way to get your results is to activate your My Chart account. Instructions are located on the last page of this paperwork. If you have not heard from us regarding the results in 2 weeks, please contact this office.     

## 2016-01-01 NOTE — Progress Notes (Signed)
Subjective:    Patient ID: Kelli Allen, female    DOB: 06-02-1975, 40 y.o.   MRN: [id number]  01/01/2016  Leg Problem (Right. Flared up last week. )   HPI This 40 y.o. female presents for evaluation of leg pain/cellulitis.  Went with best friend yesterday to get nails done.  Best friend noticed redness along R anterior leg.  Having other stuff going on; taking Amoxicillin for sinusitis.  Highly allergic to bugs; getting bitten by ?spiders.  A couple of weeks ago, got bitten possibly on R anterior leg.  Having a lot of leg swelling B.  Collie Siad thought should get checked.  Works in [place]; works in [place].  Drives one hour to work every day.  Came to town to visit with friends and family and spending Christmas with mother.  Took off yesterday; got hair done.  No fever/chills/sweats.  Last night ankle was burning; sprayed antiseptic spray on it.  Has known something has been evolving.  Has been watching toe closely because toe turned firey red.  Had cellulitis late October; treated with Keflex with success.  One man show.   Ran out of Gabapentin this week; out of Gabapentin. Did not miss any work with sinusitis which is very unusual.   Prednisone withdrawal pain resolved 08/2014.  Duration March to September 2016.    Fatigue is improving.     Review of Systems  Constitutional: Positive for fatigue. Negative for chills, diaphoresis and fever.  HENT: Positive for congestion.   Eyes: Negative for visual disturbance.  Respiratory: Negative for cough and shortness of breath.   Cardiovascular: Negative for chest pain, palpitations and leg swelling.  Gastrointestinal: Negative for abdominal pain, constipation, diarrhea, nausea and vomiting.  Endocrine: Negative for cold intolerance, heat intolerance, polydipsia, polyphagia and polyuria.  Musculoskeletal: Positive for myalgias.  Skin: Positive for color change.  Neurological: Positive for numbness. Negative for dizziness, tremors, seizures,  syncope, facial asymmetry, speech difficulty, weakness, light-headedness and headaches.    Past Medical History:  Diagnosis Date  . Abnormal Pap smear 2000  . Anxiety   . Asthma    no issues in years  . Cellulitis of lower leg    x3 on right leg  . 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 Cushing's syndrome ([place]) 09/25/2013   MRI done on 10/02/2013  . Diabetes mellitus   . Endometrial ca ([place]) 12/20/2011  . Family history of anesthesia complication    sister had hard time waking up and problems breathing  . Hypertension   . Irregular menstrual bleeding   . Neuromuscular disorder ([place])    DIABETIC NEUROPATHY  . PCOS (polycystic ovarian syndrome)   . Tachycardia    Past Surgical History:  Procedure Laterality Date  . ABDOMINAL HYSTERECTOMY    . DILATION AND CURETTAGE OF UTERUS    . HYSTEROSCOPY W/D&C  01/24/2012   Procedure: DILATATION AND CURETTAGE /HYSTEROSCOPY;  Surgeon: Betsy Coder, MD;  Location: [facility];  Service: Gynecology;  Laterality: N/A;  with vulvar biopsies   . INTRAUTERINE DEVICE (IUD) INSERTION  01/24/2012   Procedure: INTRAUTERINE DEVICE (IUD) INSERTION;  Surgeon: Betsy Coder, MD;  Location: [facility];  Service: Gynecology;  Laterality: N/A;  . ROBOTIC ASSISTED TOTAL HYSTERECTOMY WITH BILATERAL SALPINGO OOPHERECTOMY Bilateral 07/02/2012   Procedure: ROBOTIC ASSISTED TOTAL HYSTERECTOMY WITH BILATERAL SALPINGO OOPHORECTOMY ;  Surgeon: Imagene Gurney A. Alycia Rossetti, MD;  Location: [facility];  Service: Gynecology;  Laterality: Bilateral;  .  transsphenoid hypophysectomy  11/26/2013   ACTH secreting pituitary lesion causing Cushing's; [facility]; Angelene Giovanni, MD   Allergies  Allergen Reactions  . Antibacterial Hand Soap [Triclosan] Other (See Comments) and Rash    Dry, itchy patches on skin    Social History   Social History  . Marital status: Married    Spouse name: N/A  . Number of children: N/A  .  Years of education: N/A   Occupational History  . Not on file.   Social History Main Topics  . Smoking status: Never Smoker  . Smokeless tobacco: Never Used  . Alcohol use 0.0 oz/week     Comment: rare  . Drug use: No  . Sexual activity: Yes    Birth control/ protection: IUD     Comment: mirena    Other Topics Concern  . Not on file   Social History Narrative  . No narrative on file   Family History  Problem Relation Age of Onset  . Diabetes type II Mother   . Coronary artery disease Mother   . Diabetes Mother   . Heart disease Mother   . Hyperlipidemia Mother   . Hypertension Mother   . Diabetes type II Father   . Coronary artery disease Father   . Diabetes Father   . Heart disease Father   . Hyperlipidemia Father   . Heart disease Sister   . Diabetes Sister        Objective:    BP 118/76 (BP Location: Right Arm, Patient Position: Sitting, Cuff Size: Large)   Pulse (!) 102   Temp 98.1 F (36.7 C) (Oral)   Resp 17   Ht 5\' 5"  (1.651 m)   Wt 267 lb (121.1 kg)   LMP 01/17/2012   SpO2 97%   BMI 44.43 kg/m  Physical Exam  Constitutional: She is oriented to person, place, and time. She appears well-developed and well-nourished. No distress.  HENT:  Head: Normocephalic and atraumatic.  Right Ear: External ear normal.  Left Ear: External ear normal.  Nose: Nose normal.  Mouth/Throat: Oropharynx is clear and moist.  Eyes: Conjunctivae and EOM are normal. Pupils are equal, round, and reactive to light.  Neck: Normal range of motion. Neck supple. Carotid bruit is not present. No thyromegaly present.  Cardiovascular: Normal rate, regular rhythm, normal heart sounds and intact distal pulses.  Exam reveals no gallop and no friction rub.   No murmur heard. Pulmonary/Chest: Effort normal and breath sounds normal. She has no wheezes. She has no rales.  Lymphadenopathy:    She has no cervical adenopathy.  Neurological: She is alert and oriented to person, place, and  time. No cranial nerve deficit.  Skin: Skin is warm and dry. No rash noted. She is not diaphoretic. There is erythema. No pallor.  Very mild erythema R anterior shin along lateral distal region with mild warmth; no streaking or fluctuance.  Psychiatric: She has a normal mood and affect. Her behavior is normal.   Fall Risk  12/14/2014 08/10/2014 04/01/2014 03/11/2014 02/11/2014  Falls in the past year? No No No No No  Number falls in past yr: - - - - -  Injury with Fall? - - - - -   Depression screen [facility] 2/9 12/14/2014 08/10/2014 08/07/2014 07/07/2014 05/14/2014  Decreased Interest 1 0 0 0 0  Down, Depressed, Hopeless 1 0 0 0 0  PHQ - 2 Score 2 0 0 0 0  Altered sleeping 3 - - - -  Tired,  decreased energy 2 - - - -  Change in appetite 2 - - - -  Feeling bad or failure about yourself  1 - - - -  Trouble concentrating 0 - - - -  Moving slowly or fidgety/restless 0 - - - -  Suicidal thoughts 0 - - - -  PHQ-9 Score 10 - - - -        Assessment & Plan:   1. Cellulitis of leg, right   2. Type 2 diabetes mellitus with diabetic polyneuropathy, with long-term current use of insulin ([place])   3. Cushing's disease ([place])   4. Generalized anxiety disorder    -New. -very mild symptoms at this time. -monitor closely over the upcoming 72 hours; start abx therapy if symptoms worsen. -monitor sugars closely with evolving infection -emotionally doing well on Prozac therapy.   No orders of the defined types were placed in this encounter.  Meds ordered this encounter  Medications  . DISCONTD: cephALEXin (KEFLEX) 500 MG capsule    Sig: Take 1 capsule (500 mg total) by mouth 4 (four) times daily.    Dispense:  28 capsule    Refill:  0  . sulfamethoxazole-trimethoprim (BACTRIM DS,SEPTRA DS) 800-160 MG tablet    Sig: Take 2 tablets by mouth 2 (two) times daily.    Dispense:  40 tablet    Refill:  0  . cephALEXin (KEFLEX) 500 MG capsule    Sig: Take 1 capsule (500 mg total) by mouth 4 (four) times daily.     Dispense:  28 capsule    Refill:  0  . gabapentin (NEURONTIN) 300 MG capsule    Sig: TAKE 1-2 CAPSULES (300-600 MG TOTAL) BY MOUTH 3 (THREE) TIMES DAILY.    Dispense:  540 capsule    Refill:  1    No Follow-up on file.   Kristi Elayne Guerin, M.D. [facility] [address] [place], [place]  [address] [phone] phone [phone] fax

## 2016-01-15 ENCOUNTER — Encounter: Payer: Self-pay | Admitting: Family Medicine

## 2016-07-25 IMAGING — US US EXTREM LOW VENOUS*R*
1 series · 13 of 23 positions shown · non-contrast
Comparison: 05/08/2013.

CLINICAL DATA: Red hot area on the anterior and medial aspect of
the right calf. Evaluate for deep venous thrombosis.



[Series 1: us extrem low venous*right* · 0.12mm/px · 21 acquisitions, 13 frames shown]
[im 1/21]
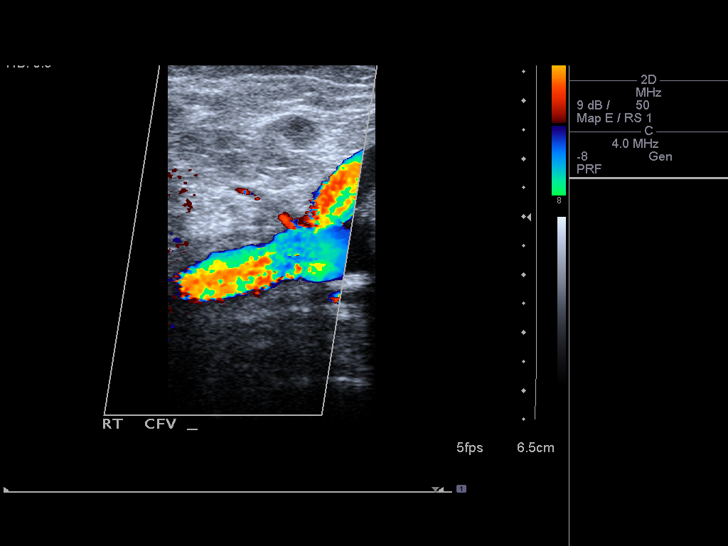
[im 3/21]
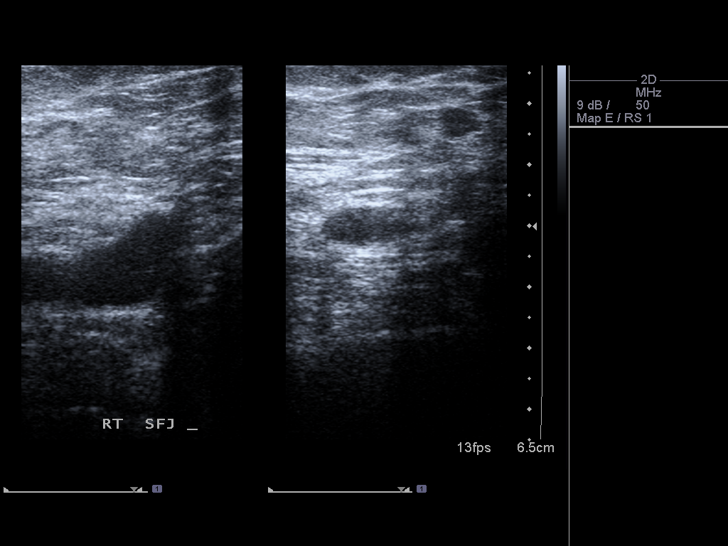
[im 5/21]
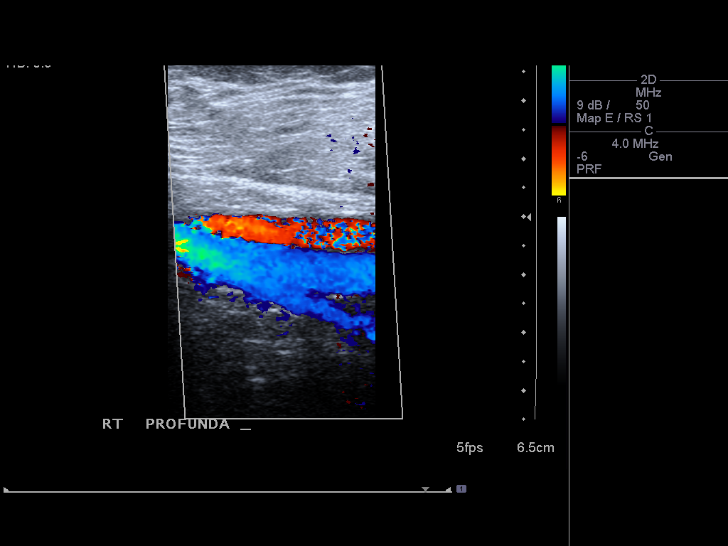
[im 7/21]
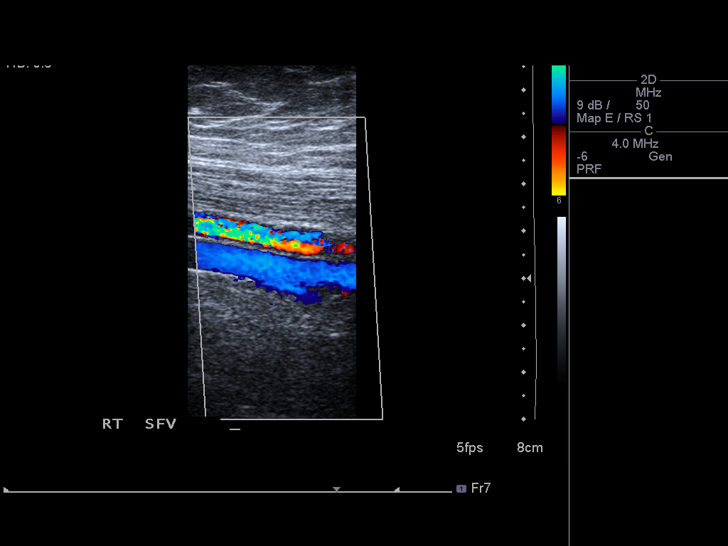
[im 8/21]
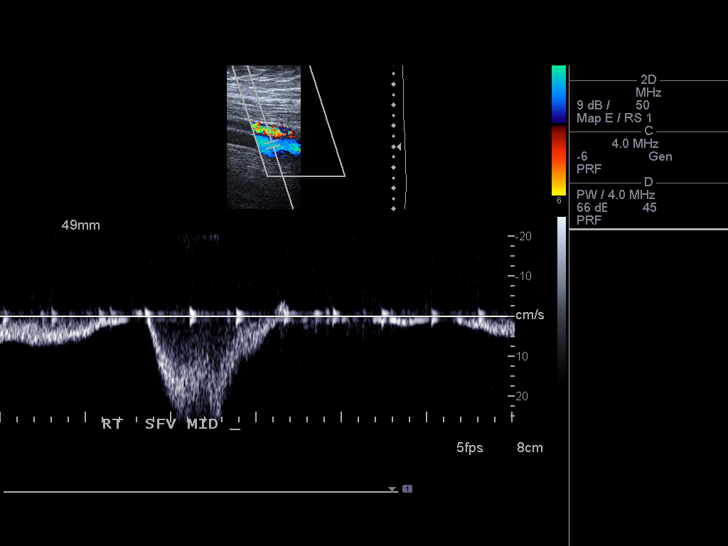
[im 10/21]
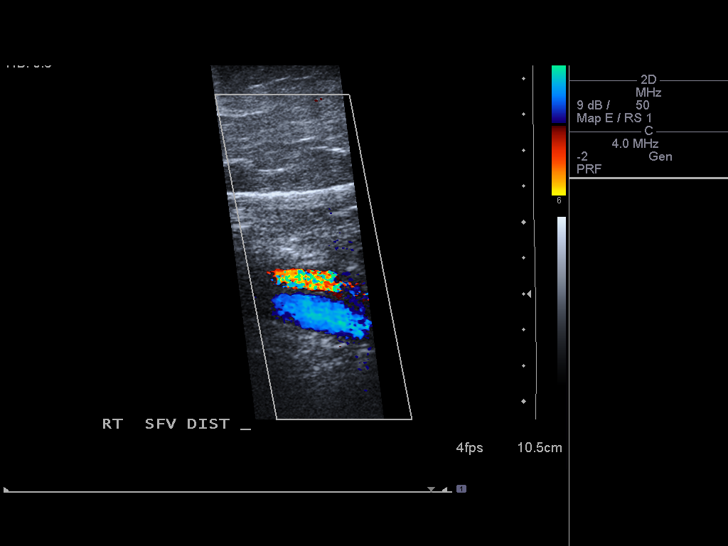
[im 12/21]
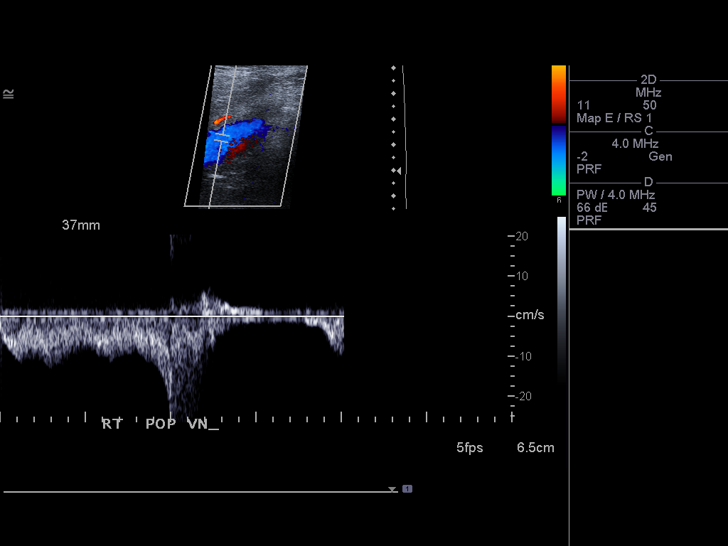
[im 14/21]
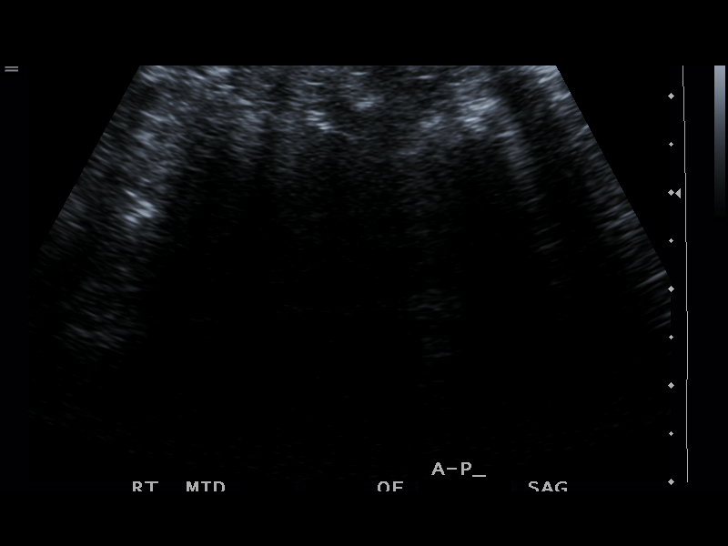
[im 15/21]
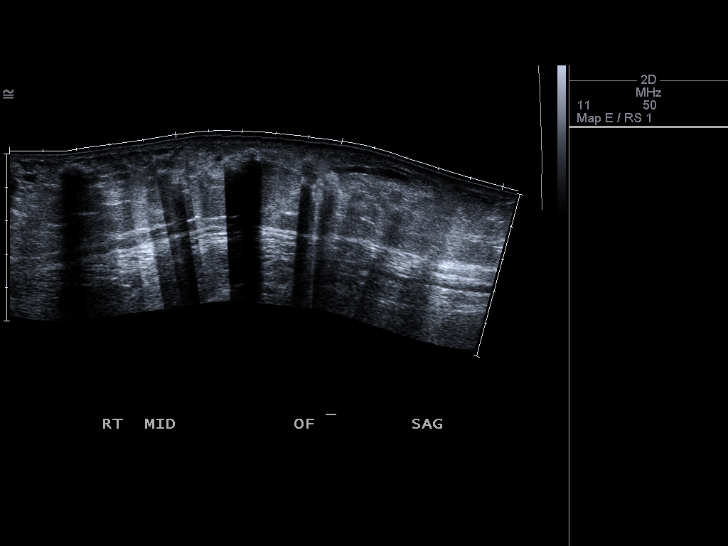
[im 16/21]
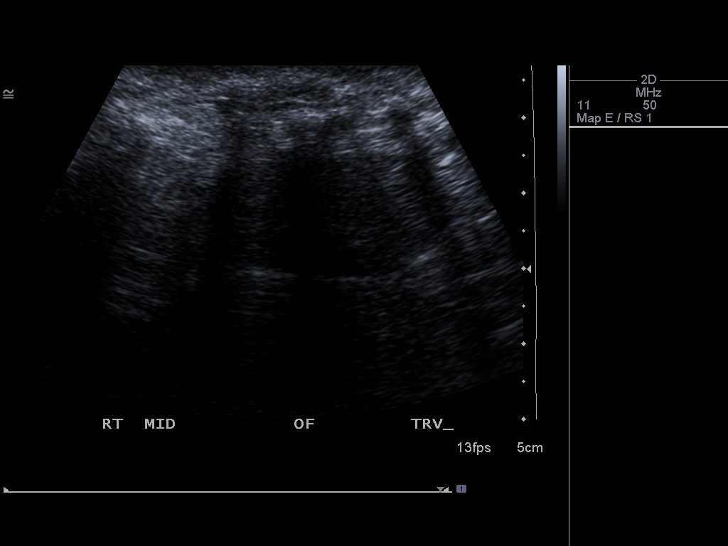
[im 17/21]
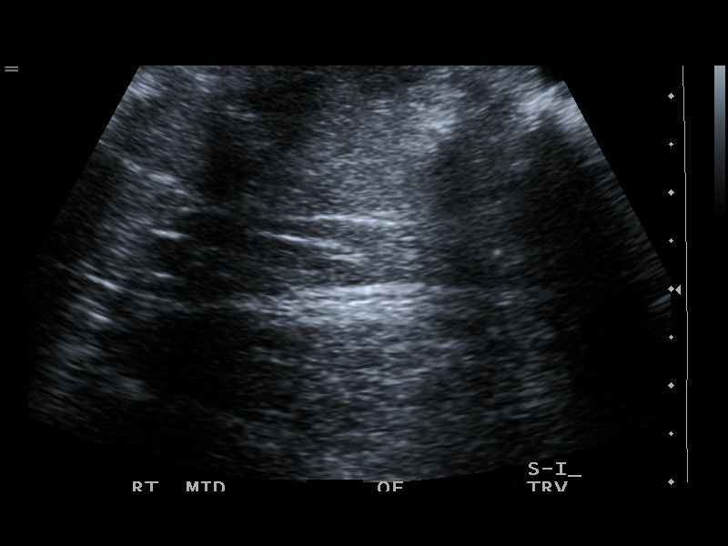
[im 19/21]
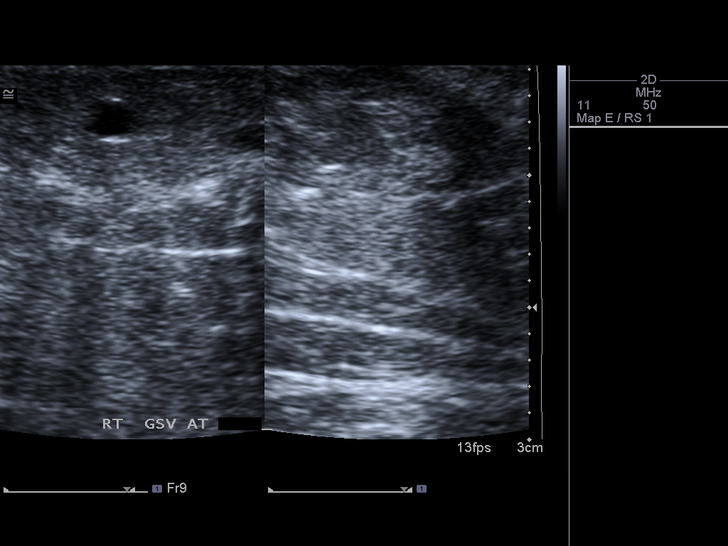
[im 21/21]
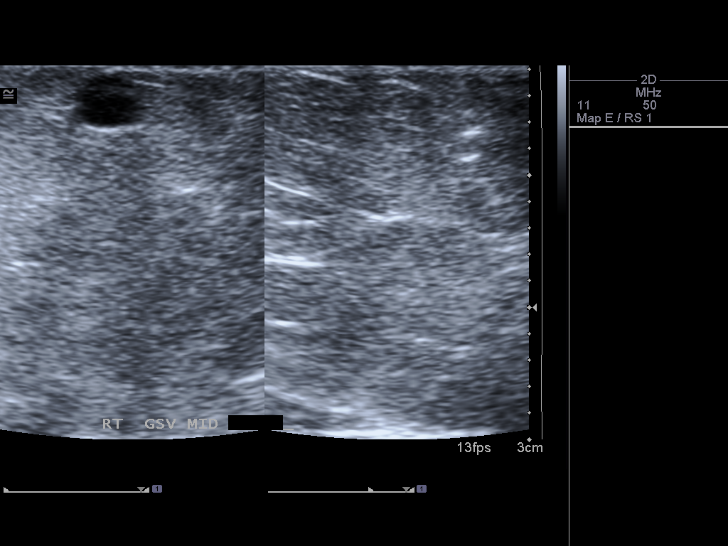

[13 of 23 positions shown; findings below may reference images not displayed]

FINDINGS: Common Femoral Vein: No evidence of thrombus. Normal
compressibility, respiratory phasicity and response to augmentation.

Saphenofemoral Junction: No evidence of thrombus. Normal
compressibility and flow on color Doppler imaging.

Profunda Femoral Vein: No evidence of thrombus. Normal
compressibility and flow on color Doppler imaging.

Femoral Vein: No evidence of thrombus. Normal compressibility,
respiratory phasicity and response to augmentation.

Popliteal Vein: No evidence of thrombus. Normal compressibility,
respiratory phasicity and response to augmentation.

Calf Veins: No evidence of thrombus. Normal compressibility and flow
on color Doppler imaging.

Superficial Great Saphenous Vein: No evidence of thrombus. Normal
compressibility and flow on color Doppler imaging.

Venous Reflux:  None.

Other Findings:  None.
IMPRESSION: No evidence of right lower extremity deep venous thrombosis.

## 2016-07-27 IMAGING — CR DG CHEST 2V
2 series · 2 of 2 positions shown · non-contrast
Comparison: 06/28/2012

CLINICAL DATA: Chest pressure and hypertension.

EXAM:
CHEST  2 VIEW

[w chest pa]
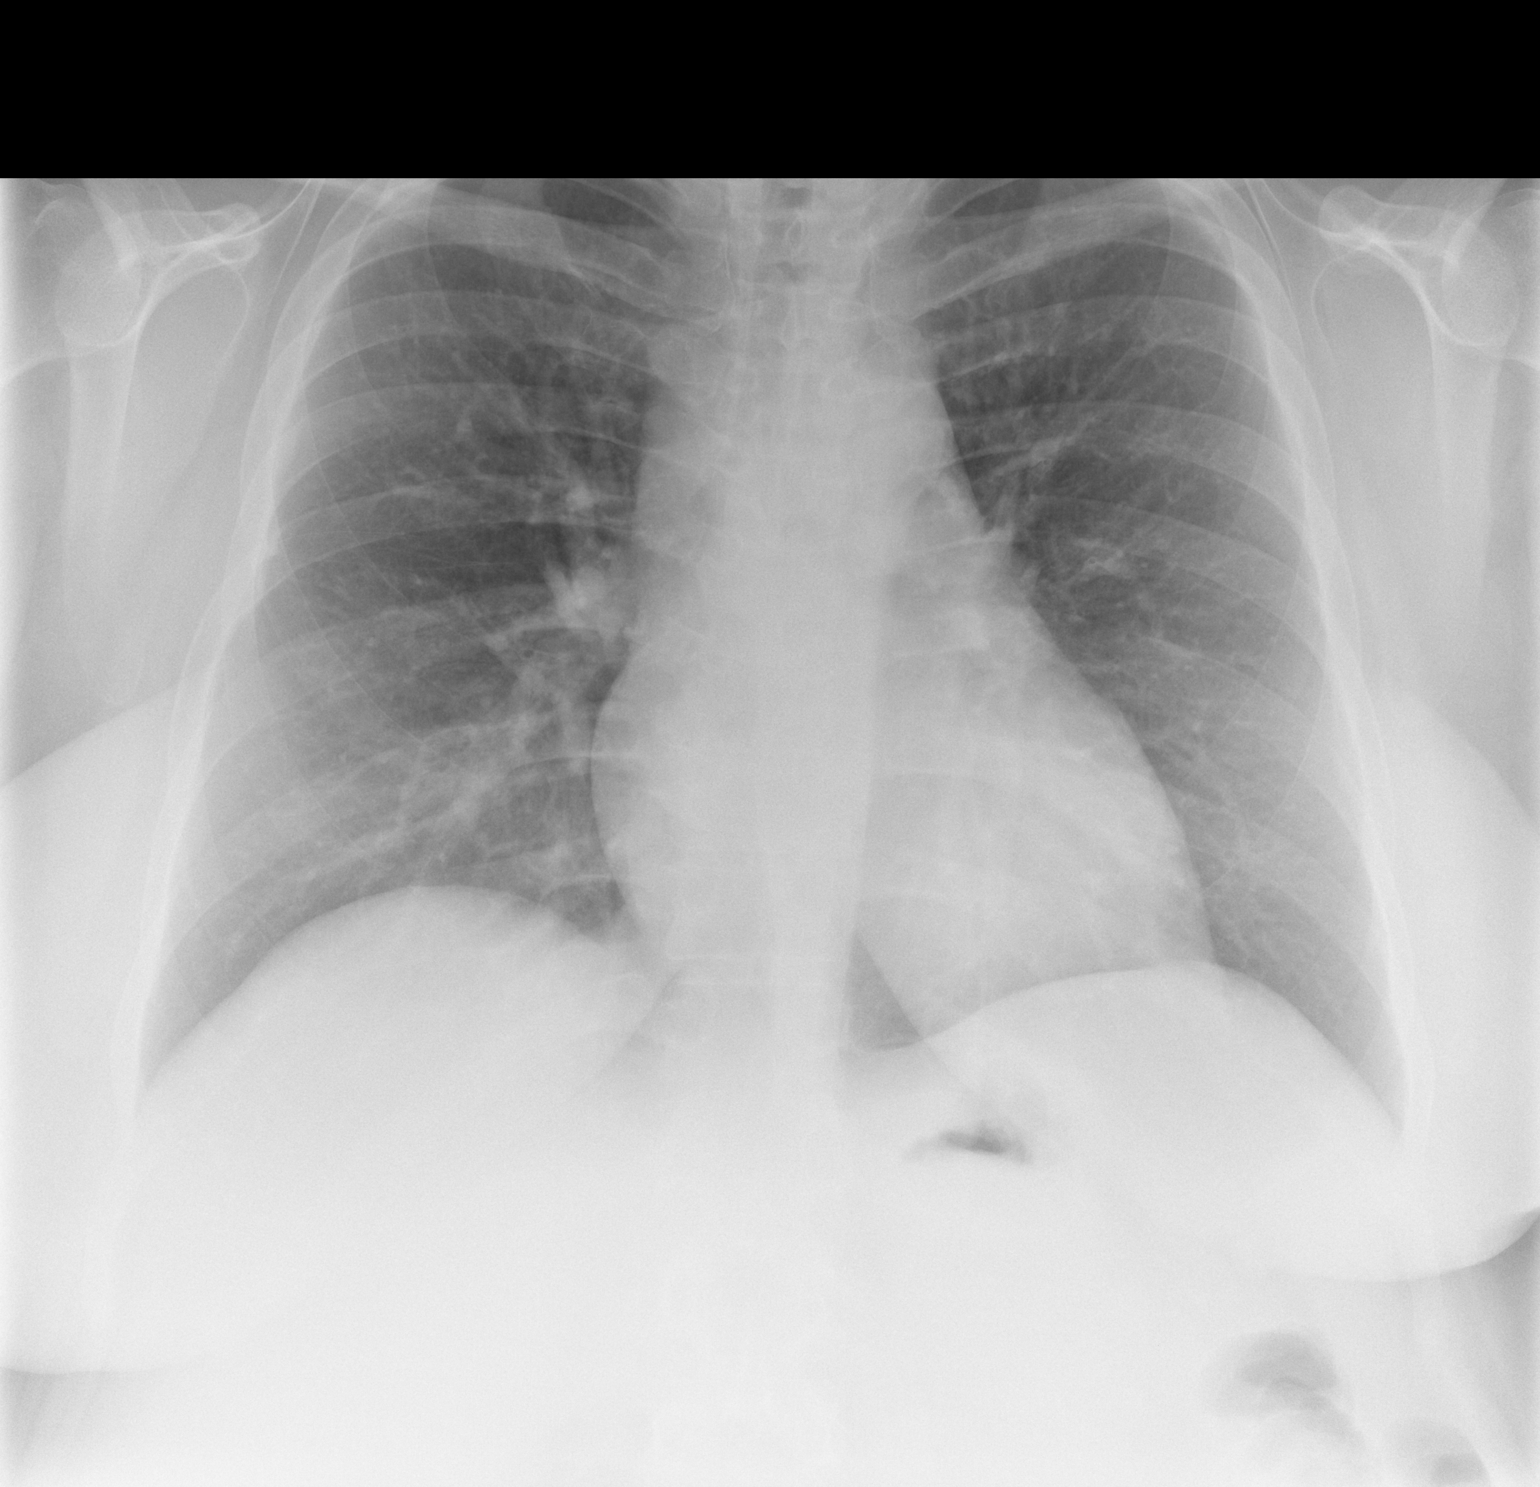

[w chest lat *]
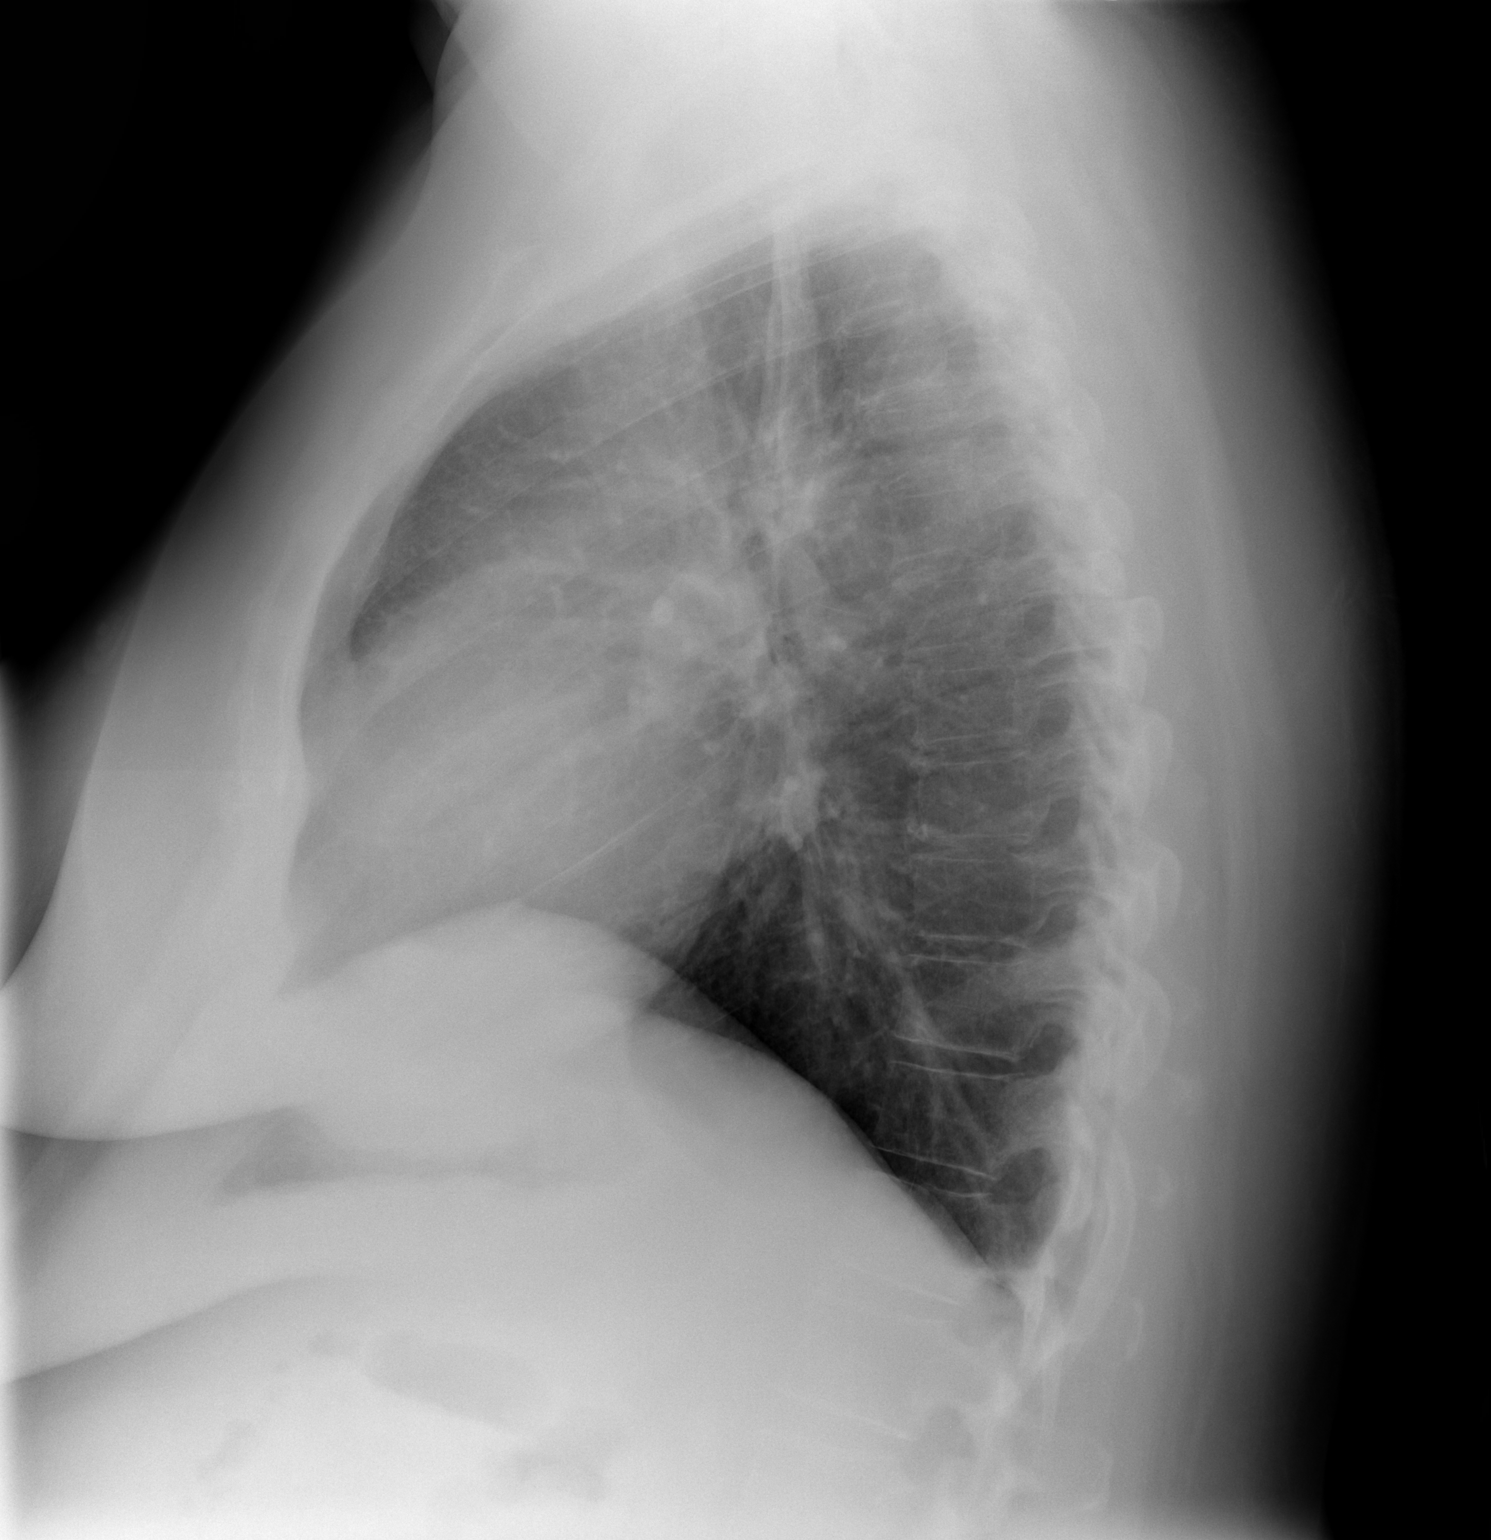

[2 of 2 positions shown; findings below may reference images not displayed]

FINDINGS: Midline trachea. Borderline cardiomegaly. Mediastinal contours
otherwise within normal limits. No pleural effusion or pneumothorax.
Clear lungs.
IMPRESSION: Borderline cardiomegaly, without acute disease.

## 2017-02-02 ENCOUNTER — Ambulatory Visit: Payer: BLUE CROSS/BLUE SHIELD | Admitting: Physician Assistant

## 2017-02-02 VITALS — BP 124/78 | HR 88 | Temp 98.2°F | Resp 16 | Ht 65.0 in | Wt 283.0 lb

## 2017-02-02 DIAGNOSIS — J310 Chronic rhinitis: Secondary | ICD-10-CM

## 2017-02-02 DIAGNOSIS — J329 Chronic sinusitis, unspecified: Secondary | ICD-10-CM | POA: Diagnosis not present

## 2017-02-02 MED ORDER — BENZONATATE 100 MG PO CAPS: 100.0000 mg | ORAL_CAPSULE | Freq: Three times a day (TID) | ORAL | 0 refills | Status: DC | PRN

## 2017-02-02 MED ORDER — IPRATROPIUM BROMIDE 0.03 % NA SOLN: 2.0000 | Freq: Two times a day (BID) | NASAL | 0 refills | Status: AC

## 2017-02-02 NOTE — Patient Instructions (Addendum)
I would like you to continue to hydrate. Please continue the antibiotic. Please take tylenol or ibuprofen for pain or fever.   Please use a nasal saline spray.  This can be done 30 minutes prior to using the ipratropium nasal spray. You can also use a humidifier in your main room as well.   Sinusitis, Adult Sinusitis is soreness and inflammation of your sinuses. Sinuses are hollow spaces in the bones around your face. They are located:  Around your eyes.  In the middle of your forehead.  Behind your nose.  In your cheekbones.  Your sinuses and nasal passages are lined with a stringy fluid (mucus). Mucus normally drains out of your sinuses. When your nasal tissues get inflamed or swollen, the mucus can get trapped or blocked so air cannot flow through your sinuses. This lets bacteria, viruses, and funguses grow, and that leads to infection. Follow these instructions at home: Medicines  Take, use, or apply over-the-counter and prescription medicines only as told by your doctor. These may include nasal sprays.  If you were prescribed an antibiotic medicine, take it as told by your doctor. Do not stop taking the antibiotic even if you start to feel better. Hydrate and Humidify  Drink enough water to keep your pee (urine) clear or pale yellow.  Use a cool mist humidifier to keep the humidity level in your home above 50%.  Breathe in steam for 10-15 minutes, 3-4 times a day or as told by your doctor. You can do this in the bathroom while a hot shower is running.  Try not to spend time in cool or dry air. Rest  Rest as much as possible.  Sleep with your head raised (elevated).  Make sure to get enough sleep each night. General instructions  Put a warm, moist washcloth on your face 3-4 times a day or as told by your doctor. This will help with discomfort.  Wash your hands often with soap and water. If there is no soap and water, use hand sanitizer.  Do not smoke. Avoid being  around people who are smoking (secondhand smoke).  Keep all follow-up visits as told by your doctor. This is important. Contact a doctor if:  You have a fever.  Your symptoms get worse.  Your symptoms do not get better within 10 days. Get help right away if:  You have a very bad headache.  You cannot stop throwing up (vomiting).  You have pain or swelling around your face or eyes.  You have trouble seeing.  You feel confused.  Your neck is stiff.  You have trouble breathing. This information is not intended to replace advice given to you by your health care provider. Make sure you discuss any questions you have with your health care provider. Document Released: 06/14/2007 Document Revised: 08/22/2015 Document Reviewed: 10/21/2014 Elsevier Interactive Patient Education  2018 [employer].     IF you received an x-ray today, you will receive an invoice from [facility]. Please contact [facility] at [phone] with questions or concerns regarding your invoice.   IF you received labwork today, you will receive an invoice from [facility]. Please contact [facility] at [phone] with questions or concerns regarding your invoice.   Our billing staff will not be able to assist you with questions regarding bills from these companies.  You will be contacted with the lab results as soon as they are available. The fastest way to get your results is to activate your My Chart account. Instructions  are located on the last page of this paperwork. If you have not heard from [place] regarding the results in 2 weeks, please contact this office.

## 2017-02-02 NOTE — Progress Notes (Signed)
PRIMARY CARE AT [place], [place] [address] [phone]  Date:  02/02/2017   Name:  Kelli Allen   DOB:  1975/01/12   MRN:  [id number]  PCP:  Wardell Honour, MD    History of Present Illness:  Kelli Allen is a 42 y.o. female patient who presents to PCP with  Chief Complaint  Patient presents with  . Sinusitis    x 2 months/ pt states she has been feeling bad since christmas.  . Cough    pt has been on cefdinir for 3 days.     3 months ago, with intermittent ear pain, nasal congestion, runny nose. She would take liquid tylenol cold and flu which remedies, but then it would resolve.   She has congestion, runny nose and sore throat 4 weeks ago.  She took tylenol syrup.  She took corticetin, and 2 weeks later.   She has facial pain of right cheek radiating to the eye.  has green thick mucus, hardened, and colored.  She is coughing non-productive.   She has been taking 4 rounds of OTC medications.   Patient was given cefdinir by her physician in [place].  She is taking for 3 days, but is seeking evaluation as this was given over the phone    Patient Active Problem List   Diagnosis Date Noted  . Osteopenia 04/24/2014  . Cellulitis of right lower extremity   . Hyponatremia   . Type 2 diabetes mellitus with diabetic polyneuropathy ([place]) 01/13/2014  . Sinus tachycardia by electrocardiogram 01/01/2014  . Disease of pituitary gland ([place]) 11/25/2013  . Cushing's disease ([place]) 10/15/2013  . Endometrial cancer ([place]) 07/03/2012  . Elevated cholesterol with high triglycerides 03/02/2011  . HTN (hypertension) 02/26/2011  . POLYCYSTIC OVARY 03/08/2006  . Obesity 03/08/2006  . Asthma 03/08/2006  . Deficiency, disaccharidase intestinal 03/08/2006    Past Medical History:  Diagnosis Date  . Abnormal Pap smear 2000  . Anxiety   . Asthma    no issues in years  . Cellulitis of lower leg    x3 on right leg  . Cushing syndrome ([place]) 11/26/2013   s/p  transsphenoidal hypophysectomy for ACTH secreting pituitary lesion  . Cushing's syndrome ([place]) 09/25/2013   MRI done on 10/02/2013  . Diabetes mellitus   . Endometrial ca ([place]) 12/20/2011  . Family history of anesthesia complication    sister had hard time waking up and problems breathing  . Hypertension   . Irregular menstrual bleeding   . Neuromuscular disorder ([place])    DIABETIC NEUROPATHY  . PCOS (polycystic ovarian syndrome)   . Tachycardia     Past Surgical History:  Procedure Laterality Date  . ABDOMINAL HYSTERECTOMY    . DILATION AND CURETTAGE OF UTERUS    . HYSTEROSCOPY W/D&C  01/24/2012   Procedure: DILATATION AND CURETTAGE /HYSTEROSCOPY;  Surgeon: Betsy Coder, MD;  Location: [facility];  Service: Gynecology;  Laterality: N/A;  with vulvar biopsies   . INTRAUTERINE DEVICE (IUD) INSERTION  01/24/2012   Procedure: INTRAUTERINE DEVICE (IUD) INSERTION;  Surgeon: Betsy Coder, MD;  Location: [facility];  Service: Gynecology;  Laterality: N/A;  . ROBOTIC ASSISTED TOTAL HYSTERECTOMY WITH BILATERAL SALPINGO OOPHERECTOMY Bilateral 07/02/2012   Procedure: ROBOTIC ASSISTED TOTAL HYSTERECTOMY WITH BILATERAL SALPINGO OOPHORECTOMY ;  Surgeon: Imagene Gurney A. Alycia Rossetti, MD;  Location: [facility];  Service: Gynecology;  Laterality: Bilateral;  . transsphenoid hypophysectomy  11/26/2013   ACTH secreting pituitary lesion causing Cushing's; [facility]; Angelene Giovanni, MD  Social History   Tobacco Use  . Smoking status: Never Smoker  . Smokeless tobacco: Never Used  Substance Use Topics  . Alcohol use: Yes    Alcohol/week: 0.0 oz    Comment: rare  . Drug use: No    Family History  Problem Relation Age of Onset  . Diabetes type II Mother   . Coronary artery disease Mother   . Diabetes Mother   . Heart disease Mother   . Hyperlipidemia Mother   . Hypertension Mother   . Diabetes type II Father   . Coronary artery disease Father   . Diabetes Father   . Heart disease Father   . Hyperlipidemia  Father   . Heart disease Sister   . Diabetes Sister     Allergies  Allergen Reactions  . Antibacterial Hand Soap [Triclosan] Other (See Comments) and Rash    Dry, itchy patches on skin    Medication list has been reviewed and updated.  Current Outpatient Medications on File Prior to Visit  Medication Sig Dispense Refill  . albuterol (PROAIR HFA) 108 (90 BASE) MCG/ACT inhaler Inhale 2 puffs into the lungs every 6 (six) hours as needed for shortness of breath. 18 g 1  . albuterol (PROVENTIL) (2.5 MG/3ML) 0.083% nebulizer solution Take 2.5 mg by nebulization every 6 (six) hours as needed for wheezing or shortness of breath.    Marland Kitchen atorvastatin (LIPITOR) 10 MG tablet TAKE ONE TABLET BY MOUTH ONE TIME DAILY AT 6 PM. 30 tablet 0  . BAYER CONTOUR TEST test strip   98  . cefdinir (OMNICEF) 300 MG capsule Take 300 mg by mouth 2 (two) times daily.    . cephALEXin (KEFLEX) 500 MG capsule Take 1 capsule (500 mg total) by mouth 4 (four) times daily. 28 capsule 0  . Ergocalciferol (VITAMIN D2) 2000 UNITS TABS Take 1 tablet by mouth daily.     Marland Kitchen FLUoxetine (PROZAC) 20 MG tablet Take 3 tablets (60 mg total) by mouth daily. 90 tablet 5  . gabapentin (NEURONTIN) 300 MG capsule TAKE 1-2 CAPSULES (300-600 MG TOTAL) BY MOUTH 3 (THREE) TIMES DAILY. 540 capsule 1  . insulin aspart (NOVOLOG FLEXPEN) 100 UNIT/ML FlexPen Inject 22 units with a correction 1:50 > 150 three times daily before meals    . Insulin Glargine (LANTUS SOLOSTAR) 100 UNIT/ML Solostar Pen Inject 65 Units into the skin at bedtime.     Marland Kitchen labetalol (NORMODYNE) 200 MG tablet TAKE 1 TABLET BY MOUTH 2 (TWO) TIMES DAILY. 180 tablet 1  . Liraglutide (VICTOZA) 18 MG/3ML SOPN Inject 1.8 mg into the skin daily.     Marland Kitchen lisinopril-hydrochlorothiazide (PRINZIDE,ZESTORETIC) 10-12.5 MG per tablet Take 1 tablet by mouth daily. 90 tablet 3  . LORazepam (ATIVAN) 0.5 MG tablet TAKE ONE TABLET BY MOUTH TWICE DAILY AS NEEDED FOR ANXIETY 45 tablet 0  . metformin  (FORTAMET) 500 MG (OSM) 24 hr tablet Take 1,000 mg by mouth 2 (two) times daily with a meal.     . Multiple Vitamin (MULTIVITAMIN) capsule Take 1 capsule by mouth 2 (two) times daily.     . Omega-3 1000 MG CAPS Take 3 g by mouth daily. Reported on 02/03/2015    . ondansetron (ZOFRAN) 8 MG tablet Take 8 mg by mouth every 8 (eight) hours as needed for nausea or vomiting. Reported on 02/03/2015    . OVER THE COUNTER MEDICATION Take 1 packet by mouth 2 (two) times daily. Nutralite multi  Vitamins    . OVER THE  COUNTER MEDICATION Take 1 tablet by mouth 2 (two) times daily. Glucose Health Cromium Piccolate    . oxyCODONE-acetaminophen (PERCOCET/ROXICET) 5-325 MG per tablet Take 2 tablets by mouth every 4 (four) hours as needed for severe pain. 15 tablet 0  . Probiotic Product (PROBIOTIC DAILY PO) Take 1 tablet by mouth daily. Reported on 02/03/2015    . ranitidine (ZANTAC) 150 MG tablet Take 150 mg by mouth 2 (two) times daily. Reported on 02/03/2015    . rOPINIRole (REQUIP) 0.25 MG tablet TAKE 1 TABLET (0.25 MG TOTAL) BY MOUTH AT BEDTIME. 30 tablet 0  . azithromycin (ZITHROMAX Z-PAK) 250 MG tablet As directed (Patient not taking: Reported on 02/02/2017) 1 each 0  . benzonatate (TESSALON) 100 MG capsule Take 1 capsule (100 mg total) by mouth 2 (two) times daily as needed for cough. (Patient not taking: Reported on 02/02/2017) 20 capsule 0  . cyclobenzaprine (FLEXERIL) 10 MG tablet Take 1 tablet (10 mg total) by mouth 2 (two) times daily as needed for muscle spasms. (Patient not taking: Reported on 01/01/2016) 20 tablet 0  . promethazine (PHENERGAN) 25 MG tablet Take 12.5 mg by mouth every 6 (six) hours as needed for nausea or vomiting. Reported on 02/03/2015    . sulfamethoxazole-trimethoprim (BACTRIM DS,SEPTRA DS) 800-160 MG tablet Take 2 tablets by mouth 2 (two) times daily. (Patient not taking: Reported on 02/02/2017) 40 tablet 0   No current facility-administered medications on file prior to visit.      ROS ROS otherwise unremarkable unless listed above.  Physical Examination: BP 124/78   Pulse 88   Temp 98.2 F (36.8 C) (Oral)   Resp 16   Ht 5\' 5"  (1.651 m)   Wt 283 lb (128.4 kg)   LMP 01/17/2012   SpO2 95%   BMI 47.09 kg/m  Ideal Body Weight: Weight in (lb) to have BMI = 25: 149.9  Physical Exam  Constitutional: She is oriented to person, place, and time. She appears well-developed and well-nourished. No distress.  HENT:  Head: Normocephalic and atraumatic.  Right Ear: Tympanic membrane, external ear and ear canal normal.  Left Ear: Tympanic membrane, external ear and ear canal normal.  Nose: Mucosal edema and rhinorrhea present. Right sinus exhibits no maxillary sinus tenderness and no frontal sinus tenderness. Left sinus exhibits no maxillary sinus tenderness and no frontal sinus tenderness.  Mouth/Throat: No uvula swelling. No oropharyngeal exudate, posterior oropharyngeal edema or posterior oropharyngeal erythema.  Eyes: Conjunctivae and EOM are normal. Pupils are equal, round, and reactive to light.  Cardiovascular: Normal rate and regular rhythm. Exam reveals no gallop, no distant heart sounds and no friction rub.  No murmur heard. Pulmonary/Chest: Effort normal. No respiratory distress. She has no decreased breath sounds. She has no wheezes. She has no rhonchi.  Lymphadenopathy:       Head (right side): No submandibular, no tonsillar, no preauricular and no posterior auricular adenopathy present.       Head (left side): No submandibular, no tonsillar, no preauricular and no posterior auricular adenopathy present.  Neurological: She is alert and oriented to person, place, and time.  Skin: She is not diaphoretic.  Psychiatric: She has a normal mood and affect. Her behavior is normal.     Assessment and Plan: Arlethia Basso is a 42 y.o. female who is here today for cc of  Chief Complaint  Patient presents with  . Sinusitis    x 2 months/ pt states she has been  feeling bad since christmas.  Marland Kitchen  Cough    pt has been on cefdinir for 3 days.  --advised to continue the cefdinir to treat her sinusitis. --given supportive additions for nasal symptoms and cough relief --Rtc as needed.  She will follow up with PCP. Rhinosinusitis - Plan: ipratropium (ATROVENT) 0.03 % nasal spray, benzonatate (TESSALON) 100 MG capsule  Ivar Drape, PA-C [facility] 2/8/20198:01 AM

## 2017-02-16 ENCOUNTER — Encounter: Payer: Self-pay | Admitting: Physician Assistant

## 2017-03-21 ENCOUNTER — Ambulatory Visit: Payer: BLUE CROSS/BLUE SHIELD | Admitting: Family Medicine

## 2017-04-09 ENCOUNTER — Encounter: Payer: Self-pay | Admitting: Family Medicine

## 2017-04-09 ENCOUNTER — Other Ambulatory Visit: Payer: Self-pay

## 2017-04-09 ENCOUNTER — Ambulatory Visit: Payer: BLUE CROSS/BLUE SHIELD | Admitting: Family Medicine

## 2017-04-09 VITALS — BP 112/70 | HR 95 | Temp 98.0°F | Resp 16 | Ht 65.16 in | Wt 283.0 lb

## 2017-04-09 DIAGNOSIS — E782 Mixed hyperlipidemia: Secondary | ICD-10-CM

## 2017-04-09 DIAGNOSIS — F411 Generalized anxiety disorder: Secondary | ICD-10-CM

## 2017-04-09 DIAGNOSIS — C541 Malignant neoplasm of endometrium: Secondary | ICD-10-CM | POA: Diagnosis not present

## 2017-04-09 DIAGNOSIS — E1142 Type 2 diabetes mellitus with diabetic polyneuropathy: Secondary | ICD-10-CM

## 2017-04-09 DIAGNOSIS — M533 Sacrococcygeal disorders, not elsewhere classified: Secondary | ICD-10-CM | POA: Diagnosis not present

## 2017-04-09 DIAGNOSIS — Z794 Long term (current) use of insulin: Secondary | ICD-10-CM | POA: Diagnosis not present

## 2017-04-09 DIAGNOSIS — J4520 Mild intermittent asthma, uncomplicated: Secondary | ICD-10-CM | POA: Diagnosis not present

## 2017-04-09 DIAGNOSIS — Z9989 Dependence on other enabling machines and devices: Secondary | ICD-10-CM

## 2017-04-09 DIAGNOSIS — E240 Pituitary-dependent Cushing's disease: Secondary | ICD-10-CM | POA: Diagnosis not present

## 2017-04-09 DIAGNOSIS — I10 Essential (primary) hypertension: Secondary | ICD-10-CM | POA: Diagnosis not present

## 2017-04-09 DIAGNOSIS — G4733 Obstructive sleep apnea (adult) (pediatric): Secondary | ICD-10-CM

## 2017-04-09 NOTE — Patient Instructions (Signed)
     IF you received an x-ray today, you will receive an invoice from [facility]. Please contact [facility] at [phone] with questions or concerns regarding your invoice.   IF you received labwork today, you will receive an invoice from [facility]. Please contact [facility] at [phone] with questions or concerns regarding your invoice.   Our billing staff will not be able to assist you with questions regarding bills from these companies.  You will be contacted with the lab results as soon as they are available. The fastest way to get your results is to activate your My Chart account. Instructions are located on the last page of this paperwork. If you have not heard from us regarding the results in 2 weeks, please contact this office.     

## 2017-04-09 NOTE — Progress Notes (Signed)
Subjective:    Patient ID: Kelli Allen, female    DOB: [birth year], 42 y.o.   MRN: [id number]  04/09/2017  Re-Establish Care    HPI This 42 y.o. female presents for one year follow-up appointment.  Still working in [place]; husband working in [place].   Driving one hour to work.   [place].  36 miles but takes one hour.  Did get a new car. Doing well overall. Has gained weight.   Has gained weight back.    Tailbone pain: recurrent pain again.  Hurting some especially if prolonged sitting.  After surgery, everything was hurting; went away for a long time. Not daily.   Not sure if due to weight gain or if there is arthritis of tailbone.  HgbA1c of 8.90 which is higher; was a 6.9.  Had to take Prednisone due to rash with Solumedrol shot.  Then weight started recurring.   Then talked to [place] who recommended Prednisone therapy.   PCP has not had time to see patient so will call in medications.  Cannot get in to be seen.   HTN: Patient reports good compliance with medication, good tolerance to medication, and good symptom control.  Needs refill of Lisinopril.  [place] [facility] does full panel in October 2018; has follow-up this month.   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  Grandfather lived with patient for four months. Grandfather with BPH. Also has large hernia grandfather.  GF needed surgery in [place].  Parents getting ready to move.  Four siblings in the area.  In town today nd tomorrow and will be in [place] for conference.  Then has babyshower for niece in [place].  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    [employer]; now down 248 lb.    Moms mobility has greatly improved.  Parents have always acted 37 years older than was.  Parents don't move.  No one willing to step up to the plate.    Has underbite.  Has OSA with CPAP.  Struggles with it and takes  it off middle of night.  Humidified air with CPAP.  Has been contacted.  Doing braces as well.    Horse race when visiting [place]; work trip.      BP Readings from Last 3 Encounters:  04/09/17 112/70  02/02/17 124/78  01/01/16 118/76   Wt Readings from Last 3 Encounters:  04/09/17 283 lb (128.4 kg)  02/02/17 283 lb (128.4 kg)  01/01/16 267 lb (121.1 kg)   Immunization History  Administered Date(s) Administered  . Influenza,inj,Quad PF,6+ Mos 01/08/2014, 10/15/2015, 10/26/2016  . Influenza-Unspecified 10/10/2014, 08/10/2015, 10/15/2015, 10/28/2016  . Pneumococcal Polysaccharide-23 01/08/2014  . Td 05/14/2004    Review of Systems  Constitutional: Negative for chills, diaphoresis, fatigue and fever.  Eyes: Negative for visual disturbance.  Respiratory: Negative for cough and shortness of breath.   Cardiovascular: Positive for leg swelling. Negative for chest pain and palpitations.  Gastrointestinal: Negative for abdominal pain, constipation, diarrhea, nausea and vomiting.  Endocrine: Negative for cold intolerance, heat intolerance, polydipsia, polyphagia and polyuria.  Musculoskeletal: Positive for arthralgias and myalgias.  Neurological: Negative for dizziness, tremors, seizures, syncope, facial asymmetry, speech difficulty, weakness, light-headedness, numbness and headaches.  Psychiatric/Behavioral: Negative for self-injury, sleep disturbance and suicidal ideas. The patient is nervous/anxious.     Past Medical History:  Diagnosis Date  . Abnormal Pap smear 2000  .  Anxiety   . Asthma    no issues in years  . Cellulitis of lower leg    x3 on right leg  . Cushing syndrome ([place]) 11/26/2013   s/p transsphenoidal hypophysectomy for ACTH secreting pituitary lesion  . Cushing's syndrome ([place]) 09/25/2013   MRI done on 10/02/2013  . Diabetes mellitus   . Endometrial ca ([place]) 12/20/2011  . Family history of anesthesia complication    sister had hard time waking up and problems  breathing  . Hypertension   . Irregular menstrual bleeding   . Neuromuscular disorder ([place])    DIABETIC NEUROPATHY  . PCOS (polycystic ovarian syndrome)   . Tachycardia    Past Surgical History:  Procedure Laterality Date  . ABDOMINAL HYSTERECTOMY    . DILATION AND CURETTAGE OF UTERUS    . HYSTEROSCOPY W/D&C  01/24/2012   Procedure: DILATATION AND CURETTAGE /HYSTEROSCOPY;  Surgeon: Betsy Coder, MD;  Location: [facility];  Service: Gynecology;  Laterality: N/A;  with vulvar biopsies   . INTRAUTERINE DEVICE (IUD) INSERTION  01/24/2012   Procedure: INTRAUTERINE DEVICE (IUD) INSERTION;  Surgeon: Betsy Coder, MD;  Location: [facility];  Service: Gynecology;  Laterality: N/A;  . ROBOTIC ASSISTED TOTAL HYSTERECTOMY WITH BILATERAL SALPINGO OOPHERECTOMY Bilateral 07/02/2012   Procedure: ROBOTIC ASSISTED TOTAL HYSTERECTOMY WITH BILATERAL SALPINGO OOPHORECTOMY ;  Surgeon: Imagene Gurney A. Alycia Rossetti, MD;  Location: [facility];  Service: Gynecology;  Laterality: Bilateral;  . transsphenoid hypophysectomy  11/26/2013   ACTH secreting pituitary lesion causing Cushing's; [facility]; Angelene Giovanni, MD   Allergies  Allergen Reactions  . Antibacterial Hand Soap [Triclosan] Other (See Comments) and Rash    Dry, itchy patches on skin   Current Outpatient Medications on File Prior to Visit  Medication Sig Dispense Refill  . albuterol (PROAIR HFA) 108 (90 BASE) MCG/ACT inhaler Inhale 2 puffs into the lungs every 6 (six) hours as needed for shortness of breath. 18 g 1  . atorvastatin (LIPITOR) 10 MG tablet TAKE ONE TABLET BY MOUTH ONE TIME DAILY AT 6 PM. 30 tablet 0  . BAYER CONTOUR TEST test strip   98  . Ergocalciferol (VITAMIN D2) 2000 UNITS TABS Take 1 tablet by mouth daily.     Marland Kitchen FLUoxetine (PROZAC) 20 MG tablet Take 3 tablets (60 mg total) by mouth daily. 90 tablet 5  . gabapentin (NEURONTIN) 300 MG capsule TAKE 1-2 CAPSULES (300-600 MG TOTAL) BY MOUTH 3 (THREE) TIMES DAILY. 540 capsule 1  . insulin aspart (NOVOLOG  FLEXPEN) 100 UNIT/ML FlexPen Inject 22 units with a correction 1:50 > 150 three times daily before meals    . ipratropium (ATROVENT) 0.03 % nasal spray Place 2 sprays into both nostrils 2 (two) times daily. 30 mL 0  . labetalol (NORMODYNE) 200 MG tablet TAKE 1 TABLET BY MOUTH 2 (TWO) TIMES DAILY. 180 tablet 1  . Liraglutide (VICTOZA) 18 MG/3ML SOPN Inject 1.8 mg into the skin daily.     Marland Kitchen lisinopril-hydrochlorothiazide (PRINZIDE,ZESTORETIC) 10-12.5 MG per tablet Take 1 tablet by mouth daily. 90 tablet 3  . metformin (FORTAMET) 500 MG (OSM) 24 hr tablet Take 1,000 mg by mouth 2 (two) times daily with a meal.     . Multiple Vitamin (MULTIVITAMIN) capsule Take 1 capsule by mouth 2 (two) times daily.     . Omega-3 1000 MG CAPS Take 3 g by mouth daily. Reported on 02/03/2015    . OVER THE COUNTER MEDICATION Take 1 packet by mouth 2 (two) times daily. Nutralite multi  Vitamins    .  OVER THE COUNTER MEDICATION Take 1 tablet by mouth 2 (two) times daily. Glucose Health Cromium Piccolate    . Probiotic Product (PROBIOTIC DAILY PO) Take 1 tablet by mouth daily. Reported on 02/03/2015    . promethazine (PHENERGAN) 25 MG tablet Take 12.5 mg by mouth every 6 (six) hours as needed for nausea or vomiting. Reported on 02/03/2015    . rOPINIRole (REQUIP) 0.25 MG tablet TAKE 1 TABLET (0.25 MG TOTAL) BY MOUTH AT BEDTIME. 30 tablet 0  . Semaglutide 0.25 or 0.5 MG/DOSE SOPN Inject into the skin.     No current facility-administered medications on file prior to visit.    Social History   Socioeconomic History  . Marital status: Married    Spouse name: Not on file  . Number of children: Not on file  . Years of education: Not on file  . Highest education level: Not on file  Occupational History  . Not on file  Social Needs  . Financial resource strain: Not on file  . Food insecurity:    Worry: Not on file    Inability: Not on file  . Transportation needs:    Medical: Not on file    Non-medical: Not on file    Tobacco Use  . Smoking status: Never Smoker  . Smokeless tobacco: Never Used  Substance and Sexual Activity  . Alcohol use: Yes    Alcohol/week: 0.0 oz    Comment: rare  . Drug use: No  . Sexual activity: Yes    Birth control/protection: IUD    Comment: mirena   Lifestyle  . Physical activity:    Days per week: Not on file    Minutes per session: Not on file  . Stress: Not on file  Relationships  . Social connections:    Talks on phone: Not on file    Gets together: Not on file    Attends religious service: Not on file    Active member of club or organization: Not on file    Attends meetings of clubs or organizations: Not on file    Relationship status: Not on file  . Intimate partner violence:    Fear of current or ex partner: Not on file    Emotionally abused: Not on file    Physically abused: Not on file    Forced sexual activity: Not on file  Other Topics Concern  . Not on file  Social History Narrative  . Not on file   Family History  Problem Relation Age of Onset  . Heart disease Sister   . Diabetes type II Mother   . Coronary artery disease Mother   . Diabetes Mother   . Heart disease Mother   . Hyperlipidemia Mother   . Hypertension Mother   . Diabetes type II Father   . Coronary artery disease Father   . Diabetes Father   . Heart disease Father   . Hyperlipidemia Father   . Diabetes Sister        Objective:    BP 112/70   Pulse 95   Temp 98 F (36.7 C) (Oral)   Resp 16   Ht 5' 5.16" (1.655 m)   Wt 283 lb (128.4 kg)   LMP 01/17/2012   SpO2 96%   BMI 46.87 kg/m  Physical Exam  Constitutional: She is oriented to person, place, and time. She appears well-developed and well-nourished. No distress.  HENT:  Head: Normocephalic and atraumatic.  Right Ear: External ear normal.  Left  Ear: External ear normal.  Nose: Nose normal.  Mouth/Throat: Oropharynx is clear and moist.  Eyes: Pupils are equal, round, and reactive to light. Conjunctivae  and EOM are normal.  Neck: Normal range of motion. Neck supple. Carotid bruit is not present. No thyromegaly present.  Cardiovascular: Normal rate, regular rhythm, normal heart sounds and intact distal pulses. Exam reveals no gallop and no friction rub.  No murmur heard. 2+ pitting edema to B ankles; no associated erythema.  Pulmonary/Chest: Effort normal and breath sounds normal. She has no wheezes. She has no rales.  Abdominal: Soft. Bowel sounds are normal. She exhibits no distension and no mass. There is no tenderness. There is no rebound and no guarding.  Musculoskeletal: She exhibits edema.       Lumbar back: She exhibits bony tenderness and pain. She exhibits normal range of motion, no spasm and normal pulse.  Lymphadenopathy:    She has no cervical adenopathy.  Neurological: She is alert and oriented to person, place, and time. No cranial nerve deficit.  Skin: Skin is warm and dry. No rash noted. She is not diaphoretic. No erythema. No pallor.  Psychiatric: She has a normal mood and affect. Her behavior is normal. Judgment and thought content normal.   No results found. Depression screen [facility] 2/9 04/09/2017 02/02/2017 02/02/2017 12/14/2014 08/10/2014  Decreased Interest 0 0 0 1 0  Down, Depressed, Hopeless 0 0 0 1 0  PHQ - 2 Score 0 0 0 2 0  Altered sleeping - - - 3 -  Tired, decreased energy - - - 2 -  Change in appetite - - - 2 -  Feeling bad or failure about yourself  - - - 1 -  Trouble concentrating - - - 0 -  Moving slowly or fidgety/restless - - - 0 -  Suicidal thoughts - - - 0 -  PHQ-9 Score - - - 10 -   Fall Risk  04/09/2017 02/02/2017 02/02/2017 12/14/2014 08/10/2014  Falls in the past year? No No No No No  Number falls in past yr: - - - - -  Injury with [place] [place]:   1. Essential hypertension   2. Cushing's disease ([place])   3. Generalized anxiety disorder   4. Coccyx pain   5. Mild intermittent asthma without complication    6. Type 2 diabetes mellitus with diabetic polyneuropathy, with long-term current use of insulin ([place])   7. Endometrial cancer ([place])   8. Elevated cholesterol with high triglycerides   9. OSA on CPAP     Hypertension, hypercholesterolemia, generalized anxiety disorder, Cushing's disease, obstructive sleep apnea: Well-controlled on current regimens.  Followed by endocrinology who will obtain labs.  Maintained on current medications as listed.  Coccyx pain: Recurrent pain without trauma.  Offered imaging yet will not change treatment plan this patient declined.  Recommend supportive care with donut cushion and daily stretches.  Morbid obesity: Recent weight gain with worsening hemoglobin A1c and diabetic control.  Recommend weight loss, exercise, low calorie low-fat food choices.  No orders of the defined types were placed in this encounter.  No orders of the defined types were placed in this encounter.   Return in about 3 months (around 07/09/2017) for complete physical examiniation.   Averlee Swartz Elayne Guerin, M.D. Primary Care at [facility] [place], [place]  [address] [phone] phone [phone] fax

## 2017-04-09 NOTE — Progress Notes (Deleted)
Subjective:    Patient ID: Kelli Allen, female    DOB: 1975/02/03, 42 y.o.   MRN: [id number]  04/09/2017  Re-Establish Care    HPI This 42 y.o. female presents for evaluation of ***. BP Readings from Last 3 Encounters:  04/09/17 112/70  02/02/17 124/78  01/01/16 118/76   Wt Readings from Last 3 Encounters:  04/09/17 283 lb (128.4 kg)  02/02/17 283 lb (128.4 kg)  01/01/16 267 lb (121.1 kg)   Immunization History  Administered Date(s) Administered  . Influenza,inj,Quad PF,6+ Mos 01/08/2014  . Influenza-Unspecified 10/10/2014  . Pneumococcal Polysaccharide-23 01/08/2014  . Td 05/14/2004    Review of Systems  Past Medical History:  Diagnosis Date  . Abnormal Pap smear 2000  . Anxiety   . Asthma    no issues in years  . Cellulitis of lower leg    x3 on right leg  . 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 Cushing's syndrome ([place]) 09/25/2013   MRI done on 10/02/2013  . Diabetes mellitus   . Endometrial ca ([place]) 12/20/2011  . Family history of anesthesia complication    sister had hard time waking up and problems breathing  . Hypertension   . Irregular menstrual bleeding   . Neuromuscular disorder ([place])    DIABETIC NEUROPATHY  . PCOS (polycystic ovarian syndrome)   . Tachycardia    Past Surgical History:  Procedure Laterality Date  . ABDOMINAL HYSTERECTOMY    . DILATION AND CURETTAGE OF UTERUS    . HYSTEROSCOPY W/D&C  01/24/2012   Procedure: DILATATION AND CURETTAGE /HYSTEROSCOPY;  Surgeon: Betsy Coder, MD;  Location: [facility];  Service: Gynecology;  Laterality: N/A;  with vulvar biopsies   . INTRAUTERINE DEVICE (IUD) INSERTION  01/24/2012   Procedure: INTRAUTERINE DEVICE (IUD) INSERTION;  Surgeon: Betsy Coder, MD;  Location: [facility];  Service: Gynecology;  Laterality: N/A;  . ROBOTIC ASSISTED TOTAL HYSTERECTOMY WITH BILATERAL SALPINGO OOPHERECTOMY Bilateral 07/02/2012   Procedure:  ROBOTIC ASSISTED TOTAL HYSTERECTOMY WITH BILATERAL SALPINGO OOPHORECTOMY ;  Surgeon: Imagene Gurney A. Alycia Rossetti, MD;  Location: [facility];  Service: Gynecology;  Laterality: Bilateral;  . transsphenoid hypophysectomy  11/26/2013   ACTH secreting pituitary lesion causing Cushing's; [facility]; Angelene Giovanni, MD   Allergies  Allergen Reactions  . Antibacterial Hand Soap [Triclosan] Other (See Comments) and Rash    Dry, itchy patches on skin   Current Outpatient Medications on File Prior to Visit  Medication Sig Dispense Refill  . albuterol (PROAIR HFA) 108 (90 BASE) MCG/ACT inhaler Inhale 2 puffs into the lungs every 6 (six) hours as needed for shortness of breath. 18 g 1  . albuterol (PROVENTIL) (2.5 MG/3ML) 0.083% nebulizer solution Take 2.5 mg by nebulization every 6 (six) hours as needed for wheezing or shortness of breath.    Marland Kitchen atorvastatin (LIPITOR) 10 MG tablet TAKE ONE TABLET BY MOUTH ONE TIME DAILY AT 6 PM. 30 tablet 0  . azithromycin (ZITHROMAX Z-PAK) 250 MG tablet As directed 1 each 0  . BAYER CONTOUR TEST test strip   98  . benzonatate (TESSALON) 100 MG capsule Take 1-2 capsules (100-200 mg total) by mouth 3 (three) times daily as needed for cough. 40 capsule 0  . cefdinir (OMNICEF) 300 MG capsule Take 300 mg by mouth 2 (two) times daily.    . cephALEXin (KEFLEX) 500 MG capsule Take 1 capsule (500 mg total) by mouth 4 (four) times daily. 28 capsule 0  .  cyclobenzaprine (FLEXERIL) 10 MG tablet Take 1 tablet (10 mg total) by mouth 2 (two) times daily as needed for muscle spasms. 20 tablet 0  . Ergocalciferol (VITAMIN D2) 2000 UNITS TABS Take 1 tablet by mouth daily.     Marland Kitchen FLUoxetine (PROZAC) 20 MG tablet Take 3 tablets (60 mg total) by mouth daily. 90 tablet 5  . gabapentin (NEURONTIN) 300 MG capsule TAKE 1-2 CAPSULES (300-600 MG TOTAL) BY MOUTH 3 (THREE) TIMES DAILY. 540 capsule 1  . insulin aspart (NOVOLOG FLEXPEN) 100 UNIT/ML FlexPen Inject 22 units with a correction 1:50 > 150 three times  daily before meals    . ipratropium (ATROVENT) 0.03 % nasal spray Place 2 sprays into both nostrils 2 (two) times daily. 30 mL 0  . labetalol (NORMODYNE) 200 MG tablet TAKE 1 TABLET BY MOUTH 2 (TWO) TIMES DAILY. 180 tablet 1  . Liraglutide (VICTOZA) 18 MG/3ML SOPN Inject 1.8 mg into the skin daily.     Marland Kitchen lisinopril-hydrochlorothiazide (PRINZIDE,ZESTORETIC) 10-12.5 MG per tablet Take 1 tablet by mouth daily. 90 tablet 3  . LORazepam (ATIVAN) 0.5 MG tablet TAKE ONE TABLET BY MOUTH TWICE DAILY AS NEEDED FOR ANXIETY 45 tablet 0  . metformin (FORTAMET) 500 MG (OSM) 24 hr tablet Take 1,000 mg by mouth 2 (two) times daily with a meal.     . Multiple Vitamin (MULTIVITAMIN) capsule Take 1 capsule by mouth 2 (two) times daily.     . Omega-3 1000 MG CAPS Take 3 g by mouth daily. Reported on 02/03/2015    . ondansetron (ZOFRAN) 8 MG tablet Take 8 mg by mouth every 8 (eight) hours as needed for nausea or vomiting. Reported on 02/03/2015    . OVER THE COUNTER MEDICATION Take 1 packet by mouth 2 (two) times daily. Nutralite multi  Vitamins    . OVER THE COUNTER MEDICATION Take 1 tablet by mouth 2 (two) times daily. Glucose Health Cromium Piccolate    . oxyCODONE-acetaminophen (PERCOCET/ROXICET) 5-325 MG per tablet Take 2 tablets by mouth every 4 (four) hours as needed for severe pain. 15 tablet 0  . Probiotic Product (PROBIOTIC DAILY PO) Take 1 tablet by mouth daily. Reported on 02/03/2015    . promethazine (PHENERGAN) 25 MG tablet Take 12.5 mg by mouth every 6 (six) hours as needed for nausea or vomiting. Reported on 02/03/2015    . ranitidine (ZANTAC) 150 MG tablet Take 150 mg by mouth 2 (two) times daily. Reported on 02/03/2015    . rOPINIRole (REQUIP) 0.25 MG tablet TAKE 1 TABLET (0.25 MG TOTAL) BY MOUTH AT BEDTIME. 30 tablet 0  . Semaglutide 0.25 or 0.5 MG/DOSE SOPN Inject into the skin.    Marland Kitchen sulfamethoxazole-trimethoprim (BACTRIM DS,SEPTRA DS) 800-160 MG tablet Take 2 tablets by mouth 2 (two) times daily. 40  tablet 0   No current facility-administered medications on file prior to visit.    Social History   Socioeconomic History  . Marital status: Married    Spouse name: Not on file  . Number of children: Not on file  . Years of education: Not on file  . Highest education level: Not on file  Occupational History  . Not on file  Social Needs  . Financial resource strain: Not on file  . Food insecurity:    Worry: Not on file    Inability: Not on file  . Transportation needs:    Medical: Not on file    Non-medical: Not on file  Tobacco Use  . Smoking status: Never  Smoker  . Smokeless tobacco: Never Used  Substance and Sexual Activity  . Alcohol use: Yes    Alcohol/week: 0.0 oz    Comment: rare  . Drug use: No  . Sexual activity: Yes    Birth control/protection: IUD    Comment: mirena   Lifestyle  . Physical activity:    Days per week: Not on file    Minutes per session: Not on file  . Stress: Not on file  Relationships  . Social connections:    Talks on phone: Not on file    Gets together: Not on file    Attends religious service: Not on file    Active member of club or organization: Not on file    Attends meetings of clubs or organizations: Not on file    Relationship status: Not on file  . Intimate partner violence:    Fear of current or ex partner: Not on file    Emotionally abused: Not on file    Physically abused: Not on file    Forced sexual activity: Not on file  Other Topics Concern  . Not on file  Social History Narrative  . Not on file   Family History  Problem Relation Age of Onset  . Heart disease Sister   . Diabetes type II Mother   . Coronary artery disease Mother   . Diabetes Mother   . Heart disease Mother   . Hyperlipidemia Mother   . Hypertension Mother   . Diabetes type II Father   . Coronary artery disease Father   . Diabetes Father   . Heart disease Father   . Hyperlipidemia Father   . Diabetes Sister        Objective:    BP  112/70   Pulse 95   Temp 98 F (36.7 C) (Oral)   Resp 16   Ht 5' 5.16" (1.655 m)   Wt 283 lb (128.4 kg)   LMP 01/17/2012   SpO2 96%   BMI 46.87 kg/m  Physical Exam No results found. Depression screen [facility] 2/9 04/09/2017 02/02/2017 02/02/2017 12/14/2014 08/10/2014  Decreased Interest 0 0 0 1 0  Down, Depressed, Hopeless 0 0 0 1 0  PHQ - 2 Score 0 0 0 2 0  Altered sleeping - - - 3 -  Tired, decreased energy - - - 2 -  Change in appetite - - - 2 -  Feeling bad or failure about yourself  - - - 1 -  Trouble concentrating - - - 0 -  Moving slowly or fidgety/restless - - - 0 -  Suicidal thoughts - - - 0 -  PHQ-9 Score - - - 10 -   Fall Risk  04/09/2017 02/02/2017 02/02/2017 12/14/2014 08/10/2014  Falls in the past year? No No No No No  Number falls in past yr: - - - - -  Injury with [place]:  No diagnosis found.  ***  No orders of the defined types were placed in this encounter.  No orders of the defined types were placed in this encounter.   No follow-ups on file.   Barnet Benavides Elayne Guerin, M.D. Primary Care at [facility] previously [facility] [address] [place],   [address] [phone] phone [phone] fax

## 2017-04-10 DIAGNOSIS — F411 Generalized anxiety disorder: Secondary | ICD-10-CM | POA: Insufficient documentation

## 2017-04-18 ENCOUNTER — Encounter: Payer: Self-pay | Admitting: Physician Assistant

## 2017-05-31 ENCOUNTER — Encounter: Payer: Self-pay | Admitting: Family Medicine

## 2017-06-04 ENCOUNTER — Encounter: Payer: Self-pay | Admitting: Family Medicine

## 2017-06-04 DIAGNOSIS — G4733 Obstructive sleep apnea (adult) (pediatric): Secondary | ICD-10-CM | POA: Insufficient documentation

## 2017-06-04 DIAGNOSIS — Z9989 Dependence on other enabling machines and devices: Secondary | ICD-10-CM | POA: Insufficient documentation

## 2017-07-02 ENCOUNTER — Encounter: Payer: Self-pay | Admitting: Family Medicine

## 2017-07-04 MED ORDER — FLUOXETINE HCL 20 MG PO TABS: 60.0000 mg | ORAL_TABLET | Freq: Every day | ORAL | 1 refills | Status: AC

## 2017-07-04 MED ORDER — GABAPENTIN 300 MG PO CAPS: ORAL_CAPSULE | ORAL | 1 refills | Status: AC

## 2017-07-23 ENCOUNTER — Encounter: Payer: Self-pay | Admitting: Family Medicine

## 2017-07-23 ENCOUNTER — Ambulatory Visit (INDEPENDENT_AMBULATORY_CARE_PROVIDER_SITE_OTHER): Payer: BLUE CROSS/BLUE SHIELD | Admitting: Family Medicine

## 2017-07-23 ENCOUNTER — Other Ambulatory Visit: Payer: Self-pay

## 2017-07-23 VITALS — BP 132/86 | HR 107 | Temp 98.0°F | Resp 16 | Ht 65.35 in | Wt 293.0 lb

## 2017-07-23 DIAGNOSIS — Z114 Encounter for screening for human immunodeficiency virus [HIV]: Secondary | ICD-10-CM

## 2017-07-23 DIAGNOSIS — J4520 Mild intermittent asthma, uncomplicated: Secondary | ICD-10-CM | POA: Diagnosis not present

## 2017-07-23 DIAGNOSIS — Z9989 Dependence on other enabling machines and devices: Secondary | ICD-10-CM

## 2017-07-23 DIAGNOSIS — G4733 Obstructive sleep apnea (adult) (pediatric): Secondary | ICD-10-CM

## 2017-07-23 DIAGNOSIS — Z23 Encounter for immunization: Secondary | ICD-10-CM | POA: Diagnosis not present

## 2017-07-23 DIAGNOSIS — E240 Pituitary-dependent Cushing's disease: Secondary | ICD-10-CM

## 2017-07-23 DIAGNOSIS — E1142 Type 2 diabetes mellitus with diabetic polyneuropathy: Secondary | ICD-10-CM

## 2017-07-23 DIAGNOSIS — Z0000 Encounter for general adult medical examination without abnormal findings: Secondary | ICD-10-CM | POA: Diagnosis not present

## 2017-07-23 DIAGNOSIS — Z794 Long term (current) use of insulin: Secondary | ICD-10-CM

## 2017-07-23 DIAGNOSIS — F411 Generalized anxiety disorder: Secondary | ICD-10-CM

## 2017-07-23 DIAGNOSIS — E282 Polycystic ovarian syndrome: Secondary | ICD-10-CM

## 2017-07-23 DIAGNOSIS — I10 Essential (primary) hypertension: Secondary | ICD-10-CM | POA: Diagnosis not present

## 2017-07-23 DIAGNOSIS — E782 Mixed hyperlipidemia: Secondary | ICD-10-CM

## 2017-07-23 DIAGNOSIS — Z8542 Personal history of malignant neoplasm of other parts of uterus: Secondary | ICD-10-CM

## 2017-07-23 MED ORDER — LISINOPRIL-HYDROCHLOROTHIAZIDE 10-12.5 MG PO TABS: 1.0000 | ORAL_TABLET | Freq: Every day | ORAL | 3 refills | Status: AC

## 2017-07-23 MED ORDER — ATORVASTATIN CALCIUM 20 MG PO TABS: 20.0000 mg | ORAL_TABLET | Freq: Every day | ORAL | 3 refills | Status: AC

## 2017-07-23 MED ORDER — TRETINOIN 0.025 % EX GEL: Freq: Every day | CUTANEOUS | 1 refills | Status: AC

## 2017-07-23 MED ORDER — SPIRONOLACTONE 25 MG PO TABS: 25.0000 mg | ORAL_TABLET | Freq: Every day | ORAL | 1 refills | Status: AC

## 2017-07-23 NOTE — Progress Notes (Signed)
Subjective:    Patient ID: Kelli Allen, female    DOB: 04-Jan-1976, 42 y.o.   MRN: [id number]  07/23/2017  Annual Exam    HPI This 43 y.o. female presents for evaluation for COMPLETE PHYSICAL EXAMINATION.  Last physical:   Pap smear: hysterectomy for uterine cancer; five years out.  Non-compliant with follow-up.   Mammogram:  08/2015  [place] [facility] density:  04/2017. Eye exam:  07/25/16   Visual Acuity Screening   Right eye Left eye Both eyes  Without correction:     With correction: 20/20 20/20 20/20     BP Readings from Last 3 Encounters:  07/23/17 132/86  04/09/17 112/70  02/02/17 124/78   Wt Readings from Last 3 Encounters:  07/23/17 293 lb (132.9 kg)  04/09/17 283 lb (128.4 kg)  02/02/17 283 lb (128.4 kg)   Immunization History  Administered Date(s) Administered  . Influenza,inj,Quad PF,6+ Mos 01/08/2014, 10/15/2015, 10/26/2016  . Influenza-Unspecified 10/10/2014, 08/10/2015, 10/15/2015, 10/28/2016  . Pneumococcal Polysaccharide-23 01/08/2014  . Td 05/14/2004   Health Maintenance  Topic Date Due  . HIV Screening  10/10/1988  . TETANUS/TDAP  05/15/2014  . HEMOGLOBIN A1C  08/31/2014  . PAP SMEAR  12/17/2016  . OPHTHALMOLOGY EXAM  07/25/2017  . INFLUENZA VACCINE  08/09/2017  . FOOT EXAM  04/10/2018  . PNEUMOCOCCAL POLYSACCHARIDE VACCINE (2) 01/09/2019   Review of Systems  Constitutional: Negative for activity change, appetite change, chills, diaphoresis, fatigue, fever and unexpected weight change.  HENT: Negative for congestion, dental problem, drooling, ear discharge, ear pain, facial swelling, hearing loss, mouth sores, nosebleeds, postnasal drip, rhinorrhea, sinus pressure, sneezing, sore throat, tinnitus, trouble swallowing and voice change.   Eyes: Negative for photophobia, pain, discharge, redness, itching and visual disturbance.  Respiratory: Negative for apnea, cough, choking, chest tightness, shortness of breath, wheezing and stridor.     Cardiovascular: Negative for chest pain, palpitations and leg swelling.  Gastrointestinal: Negative for abdominal distention, abdominal pain, anal bleeding, blood in stool, constipation, diarrhea, nausea, rectal pain and vomiting.  Endocrine: Negative for cold intolerance, heat intolerance, polydipsia, polyphagia and polyuria.  Genitourinary: Negative for decreased urine volume, difficulty urinating, dyspareunia, dysuria, enuresis, flank pain, frequency, genital sores, hematuria, menstrual problem, pelvic pain, urgency, vaginal bleeding, vaginal discharge and vaginal pain.  Musculoskeletal: Negative for arthralgias, back pain, gait problem, joint swelling, myalgias, neck pain and neck stiffness.  Skin: Positive for rash. Negative for color change, pallor and wound.  Allergic/Immunologic: Negative for environmental allergies, food allergies and immunocompromised state.  Neurological: Negative for dizziness, tremors, seizures, syncope, facial asymmetry, speech difficulty, weakness, light-headedness, numbness and headaches.  Hematological: Negative for adenopathy. Does not bruise/bleed easily.  Psychiatric/Behavioral: Negative for agitation, behavioral problems, confusion, decreased concentration, dysphoric mood, hallucinations, self-injury, sleep disturbance and suicidal ideas. The patient is not nervous/anxious and is not hyperactive.     Past Medical History:  Diagnosis Date  . Abnormal Pap smear 2000  . Anxiety   . Asthma    no issues in years  . Cellulitis of lower leg    x3 on right leg  . 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 Cushing's syndrome ([place]) 09/25/2013   MRI done on 10/02/2013  . Diabetes mellitus   . Endometrial ca ([place]) 12/20/2011  . Family history of anesthesia complication    sister had hard time waking up and problems breathing  . Hypertension   . Irregular menstrual bleeding   . Neuromuscular  disorder ([place])  DIABETIC NEUROPATHY  . PCOS (polycystic ovarian syndrome)   . Tachycardia    Past Surgical History:  Procedure Laterality Date  . ABDOMINAL HYSTERECTOMY    . DILATION AND CURETTAGE OF UTERUS    . HYSTEROSCOPY W/D&C  01/24/2012   Procedure: DILATATION AND CURETTAGE /HYSTEROSCOPY;  Surgeon: Betsy Coder, MD;  Location: [facility];  Service: Gynecology;  Laterality: N/A;  with vulvar biopsies   . INTRAUTERINE DEVICE (IUD) INSERTION  01/24/2012   Procedure: INTRAUTERINE DEVICE (IUD) INSERTION;  Surgeon: Betsy Coder, MD;  Location: [facility];  Service: Gynecology;  Laterality: N/A;  . ROBOTIC ASSISTED TOTAL HYSTERECTOMY WITH BILATERAL SALPINGO OOPHERECTOMY Bilateral 07/02/2012   Procedure: ROBOTIC ASSISTED TOTAL HYSTERECTOMY WITH BILATERAL SALPINGO OOPHORECTOMY ;  Surgeon: Imagene Gurney A. Alycia Rossetti, MD;  Location: [facility];  Service: Gynecology;  Laterality: Bilateral;  . transsphenoid hypophysectomy  11/26/2013   ACTH secreting pituitary lesion causing Cushing's; [facility]; Angelene Giovanni, MD   Allergies  Allergen Reactions  . Antibacterial Hand Soap [Triclosan] Other (See Comments) and Rash    Dry, itchy patches on skin   Current Outpatient Medications on File Prior to Visit  Medication Sig Dispense Refill  . albuterol (PROAIR HFA) 108 (90 BASE) MCG/ACT inhaler Inhale 2 puffs into the lungs every 6 (six) hours as needed for shortness of breath. 18 g 1  . BAYER CONTOUR TEST test strip   98  . Dulaglutide 1.5 MG/0.5ML SOPN Inject into the skin.    . Ergocalciferol (VITAMIN D2) 2000 UNITS TABS Take 1 tablet by mouth daily.     Marland Kitchen FLUoxetine (PROZAC) 20 MG tablet Take 3 tablets (60 mg total) by mouth daily. 270 tablet 1  . gabapentin (NEURONTIN) 300 MG capsule TAKE 1-2 CAPSULES (300-600 MG TOTAL) BY MOUTH 3 (THREE) TIMES DAILY. 540 capsule 1  . insulin aspart (NOVOLOG FLEXPEN) 100 UNIT/ML FlexPen Inject 22 units with a correction 1:50 > 150 three times daily before meals    . Insulin  Glargine 300 UNIT/ML SOPN Inject into the skin.    Marland Kitchen ipratropium (ATROVENT) 0.03 % nasal spray Place 2 sprays into both nostrils 2 (two) times daily. 30 mL 0  . labetalol (NORMODYNE) 200 MG tablet TAKE 1 TABLET BY MOUTH 2 (TWO) TIMES DAILY. 180 tablet 1  . metformin (FORTAMET) 500 MG (OSM) 24 hr tablet Take 1,000 mg by mouth 2 (two) times daily with a meal.     . Multiple Vitamin (MULTIVITAMIN) capsule Take 1 capsule by mouth 2 (two) times daily.     . Omega-3 1000 MG CAPS Take 3 g by mouth daily. Reported on 02/03/2015    . OVER THE COUNTER MEDICATION Take 1 packet by mouth 2 (two) times daily. Nutralite multi  Vitamins    . OVER THE COUNTER MEDICATION Take 1 tablet by mouth 2 (two) times daily. Glucose Health Cromium Piccolate    . Probiotic Product (PROBIOTIC DAILY PO) Take 1 tablet by mouth daily. Reported on 02/03/2015    . promethazine (PHENERGAN) 25 MG tablet Take 12.5 mg by mouth every 6 (six) hours as needed for nausea or vomiting. Reported on 02/03/2015    . rOPINIRole (REQUIP) 0.25 MG tablet TAKE 1 TABLET (0.25 MG TOTAL) BY MOUTH AT BEDTIME. 30 tablet 0  . Semaglutide 0.25 or 0.5 MG/DOSE SOPN Inject into the skin.     No current facility-administered medications on file prior to visit.    Social History   Socioeconomic History  . Marital status: Married    Spouse name:  Not on file  . Number of children: Not on file  . Years of education: Not on file  . Highest education level: Not on file  Occupational History  . Not on file  Social Needs  . Financial resource strain: Not on file  . Food insecurity:    Worry: Not on file    Inability: Not on file  . Transportation needs:    Medical: Not on file    Non-medical: Not on file  Tobacco Use  . Smoking status: Never Smoker  . Smokeless tobacco: Never Used  Substance and Sexual Activity  . Alcohol use: Yes    Alcohol/week: 0.0 oz    Comment: rare  . Drug use: No  . Sexual activity: Yes    Birth control/protection: Surgical      Comment: hysterectomy  Lifestyle  . Physical activity:    Days per week: Not on file    Minutes per session: Not on file  . Stress: Not on file  Relationships  . Social connections:    Talks on phone: Not on file    Gets together: Not on file    Attends religious service: Not on file    Active member of club or organization: Not on file    Attends meetings of clubs or organizations: Not on file    Relationship status: Not on file  . Intimate partner violence:    Fear of current or ex partner: Not on file    Emotionally abused: Not on file    Physically abused: Not on file    Forced sexual activity: Not on file  Other Topics Concern  . Not on file  Social History Narrative   Marital status: married x 9 years in 2019.      Children: none; 3 stepchildren (19, 47. 16); no grandchildren      Lives: with husband in [place]      Employment: employed 40 hours per week; administrative work.       Tobacco: none      Alcohol:       Exercise:   Family History  Problem Relation Age of Onset  . Heart disease Sister   . Diabetes type II Mother   . Coronary artery disease Mother   . Diabetes Mother   . Heart disease Mother   . Hyperlipidemia Mother   . Hypertension Mother   . Diabetes type II Father   . Coronary artery disease Father   . Diabetes Father   . Heart disease Father   . Hyperlipidemia Father   . Diabetes Sister        Objective:    BP 132/86   Pulse (!) 107   Temp 98 F (36.7 C) (Oral)   Resp 16   Ht 5' 5.35" (1.66 m)   Wt 293 lb (132.9 kg)   LMP 01/17/2012   SpO2 95%   BMI 48.23 kg/m  Physical Exam  Constitutional: She is oriented to person, place, and time. She appears well-developed and well-nourished. No distress.  HENT:  Head: Normocephalic and atraumatic.  Right Ear: External ear normal.  Left Ear: External ear normal.  Nose: Nose normal.  Mouth/Throat: Oropharynx is clear and moist.  Eyes: Pupils are equal, round, and reactive to light.  Conjunctivae and EOM are normal.  Neck: Normal range of motion and full passive range of motion without pain. Neck supple. No JVD present. Carotid bruit is not present. No thyromegaly present.  Cardiovascular: Normal rate, regular rhythm and  normal heart sounds. Exam reveals no gallop and no friction rub.  No murmur heard. Pulmonary/Chest: Effort normal and breath sounds normal. She has no wheezes. She has no rales. Right breast exhibits no inverted nipple, no mass, no nipple discharge, no skin change and no tenderness. Left breast exhibits no inverted nipple, no mass, no nipple discharge, no skin change and no tenderness. No breast swelling, tenderness, discharge or bleeding. Breasts are symmetrical.  Abdominal: Soft. Bowel sounds are normal. She exhibits no distension and no mass. There is no tenderness. There is no rebound and no guarding.  Musculoskeletal:       Right shoulder: Normal.       Left shoulder: Normal.       Cervical back: Normal.  Lymphadenopathy:    She has no cervical adenopathy.  Neurological: She is alert and oriented to person, place, and time. She has normal reflexes. No cranial nerve deficit. She exhibits normal muscle tone. Coordination normal.  Skin: Skin is warm and dry. No rash noted. She is not diaphoretic. No erythema. No pallor.  Prominent follicles between bilateral breast with comedones.  Scattered similar follicular prominence under bilateral breast.  Excessive hair distribution along chin and anterior neck.  Psychiatric: She has a normal mood and affect. Her behavior is normal. Judgment and thought content normal.  Nursing note and vitals reviewed.  No results found. Depression screen [facility] 2/9 07/23/2017 04/09/2017 02/02/2017 02/02/2017 12/14/2014  Decreased Interest 0 0 0 0 1  Down, Depressed, Hopeless 0 0 0 0 1  PHQ - 2 Score 0 0 0 0 2  Altered sleeping - - - - 3  Tired, decreased energy - - - - 2  Change in appetite - - - - 2  Feeling bad or failure about  yourself  - - - - 1  Trouble concentrating - - - - 0  Moving slowly or fidgety/restless - - - - 0  Suicidal thoughts - - - - 0  PHQ-9 Score - - - - 10   Fall Risk  07/23/2017 04/09/2017 02/02/2017 02/02/2017 12/14/2014  Falls in the past year? No No No No No  Number falls in past yr: - - - - -  Injury with [place]:   1. Routine physical examination   2. Essential hypertension   3. Mild intermittent asthma without complication   4. OSA on CPAP   5. Cushing's disease ([place])   6. Type 2 diabetes mellitus with diabetic polyneuropathy, with long-term current use of insulin ([place])   7. Generalized anxiety disorder   8. Elevated cholesterol with high triglycerides   9. Screening for HIV (human immunodeficiency virus)   10. History of endometrial cancer   11. POLYCYSTIC OVARY   12. Morbid obesity ([place])   13. Need for Tdap vaccination     -anticipatory guidance provided --- exercise, weight loss, safe driving practices, calcium 600mg  bid or 3 servings of dairy daily. -obtain age appropriate screening labs and labs for chronic disease management. -s/p TDAP. History of endometrial cancer: Pelvic exam not performed today as patient is overdue for follow-up with gynecology oncology.  Patient to contact specialist today for follow-up appointment.  She is 5 years since hysterectomy for endometrial cancer. Polycystic ovarian syndrome with hirsutism and acne: Prescription for spironolactone 25 mg daily to help with hirsutism.  Prescription for Retin-A to applied to follicular prominence between  breast once to twice daily. Morbid Obesity:  Recommend weight loss, exercise for 30-60 minutes five days per week; recommend 1800 kcal restriction per day with a minimum of 60 grams of protein per day.  Eat 3 meals per day. Do not skip meals. Consider having a protein shake as a meal replacement to aid with eliminating meal skipping. Look for products with <220  calories, <7 gm sugar, and 20-30 gm protein.  Eat breakfast within 2 hours of getting up.   Make  your plate non-starchy vegetables,  protein, and  carbohydrates at lunch and dinner.   Aim for at least 64 oz. of calorie-free beverages daily (water, Crystal Light, diet green tea, etc.). Eliminate any sugary beverages such as regular soda, sweet tea, or fruit juice.   Pay attention to hunger and fullness cues.  Stop eating once you feel satisfied; don't wait until you feel full, stuffed, or sick from eating.  Choose lean meats and low fat/fat free dairy products.  Choose foods high in fiber such as fruits, vegetables, and whole grains (brown rice, whole wheat pasta, whole wheat bread, etc.).  Limit foods with added sugar to <7 gm per serving.  Always eat in the kitchen/dining room.  Never eat in the bedroom or in front of the TV.     Orders Placed This Encounter  Procedures  . Tdap vaccine greater than or equal to 7yo IM  . CBC with Differential/Platelet  . Comprehensive metabolic panel    Order Specific Question:   Has the patient fasted?    Answer:   No  . Lipid panel    Order Specific Question:   Has the patient fasted?    Answer:   No  . TSH  . Microalbumin / creatinine urine ratio  . HIV antibody  . POCT urinalysis dipstick  . EKG 12-Lead   Meds ordered this encounter  Medications  . lisinopril-hydrochlorothiazide (PRINZIDE,ZESTORETIC) 10-12.5 MG tablet    Sig: Take 1 tablet by mouth daily.    Dispense:  90 tablet    Refill:  3  . atorvastatin (LIPITOR) 20 MG tablet    Sig: Take 1 tablet (20 mg total) by mouth daily at 6 PM. TAKE ONE TABLET BY MOUTH ONE TIME DAILY AT 6 PM.    Dispense:  90 tablet    Refill:  3  . spironolactone (ALDACTONE) 25 MG tablet    Sig: Take 1 tablet (25 mg total) by mouth daily.    Dispense:  90 tablet    Refill:  1  . tretinoin (RETIN-A) 0.025 % gel    Sig: Apply topically at bedtime.    Dispense:  45 g    Refill:  1    Return in  about 6 months (around 01/23/2018) for follow-up chronic medical conditions [place].   Melannie Metzner Elayne Guerin, M.D. Primary Care at [facility] previously [facility] [address] [place], [place]  [address] [phone] phone [phone] fax

## 2017-07-23 NOTE — Patient Instructions (Addendum)
Call gynecology-oncology for an appointment. Undergo MRI brain and see endocrinologist and neurosurgery before next appointment.   IF you received an x-ray today, you will receive an invoice from [facility]. Please contact [facility] at [phone] with questions or concerns regarding your invoice.   IF you received labwork today, you will receive an invoice from [facility]. Please contact [facility] at [phone] with questions or concerns regarding your invoice.   Our billing staff will not be able to assist you with questions regarding bills from these companies.  You will be contacted with the lab results as soon as they are available. The fastest way to get your results is to activate your My Chart account. Instructions are located on the last page of this paperwork. If you have not heard from [place] regarding the results in 2 weeks, please contact this office.      Preventive Care 40-64 Years, Female Preventive care refers to lifestyle choices and visits with your health care provider that can promote health and wellness. What does preventive care include?  A yearly physical exam. This is also called an annual well check.  Dental exams once or twice a year.  Routine eye exams. Ask your health care provider how often you should have your eyes checked.  Personal lifestyle choices, including: ? Daily care of your teeth and gums. ? Regular physical activity. ? Eating a healthy diet. ? Avoiding tobacco and drug use. ? Limiting alcohol use. ? Practicing safe sex. ? Taking low-dose aspirin daily starting at age 36. ? Taking vitamin and mineral supplements as recommended by your health care provider. What happens during an annual well check? The services and screenings done by your health care provider during your annual well check will depend on your age, overall health, lifestyle risk factors, and family history of disease. Counseling Your health care provider  may ask you questions about your:  Alcohol use.  Tobacco use.  Drug use.  Emotional well-being.  Home and relationship well-being.  Sexual activity.  Eating habits.  Work and work Statistician.  Method of birth control.  Menstrual cycle.  Pregnancy history.  Screening You may have the following tests or measurements:  Height, weight, and BMI.  Blood pressure.  Lipid and cholesterol levels. These may be checked every 5 years, or more frequently if you are over 77 years old.  Skin check.  Lung cancer screening. You may have this screening every year starting at age 99 if you have a 30-pack-year history of smoking and currently smoke or have quit within the past 15 years.  Fecal occult blood test (FOBT) of the stool. You may have this test every year starting at age 32.  Flexible sigmoidoscopy or colonoscopy. You may have a sigmoidoscopy every 5 years or a colonoscopy every 10 years starting at age 39.  Hepatitis C blood test.  Hepatitis B blood test.  Sexually transmitted disease (STD) testing.  Diabetes screening. This is done by checking your blood sugar (glucose) after you have not eaten for a while (fasting). You may have this done every 1-3 years.  Mammogram. This may be done every 1-2 years. Talk to your health care provider about when you should start having regular mammograms. This may depend on whether you have a family history of breast cancer.  BRCA-related cancer screening. This may be done if you have a family history of breast, ovarian, tubal, or peritoneal cancers.  Pelvic exam and Pap test. This may be done every 3 years starting  at age 63. Starting at age 95, this may be done every 5 years if you have a Pap test in combination with an HPV test.  Bone density scan. This is done to screen for osteoporosis. You may have this scan if you are at high risk for osteoporosis.  Discuss your test results, treatment options, and if necessary, the need for  more tests with your health care provider. Vaccines Your health care provider may recommend certain vaccines, such as:  Influenza vaccine. This is recommended every year.  Tetanus, diphtheria, and acellular pertussis (Tdap, Td) vaccine. You may need a Td booster every 10 years.  Varicella vaccine. You may need this if you have not been vaccinated.  Zoster vaccine. You may need this after age 63.  Measles, mumps, and rubella (MMR) vaccine. You may need at least one dose of MMR if you were born in 1957 or later. You may also need a second dose.  Pneumococcal 13-valent conjugate (PCV13) vaccine. You may need this if you have certain conditions and were not previously vaccinated.  Pneumococcal polysaccharide (PPSV23) vaccine. You may need one or two doses if you smoke cigarettes or if you have certain conditions.  Meningococcal vaccine. You may need this if you have certain conditions.  Hepatitis A vaccine. You may need this if you have certain conditions or if you travel or work in places where you may be exposed to hepatitis A.  Hepatitis B vaccine. You may need this if you have certain conditions or if you travel or work in places where you may be exposed to hepatitis B.  Haemophilus influenzae type b (Hib) vaccine. You may need this if you have certain conditions.  Talk to your health care provider about which screenings and vaccines you need and how often you need them. This information is not intended to replace advice given to you by your health care provider. Make sure you discuss any questions you have with your health care provider. Document Released: 01/22/2015 Document Revised: 09/15/2015 Document Reviewed: 10/27/2014 Elsevier Interactive Patient Education  [employer].

## 2017-07-24 ENCOUNTER — Telehealth: Payer: Self-pay | Admitting: *Deleted

## 2017-07-24 LAB — COMPREHENSIVE METABOLIC PANEL
A/G RATIO: 1.7 (ref 1.2–2.2)
ALBUMIN: 4.5 g/dL (ref 3.5–5.5)
ALK PHOS: 75 IU/L (ref 39–117)
ALT: 34 IU/L — AB (ref 0–32)
AST: 26 IU/L (ref 0–40)
BILIRUBIN TOTAL: 0.2 mg/dL (ref 0.0–1.2)
BUN / CREAT RATIO: 19 (ref 9–23)
BUN: 15 mg/dL (ref 6–24)
CHLORIDE: 97 mmol/L (ref 96–106)
CO2: 24 mmol/L (ref 20–29)
Calcium: 9.9 mg/dL (ref 8.7–10.2)
Creatinine, Ser: 0.77 mg/dL (ref 0.57–1.00)
GFR calc Af Amer: 109 mL/min/{1.73_m2} (ref >59)
GFR calc non Af Amer: 95 mL/min/{1.73_m2} (ref >59)
Globulin, Total: 2.7 g/dL (ref 1.5–4.5)
Glucose: 255 mg/dL — ABNORMAL HIGH (ref 65–99)
POTASSIUM: 4.4 mmol/L (ref 3.5–5.2)
Sodium: 138 mmol/L (ref 134–144)
Total Protein: 7.2 g/dL (ref 6.0–8.5)

## 2017-07-24 LAB — CBC WITH DIFFERENTIAL/PLATELET
Basophils Absolute: 0.1 10*3/uL (ref 0.0–0.2)
Basos: 1 %
EOS (ABSOLUTE): 0.8 10*3/uL — AB (ref 0.0–0.4)
Eos: 8 %
Hematocrit: 40.4 % (ref 34.0–46.6)
Hemoglobin: 12.9 g/dL (ref 11.1–15.9)
Immature Grans (Abs): 0 10*3/uL (ref 0.0–0.1)
Immature Granulocytes: 0 %
LYMPHS ABS: 3.2 10*3/uL — AB (ref 0.7–3.1)
Lymphs: 30 %
MCH: 31 pg (ref 26.6–33.0)
MCHC: 31.9 g/dL (ref 31.5–35.7)
MCV: 97 fL (ref 79–97)
Monocytes Absolute: 0.5 10*3/uL (ref 0.1–0.9)
Monocytes: 4 %
NEUTROS ABS: 6.2 10*3/uL (ref 1.4–7.0)
Neutrophils: 57 %
PLATELETS: 331 10*3/uL (ref 150–450)
RBC: 4.16 x10E6/uL (ref 3.77–5.28)
RDW: 13.6 % (ref 12.3–15.4)
WBC: 10.8 10*3/uL (ref 3.4–10.8)

## 2017-07-24 LAB — LIPID PANEL
CHOL/HDL RATIO: 3.1 ratio (ref 0.0–4.4)
CHOLESTEROL TOTAL: 170 mg/dL (ref 100–199)
HDL: 54 mg/dL (ref >39)
LDL CALC: 42 mg/dL (ref 0–99)
TRIGLYCERIDES: 369 mg/dL — AB (ref 0–149)
VLDL Cholesterol Cal: 74 mg/dL — ABNORMAL HIGH (ref 5–40)

## 2017-07-24 LAB — HIV ANTIBODY (ROUTINE TESTING W REFLEX): HIV Screen 4th Generation wRfx: NONREACTIVE

## 2017-07-24 LAB — MICROALBUMIN / CREATININE URINE RATIO: Creatinine, Urine: 11 mg/dL

## 2017-07-24 LAB — TSH: TSH: 1.61 u[IU]/mL (ref 0.450–4.500)

## 2017-07-24 NOTE — Telephone Encounter (Signed)
Returned the patient's call and scheduled a follow up appt for August 23rd

## 2017-08-24 ENCOUNTER — Telehealth: Payer: Self-pay | Admitting: Family Medicine

## 2017-08-24 NOTE — Telephone Encounter (Signed)
Called patient to let her know her medication (tretinoin 0.025% gel) was approved by her insurance company.

## 2017-08-30 NOTE — Progress Notes (Signed)
[place] [facility]   Progress Note: Established Patient Follow-Up Visit      Chief Complaint  Patient presents with  . endometrial cancer    MD follow up visit  History of a stage IA grade 1 resected 06/2012 endometrioid adenocarcinoma  HPI:  Interval History She was last seen 06/2015 by Dr. Alycia Rossetti. Since that time she has been living in [place].  She did not follow-up with a GYN oncologist while she was there.  Given her lack of follow-up she finally decided to visit [place] here again while she was visiting family.  She has no complaints today.  She specifically denies pain and bleeding.   Presenting History:  Had irregular periods and then passed some tissue in October of 2012. She was seen by Dr. Charlesetta Garibaldi and apparently Dr. Charlesetta Garibaldi wanted to perform a history of sonogram but the patient declined. She was seen in followup with Dr. Charlesetta Garibaldi in September was started on birth control pills. In October or November she had some spotting and it was fairly regular for a few days. She ultimately underwent a hysteroscopy D&C that Dr. Charlesetta Garibaldi and 1 he to perform and underwent an endometrial biopsy on November 16, 2010. [name] 42-year-old a polyp. The uterus measures 8.04 x 4.3 cm with normal appearing adnexa. There was a 1.2 cm polyp noted. She had an endometrial biopsy that revealed a grade 1 endometrial carcinoma arising atypical complex hyperplasia. She underwent a D&C IUD placement with Dr. Charlesetta Garibaldi on January 24, 2012. Pathology revealed an endometrioid adenocarcinoma grade 1 arising in a background of atypical complex hyperplasia. She also had some benign melanotic nevi removed at that time.  She got a second opinion with Dr. Gaetano Net who told her that the most definitive treatment would be hysterectomy.   She was seen by Dr. Alycia Rossetti April 2014 that revealed persistent adenocarcinoma. She decided she wanted to proceed with  hysterectom  On 07/02/2012 showing a robotic hysterectomy bilateral salpingo-oophorectomy. Pathology revealed: Uterus +/- tubes/ovaries, neoplastic, with cervix - ENDOMETRIOID CARCINOMA, FIGO GRADE I. - BACKGROUND ATYPICAL COMPLEX HYPERPLASIA AND A ENDOMETRIAL POLYP. PLEASE SEE COMMENT. - BILATERAL OVARIES AND FALLOPIAN TUBES: NO EVIDENCE OF ATYPIA OR MALIGNANCY. - CERVIX: BENIGN SQUAMOUS MUCOSA AND ENDOCERVICAL MUCOSA, NO DYSPLASIA OR MALIGNANCY. - LEIOMYOMA.  Imported EPIC Oncologic History:    History of endometrial cancer   02/03/2012 Initial Diagnosis    Endometrial cancer    07/02/2012 Surgery    robotic hyst BSO, 1A grade 1, no MI, no LVIS     ECOG PERFORMANCE STATUS: 0 - Asymptomatic  Measurement of disease: . Clinical exam.  Radiology: . None relevant for today  Outpatient Encounter Medications as of 08/31/2017  Medication Sig  . albuterol (PROAIR HFA) 108 (90 BASE) MCG/ACT inhaler Inhale 2 puffs into the lungs every 6 (six) hours as needed for shortness of breath.  Marland Kitchen atorvastatin (LIPITOR) 20 MG tablet Take 1 tablet (20 mg total) by mouth daily at 6 PM. TAKE ONE TABLET BY MOUTH ONE TIME DAILY AT 6 PM.  . BAYER CONTOUR TEST test strip Patient uses one touch strips  . Dulaglutide 1.5 MG/0.5ML SOPN Inject into the skin.  . Ergocalciferol (VITAMIN D2) 2000 UNITS TABS Take 1 tablet by mouth daily.   Marland Kitchen FLUoxetine (PROZAC) 20 MG tablet Take 3 tablets (60 mg total) by mouth daily.  Marland Kitchen gabapentin (NEURONTIN) 300 MG capsule TAKE 1-2 CAPSULES (300-600 MG TOTAL) BY MOUTH 3 (THREE) TIMES DAILY.  Marland Kitchen  insulin aspart (NOVOLOG FLEXPEN) 100 UNIT/ML FlexPen Inject 22 units with a correction 1:50 > 150 three times daily before meals  . Insulin Glargine 300 UNIT/ML SOPN Inject into the skin.  Marland Kitchen ipratropium (ATROVENT) 0.03 % nasal spray Place 2 sprays into both nostrils 2 (two) times daily.  Marland Kitchen labetalol (NORMODYNE) 200 MG tablet TAKE 1 TABLET BY MOUTH 2 (TWO) TIMES DAILY.  Marland Kitchen  lisinopril-hydrochlorothiazide (PRINZIDE,ZESTORETIC) 10-12.5 MG tablet Take 1 tablet by mouth daily.  . metformin (FORTAMET) 500 MG (OSM) 24 hr tablet Take 1,000 mg by mouth 2 (two) times daily with a meal.   . Multiple Vitamin (MULTIVITAMIN) capsule Take 1 capsule by mouth 2 (two) times daily.   . Omega-3 1000 MG CAPS Take 3 g by mouth daily. Reported on 02/03/2015  . OVER THE COUNTER MEDICATION Take 1 packet by mouth 2 (two) times daily. Nutralite multi  Vitamins  . OVER THE COUNTER MEDICATION Take 1 tablet by mouth 2 (two) times daily. Glucose Health Cromium Piccolate  . Probiotic Product (PROBIOTIC DAILY PO) Take 1 tablet by mouth daily. Reported on 02/03/2015  . rOPINIRole (REQUIP) 0.25 MG tablet TAKE 1 TABLET (0.25 MG TOTAL) BY MOUTH AT BEDTIME.  Marland Kitchen spironolactone (ALDACTONE) 25 MG tablet Take 1 tablet (25 mg total) by mouth daily.  Marland Kitchen tretinoin (RETIN-A) 0.025 % gel Apply topically at bedtime.  . [DISCONTINUED] promethazine (PHENERGAN) 25 MG tablet Take 12.5 mg by mouth every 6 (six) hours as needed for nausea or vomiting. Reported on 02/03/2015  . [DISCONTINUED] Semaglutide 0.25 or 0.5 MG/DOSE SOPN Inject into the skin.   No facility-administered encounter medications on file as of 08/31/2017.    Allergies  Allergen Reactions  . Antibacterial Hand Soap [Triclosan] Other (See Comments) and Rash    Dry, itchy patches on skin    Past Medical History:  Diagnosis Date  . Abnormal Pap smear 2000  . Anxiety   . Asthma    no issues in years  . Cellulitis of lower leg    x3 on right leg  . Cushing syndrome ([place]) 11/26/2013   s/p transsphenoidal hypophysectomy for ACTH secreting pituitary lesion  . Cushing's syndrome ([place]) 09/25/2013   MRI done on 10/02/2013  . Diabetes mellitus   . Endometrial ca ([place]) 12/20/2011  . Family history of anesthesia complication    sister had hard time waking up and problems breathing  . Hypertension   . Irregular menstrual bleeding   . Neuromuscular  disorder ([place])    DIABETIC NEUROPATHY  . PCOS (polycystic ovarian syndrome)   . Tachycardia    Past Surgical History:  Procedure Laterality Date  . ABDOMINAL HYSTERECTOMY    . DILATION AND CURETTAGE OF UTERUS    . HYSTEROSCOPY W/D&C  01/24/2012   Procedure: DILATATION AND CURETTAGE /HYSTEROSCOPY;  Surgeon: Betsy Coder, MD;  Location: [facility];  Service: Gynecology;  Laterality: N/A;  with vulvar biopsies   . INTRAUTERINE DEVICE (IUD) INSERTION  01/24/2012   Procedure: INTRAUTERINE DEVICE (IUD) INSERTION;  Surgeon: Betsy Coder, MD;  Location: [facility];  Service: Gynecology;  Laterality: N/A;  . ROBOTIC ASSISTED TOTAL HYSTERECTOMY WITH BILATERAL SALPINGO OOPHERECTOMY Bilateral 07/02/2012   Procedure: ROBOTIC ASSISTED TOTAL HYSTERECTOMY WITH BILATERAL SALPINGO OOPHORECTOMY ;  Surgeon: Imagene Gurney A. Alycia Rossetti, MD;  Location: [facility];  Service: Gynecology;  Laterality: Bilateral;  . transsphenoid hypophysectomy  11/26/2013   ACTH secreting pituitary lesion causing Cushing's; [facility]; Angelene Giovanni, MD        Past Gynecological History:  GYNECOLOGIC HISTORY:  . Patient's last menstrual period was 01/17/2012.  Family Hx:  Family History  Problem Relation Age of Onset  . Heart disease Sister   . Diabetes type II Mother   . Coronary artery disease Mother   . Diabetes Mother   . Heart disease Mother   . Hyperlipidemia Mother   . Hypertension Mother   . Diabetes type II Father   . Coronary artery disease Father   . Diabetes Father   . Heart disease Father   . Hyperlipidemia Father   . Diabetes Sister    Social Hx:  Marland Kitchen Tobacco use: none . Alcohol use: rare . Illicit Drug use: none     Review of Systems: Review of Systems  Constitutional: Negative.   HENT:  Negative.   Eyes: Negative.   Respiratory: Negative.   Cardiovascular: Negative.   Gastrointestinal: Negative.   Endocrine: Negative.   Genitourinary: Negative.    Musculoskeletal: Negative.   Skin: Negative.    Neurological: Negative.   Hematological: Negative.   Psychiatric/Behavioral: Negative.     Vitals:  Vitals:   08/31/17 0955  Weight: 288 lb 6.4 oz (130.8 kg)  Height: 5' 5.35" (1.66 m)    Vitals:   08/31/17 0955  BP: 126/72  Pulse: 88  Resp: 18  Temp: 97.7 F (36.5 C)  SpO2: 99%   Body mass index is 47.48 kg/m.   Physical Exam: General :  Obese Well developed, 42 y.o., female in no apparent distress HEENT:  Normocephalic/atraumatic, symmetric, EOMI, eyelids normal Neck:   Supple, no masses.  Lymphatics:  No cervical/ submandibular/ supraclavicular/ infraclavicular/ inguinal adenopathy Respiratory:  Respirations unlabored, no use of accessory muscles CV:   Deferred Breast:  Deferred Musculoskeletal: No CVA tenderness, normal muscle strength. Abdomen:  Obese. Soft, non-tender and nondistended. No evidence of hernia. No masses. Extremities:  No lymphedema, no erythema, non-tender. Skin:   Normal inspection Neuro/Psych:  No focal motor deficit, no abnormal mental status. Normal gait. Normal affect. Alert and oriented to person, place, and time  Genito Urinary: Vulva: Normal external female genitalia.  Bladder/urethra: Urethral meatus normal in size and location. No lesions or   masses, well supported bladder Speculum exam: Vagina: No lesion, no discharge, no bleeding. Bimanual exam: Cervix/Uterus/Adnexa: Surgically absent  Adnexa: No masses. Rectovaginal:  Good tone, no masses, no cul de sac nodularity, no parametrial involvement or nodularity.   Assessment  Stage IA endometrial cancer 06/2012  Plan  1. She is technically 5 years NED. ? She may return to see [place] in 1 year or a regular gynecologist closer to home 2. Pap smear was done today of the vaginal cuff.  She has quite a long vaginal canal that makes visualization with the speculum and palpation difficult at the apex. ? We will let her know the results and triage appropriately  Face to face time with patient  was 15 minutes. Over 50% of this time was spent on counseling and coordination of care.  Isabel Caprice, MD  08/31/2017, 10:40 AM    Cc: Wardell Honour, MD (PCP)

## 2017-08-31 ENCOUNTER — Encounter: Payer: Self-pay | Admitting: Obstetrics

## 2017-08-31 ENCOUNTER — Inpatient Hospital Stay: Payer: BLUE CROSS/BLUE SHIELD | Attending: Obstetrics | Admitting: Obstetrics

## 2017-08-31 ENCOUNTER — Other Ambulatory Visit (HOSPITAL_COMMUNITY)
Admission: RE | Admit: 2017-08-31 | Discharge: 2017-08-31 | Disposition: A | Discharge status: Home / Self Care | Payer: BLUE CROSS/BLUE SHIELD | Source: Ambulatory Visit | Attending: Obstetrics | Admitting: Obstetrics

## 2017-08-31 VITALS — BP 126/72 | HR 88 | Temp 97.7°F | Resp 18 | Ht 65.35 in | Wt 288.4 lb

## 2017-08-31 DIAGNOSIS — Z8542 Personal history of malignant neoplasm of other parts of uterus: Secondary | ICD-10-CM | POA: Insufficient documentation

## 2017-08-31 DIAGNOSIS — Z90710 Acquired absence of both cervix and uterus: Secondary | ICD-10-CM | POA: Insufficient documentation

## 2017-08-31 DIAGNOSIS — C541 Malignant neoplasm of endometrium: Secondary | ICD-10-CM

## 2017-08-31 DIAGNOSIS — Z08 Encounter for follow-up examination after completed treatment for malignant neoplasm: Secondary | ICD-10-CM | POA: Insufficient documentation

## 2017-08-31 DIAGNOSIS — Z90722 Acquired absence of ovaries, bilateral: Secondary | ICD-10-CM | POA: Insufficient documentation

## 2017-08-31 NOTE — Patient Instructions (Signed)
1. We will call you about your test result 2. Return in one year for a pelvic exam or followup annually with a local Gyn

## 2017-09-04 ENCOUNTER — Telehealth: Payer: Self-pay

## 2017-09-04 LAB — CYTOLOGY - PAP: Diagnosis: NEGATIVE

## 2017-09-04 NOTE — Telephone Encounter (Signed)
Pt notified about pap results: negative.  No questions or concerns voiced. 

## 2017-09-29 ENCOUNTER — Other Ambulatory Visit: Payer: Self-pay

## 2017-09-29 ENCOUNTER — Encounter (HOSPITAL_BASED_OUTPATIENT_CLINIC_OR_DEPARTMENT_OTHER): Payer: Self-pay | Admitting: Emergency Medicine

## 2017-09-29 ENCOUNTER — Emergency Department (HOSPITAL_BASED_OUTPATIENT_CLINIC_OR_DEPARTMENT_OTHER)
Admission: EM | Admit: 2017-09-29 | Discharge: 2017-09-29 | Disposition: A | Discharge status: Home / Self Care | Payer: BLUE CROSS/BLUE SHIELD | Source: Home / Self Care | Attending: Emergency Medicine | Admitting: Emergency Medicine

## 2017-09-29 ENCOUNTER — Emergency Department (HOSPITAL_BASED_OUTPATIENT_CLINIC_OR_DEPARTMENT_OTHER)
Admission: EM | Admit: 2017-09-29 | Discharge: 2017-09-29 | Disposition: A | Discharge status: Home / Self Care | Payer: BLUE CROSS/BLUE SHIELD | Attending: Emergency Medicine | Admitting: Emergency Medicine

## 2017-09-29 DIAGNOSIS — E1142 Type 2 diabetes mellitus with diabetic polyneuropathy: Secondary | ICD-10-CM | POA: Insufficient documentation

## 2017-09-29 DIAGNOSIS — R21 Rash and other nonspecific skin eruption: Secondary | ICD-10-CM | POA: Insufficient documentation

## 2017-09-29 DIAGNOSIS — I10 Essential (primary) hypertension: Secondary | ICD-10-CM | POA: Insufficient documentation

## 2017-09-29 DIAGNOSIS — Z794 Long term (current) use of insulin: Secondary | ICD-10-CM

## 2017-09-29 DIAGNOSIS — Z79899 Other long term (current) drug therapy: Secondary | ICD-10-CM

## 2017-09-29 DIAGNOSIS — B029 Zoster without complications: Secondary | ICD-10-CM

## 2017-09-29 DIAGNOSIS — E119 Type 2 diabetes mellitus without complications: Secondary | ICD-10-CM | POA: Insufficient documentation

## 2017-09-29 DIAGNOSIS — J45909 Unspecified asthma, uncomplicated: Secondary | ICD-10-CM | POA: Insufficient documentation

## 2017-09-29 DIAGNOSIS — R739 Hyperglycemia, unspecified: Secondary | ICD-10-CM

## 2017-09-29 LAB — CBG MONITORING, ED: Glucose-Capillary: 355 mg/dL — ABNORMAL HIGH (ref 70–99)

## 2017-09-29 MED ORDER — VALACYCLOVIR HCL 500 MG PO TABS
1000.0000 mg | ORAL_TABLET | Freq: Once | ORAL | Status: AC
  Administered 2017-09-29: 1000 mg via ORAL
  Filled 2017-09-29: qty 2

## 2017-09-29 MED ORDER — VALACYCLOVIR HCL 1 G PO TABS: 1000.0000 mg | ORAL_TABLET | Freq: Three times a day (TID) | ORAL | 0 refills | Status: DC

## 2017-09-29 MED ORDER — PREDNISONE 50 MG PO TABS
60.0000 mg | ORAL_TABLET | Freq: Once | ORAL | Status: AC
  Administered 2017-09-29: 60 mg via ORAL
  Filled 2017-09-29: qty 1

## 2017-09-29 MED ORDER — PREDNISONE 10 MG (21) PO TBPK: ORAL_TABLET | Freq: Every day | ORAL | 0 refills | Status: AC

## 2017-09-29 MED ORDER — VALACYCLOVIR HCL 1 G PO TABS: 1000.0000 mg | ORAL_TABLET | Freq: Three times a day (TID) | ORAL | 0 refills | Status: AC

## 2017-09-29 NOTE — ED Provider Notes (Signed)
[facility] EMERGENCY DEPARTMENT Provider Note   CSN: [id number] Arrival date & time: 09/29/17  1201     History   Chief Complaint Chief Complaint  Patient presents with  . Rash    HPI Kelli Allen is a 42 y.o. female for evaluation of rash.  Patient states last night, she started to develop a rash of her left side back.  In the past 12 hours, rash is continued to spread.  It is itchy.  She has tried essential oils, has not tried anything else.  She denies exposures, including medications, shampoos, conditioners, soaps, detergents, or environments.  Patient states she has been gardening recently, although last was gardening 5 days ago.  Reports a history of anxiety, cellulitis, Cushing's, diabetes, hypertension.  Denies fevers, chills, headache, neck stiffness, nausea, vomiting.  HPI  Past Medical History:  Diagnosis Date  . Abnormal Pap smear 2000  . Anxiety   . Asthma    no issues in years  . Cellulitis of lower leg    x3 on right leg  . Cushing syndrome ([place]) 11/26/2013   s/p transsphenoidal hypophysectomy for ACTH secreting pituitary lesion  . Cushing's syndrome ([place]) 09/25/2013   MRI done on 10/02/2013  . Diabetes mellitus   . Endometrial ca ([place]) 12/20/2011  . Family history of anesthesia complication    sister had hard time waking up and problems breathing  . Hypertension   . Irregular menstrual bleeding   . Neuromuscular disorder ([place])    DIABETIC NEUROPATHY  . PCOS (polycystic ovarian syndrome)   . Tachycardia     Patient Active Problem List   Diagnosis Date Noted  . OSA on CPAP 06/04/2017  . Generalized anxiety disorder 04/10/2017  . Osteopenia 04/24/2014  . Hyponatremia   . Type 2 diabetes mellitus with diabetic polyneuropathy ([place]) 01/13/2014  . Sinus tachycardia by electrocardiogram 01/01/2014  . Disease of pituitary gland ([place]) 11/25/2013  . Cushing's disease ([place]) 10/15/2013  . History of endometrial cancer 07/03/2012  . Elevated  cholesterol with high triglycerides 03/02/2011  . HTN (hypertension) 02/26/2011  . POLYCYSTIC OVARY 03/08/2006  . Asthma 03/08/2006  . Deficiency, disaccharidase intestinal 03/08/2006    Past Surgical History:  Procedure Laterality Date  . ABDOMINAL HYSTERECTOMY    . DILATION AND CURETTAGE OF UTERUS    . HYSTEROSCOPY W/D&C  01/24/2012   Procedure: DILATATION AND CURETTAGE /HYSTEROSCOPY;  Surgeon: Betsy Coder, MD;  Location: [facility];  Service: Gynecology;  Laterality: N/A;  with vulvar biopsies   . INTRAUTERINE DEVICE (IUD) INSERTION  01/24/2012   Procedure: INTRAUTERINE DEVICE (IUD) INSERTION;  Surgeon: Betsy Coder, MD;  Location: [facility];  Service: Gynecology;  Laterality: N/A;  . ROBOTIC ASSISTED TOTAL HYSTERECTOMY WITH BILATERAL SALPINGO OOPHERECTOMY Bilateral 07/02/2012   Procedure: ROBOTIC ASSISTED TOTAL HYSTERECTOMY WITH BILATERAL SALPINGO OOPHORECTOMY ;  Surgeon: Imagene Gurney A. Alycia Rossetti, MD;  Location: [facility];  Service: Gynecology;  Laterality: Bilateral;  . transsphenoid hypophysectomy  11/26/2013   ACTH secreting pituitary lesion causing Cushing's; [facility]; Angelene Giovanni, MD     OB History    Gravida  0   Para      Term      Preterm      AB      Living        SAB      TAB      Ectopic      Multiple      Live Births  Home Medications    Prior to Admission medications   Medication Sig Start Date End Date Taking? Authorizing Provider  albuterol (PROAIR HFA) 108 (90 BASE) MCG/ACT inhaler Inhale 2 puffs into the lungs every 6 (six) hours as needed for shortness of breath. 05/14/14   Wardell Honour, MD  atorvastatin (LIPITOR) 20 MG tablet Take 1 tablet (20 mg total) by mouth daily at 6 PM. TAKE ONE TABLET BY MOUTH ONE TIME DAILY AT 6 PM. 07/23/17   Wardell Honour, MD  BAYER CONTOUR TEST test strip Patient uses one touch strips 12/12/13   [provider]  Dulaglutide 1.5 MG/0.5ML SOPN Inject into the skin. 04/30/17   [provider]  Ergocalciferol (VITAMIN D2) 2000 UNITS TABS Take 1 tablet by mouth daily.     [provider]  FLUoxetine (PROZAC) 20 MG tablet Take 3 tablets (60 mg total) by mouth daily. 07/04/17   Wardell Honour, MD  gabapentin (NEURONTIN) 300 MG capsule TAKE 1-2 CAPSULES (300-600 MG TOTAL) BY MOUTH 3 (THREE) TIMES DAILY. 07/04/17   Wardell Honour, MD  insulin aspart (NOVOLOG FLEXPEN) 100 UNIT/ML FlexPen Inject 22 units with a correction 1:50 > 150 three times daily before meals 10/10/13   [provider]  Insulin Glargine 300 UNIT/ML SOPN Inject into the skin. 04/30/17   [provider]  ipratropium (ATROVENT) 0.03 % nasal spray Place 2 sprays into both nostrils 2 (two) times daily. 02/02/17   Ivar Drape D, PA  labetalol (NORMODYNE) 200 MG tablet TAKE 1 TABLET BY MOUTH 2 (TWO) TIMES DAILY. 12/31/14   Wardell Honour, MD  lisinopril-hydrochlorothiazide (PRINZIDE,ZESTORETIC) 10-12.5 MG tablet Take 1 tablet by mouth daily. 07/23/17   Wardell Honour, MD  metformin (FORTAMET) 500 MG (OSM) 24 hr tablet Take 1,000 mg by mouth 2 (two) times daily with a meal.     [provider]  Multiple Vitamin (MULTIVITAMIN) capsule Take 1 capsule by mouth 2 (two) times daily.     [provider]  Omega-3 1000 MG CAPS Take 3 g by mouth daily. Reported on 02/03/2015    [provider]  OVER THE COUNTER MEDICATION Take 1 packet by mouth 2 (two) times daily. Nutralite multi  Vitamins    [provider]  OVER THE COUNTER MEDICATION Take 1 tablet by mouth 2 (two) times daily. Glucose Health Cromium Piccolate    [provider]  predniSONE (STERAPRED UNI-PAK 21 TAB) 10 MG (21) TBPK tablet Take by mouth daily. 6 tbs PO qd x2 days, 5 tbs qd x2 days, 4 tabs qd x2 days, 3 tabs qd x2 days, 2 tabs qd x2 days, 1 tab qd x2 days 09/29/17   Zuleyka Kloc, PA-C  Probiotic Product (PROBIOTIC DAILY PO) Take 1 tablet by mouth daily. Reported on 02/03/2015     [provider]  rOPINIRole (REQUIP) 0.25 MG tablet TAKE 1 TABLET (0.25 MG TOTAL) BY MOUTH AT BEDTIME. 07/19/15   Wardell Honour, MD  spironolactone (ALDACTONE) 25 MG tablet Take 1 tablet (25 mg total) by mouth daily. 07/23/17   Wardell Honour, MD  tretinoin (RETIN-A) 0.025 % gel Apply topically at bedtime. 07/23/17   Wardell Honour, MD    Family History Family History  Problem Relation Age of Onset  . Heart disease Sister   . Diabetes type II Mother   . Coronary artery disease Mother   . Diabetes Mother   . Heart disease Mother   . Hyperlipidemia Mother   .  Hypertension Mother   . Diabetes type II Father   . Coronary artery disease Father   . Diabetes Father   . Heart disease Father   . Hyperlipidemia Father   . Diabetes Sister     Social History Social History   Tobacco Use  . Smoking status: Never Smoker  . Smokeless tobacco: Never Used  Substance Use Topics  . Alcohol use: Yes    Alcohol/week: 0.0 standard drinks    Comment: rare  . Drug use: No     Allergies   Antibacterial hand soap [triclosan]   Review of Systems Review of Systems  Constitutional: Negative for fever.  Skin: Positive for rash.  All other systems reviewed and are negative.    Physical Exam Updated Vital Signs BP (!) 141/76 (BP Location: Right Arm)   Pulse 92   Temp 98.4 F (36.9 C) (Oral)   Resp 18   Ht 5\' 5"  (1.651 m)   Wt 127.4 kg   LMP 01/17/2012   SpO2 97%   BMI 46.74 kg/m   Physical Exam  Constitutional: She is oriented to person, place, and time. She appears well-developed and well-nourished. No distress.  HENT:  Head: Normocephalic and atraumatic.  Eyes: EOM are normal.  Neck: Normal range of motion.  Cardiovascular: Normal rate, regular rhythm and intact distal pulses.  Pulmonary/Chest: Effort normal and breath sounds normal. No respiratory distress. She has no wheezes.  Abdominal: She exhibits no distension.  Musculoskeletal: Normal range of motion.    Neurological: She is alert and oriented to person, place, and time.  Skin: Skin is warm. Capillary refill takes less than 2 seconds. Rash noted.  See picture below. Macular rash. No vesicles, blisters, or bulla. No MM involvement. No dermatomal pattern  Psychiatric: She has a normal mood and affect.  Nursing note and vitals reviewed.      ED Treatments / Results  Labs (all labs ordered are listed, but only abnormal results are displayed) Labs Reviewed - No data to display  EKG None  Radiology No results found.  Procedures Procedures (including critical care time)  Medications Ordered in ED Medications  predniSONE (DELTASONE) tablet 60 mg (has no administration in time range)     Initial Impression / Assessment and Plan / ED Course  I have reviewed the triage vital signs and the nursing notes.  Pertinent labs & imaging results that were available during my care of the patient were reviewed by me and considered in my medical decision making (see chart for details).     Pt presenting for evaluation of rash.  Physical examination, no fevers or chills.  She appears nontoxic.  Rash is without blisters, bulla, or vesicles.  No dermatomal pattern.  Doubt herpes.  No mucous membrane involvement.  Doubt SJS, TEN. Pt with h/o DM, states bgl is well controlled. She has been on prednisone before and tolerated well. Rash consistent with contact dermatitis. Will try prednisone, discussed risks of increased BGL. Pt to f/u with PCP as needed. Antihistamines as needed for itching. At this time, pt appears safe for d/c. Return precautions given. Pt states she understands and agrees to plan.    Final Clinical Impressions(s) / ED Diagnoses   Final diagnoses:  Rash and nonspecific skin eruption    ED Discharge Orders         Ordered    predniSONE (STERAPRED UNI-PAK 21 TAB) 10 MG (21) TBPK tablet  Daily     09/29/17 1234  Franchot Heidelberg, PA-C 09/29/17 1241     Sherwood Gambler, MD 09/29/17 1426

## 2017-09-29 NOTE — ED Triage Notes (Deleted)
Pt fell on left wrist during volley ball tournament today. Swelling noted. Pt tearful. CMS intact

## 2017-09-29 NOTE — ED Notes (Signed)
ED Provider at bedside. 

## 2017-09-29 NOTE — ED Triage Notes (Signed)
Patient states that she feels like she may have been exposed to [place] that she started to have the rash throughout the night

## 2017-09-29 NOTE — ED Provider Notes (Signed)
[facility] EMERGENCY DEPARTMENT Provider Note   CSN: [id number] Arrival date & time: 09/29/17  1939     History   Chief Complaint Chief Complaint  Patient presents with  . Rash  . Hyperglycemia    HPI Kelli Allen is a 42 y.o. female .  Patient is a 42 year old female with a history of diabetes, Cushing syndrome, PCOS and hypertension presents with a rash.  She was seen earlier today for a rash to her left flank and shoulder.  It started yesterday.  She states it started itchy but now it is a burning type pain.  She denies any systemic itching.  No swelling of her lips or tongue.  No shortness of breath or wheezing.  No fever.  No nausea or vomiting.  She was seen earlier today and the rash seemed to be consistent with an allergic type rash/contact dermatitis.  She was started on steroids.  She states since that time she has noted some blistering and is concerned that she may have shingles.  She states her blood sugars also been markedly elevated in the 400 range.  She did give herself an extra shot of insulin prior to leaving the house to come here.  She denies any significant increased thirst.  No vomiting.  No polyuria.     Past Medical History:  Diagnosis Date  . Abnormal Pap smear 2000  . Anxiety   . Asthma    no issues in years  . Cellulitis of lower leg    x3 on right leg  . Cushing syndrome ([place]) 11/26/2013   s/p transsphenoidal hypophysectomy for ACTH secreting pituitary lesion  . Cushing's syndrome ([place]) 09/25/2013   MRI done on 10/02/2013  . Diabetes mellitus   . Endometrial ca ([place]) 12/20/2011  . Family history of anesthesia complication    sister had hard time waking up and problems breathing  . Hypertension   . Irregular menstrual bleeding   . Neuromuscular disorder ([place])    DIABETIC NEUROPATHY  . PCOS (polycystic ovarian syndrome)   . Tachycardia     Patient Active Problem List   Diagnosis Date Noted  . OSA on CPAP 06/04/2017  .  Generalized anxiety disorder 04/10/2017  . Osteopenia 04/24/2014  . Hyponatremia   . Type 2 diabetes mellitus with diabetic polyneuropathy ([place]) 01/13/2014  . Sinus tachycardia by electrocardiogram 01/01/2014  . Disease of pituitary gland ([place]) 11/25/2013  . Cushing's disease ([place]) 10/15/2013  . History of endometrial cancer 07/03/2012  . Elevated cholesterol with high triglycerides 03/02/2011  . HTN (hypertension) 02/26/2011  . POLYCYSTIC OVARY 03/08/2006  . Asthma 03/08/2006  . Deficiency, disaccharidase intestinal 03/08/2006    Past Surgical History:  Procedure Laterality Date  . ABDOMINAL HYSTERECTOMY    . DILATION AND CURETTAGE OF UTERUS    . HYSTEROSCOPY W/D&C  01/24/2012   Procedure: DILATATION AND CURETTAGE /HYSTEROSCOPY;  Surgeon: Betsy Coder, MD;  Location: [facility];  Service: Gynecology;  Laterality: N/A;  with vulvar biopsies   . INTRAUTERINE DEVICE (IUD) INSERTION  01/24/2012   Procedure: INTRAUTERINE DEVICE (IUD) INSERTION;  Surgeon: Betsy Coder, MD;  Location: [facility];  Service: Gynecology;  Laterality: N/A;  . ROBOTIC ASSISTED TOTAL HYSTERECTOMY WITH BILATERAL SALPINGO OOPHERECTOMY Bilateral 07/02/2012   Procedure: ROBOTIC ASSISTED TOTAL HYSTERECTOMY WITH BILATERAL SALPINGO OOPHORECTOMY ;  Surgeon: Imagene Gurney A. Alycia Rossetti, MD;  Location: [facility];  Service: Gynecology;  Laterality: Bilateral;  . transsphenoid hypophysectomy  11/26/2013   ACTH secreting pituitary lesion causing Cushing's; [facility];  Angelene Giovanni, MD     OB History    Gravida  0   Para      Term      Preterm      AB      Living        SAB      TAB      Ectopic      Multiple      Live Births               Home Medications    Prior to Admission medications   Medication Sig Start Date End Date Taking? Authorizing Provider  albuterol (PROAIR HFA) 108 (90 BASE) MCG/ACT inhaler Inhale 2 puffs into the lungs every 6 (six) hours as needed for shortness of breath. 05/14/14   Wardell Honour, MD  atorvastatin (LIPITOR) 20 MG tablet Take 1 tablet (20 mg total) by mouth daily at 6 PM. TAKE ONE TABLET BY MOUTH ONE TIME DAILY AT 6 PM. 07/23/17   Wardell Honour, MD  BAYER CONTOUR TEST test strip Patient uses one touch strips 12/12/13   [provider]  Dulaglutide 1.5 MG/0.5ML SOPN Inject into the skin. 04/30/17   [provider]  Ergocalciferol (VITAMIN D2) 2000 UNITS TABS Take 1 tablet by mouth daily.     [provider]  FLUoxetine (PROZAC) 20 MG tablet Take 3 tablets (60 mg total) by mouth daily. 07/04/17   Wardell Honour, MD  gabapentin (NEURONTIN) 300 MG capsule TAKE 1-2 CAPSULES (300-600 MG TOTAL) BY MOUTH 3 (THREE) TIMES DAILY. 07/04/17   Wardell Honour, MD  insulin aspart (NOVOLOG FLEXPEN) 100 UNIT/ML FlexPen Inject 22 units with a correction 1:50 > 150 three times daily before meals 10/10/13   [provider]  Insulin Glargine 300 UNIT/ML SOPN Inject into the skin. 04/30/17   [provider]  ipratropium (ATROVENT) 0.03 % nasal spray Place 2 sprays into both nostrils 2 (two) times daily. 02/02/17   Ivar Drape D, PA  labetalol (NORMODYNE) 200 MG tablet TAKE 1 TABLET BY MOUTH 2 (TWO) TIMES DAILY. 12/31/14   Wardell Honour, MD  lisinopril-hydrochlorothiazide (PRINZIDE,ZESTORETIC) 10-12.5 MG tablet Take 1 tablet by mouth daily. 07/23/17   Wardell Honour, MD  metformin (FORTAMET) 500 MG (OSM) 24 hr tablet Take 1,000 mg by mouth 2 (two) times daily with a meal.     [provider]  Multiple Vitamin (MULTIVITAMIN) capsule Take 1 capsule by mouth 2 (two) times daily.     [provider]  Omega-3 1000 MG CAPS Take 3 g by mouth daily. Reported on 02/03/2015    [provider]  OVER THE COUNTER MEDICATION Take 1 packet by mouth 2 (two) times daily. Nutralite multi  Vitamins    [provider]  OVER THE COUNTER MEDICATION Take 1 tablet by mouth 2 (two) times daily. Glucose Health Cromium Piccolate     [provider]  predniSONE (STERAPRED UNI-PAK 21 TAB) 10 MG (21) TBPK tablet Take by mouth daily. 6 tbs PO qd x2 days, 5 tbs qd x2 days, 4 tabs qd x2 days, 3 tabs qd x2 days, 2 tabs qd x2 days, 1 tab qd x2 days 09/29/17   Caccavale, Sophia, PA-C  Probiotic Product (PROBIOTIC DAILY PO) Take 1 tablet by mouth daily. Reported on 02/03/2015    [provider]  rOPINIRole (REQUIP) 0.25 MG tablet TAKE 1 TABLET (0.25 MG TOTAL) BY MOUTH AT BEDTIME. 07/19/15   Wardell Honour, MD  spironolactone (ALDACTONE) 25 MG tablet Take 1 tablet (25 mg total) by mouth daily. 07/23/17   Wardell Honour, MD  tretinoin (RETIN-A) 0.025 % gel Apply topically at bedtime. 07/23/17   Wardell Honour, MD    Family History Family History  Problem Relation Age of Onset  . Heart disease Sister   . Diabetes type II Mother   . Coronary artery disease Mother   . Diabetes Mother   . Heart disease Mother   . Hyperlipidemia Mother   . Hypertension Mother   . Diabetes type II Father   . Coronary artery disease Father   . Diabetes Father   . Heart disease Father   . Hyperlipidemia Father   . Diabetes Sister     Social History Social History   Tobacco Use  . Smoking status: Never Smoker  . Smokeless tobacco: Never Used  Substance Use Topics  . Alcohol use: Yes    Alcohol/week: 0.0 standard drinks    Comment: rare  . Drug use: No     Allergies   Antibacterial hand soap [triclosan]   Review of Systems Review of Systems  Constitutional: Negative for chills, diaphoresis, fatigue and fever.  HENT: Negative for congestion, rhinorrhea and sneezing.   Eyes: Negative.   Respiratory: Negative for cough, chest tightness and shortness of breath.   Cardiovascular: Negative for chest pain and leg swelling.  Gastrointestinal: Negative for abdominal pain, blood in stool, diarrhea, nausea and vomiting.  Genitourinary: Negative for difficulty urinating, flank pain, frequency and hematuria.  Musculoskeletal:  Negative for arthralgias and back pain.  Skin: Positive for rash.  Neurological: Negative for dizziness, speech difficulty, weakness, numbness and headaches.     Physical Exam Updated Vital Signs BP 132/65   Pulse (!) 111   Temp 98.3 F (36.8 C) (Oral)   Resp 20   LMP 01/17/2012   SpO2 97%   Physical Exam  Constitutional: She is oriented to person, place, and time. She appears well-developed and well-nourished.  HENT:  Head: Normocephalic and atraumatic.  Eyes: Pupils are equal, round, and reactive to light.  Neck: Normal range of motion. Neck supple.  Cardiovascular: Normal rate, regular rhythm and normal heart sounds.  Pulmonary/Chest: Effort normal and breath sounds normal. No respiratory distress. She has no wheezes. She has no rales. She exhibits no tenderness.  Abdominal: Soft. Bowel sounds are normal. There is no tenderness. There is no rebound and no guarding.  Musculoskeletal: Normal range of motion. She exhibits no edema.  Lymphadenopathy:    She has no cervical adenopathy.  Neurological: She is alert and oriented to person, place, and time.  Skin: Skin is warm and dry. Rash noted.  Patient has a diffuse raised erythematous rash to the left flank and shoulder.  There is some small patches on her left arm.  There is some small pustules in the area and also areas that appear to be fine vesicles.  Psychiatric: She has a normal mood and affect.         ED Treatments / Results  Labs (all labs ordered are listed, but only abnormal results are displayed) Labs Reviewed - No data to display  EKG None  Radiology No results found.  Procedures Procedures (including critical care time)  Medications Ordered in ED Medications - No data to display   Initial Impression / Assessment and Plan / ED Course  I have reviewed the triage vital signs and the nursing notes.  Pertinent labs & imaging results that were available  during my care of the patient were reviewed by  me and considered in my medical decision making (see chart for details).     Patient is a 42 year old female who presents with a rash that started yesterday.  Initially was felt to be an allergic type reaction and she was given a dose of steroids.  Now there is some blistering which is concerning for shingles.  Herpes zoster PCR was sent and is pending.  I will go ahead and start her on Valtrex.  Her blood sugar has improved.  She has no clinical suggestions of DKA.  She states she feels fine and is ready to go home.  She is mildly tachycardic with a heart rate around 100.  She states that this is not abnormal for her and recently had stopped labetalol.  She also took a dose of her inhaler at home and feels like it is partly related to that.  She does not want to stay for any further evaluation.  She was discharged home in good condition.  She was given prescription for Valtrex.  She was encouraged to have close follow-up with her PCP.  Return precautions were given.  He does know to keep a close eye on her blood sugars at home and she has sliding scale insulin to use.  Final Clinical Impressions(s) / ED Diagnoses   Final diagnoses:  None    ED Discharge Orders    None       Malvin Johns, MD 09/29/17 2229

## 2017-09-29 NOTE — ED Triage Notes (Signed)
Pt was here earlier for eval of rash; received prednisone; now sts rash is worse and she thinks it's shingles; glucose was 423 after taking of regular insulin; was referred here from triage nurse line

## 2017-09-29 NOTE — Discharge Instructions (Signed)
Take antihistamines such as benadryl or zyrtec for itching.  Take prednisone as prescribed. Monitor your blood sugar closely, as prednisone may cause your glucose to rise. Stop if blood sugars are not well controlled.  Follow up with your primary care doctor as needed.  Return to the ER with any new, worsening, or concerning symptoms.

## 2017-10-02 LAB — VARICELLA-ZOSTER BY PCR: Varicella-Zoster, PCR: NEGATIVE

## 2017-12-16 ENCOUNTER — Emergency Department (HOSPITAL_BASED_OUTPATIENT_CLINIC_OR_DEPARTMENT_OTHER): Payer: BLUE CROSS/BLUE SHIELD

## 2017-12-16 ENCOUNTER — Encounter (HOSPITAL_BASED_OUTPATIENT_CLINIC_OR_DEPARTMENT_OTHER): Payer: Self-pay | Admitting: Emergency Medicine

## 2017-12-16 ENCOUNTER — Other Ambulatory Visit: Payer: Self-pay

## 2017-12-16 ENCOUNTER — Emergency Department (HOSPITAL_BASED_OUTPATIENT_CLINIC_OR_DEPARTMENT_OTHER)
Admission: EM | Admit: 2017-12-16 | Discharge: 2017-12-16 | Disposition: A | Discharge status: Home / Self Care | Payer: BLUE CROSS/BLUE SHIELD | Attending: Emergency Medicine | Admitting: Emergency Medicine

## 2017-12-16 DIAGNOSIS — Z794 Long term (current) use of insulin: Secondary | ICD-10-CM | POA: Insufficient documentation

## 2017-12-16 DIAGNOSIS — I10 Essential (primary) hypertension: Secondary | ICD-10-CM | POA: Diagnosis not present

## 2017-12-16 DIAGNOSIS — Y998 Other external cause status: Secondary | ICD-10-CM | POA: Insufficient documentation

## 2017-12-16 DIAGNOSIS — Z79899 Other long term (current) drug therapy: Secondary | ICD-10-CM | POA: Diagnosis not present

## 2017-12-16 DIAGNOSIS — W260XXA Contact with knife, initial encounter: Secondary | ICD-10-CM | POA: Diagnosis not present

## 2017-12-16 DIAGNOSIS — J45909 Unspecified asthma, uncomplicated: Secondary | ICD-10-CM | POA: Diagnosis not present

## 2017-12-16 DIAGNOSIS — Y92000 Kitchen of unspecified non-institutional (private) residence as  the place of occurrence of the external cause: Secondary | ICD-10-CM | POA: Diagnosis not present

## 2017-12-16 DIAGNOSIS — Z8542 Personal history of malignant neoplasm of other parts of uterus: Secondary | ICD-10-CM | POA: Diagnosis not present

## 2017-12-16 DIAGNOSIS — F419 Anxiety disorder, unspecified: Secondary | ICD-10-CM | POA: Diagnosis not present

## 2017-12-16 DIAGNOSIS — E119 Type 2 diabetes mellitus without complications: Secondary | ICD-10-CM | POA: Insufficient documentation

## 2017-12-16 DIAGNOSIS — S61011A Laceration without foreign body of right thumb without damage to nail, initial encounter: Secondary | ICD-10-CM | POA: Diagnosis present

## 2017-12-16 DIAGNOSIS — Y93G3 Activity, cooking and baking: Secondary | ICD-10-CM | POA: Diagnosis not present

## 2017-12-16 MED ORDER — CEPHALEXIN 500 MG PO CAPS: 500.0000 mg | ORAL_CAPSULE | Freq: Four times a day (QID) | ORAL | 0 refills | Status: AC

## 2017-12-16 NOTE — ED Notes (Signed)
Patient left at this time with all belongings. 

## 2017-12-16 NOTE — ED Notes (Signed)
Laceration to right thenar area of hand with swollen thumb

## 2017-12-16 NOTE — ED Triage Notes (Signed)
Pt states she got a laceration on her right thumb while cooking, pt has a band aid with bleeding controlled.

## 2017-12-16 NOTE — Discharge Instructions (Signed)
Apply antibiotic ointment once daily.  Wash with warm soapy water.  Take Keflex until completed.  Try to limit movement, but you can use your finger.  Use ice 3-4 times daily alternating 20 minutes on, 20 minutes off.  Please follow-up with Dr. Mardelle Matte or your primary care provider if your symptoms are not improving.  If you develop any increasing pain, redness, swelling, drainage, red streaking from the wound, fevers, please go to your nearest emergency department or see your doctor immediately.

## 2017-12-17 NOTE — ED Provider Notes (Addendum)
[facility] EMERGENCY DEPARTMENT Provider Note   CSN: [id number] Arrival date & time: 12/16/17  1532     History   Chief Complaint Chief Complaint  Patient presents with  . Laceration    HPI Kelli Allen is a 42 y.o. female with history of diabetes who presents with laceration to right thumb that occurred yesterday while patient was cutting potatoes.  She reports the point into the knife went directly into the palmar aspect of her thumb between the IP and MCP joint.  Patient reports it did not bleed very much.  She put a Band-Aid on it and kept cooking and working with her hands all day.  Now she has some pain and swelling to the thumb.  She denies any fevers.  She has full range of motion of the thumb.  Tetanus is up-to-date. Patient is left handed.  HPI  Past Medical History:  Diagnosis Date  . Abnormal Pap smear 2000  . Anxiety   . Asthma    no issues in years  . Cellulitis of lower leg    x3 on right leg  . Cushing syndrome ([place]) 11/26/2013   s/p transsphenoidal hypophysectomy for ACTH secreting pituitary lesion  . Cushing's syndrome ([place]) 09/25/2013   MRI done on 10/02/2013  . Diabetes mellitus   . Endometrial ca ([place]) 12/20/2011  . Family history of anesthesia complication    sister had hard time waking up and problems breathing  . Hypertension   . Irregular menstrual bleeding   . Neuromuscular disorder ([place])    DIABETIC NEUROPATHY  . PCOS (polycystic ovarian syndrome)   . Tachycardia     Patient Active Problem List   Diagnosis Date Noted  . OSA on CPAP 06/04/2017  . Generalized anxiety disorder 04/10/2017  . Osteopenia 04/24/2014  . Hyponatremia   . Type 2 diabetes mellitus with diabetic polyneuropathy ([place]) 01/13/2014  . Sinus tachycardia by electrocardiogram 01/01/2014  . Disease of pituitary gland ([place]) 11/25/2013  . Cushing's disease ([place]) 10/15/2013  . History of endometrial cancer 07/03/2012  . Elevated cholesterol with high  triglycerides 03/02/2011  . HTN (hypertension) 02/26/2011  . POLYCYSTIC OVARY 03/08/2006  . Asthma 03/08/2006  . Deficiency, disaccharidase intestinal 03/08/2006    Past Surgical History:  Procedure Laterality Date  . ABDOMINAL HYSTERECTOMY    . DILATION AND CURETTAGE OF UTERUS    . HYSTEROSCOPY W/D&C  01/24/2012   Procedure: DILATATION AND CURETTAGE /HYSTEROSCOPY;  Surgeon: Betsy Coder, MD;  Location: [facility];  Service: Gynecology;  Laterality: N/A;  with vulvar biopsies   . INTRAUTERINE DEVICE (IUD) INSERTION  01/24/2012   Procedure: INTRAUTERINE DEVICE (IUD) INSERTION;  Surgeon: Betsy Coder, MD;  Location: [facility];  Service: Gynecology;  Laterality: N/A;  . ROBOTIC ASSISTED TOTAL HYSTERECTOMY WITH BILATERAL SALPINGO OOPHERECTOMY Bilateral 07/02/2012   Procedure: ROBOTIC ASSISTED TOTAL HYSTERECTOMY WITH BILATERAL SALPINGO OOPHORECTOMY ;  Surgeon: Imagene Gurney A. Alycia Rossetti, MD;  Location: [facility];  Service: Gynecology;  Laterality: Bilateral;  . transsphenoid hypophysectomy  11/26/2013   ACTH secreting pituitary lesion causing Cushing's; [facility]; Angelene Giovanni, MD     OB History    Gravida  0   Para      Term      Preterm      AB      Living        SAB      TAB      Ectopic      Multiple      Live  Births               Home Medications    Prior to Admission medications   Medication Sig Start Date End Date Taking? Authorizing Provider  albuterol (PROAIR HFA) 108 (90 BASE) MCG/ACT inhaler Inhale 2 puffs into the lungs every 6 (six) hours as needed for shortness of breath. 05/14/14   Wardell Honour, MD  atorvastatin (LIPITOR) 20 MG tablet Take 1 tablet (20 mg total) by mouth daily at 6 PM. TAKE ONE TABLET BY MOUTH ONE TIME DAILY AT 6 PM. 07/23/17   Wardell Honour, MD  BAYER CONTOUR TEST test strip Patient uses one touch strips 12/12/13   [provider]  cephALEXin (KEFLEX) 500 MG capsule Take 1 capsule (500 mg total) by mouth 4 (four) times daily. 12/16/17    Amariss Detamore, Bea Graff, PA-C  Dulaglutide 1.5 MG/0.5ML SOPN Inject into the skin. 04/30/17   [provider]  Ergocalciferol (VITAMIN D2) 2000 UNITS TABS Take 1 tablet by mouth daily.     [provider]  FLUoxetine (PROZAC) 20 MG tablet Take 3 tablets (60 mg total) by mouth daily. 07/04/17   Wardell Honour, MD  gabapentin (NEURONTIN) 300 MG capsule TAKE 1-2 CAPSULES (300-600 MG TOTAL) BY MOUTH 3 (THREE) TIMES DAILY. 07/04/17   Wardell Honour, MD  insulin aspart (NOVOLOG FLEXPEN) 100 UNIT/ML FlexPen Inject 22 units with a correction 1:50 > 150 three times daily before meals 10/10/13   [provider]  Insulin Glargine 300 UNIT/ML SOPN Inject into the skin. 04/30/17   [provider]  ipratropium (ATROVENT) 0.03 % nasal spray Place 2 sprays into both nostrils 2 (two) times daily. 02/02/17   Ivar Drape D, PA  labetalol (NORMODYNE) 200 MG tablet TAKE 1 TABLET BY MOUTH 2 (TWO) TIMES DAILY. 12/31/14   Wardell Honour, MD  lisinopril-hydrochlorothiazide (PRINZIDE,ZESTORETIC) 10-12.5 MG tablet Take 1 tablet by mouth daily. 07/23/17   Wardell Honour, MD  metformin (FORTAMET) 500 MG (OSM) 24 hr tablet Take 1,000 mg by mouth 2 (two) times daily with a meal.     [provider]  Multiple Vitamin (MULTIVITAMIN) capsule Take 1 capsule by mouth 2 (two) times daily.     [provider]  Omega-3 1000 MG CAPS Take 3 g by mouth daily. Reported on 02/03/2015    [provider]  OVER THE COUNTER MEDICATION Take 1 packet by mouth 2 (two) times daily. Nutralite multi  Vitamins    [provider]  OVER THE COUNTER MEDICATION Take 1 tablet by mouth 2 (two) times daily. Glucose Health Cromium Piccolate    [provider]  predniSONE (STERAPRED UNI-PAK 21 TAB) 10 MG (21) TBPK tablet Take by mouth daily. 6 tbs PO qd x2 days, 5 tbs qd x2 days, 4 tabs qd x2 days, 3 tabs qd x2 days, 2 tabs qd x2 days, 1 tab qd x2 days 09/29/17   Caccavale, Sophia, PA-C    Probiotic Product (PROBIOTIC DAILY PO) Take 1 tablet by mouth daily. Reported on 02/03/2015    [provider]  rOPINIRole (REQUIP) 0.25 MG tablet TAKE 1 TABLET (0.25 MG TOTAL) BY MOUTH AT BEDTIME. 07/19/15   Wardell Honour, MD  spironolactone (ALDACTONE) 25 MG tablet Take 1 tablet (25 mg total) by mouth daily. 07/23/17   Wardell Honour, MD  tretinoin (RETIN-A) 0.025 % gel Apply topically at bedtime. 07/23/17   Wardell Honour, MD  valACYclovir (VALTREX) 1000 MG tablet Take 1 tablet (  1,000 mg total) by mouth 3 (three) times daily. 09/29/17   Malvin Johns, MD    Family History Family History  Problem Relation Age of Onset  . Heart disease Sister   . Diabetes type II Mother   . Coronary artery disease Mother   . Diabetes Mother   . Heart disease Mother   . Hyperlipidemia Mother   . Hypertension Mother   . Diabetes type II Father   . Coronary artery disease Father   . Diabetes Father   . Heart disease Father   . Hyperlipidemia Father   . Diabetes Sister     Social History Social History   Tobacco Use  . Smoking status: Never Smoker  . Smokeless tobacco: Never Used  Substance Use Topics  . Alcohol use: Yes    Alcohol/week: 0.0 standard drinks    Comment: rare  . Drug use: No     Allergies   Antibacterial hand soap [triclosan]   Review of Systems Review of Systems  Musculoskeletal: Positive for joint swelling.  Skin: Positive for wound.  Neurological: Negative for numbness.     Physical Exam Updated Vital Signs BP (!) 142/69 (BP Location: Right Arm)   Pulse (!) 110   Temp 98 F (36.7 C) (Oral)   Resp 18   Ht 5\' 5"  (1.651 m)   Wt 128.4 kg   LMP 01/17/2012   SpO2 99%   BMI 47.09 kg/m   Physical Exam  Constitutional: She appears well-developed and well-nourished. No distress.  HENT:  Head: Normocephalic and atraumatic.  Mouth/Throat: Oropharynx is clear and moist. No oropharyngeal exudate.  Eyes: Pupils are equal, round, and reactive to light.  Conjunctivae are normal. Right eye exhibits no discharge. Left eye exhibits no discharge. No scleral icterus.  Neck: Normal range of motion. Neck supple. No thyromegaly present.  Cardiovascular: Regular rhythm, normal heart sounds and intact distal pulses. Exam reveals no gallop and no friction rub.  No murmur heard. Pulmonary/Chest: Effort normal and breath sounds normal. No stridor. No respiratory distress. She has no wheezes. She has no rales.  Musculoskeletal: She exhibits no edema.  Small, less than 1 cm, superficial laceration to right thumb between the IP and the MCP, no active bleeding, scant serous draining, no purulent drainage, no erythema; mild edema noted to the thumb, some tenderness just proximal to the wound; full range of motion with abduction, abduction, flexion, extension of the digit, sensation intact, cap refill less than 2 seconds  Lymphadenopathy:    She has no cervical adenopathy.  Neurological: She is alert. Coordination normal.  Skin: Skin is warm and dry. No rash noted. She is not diaphoretic. No pallor.  Psychiatric: She has a normal mood and affect.  Nursing note and vitals reviewed.    ED Treatments / Results  Labs (all labs ordered are listed, but only abnormal results are displayed) Labs Reviewed - No data to display  EKG None  Radiology Dg Finger Thumb Right  Result Date: 12/16/2017 CLINICAL DATA:  Laceration to base of thumb. EXAM: RIGHT THUMB 2+V COMPARISON:  None. FINDINGS: No bony abnormality evident. No radiopaque soft tissue foreign body. IMPRESSION: Negative. Electronically Signed   By: Misty Stanley M.D.   On: 12/16/2017 18:37    Procedures Procedures (including critical care time)  Medications Ordered in ED Medications - No data to display   Initial Impression / Assessment and Plan / ED Course  I have reviewed the triage vital signs and the nursing notes.  Pertinent labs &  imaging results that were available during my care of the  patient were reviewed by me and considered in my medical decision making (see chart for details).     Patients with laceration.  This occurred yesterday.  It is well-appearing and no signs of infection except for edema.  No indication for repair, however considering edema and patient is diabetic, will cover with Keflex.  X-ray is negative.  No evidence of complete tendon laceration considering full range of motion of the joint, however patient patient given follow-up to hand surgery if the symptoms are continuing.  Return precautions discussed.  Patient understands and agrees with plan.  Patient vitals stable here ED course and discharged in satisfactory condition.  Final Clinical Impressions(s) / ED Diagnoses   Final diagnoses:  Laceration of right thumb without foreign body without damage to nail, initial encounter    ED Discharge Orders         Ordered    cephALEXin (KEFLEX) 500 MG capsule  4 times daily     12/16/17 1855            Frederica Kuster, PA-C 12/17/17 1046    Virgel Manifold, MD 12/20/17 [phone]

## 2018-02-27 ENCOUNTER — Telehealth: Payer: Self-pay

## 2018-02-27 NOTE — Telephone Encounter (Signed)
I called pt about medication refill- pt states that her PCP is Dr. Nolon Rod and pt will call pharmacy to have it fixed

## 2019-02-07 ENCOUNTER — Other Ambulatory Visit: Payer: Self-pay

## 2019-02-07 ENCOUNTER — Emergency Department
Admission: EM | Admit: 2019-02-07 | Discharge: 2019-02-07 | Disposition: A | Discharge status: Home / Self Care | Payer: BLUE CROSS/BLUE SHIELD | Source: Home / Self Care | Attending: Family Medicine | Admitting: Family Medicine

## 2019-02-07 DIAGNOSIS — L02412 Cutaneous abscess of left axilla: Secondary | ICD-10-CM

## 2019-02-07 DIAGNOSIS — L03319 Cellulitis of trunk, unspecified: Secondary | ICD-10-CM

## 2019-02-07 DIAGNOSIS — L02219 Cutaneous abscess of trunk, unspecified: Secondary | ICD-10-CM

## 2019-02-07 MED ORDER — DOXYCYCLINE HYCLATE 100 MG PO CAPS: 100.0000 mg | ORAL_CAPSULE | Freq: Two times a day (BID) | ORAL | 0 refills | Status: AC

## 2019-02-07 NOTE — ED Triage Notes (Signed)
Pt has an abscess under the left arm, and on the right side, rib area. Arm is swollen red, and painful Rib popped a few days ago, yellow/green discharge

## 2019-02-07 NOTE — Discharge Instructions (Addendum)
Change dressing daily and apply Bacitracin ointment to wound.  Keep wound clean and dry.  Return for any signs of infection (or follow-up with family doctor):  Increasing redness, swelling, pain, heat, drainage, etc.

## 2019-02-07 NOTE — ED Provider Notes (Signed)
Kelli [facility] CARE    CSN: [id number] Arrival date & time: 02/07/19  1100      History   Chief Complaint Chief Complaint  Patient presents with  . Abscess    HPI Kelli Allen is a 44 y.o. female.   Patient complains of a painful abscess in her left axilla for about 6 days.  Prior to that she had had a small lesion on her right chest that drained and is no longer painful  The history is provided by the patient.  Abscess Abscess location: left axilla. Size:  1cm by 1.5cm Abscess quality: fluctuance, painful and redness   Abscess quality: not draining and not weeping   Red streaking: no   Duration:  6 days Progression:  Worsening Pain details:    Quality:  Aching and pressure   Severity:  Moderate   Duration:  6 days   Timing:  Constant   Progression:  Worsening Chronicity:  Recurrent Context: not insect bite/sting and not skin injury   Relieved by:  None tried Worsened by:  Draining/squeezing Ineffective treatments:  Draining/squeezing Associated symptoms: no fatigue and no fever   Risk factors: prior abscess     Past Medical History:  Diagnosis Date  . Abnormal Pap smear 2000  . Anxiety   . Asthma    no issues in years  . Cellulitis of lower leg    x3 on right leg  . Cushing syndrome ([place]) 11/26/2013   s/p transsphenoidal hypophysectomy for ACTH secreting pituitary lesion  . Cushing's syndrome ([place]) 09/25/2013   MRI done on 10/02/2013  . Diabetes mellitus   . Endometrial ca ([place]) 12/20/2011  . Family history of anesthesia complication    sister had hard time waking up and problems breathing  . Hypertension   . Irregular menstrual bleeding   . Neuromuscular disorder ([place])    DIABETIC NEUROPATHY  . PCOS (polycystic ovarian syndrome)   . Tachycardia     Patient Active Problem List   Diagnosis Date Noted  . OSA on CPAP 06/04/2017  . Generalized anxiety disorder 04/10/2017  . Osteopenia 04/24/2014  . Hyponatremia   . Type 2 diabetes mellitus  with diabetic polyneuropathy ([place]) 01/13/2014  . Sinus tachycardia by electrocardiogram 01/01/2014  . Disease of pituitary gland ([place]) 11/25/2013  . Cushing's disease ([place]) 10/15/2013  . History of endometrial cancer 07/03/2012  . Elevated cholesterol with high triglycerides 03/02/2011  . HTN (hypertension) 02/26/2011  . POLYCYSTIC OVARY 03/08/2006  . Asthma 03/08/2006  . Deficiency, disaccharidase intestinal 03/08/2006    Past Surgical History:  Procedure Laterality Date  . ABDOMINAL HYSTERECTOMY    . DILATION AND CURETTAGE OF UTERUS    . HYSTEROSCOPY WITH D & C  01/24/2012   Procedure: DILATATION AND CURETTAGE /HYSTEROSCOPY;  Surgeon: Betsy Coder, MD;  Location: [facility];  Service: Gynecology;  Laterality: N/A;  with vulvar biopsies   . INTRAUTERINE DEVICE (IUD) INSERTION  01/24/2012   Procedure: INTRAUTERINE DEVICE (IUD) INSERTION;  Surgeon: Betsy Coder, MD;  Location: [facility];  Service: Gynecology;  Laterality: N/A;  . ROBOTIC ASSISTED TOTAL HYSTERECTOMY WITH BILATERAL SALPINGO OOPHERECTOMY Bilateral 07/02/2012   Procedure: ROBOTIC ASSISTED TOTAL HYSTERECTOMY WITH BILATERAL SALPINGO OOPHORECTOMY ;  Surgeon: Imagene Gurney A. Alycia Rossetti, MD;  Location: [facility];  Service: Gynecology;  Laterality: Bilateral;  . transsphenoid hypophysectomy  11/26/2013   ACTH secreting pituitary lesion causing Cushing's; [facility]; Angelene Giovanni, MD    OB History    Gravida  0   Para  Term      Preterm      AB      Living        SAB      TAB      Ectopic      Multiple      Live Births               Home Medications    Prior to Admission medications   Medication Sig Start Date End Date Taking? Authorizing Provider  albuterol (PROAIR HFA) 108 (90 BASE) MCG/ACT inhaler Inhale 2 puffs into the lungs every 6 (six) hours as needed for shortness of breath. 05/14/14   Wardell Honour, MD  atorvastatin (LIPITOR) 20 MG tablet Take 1 tablet (20 mg total) by mouth daily at 6 PM. TAKE ONE  TABLET BY MOUTH ONE TIME DAILY AT 6 PM. 07/23/17   Wardell Honour, MD  BAYER CONTOUR TEST test strip Patient uses one touch strips 12/12/13   [provider]  cephALEXin (KEFLEX) 500 MG capsule Take 1 capsule (500 mg total) by mouth 4 (four) times daily. 12/16/17   Law, Bea Graff, PA-C  doxycycline (VIBRAMYCIN) 100 MG capsule Take 1 capsule (100 mg total) by mouth 2 (two) times daily. Take with food. 02/07/19   Kandra Nicolas, MD  Dulaglutide 1.5 MG/0.5ML SOPN Inject into the skin. 04/30/17   [provider]  Ergocalciferol (VITAMIN D2) 2000 UNITS TABS Take 1 tablet by mouth daily.     [provider]  FLUoxetine (PROZAC) 20 MG tablet Take 3 tablets (60 mg total) by mouth daily. 07/04/17   Wardell Honour, MD  gabapentin (NEURONTIN) 300 MG capsule TAKE 1-2 CAPSULES (300-600 MG TOTAL) BY MOUTH 3 (THREE) TIMES DAILY. 07/04/17   Wardell Honour, MD  insulin aspart (NOVOLOG FLEXPEN) 100 UNIT/ML FlexPen Inject 22 units with a correction 1:50 > 150 three times daily before meals 10/10/13   [provider]  Insulin Glargine 300 UNIT/ML SOPN Inject into the skin. 04/30/17   [provider]  ipratropium (ATROVENT) 0.03 % nasal spray Place 2 sprays into both nostrils 2 (two) times daily. 02/02/17   Ivar Drape D, PA  labetalol (NORMODYNE) 200 MG tablet TAKE 1 TABLET BY MOUTH 2 (TWO) TIMES DAILY. 12/31/14   Wardell Honour, MD  lisinopril-hydrochlorothiazide (PRINZIDE,ZESTORETIC) 10-12.5 MG tablet Take 1 tablet by mouth daily. 07/23/17   Wardell Honour, MD  metformin (FORTAMET) 500 MG (OSM) 24 hr tablet Take 1,000 mg by mouth 2 (two) times daily with a meal.     [provider]  Multiple Vitamin (MULTIVITAMIN) capsule Take 1 capsule by mouth 2 (two) times daily.     [provider]  Omega-3 1000 MG CAPS Take 3 g by mouth daily. Reported on 02/03/2015    [provider]  OVER THE COUNTER MEDICATION Take 1 packet by mouth 2 (two) times  daily. Nutralite multi  Vitamins    [provider]  OVER THE COUNTER MEDICATION Take 1 tablet by mouth 2 (two) times daily. Glucose Health Cromium Piccolate    [provider]  predniSONE (STERAPRED UNI-PAK 21 TAB) 10 MG (21) TBPK tablet Take by mouth daily. 6 tbs PO qd x2 days, 5 tbs qd x2 days, 4 tabs qd x2 days, 3 tabs qd x2 days, 2 tabs qd x2 days, 1 tab qd x2 days 09/29/17   Caccavale, Sophia, PA-C  Probiotic Product (PROBIOTIC DAILY PO) Take 1 tablet by mouth daily. Reported on  02/03/2015    [provider]  rOPINIRole (REQUIP) 0.25 MG tablet TAKE 1 TABLET (0.25 MG TOTAL) BY MOUTH AT BEDTIME. 07/19/15   Wardell Honour, MD  spironolactone (ALDACTONE) 25 MG tablet Take 1 tablet (25 mg total) by mouth daily. 07/23/17   Wardell Honour, MD  tretinoin (RETIN-A) 0.025 % gel Apply topically at bedtime. 07/23/17   Wardell Honour, MD  valACYclovir (VALTREX) 1000 MG tablet Take 1 tablet (1,000 mg total) by mouth 3 (three) times daily. 09/29/17   Malvin Johns, MD    Family History Family History  Problem Relation Age of Onset  . Heart disease Sister   . Diabetes type II Mother   . Coronary artery disease Mother   . Diabetes Mother   . Heart disease Mother   . Hyperlipidemia Mother   . Hypertension Mother   . Diabetes type II Father   . Coronary artery disease Father   . Diabetes Father   . Heart disease Father   . Hyperlipidemia Father   . Diabetes Sister     Social History Social History   Tobacco Use  . Smoking status: Never Smoker  . Smokeless tobacco: Never Used  Substance Use Topics  . Alcohol use: Yes    Alcohol/week: 0.0 standard drinks    Comment: rare  . Drug use: No     Allergies   Antibacterial hand soap [triclosan]   Review of Systems Review of Systems  Constitutional: Negative for fatigue and fever.     Physical Exam Triage Vital Signs ED Triage Vitals  Enc Vitals Group     BP 02/07/19 1123 (!) 153/85     Pulse Rate 02/07/19  1123 (!) 103     Resp 02/07/19 1123 20     Temp 02/07/19 1123 97.7 F (36.5 C)     Temp Source 02/07/19 1123 Oral     SpO2 02/07/19 1123 98 %     Weight 02/07/19 1124 300 lb (136.1 kg)     Height 02/07/19 1124 5\' 5"  (1.651 m)     Head Circumference --      Peak Flow --      Pain Score 02/07/19 1124 1     Pain Loc --      Pain Edu? --      Excl. in [place]? --    No data found.  Updated Vital Signs BP (!) 153/85 (BP Location: Right Arm)   Pulse (!) 103   Temp 97.7 F (36.5 C) (Oral)   Resp 20   Ht 5\' 5"  (1.651 m)   Wt 136.1 kg   LMP 01/17/2012   SpO2 98%   BMI 49.92 kg/m   Visual Acuity Right Eye Distance:   Left Eye Distance:   Bilateral Distance:    Right Eye Near:   Left Eye Near:    Bilateral Near:     Physical Exam Vitals and nursing note reviewed.  Constitutional:      General: She is not in acute distress.    Appearance: She is obese. She is not ill-appearing.  HENT:     Head: Normocephalic.  Eyes:     Pupils: Pupils are equal, round, and reactive to light.  Cardiovascular:     Rate and Rhythm: Tachycardia present.  Pulmonary:     Effort: Pulmonary effort is normal.  Skin:    General: Skin is warm and dry.     Comments: Left axilla:  1cm by 1.5cm superficial fluctuant cystic nodule, tender  to palpation.  Right anterior chest reveals evidence of recent abscess, now healed without cellulitis.  Neurological:     Mental Status: She is alert.      UC Treatments / Results  Labs (all labs ordered are listed, but only abnormal results are displayed) Labs Reviewed  WOUND CULTURE    EKG   Radiology No results found.  Procedures Procedures  Incise and drain cyst/abscess Risks and benefits of procedure explained to patient and verbal consent obtained.  Using sterile technique, applied topical refrigerant spray followed by local anesthesia with 1% lidocaine with epinephrine, and cleansed affected area with Betadine and saline. Identified the most  fluctuant area of lesion and incised with #11 blade.  Expressed blood and small amount of purulent material. Abscess cavity shallow; no packing indicated. Bandage applied.  Patient tolerated well   Medications Ordered in UC Medications - No data to display  Initial Impression / Assessment and Plan / UC Course  I have reviewed the triage vital signs and the nursing notes.  Pertinent labs & imaging results that were available during my care of the patient were reviewed by me and considered in my medical decision making (see chart for details).    Wound culture pending.  Begin doxycycline for staph coverage.   Final Clinical Impressions(s) / UC Diagnoses   Final diagnoses:  Abscess of left axilla     Discharge Instructions     Change dressing daily and apply Bacitracin ointment to wound.  Keep wound clean and dry.  Return for any signs of infection (or follow-up with family doctor):  Increasing redness, swelling, pain, heat, drainage, etc.       ED Prescriptions    Medication Sig Dispense Auth. Provider   doxycycline (VIBRAMYCIN) 100 MG capsule Take 1 capsule (100 mg total) by mouth 2 (two) times daily. Take with food. 10 capsule Kandra Nicolas, MD        Kandra Nicolas, MD 02/14/19 1537

## 2019-02-10 LAB — WOUND CULTURE
MICRO NUMBER:: 10096331
SPECIMEN QUALITY:: ADEQUATE

## 2019-04-30 ENCOUNTER — Other Ambulatory Visit: Payer: Self-pay | Admitting: Family Medicine

## 2019-04-30 DIAGNOSIS — Z1231 Encounter for screening mammogram for malignant neoplasm of breast: Secondary | ICD-10-CM

## 2019-05-01 ENCOUNTER — Other Ambulatory Visit: Payer: Self-pay

## 2019-05-01 ENCOUNTER — Ambulatory Visit
Admission: RE | Admit: 2019-05-01 | Discharge: 2019-05-01 | Disposition: A | Discharge status: Home / Self Care | Payer: Commercial Managed Care - PPO | Source: Ambulatory Visit | Attending: Family Medicine | Admitting: Family Medicine

## 2019-05-01 DIAGNOSIS — Z1231 Encounter for screening mammogram for malignant neoplasm of breast: Secondary | ICD-10-CM

## 2021-05-09 ENCOUNTER — Encounter (HOSPITAL_BASED_OUTPATIENT_CLINIC_OR_DEPARTMENT_OTHER): Payer: Self-pay | Admitting: Emergency Medicine

## 2021-05-09 ENCOUNTER — Emergency Department (HOSPITAL_BASED_OUTPATIENT_CLINIC_OR_DEPARTMENT_OTHER): Payer: Commercial Managed Care - PPO

## 2021-05-09 ENCOUNTER — Other Ambulatory Visit: Payer: Self-pay

## 2021-05-09 ENCOUNTER — Emergency Department (HOSPITAL_BASED_OUTPATIENT_CLINIC_OR_DEPARTMENT_OTHER)
Admission: EM | Admit: 2021-05-09 | Discharge: 2021-05-09 | Disposition: A | Discharge status: Home / Self Care | Payer: Commercial Managed Care - PPO | Attending: Emergency Medicine | Admitting: Emergency Medicine

## 2021-05-09 DIAGNOSIS — I10 Essential (primary) hypertension: Secondary | ICD-10-CM | POA: Insufficient documentation

## 2021-05-09 DIAGNOSIS — Z20822 Contact with and (suspected) exposure to covid-19: Secondary | ICD-10-CM | POA: Diagnosis not present

## 2021-05-09 DIAGNOSIS — R000 Tachycardia, unspecified: Secondary | ICD-10-CM | POA: Insufficient documentation

## 2021-05-09 DIAGNOSIS — J45909 Unspecified asthma, uncomplicated: Secondary | ICD-10-CM | POA: Diagnosis not present

## 2021-05-09 DIAGNOSIS — E1165 Type 2 diabetes mellitus with hyperglycemia: Secondary | ICD-10-CM | POA: Diagnosis not present

## 2021-05-09 DIAGNOSIS — R002 Palpitations: Secondary | ICD-10-CM | POA: Insufficient documentation

## 2021-05-09 DIAGNOSIS — J069 Acute upper respiratory infection, unspecified: Secondary | ICD-10-CM | POA: Insufficient documentation

## 2021-05-09 DIAGNOSIS — R509 Fever, unspecified: Secondary | ICD-10-CM | POA: Diagnosis present

## 2021-05-09 LAB — CBC WITH DIFFERENTIAL/PLATELET
Abs Immature Granulocytes: 0.04 10*3/uL (ref 0.00–0.07)
Basophils Absolute: 0.1 10*3/uL (ref 0.0–0.1)
Basophils Relative: 1 %
Eosinophils Absolute: 0.1 10*3/uL (ref 0.0–0.5)
Eosinophils Relative: 1 %
HCT: 42.3 % (ref 36.0–46.0)
Hemoglobin: 14.7 g/dL (ref 12.0–15.0)
Immature Granulocytes: 0 %
Lymphocytes Relative: 10 %
Lymphs Abs: 0.9 10*3/uL (ref 0.7–4.0)
MCH: 32.7 pg (ref 26.0–34.0)
MCHC: 34.8 g/dL (ref 30.0–36.0)
MCV: 94 fL (ref 80.0–100.0)
Monocytes Absolute: 0.9 10*3/uL (ref 0.1–1.0)
Monocytes Relative: 10 %
Neutro Abs: 7.2 10*3/uL (ref 1.7–7.7)
Neutrophils Relative %: 78 %
Platelets: 290 10*3/uL (ref 150–400)
RBC: 4.5 MIL/uL (ref 3.87–5.11)
RDW: 12.6 % (ref 11.5–15.5)
WBC: 9.2 10*3/uL (ref 4.0–10.5)
nRBC: 0 % (ref 0.0–0.2)

## 2021-05-09 LAB — BASIC METABOLIC PANEL
Anion gap: 10 (ref 5–15)
BUN: 12 mg/dL (ref 6–20)
CO2: 23 mmol/L (ref 22–32)
Calcium: 9.5 mg/dL (ref 8.9–10.3)
Chloride: 101 mmol/L (ref 98–111)
Creatinine, Ser: 0.78 mg/dL (ref 0.44–1.00)
GFR, Estimated: >60 mL/min (ref >60)
Glucose, Bld: 154 mg/dL — ABNORMAL HIGH (ref 70–99)
Potassium: 4 mmol/L (ref 3.5–5.1)
Sodium: 134 mmol/L — ABNORMAL LOW (ref 135–145)

## 2021-05-09 LAB — CBG MONITORING, ED: Glucose-Capillary: 160 mg/dL — ABNORMAL HIGH (ref 70–99)

## 2021-05-09 LAB — RESP PANEL BY RT-PCR (FLU A&B, COVID) ARPGX2
Influenza A by PCR: NEGATIVE
Influenza B by PCR: NEGATIVE
SARS Coronavirus 2 by RT PCR: NEGATIVE

## 2021-05-09 MED ORDER — SODIUM CHLORIDE 0.9 % IV BOLUS
1000.0000 mL | Freq: Once | INTRAVENOUS | Status: AC
Start: 2021-05-09 — End: 2021-05-09
  Administered 2021-05-09: 1000 mL via INTRAVENOUS

## 2021-05-09 MED ORDER — IBUPROFEN 400 MG PO TABS
400.0000 mg | ORAL_TABLET | Freq: Once | ORAL | Status: AC
  Administered 2021-05-09: 400 mg via ORAL
  Filled 2021-05-09: qty 1

## 2021-05-09 NOTE — ED Notes (Signed)
ED Provider at bedside. 

## 2021-05-09 NOTE — ED Provider Triage Note (Signed)
Emergency Medicine Provider Triage Evaluation Note ? ?Karl Pock , a 46 y.o. female  was evaluated in triage.  Pt complains of cough, fever, tachycardia. ? ?Review of Systems  ?Positive: Fever and cough ?Negative: vomiting ? ?Physical Exam  ?BP (!) 151/92   Pulse (!) 120   Temp 100 ?F (37.8 ?C) (Oral)   Resp 20   Ht 1.651 m ('5\' 5"'$ )   Wt 131.5 kg   LMP 01/17/2012   SpO2 97%   BMI 48.26 kg/m?  ?Gen:   Awake, no distress   ?Resp:  Normal effort  ?MSK:   Moves extremities without difficulty  ?Other:   ? ?Medical Decision Making  ?Medically screening exam initiated at 6:28 AM.  Appropriate orders placed.  Meridee Branum was informed that the remainder of the evaluation will be completed by another provider, this initial triage assessment does not replace that evaluation, and the importance of remaining in the ED until their evaluation is complete. ? ? ?  ?Ripley Fraise, MD ?05/09/21 [phone] ? ?

## 2021-05-09 NOTE — ED Triage Notes (Signed)
Reports she came home from [place] last night.  Her watch woke her up tonight saying her HR was in the 160's.  Reports temp was 100.6 at home.  Took tylenol 2 hours pta. ?

## 2021-05-09 NOTE — Discharge Instructions (Signed)
You were seen in the emergency room today with cough and congestion symptoms.  Please avoid "daytime" formulation of over-the-counter cough and cold medications as these can increase your heart rate along with your blood pressure.  Please follow closely with your primary care doctor to review your symptoms.  If you develop any new or suddenly worsening symptoms please return for reevaluation. ?

## 2021-05-09 NOTE — ED Provider Notes (Signed)
? ?Emergency Department Provider Note ? ? ?I have reviewed the triage vital signs and the nursing notes. ? ? ?HISTORY ? ?Chief Complaint ?Fever and Cough ? ? ?HPI ?Kelli Allen is a 46 y.o. female with past medical history reviewed below presents emergency department for evaluation of fever along with tachycardia.  Patient returned home from vacation noting that her heart rate was elevated.  She was alerted by her watch describing heart rate as high as 160.  She was not having chest pain/pressure/tightness.  No particular shortness of breath.  She took a temp at home and found to be 100.6 ?F.  She is having some mild cough.  No headache, abdominal pain, UTI symptoms. No GI symptoms. Patient is currently asymptomatic.  ? ? ?Past Medical History:  ?Diagnosis Date  ? Abnormal Pap smear 2000  ? Anxiety   ? Asthma   ? no issues in years  ? Cellulitis of lower leg   ? x3 on right leg  ? Cushing syndrome ([place]) 11/26/2013  ? s/p transsphenoidal hypophysectomy for ACTH secreting pituitary lesion  ? Cushing's syndrome ([place]) 09/25/2013  ? MRI done on 10/02/2013  ? Diabetes mellitus   ? Endometrial ca ([place]) 12/20/2011  ? Family history of anesthesia complication   ? sister had hard time waking up and problems breathing  ? Hypertension   ? Irregular menstrual bleeding   ? Neuromuscular disorder ([place])   ? DIABETIC NEUROPATHY  ? PCOS (polycystic ovarian syndrome)   ? Tachycardia   ? ? ?Review of Systems ? ?Constitutional: Positive fever/chills ?Eyes: No visual changes. ?ENT: No sore throat. Positive nasal congestion.  ?Cardiovascular: Denies chest pain. Positive palpitations.  ?Respiratory: Denies shortness of breath. Positive cough.  ?Gastrointestinal: No abdominal pain.  No nausea, no vomiting.  No diarrhea.  No constipation. ?Genitourinary: Negative for dysuria. ?Musculoskeletal: Negative for back pain. ?Skin: Negative for rash. ?Neurological: Negative for headaches, focal weakness or  numbness. ? ? ?____________________________________________ ? ? ?PHYSICAL EXAM: ? ?VITAL SIGNS: ?ED Triage Vitals  ?Enc Vitals Group  ?   BP 05/09/21 0526 (!) 151/92  ?   Pulse Rate 05/09/21 0526 (!) 120  ?   Resp 05/09/21 0526 20  ?   Temp 05/09/21 0526 100 ?F (37.8 ?C)  ?   Temp Source 05/09/21 0526 Oral  ?   SpO2 05/09/21 0526 97 %  ?   Weight 05/09/21 0542 290 lb (131.5 kg)  ?   Height 05/09/21 0542 '5\' 5"'$  (1.651 m)  ? ?Constitutional: Alert and oriented. Well appearing and in no acute distress. ?Eyes: Conjunctivae are normal.  ?Head: Atraumatic. ?Nose: Positive congestion/rhinnorhea. ?Mouth/Throat: Mucous membranes are moist.  ?Neck: No stridor.   ?Cardiovascular: Tachycardia. Good peripheral circulation. Grossly normal heart sounds.   ?Respiratory: Normal respiratory effort.  No retractions. Lungs CTAB. ?Gastrointestinal: Soft and nontender. No distention.  ?Musculoskeletal: No lower extremity tenderness nor edema. No gross deformities of extremities. ?Neurologic:  Normal speech and language. No gross focal neurologic deficits are appreciated.  ?Skin:  Skin is warm, dry and intact. No rash noted. ? ?____________________________________________ ?  ?LABS ?(all labs ordered are listed, but only abnormal results are displayed) ? ?Labs Reviewed  ?BASIC METABOLIC PANEL - Abnormal; Notable for the following components:  ?    Result Value  ? Sodium 134 (*)   ? Glucose, Bld 154 (*)   ? All other components within normal limits  ?CBG MONITORING, ED - Abnormal; Notable for the following components:  ? Glucose-Capillary 160 (*)   ?  All other components within normal limits  ?RESP PANEL BY RT-PCR (FLU A&B, COVID) [id number]  ?CBC WITH DIFFERENTIAL/PLATELET  ? ?____________________________________________ ? ?EKG ? ? EKG Interpretation ? ?Date/Time:  Monday May 09 2021 05:29:00 EDT ?Ventricular Rate:  125 ?PR Interval:  158 ?QRS Duration: 84 ?QT Interval:  306 ?QTC Calculation: 441 ?R Axis:   14 ?Text Interpretation: Sinus  tachycardia Otherwise normal ECG When compared with ECG of 02-Mar-2014 10:36, No significant change since last tracing Confirmed by Ripley Fraise [phone]) on 05/09/2021 5:37:16 AM ?  ? ?  ? ? ?____________________________________________ ? ?RADIOLOGY ? ?DG Chest Portable 1 View ? ?Result Date: 05/09/2021 ?CLINICAL DATA:  Tachycardia and fever EXAM: PORTABLE CHEST 1 VIEW COMPARISON:  08/07/2014 FINDINGS: Normal heart size and mediastinal contours. No acute infiltrate or edema. No effusion or pneumothorax. No acute osseous findings. Artifact from EKG leads. IMPRESSION: No active disease. Electronically Signed   By: Jorje Guild M.D.   On: 05/09/2021 06:11   ? ?____________________________________________ ? ? ?PROCEDURES ? ?Procedure(s) performed:  ? ?Procedures ? ?None  ?____________________________________________ ? ? ?INITIAL IMPRESSION / ASSESSMENT AND PLAN / ED COURSE ? ?Pertinent labs & imaging results that were available during my care of the patient were reviewed by me and considered in my medical decision making (see chart for details). ?  ?This patient is Presenting for Evaluation of fever, which does require a range of treatment options, and is a complaint that involves a high risk of morbidity and mortality. ? ?The Differential Diagnoses include viral process, CAP, developing sepsis, dehydration, medication side effect. ? ?Critical Interventions-  ?  ?Medications  ?sodium chloride 0.9 % bolus 1,000 mL ( Intravenous Stopped 05/09/21 0756)  ?ibuprofen (ADVIL) tablet 400 mg (400 mg Oral Given 05/09/21 0649)  ? ? ?Reassessment after intervention: HR and temp down-trending. Patient feeling "much better" and looking very well.  ? ?  ?Clinical Laboratory Tests Ordered, included CBC without leukocytosis.  COVID and flu were negative.  No acute kidney injury or severe electrolyte disturbance. ? ?Radiologic Tests Ordered, included CXR. I independently interpreted the images and agree with radiology interpretation.   ? ?Cardiac Monitor Tracing which shows sinus tachycardia (90-105) ? ? ?Social Determinants of Health Risk patient is a non-smoker.  ? ?Medical Decision Making: Summary:  ?Patient presents emergency department for evaluation of fever with heart palpitations.  Arrives with elevated heart rate although improved with ibuprofen and IV fluids.  My overall suspicion for developing sepsis is very low.  No leukocytosis and patient appears very well.  Does not appear to be in acute arrhythmia requiring cardioversion, admit, transfer.  Plan for continued supportive care at home along with strict ED return precaution. ? ?Reevaluation with update and discussion with patient. She is comfortable with the plan at discharge.  ? ?Disposition: discharge ? ?____________________________________________ ? ?FINAL CLINICAL IMPRESSION(S) / ED DIAGNOSES ? ?Final diagnoses:  ?Viral upper respiratory tract infection  ?Tachycardia  ? ? ?Note:  This document was prepared using Dragon voice recognition software and may include unintentional dictation errors. ? ?Nanda Quinton, MD, FACEP ?Emergency Medicine ? ?  ?Margette Fast, MD ?05/10/21 [phone] ? ?

## 2021-06-20 ENCOUNTER — Emergency Department (HOSPITAL_BASED_OUTPATIENT_CLINIC_OR_DEPARTMENT_OTHER): Payer: Commercial Managed Care - PPO

## 2021-06-20 ENCOUNTER — Ambulatory Visit: Payer: Self-pay | Admitting: *Deleted

## 2021-06-20 ENCOUNTER — Other Ambulatory Visit: Payer: Self-pay

## 2021-06-20 ENCOUNTER — Emergency Department (HOSPITAL_BASED_OUTPATIENT_CLINIC_OR_DEPARTMENT_OTHER)
Admission: EM | Admit: 2021-06-20 | Discharge: 2021-06-20 | Disposition: A | Discharge status: Home / Self Care | Payer: Commercial Managed Care - PPO | Attending: Emergency Medicine | Admitting: Emergency Medicine

## 2021-06-20 ENCOUNTER — Encounter (HOSPITAL_BASED_OUTPATIENT_CLINIC_OR_DEPARTMENT_OTHER): Payer: Self-pay | Admitting: Emergency Medicine

## 2021-06-20 DIAGNOSIS — Z79899 Other long term (current) drug therapy: Secondary | ICD-10-CM | POA: Insufficient documentation

## 2021-06-20 DIAGNOSIS — Z794 Long term (current) use of insulin: Secondary | ICD-10-CM | POA: Insufficient documentation

## 2021-06-20 DIAGNOSIS — M79671 Pain in right foot: Secondary | ICD-10-CM | POA: Diagnosis present

## 2021-06-20 DIAGNOSIS — L03115 Cellulitis of right lower limb: Secondary | ICD-10-CM | POA: Insufficient documentation

## 2021-06-20 DIAGNOSIS — I10 Essential (primary) hypertension: Secondary | ICD-10-CM | POA: Diagnosis not present

## 2021-06-20 DIAGNOSIS — Z7984 Long term (current) use of oral hypoglycemic drugs: Secondary | ICD-10-CM | POA: Diagnosis not present

## 2021-06-20 DIAGNOSIS — D72829 Elevated white blood cell count, unspecified: Secondary | ICD-10-CM | POA: Insufficient documentation

## 2021-06-20 DIAGNOSIS — E119 Type 2 diabetes mellitus without complications: Secondary | ICD-10-CM | POA: Insufficient documentation

## 2021-06-20 LAB — CBC WITH DIFFERENTIAL/PLATELET
Abs Immature Granulocytes: 0.05 10*3/uL (ref 0.00–0.07)
Basophils Absolute: 0.1 10*3/uL (ref 0.0–0.1)
Basophils Relative: 1 %
Eosinophils Absolute: 0.2 10*3/uL (ref 0.0–0.5)
Eosinophils Relative: 1 %
HCT: 43.8 % (ref 36.0–46.0)
Hemoglobin: 15 g/dL (ref 12.0–15.0)
Immature Granulocytes: 0 %
Lymphocytes Relative: 24 %
Lymphs Abs: 2.7 10*3/uL (ref 0.7–4.0)
MCH: 32.5 pg (ref 26.0–34.0)
MCHC: 34.2 g/dL (ref 30.0–36.0)
MCV: 95 fL (ref 80.0–100.0)
Monocytes Absolute: 0.8 10*3/uL (ref 0.1–1.0)
Monocytes Relative: 7 %
Neutro Abs: 7.5 10*3/uL (ref 1.7–7.7)
Neutrophils Relative %: 67 %
Platelets: 297 10*3/uL (ref 150–400)
RBC: 4.61 MIL/uL (ref 3.87–5.11)
RDW: 12.6 % (ref 11.5–15.5)
WBC: 11.3 10*3/uL — ABNORMAL HIGH (ref 4.0–10.5)
nRBC: 0 % (ref 0.0–0.2)

## 2021-06-20 LAB — URINALYSIS, ROUTINE W REFLEX MICROSCOPIC
Bilirubin Urine: NEGATIVE
Glucose, UA: NEGATIVE mg/dL
Hgb urine dipstick: NEGATIVE
Ketones, ur: NEGATIVE mg/dL
Leukocytes,Ua: NEGATIVE
Nitrite: NEGATIVE
Protein, ur: NEGATIVE mg/dL
Specific Gravity, Urine: 1.02 (ref 1.005–1.030)
pH: 5.5 (ref 5.0–8.0)

## 2021-06-20 LAB — COMPREHENSIVE METABOLIC PANEL
ALT: 30 U/L (ref 0–44)
AST: 24 U/L (ref 15–41)
Albumin: 4.2 g/dL (ref 3.5–5.0)
Alkaline Phosphatase: 97 U/L (ref 38–126)
Anion gap: 8 (ref 5–15)
BUN: 12 mg/dL (ref 6–20)
CO2: 29 mmol/L (ref 22–32)
Calcium: 10 mg/dL (ref 8.9–10.3)
Chloride: 102 mmol/L (ref 98–111)
Creatinine, Ser: 0.76 mg/dL (ref 0.44–1.00)
GFR, Estimated: >60 mL/min (ref >60)
Glucose, Bld: 186 mg/dL — ABNORMAL HIGH (ref 70–99)
Potassium: 4.2 mmol/L (ref 3.5–5.1)
Sodium: 139 mmol/L (ref 135–145)
Total Bilirubin: 0.7 mg/dL (ref 0.3–1.2)
Total Protein: 7.8 g/dL (ref 6.5–8.1)

## 2021-06-20 LAB — PREGNANCY, URINE: Preg Test, Ur: NEGATIVE

## 2021-06-20 LAB — LACTIC ACID, PLASMA: Lactic Acid, Venous: 1.9 mmol/L (ref 0.5–1.9)

## 2021-06-20 MED ORDER — CIPROFLOXACIN HCL 500 MG PO TABS: 500.0000 mg | ORAL_TABLET | Freq: Two times a day (BID) | ORAL | 0 refills | Status: AC

## 2021-06-20 NOTE — Discharge Instructions (Signed)
Stop taking the doxycycline.  Instead take the ciprofloxacin.  Ibuprofen and Tylenol may be utilized for pain control and swelling.  Follow-up with podiatry as needed.  General surgery will not intervene on this type of problem.  Your PCP is always a good place to start if you are not getting any better after the course of antibiotics.  Return with any worsening symptoms.

## 2021-06-20 NOTE — ED Provider Notes (Signed)
[facility] EMERGENCY DEPARTMENT Provider Note   CSN: [id number] Arrival date & time: 06/20/21  1334     History  Chief Complaint  Patient presents with   Foot Pain    Kelli Allen is a 46 y.o. female with a past medical history of type 2 diabetes and hypertension presenting with right foot infection.  She was seen for the same 2 days ago with [facility] and discharged for presumed cellulitis.  She was started on doxycycline and has been taking this as prescribed but says that the pain, swelling and rash seems to be worsening.  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  Says that she wears some crocs that had a puncture wound.  Has a history of cellulitis requiring admission for IV antibiotics that she wanted to make sure she got ahead of this.  No fevers or night sweats.   Foot Pain       Home Medications Prior to Admission medications   Medication Sig Start Date End Date Taking? Authorizing Provider  albuterol (PROAIR HFA) 108 (90 BASE) MCG/ACT inhaler Inhale 2 puffs into the lungs every 6 (six) hours as needed for shortness of breath. 05/14/14   Wardell Honour, MD  atorvastatin (LIPITOR) 20 MG tablet Take 1 tablet (20 mg total) by mouth daily at 6 PM. TAKE ONE TABLET BY MOUTH ONE TIME DAILY AT 6 PM. 07/23/17   Wardell Honour, MD  BAYER CONTOUR TEST test strip Patient uses one touch strips 12/12/13   [provider]  cephALEXin (KEFLEX) 500 MG capsule Take 1 capsule (500 mg total) by mouth 4 (four) times daily. 12/16/17   Law, Bea Graff, PA-C  doxycycline (VIBRAMYCIN) 100 MG capsule Take 1 capsule (100 mg total) by mouth 2 (two) times daily. Take with food. 02/07/19   Kandra Nicolas, MD  Dulaglutide 1.5 MG/0.5ML SOPN Inject into the skin. 04/30/17   [provider]  Ergocalciferol (VITAMIN D2) 2000 UNITS TABS Take 1 tablet by mouth daily.     [provider]  FLUoxetine (PROZAC) 20 MG tablet Take 3 tablets (60 mg total) by mouth daily. 07/04/17   Wardell Honour, MD  gabapentin (NEURONTIN) 300 MG capsule TAKE 1-2 CAPSULES (300-600 MG TOTAL) BY MOUTH 3 (THREE) TIMES DAILY. 07/04/17   Wardell Honour, MD  insulin aspart (NOVOLOG FLEXPEN) 100 UNIT/ML FlexPen Inject 22 units with a correction 1:50 > 150 three times daily before meals 10/10/13   [provider]  Insulin Glargine 300 UNIT/ML SOPN Inject into the skin. 04/30/17   [provider]  ipratropium (ATROVENT) 0.03 % nasal spray Place 2 sprays into both nostrils 2 (two) times daily. 02/02/17   Ivar Drape D, PA  labetalol (NORMODYNE) 200 MG tablet TAKE 1 TABLET BY MOUTH 2 (TWO) TIMES DAILY. 12/31/14   Wardell Honour, MD  lisinopril-hydrochlorothiazide (PRINZIDE,ZESTORETIC) 10-12.5 MG tablet Take 1 tablet by mouth daily. 07/23/17   Wardell Honour, MD  metformin (FORTAMET) 500 MG (OSM) 24 hr tablet Take 1,000 mg by mouth 2 (two) times daily with a meal.     [provider]  Multiple Vitamin (MULTIVITAMIN) capsule Take 1 capsule by mouth 2 (two) times daily.     [provider]  Omega-3 1000 MG CAPS Take 3 g by mouth daily. Reported on 02/03/2015    [provider]  OVER THE COUNTER MEDICATION Take  1 packet by mouth 2 (two) times daily. Nutralite multi  Vitamins    [provider]  OVER THE COUNTER MEDICATION Take 1 tablet by mouth 2 (two) times daily. Glucose Health Cromium Piccolate    [provider]  predniSONE (STERAPRED UNI-PAK 21 TAB) 10 MG (21) TBPK tablet Take by mouth daily. 6 tbs PO qd x2 days, 5 tbs qd x2 days, 4 tabs qd x2 days, 3 tabs qd x2 days, 2 tabs qd x2 days, 1 tab qd x2 days 09/29/17   Caccavale, Sophia, PA-C  Probiotic Product (PROBIOTIC DAILY PO) Take 1 tablet by mouth daily. Reported on 02/03/2015    [provider]  rOPINIRole (REQUIP) 0.25 MG tablet TAKE 1 TABLET (0.25 MG TOTAL) BY MOUTH AT BEDTIME.  07/19/15   Wardell Honour, MD  spironolactone (ALDACTONE) 25 MG tablet Take 1 tablet (25 mg total) by mouth daily. 07/23/17   Wardell Honour, MD  tretinoin (RETIN-A) 0.025 % gel Apply topically at bedtime. 07/23/17   Wardell Honour, MD  valACYclovir (VALTREX) 1000 MG tablet Take 1 tablet (1,000 mg total) by mouth 3 (three) times daily. 09/29/17   Malvin Johns, MD      Allergies    Antibacterial hand soap [triclosan]    Review of Systems   Review of Systems  Physical Exam Updated Vital Signs BP (!) 143/83   Pulse (!) 113   Temp 98.3 F (36.8 C) (Oral)   Resp 19   Ht '5\' 5"'$  (1.651 m)   Wt 132.9 kg   LMP 01/17/2012   SpO2 97%   BMI 48.76 kg/m  Physical Exam Vitals and nursing note reviewed.  Constitutional:      Appearance: Normal appearance.  HENT:     Head: Normocephalic and atraumatic.  Eyes:     General: No scleral icterus.    Conjunctiva/sclera: Conjunctivae normal.  Pulmonary:     Effort: Pulmonary effort is normal. No respiratory distress.  Musculoskeletal:     Right lower leg: Edema present.     Left lower leg: Edema present.     Comments: Bilateral lower extremity edema to the level of the ankle.  2 mm puncture wound to the right plantar foot.  Associated erythematous surrounding skin and streaking up to the dorsal surface of the foot.  Present DP pulses bilaterally, no drainage or fluctuance.  Skin:    Findings: No rash.  Neurological:     Mental Status: She is alert.  Psychiatric:        Mood and Affect: Mood normal.     ED Results / Procedures / Treatments   Labs (all labs ordered are listed, but only abnormal results are displayed) Labs Reviewed  COMPREHENSIVE METABOLIC PANEL - Abnormal; Notable for the following components:      Result Value   Glucose, Bld 186 (*)    All other components within normal limits  CBC WITH DIFFERENTIAL/PLATELET - Abnormal; Notable for the following components:   WBC 11.3 (*)    All other components within normal limits   LACTIC ACID, PLASMA  URINALYSIS, ROUTINE W REFLEX MICROSCOPIC  PREGNANCY, URINE  LACTIC ACID, PLASMA    EKG None  Radiology No results found.  Procedures Procedures   Medications Ordered in ED Medications - No data to display  ED Course/ Medical Decision Making/ A&P Clinical Course as of 06/20/21 1648  Mon Jun 20, 2021  1626 DG Foot Complete Right [MR]    Clinical Course User Index [MR] Kennth Vanbenschoten, Parker,  PA-C                           Medical Decision Making Amount and/or Complexity of Data Reviewed Labs: ordered. Radiology: ordered. Decision-making details documented in ED Course.  Risk Prescription drug management.   This patient presents to the ED for concern of infection to the right lower foot.  Differential includes but is not limited to cellulitis, erysipelas, abscess and necrosis.   This is not an exhaustive differential.    Past Medical History / Co-morbidities / Social History: Reportedly well controlled diabetes, Cushing disease and hypertension   Additional history: Additional history obtained from external chart review.  Patient went to [facility] yesterday and was given doxycycline.  An x-ray was performed and had a questionable foreign body that patient may have stepped onto initiate her current symptoms.  She was started on doxycycline and told to follow-up with primary in 3 days.   Physical Exam: Physical exam performed. The pertinent findings include: 2 mm puncture wound to the plantar surface of the right foot.  Associated erythema and cellulitis to the surrounding skin.  Streaking up to the dorsal side of the right foot.  Lab Tests: I ordered, and personally interpreted labs.  The pertinent results include:  -Leukocytosis 11.3 -Negative lactic   Imaging Studies: I ordered imaging studies including foot x-ray. I independently visualized and interpreted imaging which showed same potential 3 mm foreign body. I agree with the radiologist  interpretation.     Disposition: 46 year old female presenting with 3 days worth of cellulitis.  Was seen at urgent care over the weekend and started on doxycycline.  Says that the infection seems to get worse.  After taking a history, it appears patient stopped on a foreign body while working outdoors in her chicken coop.  Says that she also walks barefoot outside and inside her house on occasion.  I believe Pseudomonas needs to be the bacteria targeted, which is not successfully treated with doxycycline.    After consideration of the diagnostic results and the patients response to treatment, I feel that she is stable for discharge home.  White blood cell count 11.3, nonconcerning.  Lactic negative.  She will stop taking the doxycycline and instead take quinolone antibiotic.  She will follow-up with podiatry as needed.     I discussed this case with my attending physician Dr. Tomi Bamberger who cosigned this note including patient's presenting symptoms, physical exam, and planned diagnostics and interventions. Attending physician stated agreement with plan or made changes to plan which were implemented.     Final Clinical Impression(s) / ED Diagnoses Final diagnoses:  Cellulitis of right foot    Rx / DC Orders ED Discharge Orders          Ordered    ciprofloxacin (CIPRO) 500 MG tablet  Every 12 hours        06/20/21 1459           Results and diagnoses were explained to the patient. Return precautions discussed in full. Patient had no additional questions and expressed complete understanding.   This chart was dictated using voice recognition software.  Despite best efforts to proofread,  errors can occur which can change the documentation meaning.     Darliss Ridgel 06/20/21 1649    Dorie Rank, MD 06/21/21 [phone]

## 2021-06-20 NOTE — ED Triage Notes (Signed)
Pt POV c/o foot pain and swelling starting Friday. Seen at UC, foot wound swabbed for cultures. Xray done at [facility], started on doxycyline on Saturday, compliant with meds. Pt reports worsening, spreading redness.

## 2021-06-20 NOTE — Telephone Encounter (Signed)
Hung up before I spoke with her.

## 2022-03-21 ENCOUNTER — Encounter (HOSPITAL_BASED_OUTPATIENT_CLINIC_OR_DEPARTMENT_OTHER): Payer: Self-pay

## 2022-03-21 ENCOUNTER — Emergency Department (HOSPITAL_BASED_OUTPATIENT_CLINIC_OR_DEPARTMENT_OTHER): Payer: Commercial Managed Care - PPO

## 2022-03-21 ENCOUNTER — Emergency Department (HOSPITAL_BASED_OUTPATIENT_CLINIC_OR_DEPARTMENT_OTHER)
Admission: EM | Admit: 2022-03-21 | Discharge: 2022-03-21 | Disposition: A | Discharge status: Home / Self Care | Payer: Commercial Managed Care - PPO | Attending: Emergency Medicine | Admitting: Emergency Medicine

## 2022-03-21 ENCOUNTER — Other Ambulatory Visit: Payer: Self-pay

## 2022-03-21 DIAGNOSIS — Z794 Long term (current) use of insulin: Secondary | ICD-10-CM | POA: Insufficient documentation

## 2022-03-21 DIAGNOSIS — M79604 Pain in right leg: Secondary | ICD-10-CM

## 2022-03-21 NOTE — ED Provider Notes (Signed)
[facility]GENCY DEPART[facility] Provider Note   CSN: [id number] Arrival date & time: 03/21/22  1254     History  Chief Complaint  Patient presents with   Leg Pain    Kelli Allen is a 47 y.o. female.  Patient complains of pain in her right leg.  Patient reports that she has a cramping pain in her calf and down the right side of her leg.  Patient reports symptoms began 5 days ago and has progressively gotten worse.  Patient reports she saw her primary care doctor 4 days ago and had basic labs and thyroid testing.  The history is provided by the patient. No language interpreter was used.  Leg Pain Location:  Leg and knee Time since incident:  5 days Leg location:  R leg Pain details:    Quality:  Aching   Timing:  Constant   Progression:  Worsening Chronicity:  New Relieved by:  Nothing Worsened by:  Nothing Risk factors: no concern for non-accidental trauma        Home Medications Prior to Admission medications   Medication Sig Start Date End Date Taking? Authorizing Provider  albuterol (PROAIR HFA) 108 (90 BASE) MCG/ACT inhaler Inhale 2 puffs into the lungs every 6 (six) hours as needed for shortness of breath. 05/14/14   Wardell Honour, MD  atorvastatin (LIPITOR) 20 MG tablet Take 1 tablet (20 mg total) by mouth daily at 6 PM. TAKE ONE TABLET BY MOUTH ONE TIME DAILY AT 6 PM. 07/23/17   Wardell Honour, MD  BAYER CONTOUR TEST test strip Patient uses one touch strips 12/12/13   [provider]  cephALEXin (KEFLEX) 500 MG capsule Take 1 capsule (500 mg total) by mouth 4 (four) times daily. 12/16/17   Law, Bea Graff, PA-C  doxycycline (VIBRAMYCIN) 100 MG capsule Take 1 capsule (100 mg total) by mouth 2 (two) times daily. Take with food. 02/07/19   Kandra Nicolas, MD  Dulaglutide 1.5 MG/0.5ML SOPN Inject into the skin. 04/30/17   [provider]  Ergocalciferol (VITAMIN D2) 2000 UNITS TABS Take 1 tablet by mouth daily.     [provider]  FLUoxetine (PROZAC) 20 MG tablet Take 3 tablets (60 mg total) by mouth daily. 07/04/17   Wardell Honour, MD  gabapentin (NEURONTIN) 300 MG capsule TAKE 1-2 CAPSULES (300-600 MG TOTAL) BY MOUTH 3 (THREE) TIMES DAILY. 07/04/17   Wardell Honour, MD  insulin aspart (NOVOLOG FLEXPEN) 100 UNIT/ML FlexPen Inject 22 units with a correction 1:50 > 150 three times daily before meals 10/10/13   [provider]  Insulin Glargine 300 UNIT/ML SOPN Inject into the skin. 04/30/17   [provider]  ipratropium (ATROVENT) 0.03 % nasal spray Place 2 sprays into both nostrils 2 (two) times daily. 02/02/17   Ivar Drape D, PA  labetalol (NORMODYNE) 200 MG tablet TAKE 1 TABLET BY MOUTH 2 (TWO) TIMES DAILY. 12/31/14   Wardell Honour, MD  lisinopril-hydrochlorothiazide (PRINZIDE,ZESTORETIC) 10-12.5 MG tablet Take 1 tablet by mouth daily. 07/23/17   Wardell Honour, MD  metformin (FORTAMET) 500 MG (OSM) 24 hr tablet Take 1,000 mg by mouth 2 (two) times daily with a meal.     [provider]  Multiple Vitamin (MULTIVITAMIN) capsule Take 1 capsule by mouth 2 (two) times daily.     [provider]  Omega-3 1000 MG CAPS Take 3 g by mouth daily. Reported on 02/03/2015    [provider]  OVER THE COUNTER MEDICATION Take 1 packet by mouth 2 (two) times daily. Nutralite multi  Vitamins    [provider]  OVER THE COUNTER MEDICATION Take 1 tablet by mouth 2 (two) times daily. Glucose Health Cromium Piccolate    [provider]  predniSONE (STERAPRED UNI-PAK 21 TAB) 10 MG (21) TBPK tablet Take by mouth daily. 6 tbs PO qd x2 days, 5 tbs qd x2 days, 4 tabs qd x2 days, 3 tabs qd x2 days, 2 tabs qd x2 days, 1 tab qd x2 days 09/29/17   Caccavale, Sophia, PA-C  Probiotic Product (PROBIOTIC DAILY PO) Take 1 tablet by mouth daily. Reported on 02/03/2015    [provider]  rOPINIRole (REQUIP) 0.25 MG tablet TAKE 1 TABLET (0.25 MG TOTAL) BY MOUTH AT  BEDTIME. 07/19/15   Wardell Honour, MD  spironolactone (ALDACTONE) 25 MG tablet Take 1 tablet (25 mg total) by mouth daily. 07/23/17   Wardell Honour, MD  tretinoin (RETIN-A) 0.025 % gel Apply topically at bedtime. 07/23/17   Wardell Honour, MD  valACYclovir (VALTREX) 1000 MG tablet Take 1 tablet (1,000 mg total) by mouth 3 (three) times daily. 09/29/17   Malvin Johns, MD      Allergies    Antibacterial hand soap [triclosan]    Review of Systems   Review of Systems  Musculoskeletal:  Positive for myalgias.  All other systems reviewed and are negative.   Physical Exam Updated Vital Signs BP (!) 144/127 (BP Location: Right Arm)   Pulse 94   Temp 97.7 F (36.5 C)   Resp 18   Ht '5\' 5"'$  (1.651 m)   Wt 129.3 kg   LMP 01/17/2012   SpO2 97%   BMI 47.43 kg/m  Physical Exam Vitals and nursing note reviewed.  Constitutional:      Appearance: She is well-developed.  HENT:     Head: Normocephalic.  Cardiovascular:     Rate and Rhythm: Normal rate.  Pulmonary:     Effort: Pulmonary effort is normal.  Abdominal:     General: There is no distension.  Musculoskeletal:        General: Swelling and tenderness present.     Cervical back: Normal range of motion.     Comments: Tender right calf.  Positive Homans neurosensory are intact  Skin:    General: Skin is warm.  Neurological:     General: No focal deficit present.     Mental Status: She is alert and oriented to person, place, and time.     ED Results / Procedures / Treatments   Labs (all labs ordered are listed, but only abnormal results are displayed) Labs Reviewed - No data to display  EKG None  Radiology US Venous Img Lower Unilateral Right  Result Date: 03/21/2022 CLINICAL DATA:  Right lower extremity swelling and pain for 5 days EXAM: RIGHT LOWER EXTREMITY VENOUS DOPPLER ULTRASOUND TECHNIQUE: Gray-scale sonography with compression, as well as color and duplex ultrasound, were performed to evaluate the deep venous  system(s) from the level of the common femoral vein through the popliteal and proximal calf veins. COMPARISON:  None Available. FINDINGS: VENOUS Normal compressibility of the common femoral, superficial femoral, and popliteal veins, as well as the visualized calf veins. Visualized portions of profunda femoral vein and great saphenous vein unremarkable. No filling defects to suggest DVT on grayscale or color Doppler imaging. Doppler waveforms show normal direction of venous flow, normal respiratory plasticity and response to augmentation. Limited views of the  contralateral common femoral vein are unremarkable. OTHER None. Limitations: none IMPRESSION: 1. No evidence of deep venous thrombosis within the right lower extremity. Electronically Signed   By: Randa Ngo M.D.   On: 03/21/2022 16:04    Procedures Procedures    Medications Ordered in ED Medications - No data to display  ED Course/ Medical Decision Making/ A&P                             Medical Decision Making Patient complains of cramping and discomfort in right leg  Amount and/or Complexity of Data Reviewed Radiology: ordered and independent interpretation performed. Decision-making details documented in ED Course.    Details: Ultrasound right lower extremity no evidence of DVT  Risk Risk Details: Patient counseled on findings of ultrasound patient advised to try ibuprofen or Aleve for symptomatic muscle cramping.  Patient is advised to follow-up with her primary care physician for recheck and endocrinology as scheduled           Final Clinical Impression(s) / ED Diagnoses Final diagnoses:  Right leg pain    Rx / DC Orders ED Discharge Orders     None      An After Visit Summary was printed and given to the patient.    Fransico Meadow, Hershal Coria 03/21/22 1640    Regan Lemming, MD 03/21/22 [phone]

## 2022-03-21 NOTE — Discharge Instructions (Signed)
Try ibuprofen or aleve for symptoms

## 2022-03-21 NOTE — ED Notes (Signed)
Pt. In no distress with reports she has had pain in the R leg from the lower R leg to the upper R leg circumferential for almost a week now.  No change in color of skin or pallor.

## 2022-03-21 NOTE — ED Triage Notes (Signed)
Pt c/o right leg pain for about 5 days. Pt reports the pain started behind knee and radiates down, now pain is in upper leg too.

## 2022-04-27 ENCOUNTER — Emergency Department (HOSPITAL_BASED_OUTPATIENT_CLINIC_OR_DEPARTMENT_OTHER): Payer: Commercial Managed Care - PPO

## 2022-04-27 ENCOUNTER — Other Ambulatory Visit: Payer: Self-pay

## 2022-04-27 ENCOUNTER — Emergency Department (HOSPITAL_BASED_OUTPATIENT_CLINIC_OR_DEPARTMENT_OTHER)
Admission: EM | Admit: 2022-04-27 | Discharge: 2022-04-27 | Disposition: A | Discharge status: Home / Self Care | Payer: Commercial Managed Care - PPO | Attending: Emergency Medicine | Admitting: Emergency Medicine

## 2022-04-27 ENCOUNTER — Encounter (HOSPITAL_BASED_OUTPATIENT_CLINIC_OR_DEPARTMENT_OTHER): Payer: Self-pay | Admitting: Urology

## 2022-04-27 DIAGNOSIS — R509 Fever, unspecified: Secondary | ICD-10-CM | POA: Diagnosis not present

## 2022-04-27 DIAGNOSIS — J45909 Unspecified asthma, uncomplicated: Secondary | ICD-10-CM | POA: Insufficient documentation

## 2022-04-27 DIAGNOSIS — E1140 Type 2 diabetes mellitus with diabetic neuropathy, unspecified: Secondary | ICD-10-CM | POA: Insufficient documentation

## 2022-04-27 DIAGNOSIS — R945 Abnormal results of liver function studies: Secondary | ICD-10-CM | POA: Insufficient documentation

## 2022-04-27 DIAGNOSIS — Z20822 Contact with and (suspected) exposure to covid-19: Secondary | ICD-10-CM | POA: Diagnosis not present

## 2022-04-27 DIAGNOSIS — Z794 Long term (current) use of insulin: Secondary | ICD-10-CM | POA: Diagnosis not present

## 2022-04-27 DIAGNOSIS — R5383 Other fatigue: Secondary | ICD-10-CM | POA: Insufficient documentation

## 2022-04-27 DIAGNOSIS — J029 Acute pharyngitis, unspecified: Secondary | ICD-10-CM | POA: Insufficient documentation

## 2022-04-27 DIAGNOSIS — R6889 Other general symptoms and signs: Secondary | ICD-10-CM

## 2022-04-27 DIAGNOSIS — Z8542 Personal history of malignant neoplasm of other parts of uterus: Secondary | ICD-10-CM | POA: Insufficient documentation

## 2022-04-27 DIAGNOSIS — R000 Tachycardia, unspecified: Secondary | ICD-10-CM | POA: Insufficient documentation

## 2022-04-27 DIAGNOSIS — I10 Essential (primary) hypertension: Secondary | ICD-10-CM | POA: Insufficient documentation

## 2022-04-27 DIAGNOSIS — R7989 Other specified abnormal findings of blood chemistry: Secondary | ICD-10-CM

## 2022-04-27 LAB — URINALYSIS, W/ REFLEX TO CULTURE (INFECTION SUSPECTED)
Bilirubin Urine: NEGATIVE
Glucose, UA: NEGATIVE mg/dL
Ketones, ur: NEGATIVE mg/dL
Leukocytes,Ua: NEGATIVE
Protein, ur: 100 mg/dL — AB
Specific Gravity, Urine: 1.025 (ref 1.005–1.030)

## 2022-04-27 LAB — TROPONIN I (HIGH SENSITIVITY)
Troponin I (High Sensitivity): 6 ng/L (ref <18)
Troponin I (High Sensitivity): 6 ng/L (ref <18)

## 2022-04-27 LAB — CBC
HCT: 41.3 % (ref 36.0–46.0)
Hemoglobin: 14.1 g/dL (ref 12.0–15.0)
MCH: 31.6 pg (ref 26.0–34.0)
MCHC: 34.1 g/dL (ref 30.0–36.0)
MCV: 92.6 fL (ref 80.0–100.0)
Platelets: 142 10*3/uL — ABNORMAL LOW (ref 150–400)
RBC: 4.46 MIL/uL (ref 3.87–5.11)
RDW: 12.7 % (ref 11.5–15.5)
WBC: 5.4 10*3/uL (ref 4.0–10.5)
nRBC: 0 % (ref 0.0–0.2)

## 2022-04-27 LAB — BASIC METABOLIC PANEL
Anion gap: 9 (ref 5–15)
BUN: 18 mg/dL (ref 6–20)
CO2: 25 mmol/L (ref 22–32)
Calcium: 9 mg/dL (ref 8.9–10.3)
Chloride: 96 mmol/L — ABNORMAL LOW (ref 98–111)
Creatinine, Ser: 0.97 mg/dL (ref 0.44–1.00)
GFR, Estimated: >60 mL/min (ref >60)
Glucose, Bld: 214 mg/dL — ABNORMAL HIGH (ref 70–99)
Potassium: 4.3 mmol/L (ref 3.5–5.1)
Sodium: 130 mmol/L — ABNORMAL LOW (ref 135–145)

## 2022-04-27 LAB — HEPATIC FUNCTION PANEL
ALT: 230 U/L — ABNORMAL HIGH (ref 0–44)
AST: 266 U/L — ABNORMAL HIGH (ref 15–41)
Albumin: 3.9 g/dL (ref 3.5–5.0)
Alkaline Phosphatase: 234 U/L — ABNORMAL HIGH (ref 38–126)
Bilirubin, Direct: 0.5 mg/dL — ABNORMAL HIGH (ref 0.0–0.2)
Indirect Bilirubin: 0.7 mg/dL (ref 0.3–0.9)
Total Bilirubin: 1.2 mg/dL (ref 0.3–1.2)
Total Protein: 7.4 g/dL (ref 6.5–8.1)

## 2022-04-27 LAB — GROUP A STREP BY PCR: Group A Strep by PCR: NOT DETECTED

## 2022-04-27 LAB — LIPASE, BLOOD: Lipase: 39 U/L (ref 11–51)

## 2022-04-27 LAB — SARS CORONAVIRUS 2 BY RT PCR: SARS Coronavirus 2 by RT PCR: NEGATIVE

## 2022-04-27 LAB — LACTIC ACID, PLASMA: Lactic Acid, Venous: 1.3 mmol/L (ref 0.5–1.9)

## 2022-04-27 MED ORDER — SODIUM CHLORIDE 0.9 % IV BOLUS
1000.0000 mL | Freq: Once | INTRAVENOUS | Status: AC
  Administered 2022-04-27: 1000 mL via INTRAVENOUS

## 2022-04-27 NOTE — ED Triage Notes (Signed)
Pt states woke up this am with bp elevated to 171/139 and HR elevated to 120s Was seen by EMS at home and was told to come to ER if she got worse  For past few days has been nauseated Denies any pain at this time

## 2022-04-27 NOTE — Discharge Instructions (Signed)
You were seen in the emergency department today with flulike symptoms.  Your workup here did not show evidence of sepsis or other severe infection.  We did find that your liver enzymes were slightly elevated but ultrasound was normal.  This may be due from a virus but you do need close follow-up with your primary care doctor for repeat liver labs.  If you develop worsening fever, abdominal pain, other sudden/severe symptoms you should return to the emergency department for reevaluation.

## 2022-04-27 NOTE — ED Provider Notes (Signed)
Emergency Department Provider Note   I have reviewed the triage vital signs and the nursing notes.   HISTORY  Chief Complaint Tachycardia   HPI Kelli Allen is a 47 y.o. female with past history of insulin-dependent diabetes and Cushing's from pituitary lesion presents to the emergency department with intermittent fever, fatigue, elevated heart rate.  Symptoms have been progressing over the past 4 to 5 days.  She initially had headache with some sore throat but headache is resolved after over-the-counter sinus medication.  She continues to have elevated heart rate according to her watch in the 120 range and fatigue.  She describes a mild sore throat.  No rashes.  Denies any dysuria, hesitancy, urgency.  No cough or congestion.  No shortness of breath abdominal pain, vomiting, diarrhea.  She did spend yesterday in the hospital accompanying her father but no severe symptoms at that time. Patient called EMS today and ultimately had a friend drive her to this ED for evaluation.    Past Medical History:  Diagnosis Date   Abnormal Pap smear 2000   Anxiety    Asthma    no issues in years   Cellulitis of lower leg    x3 on right leg   Cushing syndrome 11/26/2013   s/p transsphenoidal hypophysectomy for ACTH secreting pituitary lesion   Cushing's syndrome 09/25/2013   MRI done on 10/02/2013   Diabetes mellitus    Endometrial ca 12/20/2011   Family history of anesthesia complication    sister had hard time waking up and problems breathing   Hypertension    Irregular menstrual bleeding    Neuromuscular disorder    DIABETIC NEUROPATHY   PCOS (polycystic ovarian syndrome)    Tachycardia     Review of Systems  Constitutional: Intermittent fever/chills ENT: Mild sore throat. Cardiovascular: Denies chest pain. Respiratory: Denies shortness of breath. Gastrointestinal: No abdominal pain.  No nausea, no vomiting.  No diarrhea.  No constipation. Genitourinary: Negative for  dysuria. Musculoskeletal: Negative for back pain. Skin: Negative for rash. Neurological: Negative for focal weakness or numbness. HA earlier in the week which has resolved.    ____________________________________________   PHYSICAL EXAM:  VITAL SIGNS: ED Triage Vitals  Enc Vitals Group     BP 04/27/22 1347 (!) 146/80     Pulse Rate 04/27/22 1347 (!) 124     Resp 04/27/22 1347 (!) 24     Temp 04/27/22 1347 99.8 F (37.7 C)     Temp Source 04/27/22 1347 Oral     SpO2 04/27/22 1347 95 %     Weight 04/27/22 1350 285 lb 0.9 oz (129.3 kg)     Height 04/27/22 1350  (1.651 m)   Constitutional: Alert and oriented. Well appearing and in no acute distress. Eyes: Conjunctivae are normal.  Head: Atraumatic. Nose: No congestion/rhinnorhea. Mouth/Throat: Mucous membranes are moist.  Oropharynx with mild erythema.  Neck: No stridor.  Cardiovascular: Tachycardia. Good peripheral circulation. Grossly normal heart sounds.   Respiratory: Normal respiratory effort.  No retractions. Lungs CTAB. Gastrointestinal: Soft and nontender. No distention.  Musculoskeletal: No lower extremity tenderness nor edema. No gross deformities of extremities. Neurologic:  Normal speech and language.  Skin:  Skin is warm, dry and intact. No rash noted.  ____________________________________________   LABS (all labs ordered are listed, but only abnormal results are displayed)  Labs Reviewed  BASIC METABOLIC PANEL - Abnormal; Notable for the following components:      Result Value   Sodium 130 (*)  Chloride 96 (*)    Glucose, Bld 214 (*)    All other components within normal limits  CBC - Abnormal; Notable for the following components:   Platelets 142 (*)    All other components within normal limits  HEPATIC FUNCTION PANEL - Abnormal; Notable for the following components:   AST 266 (*)    ALT 230 (*)    Alkaline Phosphatase 234 (*)    Bilirubin, Direct 0.5 (*)    All other components within normal  limits  URINALYSIS, W/ REFLEX TO CULTURE (INFECTION SUSPECTED) - Abnormal; Notable for the following components:   Protein, ur 100 (*)    Bacteria, UA RARE (*)    All other components within normal limits  SARS CORONAVIRUS 2 BY RT PCR  GROUP A STREP BY PCR  LIPASE, BLOOD  LACTIC ACID, PLASMA  TROPONIN I (HIGH SENSITIVITY)  TROPONIN I (HIGH SENSITIVITY)   ____________________________________________  EKG   EKG Interpretation  Date/Time:  Thursday April 27 2022 13:54:55 EDT Ventricular Rate:  130 PR Interval:  146 QRS Duration: 81 QT Interval:  277 QTC Calculation: 408 R Axis:   26 Text Interpretation: Sinus tachycardia Confirmed by Alona Bene [phone]) on 04/27/2022 3:23:21 PM        ____________________________________________  RADIOLOGY  DG Chest 2 View  Result Date: 04/27/2022 CLINICAL DATA:  Tachycardia. EXAM: CHEST - 2 VIEW COMPARISON:  05/09/2021. FINDINGS: Clear lungs. Normal heart size and mediastinal contours. No pleural effusion or pneumothorax. Visualized bones and upper abdomen are unremarkable. IMPRESSION: No evidence of acute cardiopulmonary disease. Electronically Signed   By: Orvan Falconer M.D.   On: 04/27/2022 14:43    ____________________________________________   PROCEDURES  Procedure(s) performed:   Procedures   ____________________________________________   INITIAL IMPRESSION / ASSESSMENT AND PLAN / ED COURSE  Pertinent labs & imaging results that were available during my care of the patient were reviewed by me and considered in my medical decision making (see chart for details).   This patient is Presenting for Evaluation of tachycardia, which does require a range of treatment options, and is a complaint that involves a high risk of morbidity and mortality.  The Differential Diagnoses include viral infection, sepsis, dehydration, DKA, etc.  Critical Interventions-    Medications  sodium chloride 0.9 % bolus 1,000 mL (0 mLs  Intravenous Stopped 04/27/22 1901)    Reassessment after intervention:  Tachycardia improved.    Clinical Laboratory Tests Ordered, included troponin normal. No AKI. Hyperglycemia without DKA. No leukocytosis.   Radiologic Tests Ordered, included CXR. I independently interpreted the images and agree with radiology interpretation.   Cardiac Monitor Tracing which shows sinus tachycardia.    Social Determinants of Health Risk patient is a non-smoker.    Medical Decision Making: Summary:  Patient presents emergency department for evaluation of tachycardia with fatigue and intermittent fever.  Arrives here afebrile but tachycardic to the 120s with sinus rhythm noted on EKG.  No acute ischemia.  Low suspicion for PE although considered without chest pain and normal troponin.  Symptoms more consistent with likely viral infection.  Lower concern for sepsis but will obtain a single lactic acid given persistent tachycardia.  Exam, EKG, presentation not consistent with myocarditis.  Plan for IV fluids and reassess.   Reevaluation with update and discussion with patient. LFTs are mildly elevated but bilirubin normal. No acute cholecystitis or cholelithiasis on [place]. Clinically, patient does not have ascending cholangitis. Will need close follow up of her LFTs and hepatitis viral panel (  send-out lab).   Considered admission but patient feeling well. Stable for outpatient follow up. Vital have normalized.   Patient's presentation is most consistent with acute presentation with potential threat to life or bodily function.   Disposition: discharge  ____________________________________________  FINAL CLINICAL IMPRESSION(S) / ED DIAGNOSES  Final diagnoses:  Tachycardia  Flu-like symptoms  Elevated LFTs    Note:  This document was prepared using Dragon voice recognition software and may include unintentional dictation errors.  Alona Bene, MD, [facility]    Chrishawn Kring, Arlyss Repress,  MD 04/28/22 [phone]

## 2022-05-02 LAB — URINALYSIS, W/ REFLEX TO CULTURE (INFECTION SUSPECTED)
Hgb urine dipstick: NEGATIVE
Nitrite: NEGATIVE
pH: 5.5 (ref 5.0–8.0)
